# Patient Record
Sex: Male | Born: 1950
Health system: Southern US, Community
[De-identification: ages and names within clinical notes are randomized; demographics above are authoritative.]

## PROBLEM LIST (undated history)

## (undated) DIAGNOSIS — I10 Essential (primary) hypertension: Secondary | ICD-10-CM

## (undated) DIAGNOSIS — G4733 Obstructive sleep apnea (adult) (pediatric): Secondary | ICD-10-CM

## (undated) DIAGNOSIS — E785 Hyperlipidemia, unspecified: Secondary | ICD-10-CM

## (undated) DIAGNOSIS — I2699 Other pulmonary embolism without acute cor pulmonale: Secondary | ICD-10-CM

## (undated) DIAGNOSIS — Z8489 Family history of other specified conditions: Secondary | ICD-10-CM

## (undated) DIAGNOSIS — I499 Cardiac arrhythmia, unspecified: Secondary | ICD-10-CM

## (undated) DIAGNOSIS — C801 Malignant (primary) neoplasm, unspecified: Secondary | ICD-10-CM

## (undated) DIAGNOSIS — M47816 Spondylosis without myelopathy or radiculopathy, lumbar region: Secondary | ICD-10-CM

## (undated) DIAGNOSIS — H409 Unspecified glaucoma: Secondary | ICD-10-CM

## (undated) DIAGNOSIS — R7301 Impaired fasting glucose: Secondary | ICD-10-CM

## (undated) DIAGNOSIS — E7800 Pure hypercholesterolemia, unspecified: Secondary | ICD-10-CM

## (undated) DIAGNOSIS — I82409 Acute embolism and thrombosis of unspecified deep veins of unspecified lower extremity: Secondary | ICD-10-CM

## (undated) HISTORY — DX: Impaired fasting glucose: R73.01

## (undated) HISTORY — DX: Acute embolism and thrombosis of unspecified deep veins of unspecified lower extremity: I82.409

## (undated) HISTORY — DX: Spondylosis without myelopathy or radiculopathy, lumbar region: M47.816

## (undated) HISTORY — DX: Unspecified glaucoma: H40.9

## (undated) HISTORY — DX: Hyperlipidemia, unspecified: E78.5

## (undated) HISTORY — DX: Obstructive sleep apnea (adult) (pediatric): G47.33

## (undated) HISTORY — DX: Other pulmonary embolism without acute cor pulmonale: I26.99

## (undated) HISTORY — PX: KNEE SURGERY: SHX244

---

## 1994-10-16 HISTORY — PX: TOTAL KNEE ARTHROPLASTY: SHX125

## 2000-11-16 ENCOUNTER — Encounter: Admission: RE | Admit: 2000-11-16 | Discharge: 2000-11-16 | Payer: Self-pay | Admitting: Family Medicine

## 2000-11-16 ENCOUNTER — Encounter: Payer: Self-pay | Admitting: Family Medicine

## 2000-12-27 ENCOUNTER — Encounter: Payer: Self-pay | Admitting: Family Medicine

## 2000-12-27 ENCOUNTER — Encounter: Admission: RE | Admit: 2000-12-27 | Discharge: 2000-12-27 | Payer: Self-pay | Admitting: Family Medicine

## 2003-11-27 ENCOUNTER — Ambulatory Visit (HOSPITAL_COMMUNITY): Admission: RE | Admit: 2003-11-27 | Discharge: 2003-11-27 | Payer: Self-pay | Admitting: Family Medicine

## 2005-07-13 ENCOUNTER — Inpatient Hospital Stay (HOSPITAL_COMMUNITY): Admission: EM | Admit: 2005-07-13 | Discharge: 2005-07-17 | Payer: Self-pay | Admitting: Emergency Medicine

## 2005-07-13 ENCOUNTER — Encounter (INDEPENDENT_AMBULATORY_CARE_PROVIDER_SITE_OTHER): Payer: Self-pay | Admitting: *Deleted

## 2007-03-17 ENCOUNTER — Emergency Department (HOSPITAL_COMMUNITY): Admission: EM | Admit: 2007-03-17 | Discharge: 2007-03-17 | Payer: Self-pay | Admitting: Family Medicine

## 2011-03-03 NOTE — Discharge Summary (Signed)
NAMECAYCE, QUEZADA                 ACCOUNT NO.:  [id number]   MEDICAL RECORD NO.:  [id number]          PATIENT TYPE:  INP   LOCATION:  3009                         FACILITY:  [facility]   PHYSICIAN:  Corinna L. Lendell Caprice, MDDATE OF BIRTH:  04-17-1951   DATE OF ADMISSION:  07/13/2005  DATE OF DISCHARGE:  07/17/2005                                 DISCHARGE SUMMARY   DIAGNOSES:  1.  Acute bilateral pulmonary emboli.  2.  History of deep vein thrombosis.  3.  Osteoarthritis of the knees.   DISCHARGE MEDICATIONS:  1.  Lovenox 150 mg subcutaneously daily until his INR is greater than or      equal to 2.0.  2.  Coumadin 5 mg a day.  He is to followup with Donia Guiles, M.D. within      a week, and he will need daily INRs until his INR is greater than or      equal to 2.0. His INR today is 1.9.  Coumadin management per Dr.      Arvilla Market.   ACTIVITY:  No heavy exertion for two weeks.   CONDITION:  Stable.   CONSULTATIONS:  None.   PROCEDURES:  None.   DIET:  Coumadin friendly.   PERTINENT LABORATORY DATA:  On admission, CBC, PT, and PTT were normal.  Basic metabolic panel normal except for a glucose of 160.  At discharge, his  INR was 1.9.  His D-dimer was 7.61.  Lupus anticoagulant not detected.  PSA  1.06. Liver function tests essentially normal.  Anticardiolipin antibody  negative.  Homocystine normal.  Factor V Leiden is pending.   SPECIAL STUDIES IN RADIOLOGY:  A CT of the chest showed extensive bilateral  pulmonary emboli with right heart strain, suspicion for early right upper  lobe infarct. Chest x-ray showed borderline cardiomegaly, nothing acute.  Echocardiogram showed normal left ventricular ejection fraction but  dilatation of the right ventricle and reduced right ventricular systolic  function with elevated estimated peak right ventricular systolic pressure of  55 mmHg, mild-to-moderate tricuspid regurgitation, and dilated right atrium.  The EKG showed sinus  tachycardia.   HISTORY AND HOSPITAL COURSE:  [name] is a 60 year old white male who had a  syncopal episode. Please see H&P for complete details. He was hypoxic, and  per his wife.  He did not appear to be breathing for several seconds at a  time when she was waiting for EMS.  His oxygen saturation was 80%, I  believe, on non-rebreather in the emergency room initially;  however, this  came up eventually.  He had some moderate respiratory distress but had no  wheezes, rhonchi, or rales. He was tachycardic.  The CT of the chest showed  right heart strain and large bilateral pulmonary emboli.  The ER physician  spoke with critical care medicine.  They did not recommend thrombolytics as  he was able to oxygenate, and his vital signs were fairly stable. He was  admitted to the intensive care unit. started on heparin drip and Coumadin,  and given oxygen.  His hypoxia resolved fairly quickly. He was  back to his  baseline. He had a history of DVT in the past and did not have any recent  risk factors for pulmonary embolus. Upon further questioning, he reports  that his sister and niece have factor V Leiden deficiency and/or protein C  deficiency.  His hypercoagulable workup has been negative to date, but a  factor V Leiden was not ordered. Also, I did not order protein C, protein S,  or antithrombin-III levels because he had already been started on heparin,  and results would not be reliable. He reported that he had not had a  screening colonoscopy.  That would be another thing to consider once his PEs  resolve to rule out occult malignancy.  His PSA here was normal. As he had  this second thrombotic event, he will need to be on life-long Coumadin  today. His Coumadin and near therapeutic range. He is very anxious to go  home. He knows how to inject Lovenox from his previous DVT. As his vital  signs are stable and as he is ambulating without difficulty and back to his  baseline, I feel that it is  fine to let him go home which he is requesting.  I have discussed the case with Dr. Arvilla Market so that he may have close  followup, particularly of his daily INRs and Coumadin doses.      Corinna L. Lendell Caprice, MD  Electronically Signed     CLS/MEDQ  D:  07/17/2005  T:  07/17/2005  Job:  [id number]   cc:   Donia Guiles, M.D.  Fax: [phone]

## 2011-03-03 NOTE — H&P (Signed)
NAMECOLSEN, MODI                 ACCOUNT NO.:  [id number]   MEDICAL RECORD NO.:  [id number]          PATIENT TYPE:  INP   LOCATION:  2912                         FACILITY:  [facility]   PHYSICIAN:  Corinna L. Lendell Caprice, MDDATE OF BIRTH:  Feb 22, 1951   DATE OF ADMISSION:  07/13/2005  DATE OF DISCHARGE:                                HISTORY & PHYSICAL   CHIEF COMPLAINT:  Passed out.   HISTORY OF PRESENT ILLNESS:  Mr. Manetta is a 60 year old white male with a  history of previous deep venous thrombosis who presents to the emergency  room with a syncopal episode.  Apparently it lasted several minutes.  His  wife called 911 and he began waking up when EMS arrived.  The wife reports  that he had periods of apnea while she was waiting for EMS.  Patient was  hypoxic with saturations in the 80s.  She reports that he was pale and  blue.  He does complain of shortness of breath.  He has no chest pain.  He  reports that he has had some dyspnea on exertion over the past several  weeks.  A few months ago he started having leg pains, but reports it went  away with exercise.  His legs were also swollen.  He was not concerned,  however, because he felt this was related to his knee problems.  He had deep  venous thrombosis a few years ago and received six months of Coumadin.  He  initially reported that there is no family history of thromboembolism or  clotting disorder; however, after the encounter was completed, he reported  that his niece and aunt have clotting problems, specifically protein C  deficiency and factor V Leiden deficiency.  He denies any bleeding ulcers.  He has not been screened for colon or prostate cancer.  He does not smoke.  No recent prolonged car or plane trips.  No recent immobilization.   PAST MEDICAL HISTORY:  As above and also osteoarthritis of the knee for  which he has had surgery.   MEDICATIONS:  None.   ALLERGIES:  PENICILLIN.   SOCIAL HISTORY:  Patient drinks  occasionally.  He does not smoke.  He is  here with his wife.  He is retired and owns a gym.   FAMILY HISTORY:  As above.   REVIEW OF SYSTEMS:  As above, otherwise negative.   PHYSICAL EXAMINATION:  VITAL SIGNS:  Temperature 97.1, blood pressure 99/60,  pulse 114, respiratory rate 24, oxygen saturation 80% I believe on  nonrebreather when he first came in.  However, he is 100% on nonrebreather  currently.  GENERAL:  Patient is able to speak in short sentences only.  He appears in  moderate respiratory distress.  HEENT:  Normocephalic, atraumatic.  Pupils are equal, round, and reactive to  light.  Sclerae non-icteric.  Moist mucous membranes.  He does have central  cyanosis when he takes off his oxygen mask.  NECK:  Supple.  No carotid bruits.  LUNGS:  Clear to auscultation bilaterally without wheezes, rhonchi, or  rales.  CARDIOVASCULAR:  Regular rate  and rhythm without murmurs, rubs, or gallops.  ABDOMEN:  Normal bowel sounds, soft, nontender, nondistended.  GENITOURINARY:  Deferred.  RECTAL:  Deferred.  EXTREMITIES:  No clubbing, cyanosis, edema.  Homan's sign negative.  No calf  tenderness.  No cords.  NEUROLOGIC:  Patient is alert and oriented.  Cranial nerves and sensory  motor examination were intact.  SKIN:  No rash.   LABORATORIES:  pH was 7.429, pCO2 was 30, bicarbonate 20.  CBC was  unremarkable.  PT/PTT normal.  D-dimer 7.61.  Basic metabolic panel  significant for a glucose of 160, otherwise unremarkable.  EKG shows sinus  tachycardia with a rate of 110.  Chest x-ray shows nothing acute.  CAT scan  of the chest shows extensive bilateral pulmonary emboli with evidence of  right heart strain, possibly early infarct in the right upper lobe,  pulmonary emboli involving the main pulmonary arteries greater on the right  than the left, extension into all lobar branches on the right, less severe  extension into the lobar and segmental branches on the left, suspicion for   right heart strain with intraventricular septal deviation leftward.   ASSESSMENT/PLAN:  Massive bilateral pulmonary emboli with significant  hypoxia.  Currently patient is oxygenating better and does not need to be  intubated.  Dr. Denton Lank discussed the case with Dr. Solon Augusta of critical care.  He reports that as long as he is able to oxygenate TPA is not needed.  Due  to the significant hypoxia and extensive nature of the emboli, patient will  be admitted to the intensive care unit.  I will continue oxygen.  He has  been started on a heparin drip which I will continue.  I will ask pharmacy  to dose Coumadin.  He will need lifelong Coumadin therapy as this is his  second thrombotic event.  Since heparin has already been started I will not  order protein C, protein S, or antithrombin III levels.  However, I will  order a homocysteine level, factor V Leiden, antiphospholipid antibodies,  and PCR for D2O210A prothrombin gene mutation.  The ER physician has ordered  an echocardiogram due to the right heart strain.  The results are pending.  Patient also will need a colonoscopy as an outpatient for screening.  While  here I will check a PSA and Hemoccult stools as well.      Corinna L. Lendell Caprice, MD  Electronically Signed     CLS/MEDQ  D:  07/14/2005  T:  07/14/2005  Job:  [id number]   cc:   Donia Guiles, M.D.  Fax: [phone]

## 2013-12-16 ENCOUNTER — Encounter: Payer: Self-pay | Admitting: General Surgery

## 2013-12-16 DIAGNOSIS — R0602 Shortness of breath: Secondary | ICD-10-CM | POA: Insufficient documentation

## 2013-12-16 DIAGNOSIS — G4733 Obstructive sleep apnea (adult) (pediatric): Secondary | ICD-10-CM | POA: Insufficient documentation

## 2013-12-17 ENCOUNTER — Ambulatory Visit (INDEPENDENT_AMBULATORY_CARE_PROVIDER_SITE_OTHER): Payer: 59 | Admitting: Cardiology

## 2013-12-17 ENCOUNTER — Encounter: Payer: Self-pay | Admitting: Cardiology

## 2013-12-17 VITALS — BP 132/82 | HR 79 | Ht 70.0 in | Wt 244.0 lb

## 2013-12-17 DIAGNOSIS — R0602 Shortness of breath: Secondary | ICD-10-CM

## 2013-12-17 DIAGNOSIS — G4733 Obstructive sleep apnea (adult) (pediatric): Secondary | ICD-10-CM

## 2013-12-17 NOTE — Patient Instructions (Signed)
Your physician recommends that you continue on your current medications as directed. Please refer to the Current Medication list given to you today.  Your physician wants you to follow-up in: 6 months with Dr Turner You will receive a reminder letter in the mail two months in advance. If you don't receive a letter, please call our office to schedule the follow-up appointment.  

## 2013-12-17 NOTE — Progress Notes (Signed)
[place], [address] [place], [place]  [address] Phone: [phone] Fax:  [phone]  Date:  12/17/2013   ID:  Jacody, Beneke 04/01/1951, MRN [id number]  PCP:  Mayra Neer, MD  Sleep Medicine:  Fransico Him, MD     History of Present Illness: Patrick Fritz is a 63 y.o. male with a history of OSA and obesity who presents today for followup.  He is doing well.  He tolerates his CPAP device without any problems.  He feels rested in the am and has no daytime sleepiness.  He tolerates his full face mask and feels the pressure is adequate.  When I saw him last he was complaining of SOB and a 2D echo was done which showed normal LVF, mild MR, mildly dilated aortic root and nuclear stress test showed no ischemia. He recently had a cold so he did not use his CPAP for a few nights.   Wt Readings from Last 3 Encounters:  12/17/13 244 lb (110.678 kg)     Past Medical History  Diagnosis Date  . DVT (deep venous thrombosis)   . Hyperlipidemia   . PE (pulmonary embolism)     Second occurance,enknown etiology,lifelong anticoagulation  . IFG (impaired fasting glucose)   . Glaucoma     R eye diminished vision approx 50%  . OSA (obstructive sleep apnea)     Severe w AHI 34/hr on HST no on BiPAP at 19/15cm H2O  . DJD (degenerative joint disease), lumbar     Current Outpatient Prescriptions  Medication Sig Dispense Refill  . amoxicillin (AMOXIL) 500 MG capsule       . cetirizine (ZYRTEC) 10 MG tablet Take 10 mg by mouth daily.      . cholecalciferol (VITAMIN D) 1000 UNITS tablet Take 1,000 Units by mouth daily.      . NON FORMULARY BiPAP      . Omega-3 Fatty Acids (FISH OIL) 1000 MG CAPS Take 1 capsule by mouth 2 (two) times daily.      . Rivaroxaban (XARELTO) 20 MG TABS tablet Take 20 mg by mouth daily with supper.      . sildenafil (VIAGRA) 100 MG tablet Take 100 mg by mouth daily as needed for erectile dysfunction.      . simvastatin (ZOCOR) 20 MG tablet Take 20 mg by mouth  daily.      . VOLTAREN 1 % GEL        No current facility-administered medications for this visit.    Allergies:    Allergies  Allergen Reactions  . Penicillins     Throat Swelling    Social History:  The patient  reports that he has never smoked. He does not have any smokeless tobacco history on file. He reports that he drinks alcohol.   Family History:  The patient's family history includes CAD in his father; Hypertension in his father; Osteoporosis in his mother.   ROS:  Please see the history of present illness.      All other systems reviewed and negative.   PHYSICAL EXAM: VS:  BP 132/82  Pulse 79  Ht 5\' 10"  (1.778 m)  Wt 244 lb (110.678 kg)  BMI 35.01 kg/m2  SpO2 99% Well nourished, well developed, in no acute distress HEENT: normal Neck: no JVD Cardiac:  normal S1, S2; RRR; no murmur Lungs:  clear to auscultation bilaterally, no wheezing, rhonchi or rales Abd: soft, nontender, no hepatomegaly Ext: no edema Skin: warm and dry Neuro:  CNs 2-12 intact, no focal abnormalities noted       ASSESSMENT AND PLAN:  1. OSA on CPAP and tolerating well  - I will get a download from his DME 2. Obesity - I have encouarged him to try to get into a routine walking program  exercise and strict diet to try to lose some weight. 3. Chronic SOB with normal cardiac workup most likely secondary to morbid obesity  Followup with me in 6 months  Signed, Fransico Him, MD 12/17/2013 10:51 AM

## 2014-01-12 ENCOUNTER — Encounter: Payer: Self-pay | Admitting: Cardiology

## 2014-01-12 ENCOUNTER — Encounter: Payer: Self-pay | Admitting: General Surgery

## 2014-02-13 DIAGNOSIS — H401290 Low-tension glaucoma, unspecified eye, stage unspecified: Secondary | ICD-10-CM | POA: Diagnosis not present

## 2014-03-16 DIAGNOSIS — W57XXXA Bitten or stung by nonvenomous insect and other nonvenomous arthropods, initial encounter: Secondary | ICD-10-CM | POA: Diagnosis not present

## 2014-03-16 DIAGNOSIS — Z6836 Body mass index (BMI) 36.0-36.9, adult: Secondary | ICD-10-CM | POA: Diagnosis not present

## 2014-03-16 DIAGNOSIS — Z23 Encounter for immunization: Secondary | ICD-10-CM | POA: Diagnosis not present

## 2014-03-16 DIAGNOSIS — N529 Male erectile dysfunction, unspecified: Secondary | ICD-10-CM | POA: Diagnosis not present

## 2014-03-16 DIAGNOSIS — Z125 Encounter for screening for malignant neoplasm of prostate: Secondary | ICD-10-CM | POA: Diagnosis not present

## 2014-03-16 DIAGNOSIS — R7301 Impaired fasting glucose: Secondary | ICD-10-CM | POA: Diagnosis not present

## 2014-03-16 DIAGNOSIS — R030 Elevated blood-pressure reading, without diagnosis of hypertension: Secondary | ICD-10-CM | POA: Diagnosis not present

## 2014-03-16 DIAGNOSIS — M25519 Pain in unspecified shoulder: Secondary | ICD-10-CM | POA: Diagnosis not present

## 2014-03-16 DIAGNOSIS — T148 Other injury of unspecified body region: Secondary | ICD-10-CM | POA: Diagnosis not present

## 2014-03-16 DIAGNOSIS — Z0000 Encounter for general adult medical examination without abnormal findings: Secondary | ICD-10-CM | POA: Diagnosis not present

## 2014-03-16 DIAGNOSIS — E782 Mixed hyperlipidemia: Secondary | ICD-10-CM | POA: Diagnosis not present

## 2014-03-17 ENCOUNTER — Other Ambulatory Visit: Payer: Self-pay | Admitting: Family Medicine

## 2014-03-17 DIAGNOSIS — Z139 Encounter for screening, unspecified: Secondary | ICD-10-CM

## 2014-03-19 ENCOUNTER — Ambulatory Visit
Admission: RE | Admit: 2014-03-19 | Discharge: 2014-03-19 | Disposition: A | Discharge status: Home / Self Care | Payer: Medicare Other | Source: Ambulatory Visit | Attending: Family Medicine | Admitting: Family Medicine

## 2014-03-19 DIAGNOSIS — Z139 Encounter for screening, unspecified: Secondary | ICD-10-CM

## 2014-03-19 DIAGNOSIS — Z136 Encounter for screening for cardiovascular disorders: Secondary | ICD-10-CM | POA: Diagnosis not present

## 2014-05-08 DIAGNOSIS — Z86010 Personal history of colonic polyps: Secondary | ICD-10-CM | POA: Diagnosis not present

## 2014-05-08 DIAGNOSIS — Z09 Encounter for follow-up examination after completed treatment for conditions other than malignant neoplasm: Secondary | ICD-10-CM | POA: Diagnosis not present

## 2014-05-19 DIAGNOSIS — H401290 Low-tension glaucoma, unspecified eye, stage unspecified: Secondary | ICD-10-CM | POA: Diagnosis not present

## 2014-08-25 DIAGNOSIS — H401232 Low-tension glaucoma, bilateral, moderate stage: Secondary | ICD-10-CM | POA: Diagnosis not present

## 2014-09-01 DIAGNOSIS — H1013 Acute atopic conjunctivitis, bilateral: Secondary | ICD-10-CM | POA: Diagnosis not present

## 2014-12-08 DIAGNOSIS — H401232 Low-tension glaucoma, bilateral, moderate stage: Secondary | ICD-10-CM | POA: Diagnosis not present

## 2015-03-09 DIAGNOSIS — H401231 Low-tension glaucoma, bilateral, mild stage: Secondary | ICD-10-CM | POA: Diagnosis not present

## 2015-04-14 DIAGNOSIS — M129 Arthropathy, unspecified: Secondary | ICD-10-CM | POA: Diagnosis not present

## 2015-04-14 DIAGNOSIS — J301 Allergic rhinitis due to pollen: Secondary | ICD-10-CM | POA: Diagnosis not present

## 2015-04-14 DIAGNOSIS — Z86711 Personal history of pulmonary embolism: Secondary | ICD-10-CM | POA: Diagnosis not present

## 2015-04-14 DIAGNOSIS — N529 Male erectile dysfunction, unspecified: Secondary | ICD-10-CM | POA: Diagnosis not present

## 2015-04-14 DIAGNOSIS — Z6835 Body mass index (BMI) 35.0-35.9, adult: Secondary | ICD-10-CM | POA: Diagnosis not present

## 2015-04-14 DIAGNOSIS — E780 Pure hypercholesterolemia: Secondary | ICD-10-CM | POA: Diagnosis not present

## 2015-04-14 DIAGNOSIS — Z125 Encounter for screening for malignant neoplasm of prostate: Secondary | ICD-10-CM | POA: Diagnosis not present

## 2015-04-14 DIAGNOSIS — G4733 Obstructive sleep apnea (adult) (pediatric): Secondary | ICD-10-CM | POA: Diagnosis not present

## 2015-04-14 DIAGNOSIS — R7301 Impaired fasting glucose: Secondary | ICD-10-CM | POA: Diagnosis not present

## 2015-04-14 DIAGNOSIS — Z0001 Encounter for general adult medical examination with abnormal findings: Secondary | ICD-10-CM | POA: Diagnosis not present

## 2015-04-22 DIAGNOSIS — R5383 Other fatigue: Secondary | ICD-10-CM | POA: Diagnosis not present

## 2015-04-22 DIAGNOSIS — M19042 Primary osteoarthritis, left hand: Secondary | ICD-10-CM | POA: Diagnosis not present

## 2015-04-22 DIAGNOSIS — M79643 Pain in unspecified hand: Secondary | ICD-10-CM | POA: Diagnosis not present

## 2015-04-22 DIAGNOSIS — M19041 Primary osteoarthritis, right hand: Secondary | ICD-10-CM | POA: Diagnosis not present

## 2015-04-22 DIAGNOSIS — R760 Raised antibody titer: Secondary | ICD-10-CM | POA: Diagnosis not present

## 2015-05-18 DIAGNOSIS — E790 Hyperuricemia without signs of inflammatory arthritis and tophaceous disease: Secondary | ICD-10-CM | POA: Diagnosis not present

## 2015-05-18 DIAGNOSIS — M79643 Pain in unspecified hand: Secondary | ICD-10-CM | POA: Diagnosis not present

## 2015-05-18 DIAGNOSIS — M351 Other overlap syndromes: Secondary | ICD-10-CM | POA: Diagnosis not present

## 2015-06-08 DIAGNOSIS — H401231 Low-tension glaucoma, bilateral, mild stage: Secondary | ICD-10-CM | POA: Diagnosis not present

## 2015-08-18 DIAGNOSIS — M79643 Pain in unspecified hand: Secondary | ICD-10-CM | POA: Diagnosis not present

## 2015-08-18 DIAGNOSIS — M130 Polyarthritis, unspecified: Secondary | ICD-10-CM | POA: Diagnosis not present

## 2015-08-18 DIAGNOSIS — M351 Other overlap syndromes: Secondary | ICD-10-CM | POA: Diagnosis not present

## 2015-08-18 DIAGNOSIS — E790 Hyperuricemia without signs of inflammatory arthritis and tophaceous disease: Secondary | ICD-10-CM | POA: Diagnosis not present

## 2015-08-31 DIAGNOSIS — H401231 Low-tension glaucoma, bilateral, mild stage: Secondary | ICD-10-CM | POA: Diagnosis not present

## 2015-09-17 DIAGNOSIS — M79643 Pain in unspecified hand: Secondary | ICD-10-CM | POA: Diagnosis not present

## 2015-09-17 DIAGNOSIS — M351 Other overlap syndromes: Secondary | ICD-10-CM | POA: Diagnosis not present

## 2015-09-17 DIAGNOSIS — E790 Hyperuricemia without signs of inflammatory arthritis and tophaceous disease: Secondary | ICD-10-CM | POA: Diagnosis not present

## 2015-09-17 DIAGNOSIS — M130 Polyarthritis, unspecified: Secondary | ICD-10-CM | POA: Diagnosis not present

## 2015-11-30 DIAGNOSIS — H401231 Low-tension glaucoma, bilateral, mild stage: Secondary | ICD-10-CM | POA: Diagnosis not present

## 2016-02-22 DIAGNOSIS — H401231 Low-tension glaucoma, bilateral, mild stage: Secondary | ICD-10-CM | POA: Diagnosis not present

## 2016-03-17 DIAGNOSIS — E790 Hyperuricemia without signs of inflammatory arthritis and tophaceous disease: Secondary | ICD-10-CM | POA: Diagnosis not present

## 2016-03-17 DIAGNOSIS — M79643 Pain in unspecified hand: Secondary | ICD-10-CM | POA: Diagnosis not present

## 2016-03-17 DIAGNOSIS — M130 Polyarthritis, unspecified: Secondary | ICD-10-CM | POA: Diagnosis not present

## 2016-03-17 DIAGNOSIS — M351 Other overlap syndromes: Secondary | ICD-10-CM | POA: Diagnosis not present

## 2016-05-14 ENCOUNTER — Other Ambulatory Visit (HOSPITAL_COMMUNITY): Payer: Self-pay

## 2016-05-14 ENCOUNTER — Emergency Department (HOSPITAL_COMMUNITY): Payer: Medicare Other | Admitting: Anesthesiology

## 2016-05-14 ENCOUNTER — Encounter (HOSPITAL_COMMUNITY): Payer: Self-pay

## 2016-05-14 ENCOUNTER — Encounter (HOSPITAL_COMMUNITY)
Admission: EM | Disposition: A | Discharge status: Home / Self Care | Payer: Self-pay | Source: Home / Self Care | Attending: Cardiothoracic Surgery

## 2016-05-14 ENCOUNTER — Inpatient Hospital Stay (HOSPITAL_COMMUNITY)
Admission: EM | Admit: 2016-05-14 | Discharge: 2016-05-23 | DRG: 231 | Disposition: A | Discharge status: Home / Self Care | Payer: Medicare Other | Attending: Cardiothoracic Surgery | Admitting: Cardiothoracic Surgery

## 2016-05-14 ENCOUNTER — Emergency Department (HOSPITAL_COMMUNITY): Payer: Medicare Other

## 2016-05-14 DIAGNOSIS — E785 Hyperlipidemia, unspecified: Secondary | ICD-10-CM | POA: Diagnosis not present

## 2016-05-14 DIAGNOSIS — Z7901 Long term (current) use of anticoagulants: Secondary | ICD-10-CM

## 2016-05-14 DIAGNOSIS — I2542 Coronary artery dissection: Secondary | ICD-10-CM | POA: Diagnosis present

## 2016-05-14 DIAGNOSIS — I110 Hypertensive heart disease with heart failure: Secondary | ICD-10-CM | POA: Diagnosis present

## 2016-05-14 DIAGNOSIS — I213 ST elevation (STEMI) myocardial infarction of unspecified site: Secondary | ICD-10-CM | POA: Diagnosis not present

## 2016-05-14 DIAGNOSIS — J939 Pneumothorax, unspecified: Secondary | ICD-10-CM

## 2016-05-14 DIAGNOSIS — D696 Thrombocytopenia, unspecified: Secondary | ICD-10-CM | POA: Diagnosis not present

## 2016-05-14 DIAGNOSIS — D62 Acute posthemorrhagic anemia: Secondary | ICD-10-CM | POA: Diagnosis not present

## 2016-05-14 DIAGNOSIS — Y840 Cardiac catheterization as the cause of abnormal reaction of the patient, or of later complication, without mention of misadventure at the time of the procedure: Secondary | ICD-10-CM | POA: Diagnosis not present

## 2016-05-14 DIAGNOSIS — I7101 Dissection of thoracic aorta: Secondary | ICD-10-CM | POA: Diagnosis not present

## 2016-05-14 DIAGNOSIS — I7100 Dissection of unspecified site of aorta: Secondary | ICD-10-CM

## 2016-05-14 DIAGNOSIS — I2111 ST elevation (STEMI) myocardial infarction involving right coronary artery: Secondary | ICD-10-CM

## 2016-05-14 DIAGNOSIS — I9751 Accidental puncture and laceration of a circulatory system organ or structure during a circulatory system procedure: Secondary | ICD-10-CM | POA: Diagnosis not present

## 2016-05-14 DIAGNOSIS — E662 Morbid (severe) obesity with alveolar hypoventilation: Secondary | ICD-10-CM | POA: Diagnosis not present

## 2016-05-14 DIAGNOSIS — Z95828 Presence of other vascular implants and grafts: Secondary | ICD-10-CM

## 2016-05-14 DIAGNOSIS — Z6837 Body mass index (BMI) 37.0-37.9, adult: Secondary | ICD-10-CM | POA: Diagnosis not present

## 2016-05-14 DIAGNOSIS — R0789 Other chest pain: Secondary | ICD-10-CM | POA: Diagnosis not present

## 2016-05-14 DIAGNOSIS — I5043 Acute on chronic combined systolic (congestive) and diastolic (congestive) heart failure: Secondary | ICD-10-CM | POA: Diagnosis present

## 2016-05-14 DIAGNOSIS — J9811 Atelectasis: Secondary | ICD-10-CM

## 2016-05-14 DIAGNOSIS — Z86718 Personal history of other venous thrombosis and embolism: Secondary | ICD-10-CM

## 2016-05-14 DIAGNOSIS — M069 Rheumatoid arthritis, unspecified: Secondary | ICD-10-CM | POA: Diagnosis present

## 2016-05-14 DIAGNOSIS — Z951 Presence of aortocoronary bypass graft: Secondary | ICD-10-CM

## 2016-05-14 DIAGNOSIS — Z86711 Personal history of pulmonary embolism: Secondary | ICD-10-CM

## 2016-05-14 DIAGNOSIS — I2510 Atherosclerotic heart disease of native coronary artery without angina pectoris: Secondary | ICD-10-CM | POA: Diagnosis present

## 2016-05-14 DIAGNOSIS — I2119 ST elevation (STEMI) myocardial infarction involving other coronary artery of inferior wall: Secondary | ICD-10-CM | POA: Diagnosis not present

## 2016-05-14 DIAGNOSIS — I25110 Atherosclerotic heart disease of native coronary artery with unstable angina pectoris: Secondary | ICD-10-CM | POA: Diagnosis not present

## 2016-05-14 DIAGNOSIS — I10 Essential (primary) hypertension: Secondary | ICD-10-CM | POA: Diagnosis not present

## 2016-05-14 DIAGNOSIS — I080 Rheumatic disorders of both mitral and aortic valves: Secondary | ICD-10-CM | POA: Diagnosis not present

## 2016-05-14 DIAGNOSIS — I252 Old myocardial infarction: Secondary | ICD-10-CM

## 2016-05-14 DIAGNOSIS — R079 Chest pain, unspecified: Secondary | ICD-10-CM

## 2016-05-14 DIAGNOSIS — R0602 Shortness of breath: Secondary | ICD-10-CM

## 2016-05-14 DIAGNOSIS — H409 Unspecified glaucoma: Secondary | ICD-10-CM | POA: Diagnosis not present

## 2016-05-14 DIAGNOSIS — I719 Aortic aneurysm of unspecified site, without rupture: Secondary | ICD-10-CM | POA: Diagnosis not present

## 2016-05-14 DIAGNOSIS — I229 Subsequent ST elevation (STEMI) myocardial infarction of unspecified site: Secondary | ICD-10-CM | POA: Diagnosis not present

## 2016-05-14 HISTORY — DX: Essential (primary) hypertension: I10

## 2016-05-14 HISTORY — PX: CORONARY ARTERY BYPASS GRAFT: SHX141

## 2016-05-14 HISTORY — DX: Pure hypercholesterolemia, unspecified: E78.00

## 2016-05-14 HISTORY — PX: CARDIAC CATHETERIZATION: SHX172

## 2016-05-14 LAB — CBC
HEMATOCRIT: 40.9 % (ref 39.0–52.0)
Hemoglobin: 13.8 g/dL (ref 13.0–17.0)
MCH: 29.7 pg (ref 26.0–34.0)
MCHC: 33.7 g/dL (ref 30.0–36.0)
MCV: 88 fL (ref 78.0–100.0)
Platelets: 196 10*3/uL (ref 150–400)
RBC: 4.65 MIL/uL (ref 4.22–5.81)
RDW: 12.6 % (ref 11.5–15.5)
WBC: 8.2 10*3/uL (ref 4.0–10.5)

## 2016-05-14 LAB — FIBRINOGEN: Fibrinogen: 193 mg/dL — ABNORMAL LOW (ref 210–475)

## 2016-05-14 LAB — LIPID PANEL
CHOL/HDL RATIO: 4 ratio
Cholesterol: 179 mg/dL (ref 0–200)
HDL: 45 mg/dL (ref >40)
LDL Cholesterol: 101 mg/dL — ABNORMAL HIGH (ref 0–99)
Triglycerides: 165 mg/dL — ABNORMAL HIGH (ref <150)
VLDL: 33 mg/dL (ref 0–40)

## 2016-05-14 LAB — COMPREHENSIVE METABOLIC PANEL
ALBUMIN: 3.9 g/dL (ref 3.5–5.0)
ALK PHOS: 61 U/L (ref 38–126)
ALT: 18 U/L (ref 17–63)
ANION GAP: 7 (ref 5–15)
AST: 25 U/L (ref 15–41)
BUN: 12 mg/dL (ref 6–20)
CALCIUM: 9.1 mg/dL (ref 8.9–10.3)
CHLORIDE: 107 mmol/L (ref 101–111)
CO2: 25 mmol/L (ref 22–32)
Creatinine, Ser: 1.02 mg/dL (ref 0.61–1.24)
GFR calc non Af Amer: >60 mL/min (ref >60)
GLUCOSE: 152 mg/dL — AB (ref 65–99)
Potassium: 3.8 mmol/L (ref 3.5–5.1)
SODIUM: 139 mmol/L (ref 135–145)
Total Bilirubin: 0.5 mg/dL (ref 0.3–1.2)
Total Protein: 6.5 g/dL (ref 6.5–8.1)

## 2016-05-14 LAB — PREPARE RBC (CROSSMATCH)

## 2016-05-14 LAB — PROTIME-INR
INR: 1
PROTHROMBIN TIME: 13.2 s (ref 11.4–15.2)

## 2016-05-14 LAB — CBG MONITORING, ED: GLUCOSE-CAPILLARY: 152 mg/dL — AB (ref 65–99)

## 2016-05-14 LAB — I-STAT TROPONIN, ED: TROPONIN I, POC: 0.13 ng/mL — AB (ref 0.00–0.08)

## 2016-05-14 LAB — ECHOCARDIOGRAM LIMITED
HEIGHTINCHES: 70 in
Weight: 3920 oz

## 2016-05-14 LAB — DIFFERENTIAL
BASOS ABS: 0 10*3/uL (ref 0.0–0.1)
BASOS PCT: 0 %
Eosinophils Absolute: 0.1 10*3/uL (ref 0.0–0.7)
Eosinophils Relative: 1 %
LYMPHS PCT: 20 %
Lymphs Abs: 1.6 10*3/uL (ref 0.7–4.0)
MONO ABS: 0.5 10*3/uL (ref 0.1–1.0)
MONOS PCT: 6 %
NEUTROS ABS: 6.1 10*3/uL (ref 1.7–7.7)
Neutrophils Relative %: 73 %

## 2016-05-14 LAB — TROPONIN I: Troponin I: 0.2 ng/mL (ref <0.03)

## 2016-05-14 LAB — I-STAT CHEM 8, ED
BUN: 14 mg/dL (ref 6–20)
CALCIUM ION: 1.15 mmol/L (ref 1.12–1.23)
CREATININE: 1 mg/dL (ref 0.61–1.24)
Chloride: 103 mmol/L (ref 101–111)
GLUCOSE: 138 mg/dL — AB (ref 65–99)
HCT: 42 % (ref 39.0–52.0)
HEMOGLOBIN: 14.3 g/dL (ref 13.0–17.0)
POTASSIUM: 3.9 mmol/L (ref 3.5–5.1)
Sodium: 142 mmol/L (ref 135–145)
TCO2: 25 mmol/L (ref 0–100)

## 2016-05-14 LAB — ABO/RH: ABO/RH(D): A POS

## 2016-05-14 LAB — HEMOGLOBIN AND HEMATOCRIT, BLOOD
HCT: 22.9 % — ABNORMAL LOW (ref 39.0–52.0)
Hemoglobin: 8 g/dL — ABNORMAL LOW (ref 13.0–17.0)

## 2016-05-14 LAB — APTT: APTT: 26 s (ref 24–36)

## 2016-05-14 LAB — PLATELET COUNT: Platelets: 112 10*3/uL — ABNORMAL LOW (ref 150–400)

## 2016-05-14 SURGERY — CORONARY ARTERY BYPASS GRAFTING (CABG)
Anesthesia: General | Site: Chest

## 2016-05-14 SURGERY — LEFT HEART CATH AND CORONARY ANGIOGRAPHY
Anesthesia: Moderate Sedation

## 2016-05-14 MED ORDER — PHENYLEPHRINE HCL 10 MG/ML IJ SOLN
30.0000 ug/min | INTRAVENOUS | Status: DC
  Filled 2016-05-14 (×2): qty 2

## 2016-05-14 MED ORDER — SODIUM CHLORIDE 0.9 % IV SOLN
10.0000 mL/h | INTRAVENOUS | Status: DC
  Administered 2016-05-14: 10 mL/h via INTRAVENOUS

## 2016-05-14 MED ORDER — ONDANSETRON HCL 4 MG/2ML IJ SOLN
INTRAMUSCULAR | Status: AC
  Filled 2016-05-14: qty 2

## 2016-05-14 MED ORDER — PROTAMINE SULFATE 10 MG/ML IV SOLN
INTRAVENOUS | Status: AC
  Filled 2016-05-14: qty 10

## 2016-05-14 MED ORDER — LIDOCAINE HCL (PF) 1 % IJ SOLN
INTRAMUSCULAR | Status: DC | PRN
  Administered 2016-05-14 (×2): 2 mL

## 2016-05-14 MED ORDER — PROPOFOL 10 MG/ML IV BOLUS
INTRAVENOUS | Status: AC
Start: 2016-05-14 — End: 2016-05-14
  Filled 2016-05-14: qty 20

## 2016-05-14 MED ORDER — ROCURONIUM BROMIDE 100 MG/10ML IV SOLN
INTRAVENOUS | Status: DC | PRN
  Administered 2016-05-14: 100 mg via INTRAVENOUS
  Administered 2016-05-14 – 2016-05-15 (×5): 50 mg via INTRAVENOUS

## 2016-05-14 MED ORDER — DOPAMINE-DEXTROSE 3.2-5 MG/ML-% IV SOLN
INTRAVENOUS | Status: DC | PRN
  Administered 2016-05-14: 10 ug/kg/min via INTRAVENOUS

## 2016-05-14 MED ORDER — LACTATED RINGERS IV SOLN
INTRAVENOUS | Status: DC | PRN
  Administered 2016-05-14 (×2): via INTRAVENOUS

## 2016-05-14 MED ORDER — NOREPINEPHRINE BITARTRATE 1 MG/ML IV SOLN
0.0000 ug/min | INTRAVENOUS | Status: DC
  Administered 2016-05-15: 6 ug/min via INTRAVENOUS
  Administered 2016-05-15: 5 ug/min via INTRAVENOUS
  Administered 2016-05-16: 6 ug/min via INTRAVENOUS
  Filled 2016-05-14 (×4): qty 4

## 2016-05-14 MED ORDER — LEVOFLOXACIN IN D5W 500 MG/100ML IV SOLN
500.0000 mg | INTRAVENOUS | Status: AC
  Administered 2016-05-14: 500 mg via INTRAVENOUS
  Filled 2016-05-14: qty 100

## 2016-05-14 MED ORDER — VERAPAMIL HCL 2.5 MG/ML IV SOLN
INTRAVENOUS | Status: AC
  Filled 2016-05-14: qty 2

## 2016-05-14 MED ORDER — VANCOMYCIN HCL 10 G IV SOLR
1250.0000 mg | INTRAVENOUS | Status: DC
  Administered 2016-05-14: 1250 mg via INTRAVENOUS
  Filled 2016-05-14: qty 1250

## 2016-05-14 MED ORDER — SODIUM CHLORIDE 0.9 % IV SOLN
INTRAVENOUS | Status: DC
  Filled 2016-05-14: qty 30

## 2016-05-14 MED ORDER — SODIUM CHLORIDE 0.9 % IV SOLN
INTRAVENOUS | Status: DC | PRN
  Administered 2016-05-14: 500 mL via INTRAVENOUS

## 2016-05-14 MED ORDER — SODIUM CHLORIDE 0.9 % IJ SOLN
INTRAMUSCULAR | Status: AC
  Filled 2016-05-14: qty 10

## 2016-05-14 MED ORDER — HEMOSTATIC AGENTS (NO CHARGE) OPTIME
TOPICAL | Status: DC | PRN
  Administered 2016-05-14: 5 via TOPICAL

## 2016-05-14 MED ORDER — BIVALIRUDIN BOLUS VIA INFUSION - CUPID
INTRAVENOUS | Status: DC | PRN
  Administered 2016-05-14: 83.325 mg via INTRAVENOUS

## 2016-05-14 MED ORDER — PLASMA-LYTE 148 IV SOLN
INTRAVENOUS | Status: AC
  Administered 2016-05-14: 500 mL
  Filled 2016-05-14: qty 2.5

## 2016-05-14 MED ORDER — SODIUM CHLORIDE 0.9 % IV SOLN
INTRAVENOUS | Status: AC
  Administered 2016-05-14: 70 mL/h via INTRAVENOUS
  Filled 2016-05-14: qty 40

## 2016-05-14 MED ORDER — POTASSIUM CHLORIDE 2 MEQ/ML IV SOLN
80.0000 meq | INTRAVENOUS | Status: DC
  Filled 2016-05-14: qty 40

## 2016-05-14 MED ORDER — EPHEDRINE 5 MG/ML INJ
INTRAVENOUS | Status: AC
  Filled 2016-05-14: qty 10

## 2016-05-14 MED ORDER — MIDAZOLAM HCL 2 MG/2ML IJ SOLN
INTRAMUSCULAR | Status: AC
  Filled 2016-05-14: qty 2

## 2016-05-14 MED ORDER — SODIUM CHLORIDE 0.9 % IV SOLN
INTRAVENOUS | Status: DC
  Filled 2016-05-14: qty 40

## 2016-05-14 MED ORDER — SODIUM CHLORIDE 0.9 % IV SOLN
INTRAVENOUS | Status: DC | PRN
  Administered 2016-05-14: 1.75 mg/kg/h via INTRAVENOUS

## 2016-05-14 MED ORDER — DEXMEDETOMIDINE HCL IN NACL 400 MCG/100ML IV SOLN
0.1000 ug/kg/h | INTRAVENOUS | Status: AC
  Administered 2016-05-14: .7 ug/kg/h via INTRAVENOUS
  Filled 2016-05-14 (×2): qty 100

## 2016-05-14 MED ORDER — DEXTROSE 5 % IV SOLN
0.0000 ug/min | INTRAVENOUS | Status: DC
  Filled 2016-05-14: qty 4

## 2016-05-14 MED ORDER — ARTIFICIAL TEARS OP OINT
TOPICAL_OINTMENT | OPHTHALMIC | Status: AC
  Filled 2016-05-14: qty 3.5

## 2016-05-14 MED ORDER — NITROGLYCERIN 1 MG/10 ML FOR IR/CATH LAB
INTRA_ARTERIAL | Status: AC
  Filled 2016-05-14: qty 10

## 2016-05-14 MED ORDER — MAGNESIUM SULFATE 50 % IJ SOLN
40.0000 meq | INTRAMUSCULAR | Status: DC
Start: 2016-05-14 — End: 2016-05-15
  Filled 2016-05-14: qty 10

## 2016-05-14 MED ORDER — HEPARIN (PORCINE) IN NACL 2-0.9 UNIT/ML-% IJ SOLN
INTRAMUSCULAR | Status: AC
  Filled 2016-05-14: qty 500

## 2016-05-14 MED ORDER — ROCURONIUM BROMIDE 50 MG/5ML IV SOLN
INTRAVENOUS | Status: AC
  Filled 2016-05-14: qty 7

## 2016-05-14 MED ORDER — SODIUM CHLORIDE 0.9 % IV SOLN
INTRAVENOUS | Status: AC
  Administered 2016-05-14: 1 [IU]/h via INTRAVENOUS
  Filled 2016-05-14: qty 2.5

## 2016-05-14 MED ORDER — HEPARIN (PORCINE) IN NACL 2-0.9 UNIT/ML-% IJ SOLN
INTRAMUSCULAR | Status: AC
  Filled 2016-05-14: qty 1000

## 2016-05-14 MED ORDER — LIDOCAINE HCL (CARDIAC) 20 MG/ML IV SOLN
INTRAVENOUS | Status: DC | PRN
  Administered 2016-05-14: 100 mg via INTRATRACHEAL

## 2016-05-14 MED ORDER — HEPARIN SODIUM (PORCINE) 5000 UNIT/ML IJ SOLN
4000.0000 [IU] | INTRAMUSCULAR | Status: AC
  Administered 2016-05-14: 4000 [IU] via INTRAVENOUS
  Filled 2016-05-14: qty 1

## 2016-05-14 MED ORDER — NOREPINEPHRINE BITARTRATE 1 MG/ML IV SOLN
INTRAVENOUS | Status: AC
  Filled 2016-05-14: qty 4

## 2016-05-14 MED ORDER — IOPAMIDOL (ISOVUE-370) INJECTION 76%
INTRAVENOUS | Status: DC | PRN
  Administered 2016-05-14: 145 mL via INTRA_ARTERIAL

## 2016-05-14 MED ORDER — BIVALIRUDIN 250 MG IV SOLR
INTRAVENOUS | Status: AC
  Filled 2016-05-14: qty 250

## 2016-05-14 MED ORDER — VANCOMYCIN HCL 10 G IV SOLR
1500.0000 mg | INTRAVENOUS | Status: DC
  Filled 2016-05-14: qty 1500

## 2016-05-14 MED ORDER — NITROGLYCERIN IN D5W 200-5 MCG/ML-% IV SOLN
2.0000 ug/min | INTRAVENOUS | Status: AC
  Administered 2016-05-14: 10 ug/min via INTRAVENOUS
  Filled 2016-05-14: qty 250

## 2016-05-14 MED ORDER — DEXMEDETOMIDINE HCL IN NACL 200 MCG/50ML IV SOLN
INTRAVENOUS | Status: AC
  Filled 2016-05-14: qty 50

## 2016-05-14 MED ORDER — ASPIRIN 81 MG PO CHEW
162.0000 mg | CHEWABLE_TABLET | Freq: Once | ORAL | Status: AC
  Administered 2016-05-14: 162 mg via ORAL
  Filled 2016-05-14: qty 2

## 2016-05-14 MED ORDER — HEMOSTATIC AGENTS (NO CHARGE) OPTIME
TOPICAL | Status: DC | PRN
  Administered 2016-05-14: 2 via TOPICAL

## 2016-05-14 MED ORDER — LIDOCAINE 2% (20 MG/ML) 5 ML SYRINGE
INTRAMUSCULAR | Status: AC
  Filled 2016-05-14: qty 10

## 2016-05-14 MED ORDER — FENTANYL CITRATE (PF) 250 MCG/5ML IJ SOLN
INTRAMUSCULAR | Status: DC | PRN
  Administered 2016-05-14: 250 ug via INTRAVENOUS
  Administered 2016-05-14: 100 ug via INTRAVENOUS
  Administered 2016-05-14: 250 ug via INTRAVENOUS
  Administered 2016-05-14: 100 ug via INTRAVENOUS
  Administered 2016-05-14: 150 ug via INTRAVENOUS
  Administered 2016-05-14 (×3): 100 ug via INTRAVENOUS
  Administered 2016-05-14: 250 ug via INTRAVENOUS
  Administered 2016-05-14 (×2): 50 ug via INTRAVENOUS

## 2016-05-14 MED ORDER — DOPAMINE-DEXTROSE 3.2-5 MG/ML-% IV SOLN
INTRAVENOUS | Status: AC
  Filled 2016-05-14: qty 250

## 2016-05-14 MED ORDER — HEMOSTATIC AGENTS (NO CHARGE) OPTIME
TOPICAL | Status: DC | PRN
  Administered 2016-05-14: 1 via TOPICAL

## 2016-05-14 MED ORDER — GLYCOPYRROLATE 0.2 MG/ML IV SOSY
PREFILLED_SYRINGE | INTRAVENOUS | Status: AC
  Filled 2016-05-14: qty 6

## 2016-05-14 MED ORDER — SUCCINYLCHOLINE CHLORIDE 200 MG/10ML IV SOSY
PREFILLED_SYRINGE | INTRAVENOUS | Status: AC
  Filled 2016-05-14: qty 10

## 2016-05-14 MED ORDER — MIDAZOLAM HCL 10 MG/2ML IJ SOLN
INTRAMUSCULAR | Status: AC
  Filled 2016-05-14: qty 2

## 2016-05-14 MED ORDER — FENTANYL CITRATE (PF) 250 MCG/5ML IJ SOLN
INTRAMUSCULAR | Status: AC
  Filled 2016-05-14: qty 5

## 2016-05-14 MED ORDER — FENTANYL CITRATE (PF) 100 MCG/2ML IJ SOLN
INTRAMUSCULAR | Status: AC
  Filled 2016-05-14: qty 2

## 2016-05-14 MED ORDER — IOPAMIDOL (ISOVUE-370) INJECTION 76%
INTRAVENOUS | Status: AC
  Filled 2016-05-14: qty 50

## 2016-05-14 MED ORDER — ATROPINE SULFATE 0.4 MG/ML IV SOSY
PREFILLED_SYRINGE | INTRAVENOUS | Status: AC
  Filled 2016-05-14: qty 2.5

## 2016-05-14 MED ORDER — PHENYLEPHRINE 40 MCG/ML (10ML) SYRINGE FOR IV PUSH (FOR BLOOD PRESSURE SUPPORT)
PREFILLED_SYRINGE | INTRAVENOUS | Status: AC
  Filled 2016-05-14: qty 10

## 2016-05-14 MED ORDER — HEPARIN SODIUM (PORCINE) 1000 UNIT/ML IJ SOLN
INTRAMUSCULAR | Status: DC | PRN
  Administered 2016-05-14: 33000 [IU] via INTRAVENOUS
  Administered 2016-05-14: 3000 [IU] via INTRAVENOUS

## 2016-05-14 MED ORDER — MILRINONE LACTATE IN DEXTROSE 20-5 MG/100ML-% IV SOLN
0.1250 ug/kg/min | INTRAVENOUS | Status: DC
  Administered 2016-05-14: 0.25 ug/kg/min via INTRAVENOUS
  Administered 2016-05-15 – 2016-05-16 (×3): 0.375 ug/kg/min via INTRAVENOUS
  Administered 2016-05-16 (×2): 0.25 ug/kg/min via INTRAVENOUS
  Administered 2016-05-17: 0.125 ug/kg/min via INTRAVENOUS
  Administered 2016-05-17: 0.25 ug/kg/min via INTRAVENOUS
  Filled 2016-05-14 (×8): qty 100

## 2016-05-14 MED ORDER — LIDOCAINE HCL (PF) 1 % IJ SOLN
INTRAMUSCULAR | Status: AC
  Filled 2016-05-14: qty 30

## 2016-05-14 MED ORDER — NITROGLYCERIN 1 MG/10 ML FOR IR/CATH LAB
INTRA_ARTERIAL | Status: DC | PRN
  Administered 2016-05-14: 200 ug via INTRA_ARTERIAL

## 2016-05-14 MED ORDER — PROTAMINE SULFATE 10 MG/ML IV SOLN
INTRAVENOUS | Status: AC
  Filled 2016-05-14: qty 50

## 2016-05-14 MED ORDER — DOPAMINE-DEXTROSE 3.2-5 MG/ML-% IV SOLN
0.0000 ug/kg/min | INTRAVENOUS | Status: AC
  Administered 2016-05-14: 3 ug/kg/min via INTRAVENOUS
  Filled 2016-05-14 (×2): qty 250

## 2016-05-14 MED ORDER — PROPOFOL 10 MG/ML IV BOLUS
INTRAVENOUS | Status: DC | PRN
  Administered 2016-05-14: 40 mg via INTRAVENOUS
  Administered 2016-05-14: 50 mg via INTRAVENOUS
  Administered 2016-05-14: 100 mg via INTRAVENOUS

## 2016-05-14 MED ORDER — ATROPINE SULFATE 1 MG/10ML IJ SOSY
PREFILLED_SYRINGE | INTRAMUSCULAR | Status: DC | PRN
  Administered 2016-05-14: 1 mg via INTRAVENOUS

## 2016-05-14 MED ORDER — IOPAMIDOL (ISOVUE-370) INJECTION 76%
INTRAVENOUS | Status: AC
  Filled 2016-05-14: qty 125

## 2016-05-14 MED ORDER — PHENYLEPHRINE HCL 10 MG/ML IJ SOLN
INTRAVENOUS | Status: DC | PRN
  Administered 2016-05-14: 10 ug/min via INTRAVENOUS

## 2016-05-14 MED ORDER — HEPARIN SODIUM (PORCINE) 1000 UNIT/ML IJ SOLN
INTRAMUSCULAR | Status: AC
  Filled 2016-05-14: qty 1

## 2016-05-14 MED ORDER — MIDAZOLAM HCL 2 MG/2ML IJ SOLN
INTRAMUSCULAR | Status: DC | PRN
  Administered 2016-05-14: 1 mg via INTRAVENOUS

## 2016-05-14 MED ORDER — ATROPINE SULFATE 1 MG/10ML IJ SOSY
PREFILLED_SYRINGE | INTRAMUSCULAR | Status: AC
  Filled 2016-05-14: qty 10

## 2016-05-14 MED ORDER — FENTANYL CITRATE (PF) 250 MCG/5ML IJ SOLN
INTRAMUSCULAR | Status: AC
  Filled 2016-05-14: qty 25

## 2016-05-14 MED ORDER — HEPARIN SODIUM (PORCINE) 1000 UNIT/ML IJ SOLN
INTRAMUSCULAR | Status: AC
  Filled 2016-05-14: qty 2

## 2016-05-14 MED ORDER — SODIUM CHLORIDE 0.9 % IV SOLN: Freq: Once | INTRAVENOUS | Status: AC

## 2016-05-14 MED ORDER — ETOMIDATE 2 MG/ML IV SOLN
INTRAVENOUS | Status: AC
  Filled 2016-05-14: qty 10

## 2016-05-14 MED ORDER — FENTANYL CITRATE (PF) 100 MCG/2ML IJ SOLN
INTRAMUSCULAR | Status: DC | PRN
  Administered 2016-05-14: 50 ug via INTRAVENOUS

## 2016-05-14 MED ORDER — 0.9 % SODIUM CHLORIDE (POUR BTL) OPTIME
TOPICAL | Status: DC | PRN
  Administered 2016-05-14: 2000 mL

## 2016-05-14 MED ORDER — MIDAZOLAM HCL 5 MG/5ML IJ SOLN
INTRAMUSCULAR | Status: DC | PRN
  Administered 2016-05-14: 1 mg via INTRAVENOUS
  Administered 2016-05-14: 4 mg via INTRAVENOUS

## 2016-05-14 MED ORDER — HEPARIN (PORCINE) IN NACL 2-0.9 UNIT/ML-% IJ SOLN
INTRAMUSCULAR | Status: DC | PRN
  Administered 2016-05-14 (×2): 10 mL via INTRA_ARTERIAL

## 2016-05-14 MED ORDER — HYDROCORTISONE NA SUCCINATE PF 100 MG IJ SOLR
INTRAMUSCULAR | Status: DC | PRN
  Administered 2016-05-14: 250 mg via INTRAVENOUS

## 2016-05-14 SURGICAL SUPPLY — 138 items
ADAPTER CARDIO PERF ANTE/RETRO (ADAPTER) ×3 IMPLANT
ADPR PRFSN 84XANTGRD RTRGD (ADAPTER) ×1
BAG DECANTER FOR FLEXI CONT (MISCELLANEOUS) ×3 IMPLANT
BANDAGE ELASTIC 4 VELCRO ST LF (GAUZE/BANDAGES/DRESSINGS) ×3 IMPLANT
BANDAGE ELASTIC 6 VELCRO ST LF (GAUZE/BANDAGES/DRESSINGS) ×3 IMPLANT
BASKET HEART  (ORDER IN 25'S) (MISCELLANEOUS) ×1
BASKET HEART (ORDER IN 25'S) (MISCELLANEOUS) ×1
BASKET HEART (ORDER IN 25S) (MISCELLANEOUS) ×1 IMPLANT
BLADE CLIPPER SURG (BLADE) ×4 IMPLANT
BLADE MINI RND TIP GREEN BEAV (BLADE) ×2 IMPLANT
BLADE STERNUM SYSTEM 6 (BLADE) ×3 IMPLANT
BLADE SURG 12 STRL SS (BLADE) ×3 IMPLANT
BLADE SURG 15 STRL LF DISP TIS (BLADE) IMPLANT
BLADE SURG 15 STRL SS (BLADE) ×3
BLADE SURG ROTATE 9660 (MISCELLANEOUS) IMPLANT
BNDG GAUZE ELAST 4 BULKY (GAUZE/BANDAGES/DRESSINGS) ×3 IMPLANT
CANISTER SUCTION 2500CC (MISCELLANEOUS) ×3 IMPLANT
CANNULA EZ GLIDE 8.0 24FR (CANNULA) ×2 IMPLANT
CANNULA EZ GLIDE AORTIC 21FR (CANNULA) ×2 IMPLANT
CANNULA GRAFT 8MMX50CM (Graft) ×2 IMPLANT
CANNULA GUNDRY RCSP 15FR (MISCELLANEOUS) ×3 IMPLANT
CANNULA SUMP PERICARDIAL (CANNULA) ×2 IMPLANT
CATH CPB KIT VANTRIGT (MISCELLANEOUS) ×3 IMPLANT
CATH FOLEY 2WAY SLVR 30CC 24FR (CATHETERS) ×2 IMPLANT
CATH HEART VENT LEFT (CATHETERS) IMPLANT
CATH ROBINSON RED A/P 18FR (CATHETERS) ×11 IMPLANT
CATH THORACIC 36FR RT ANG (CATHETERS) ×7 IMPLANT
CAUTERY SURG HI TEMP FINE TIP (MISCELLANEOUS) ×2 IMPLANT
CLIP TI WIDE RED SMALL 24 (CLIP) ×2 IMPLANT
CONT SPEC 4OZ CLIKSEAL STRL BL (MISCELLANEOUS) ×2 IMPLANT
COVER MAYO STAND STRL (DRAPES) ×2 IMPLANT
CRADLE DONUT ADULT HEAD (MISCELLANEOUS) ×3 IMPLANT
DRAIN CHANNEL 32F RND 10.7 FF (WOUND CARE) ×3 IMPLANT
DRAPE CARDIOVASCULAR INCISE (DRAPES) ×3
DRAPE SLUSH/WARMER DISC (DRAPES) ×3 IMPLANT
DRAPE SRG 135X102X78XABS (DRAPES) ×1 IMPLANT
DRSG AQUACEL AG ADV 3.5X14 (GAUZE/BANDAGES/DRESSINGS) ×3 IMPLANT
DRSG COVADERM 4X14 (GAUZE/BANDAGES/DRESSINGS) ×2 IMPLANT
ELECT BLADE 4.0 EZ CLEAN MEGAD (MISCELLANEOUS) ×3
ELECT BLADE 6.5 EXT (BLADE) ×3 IMPLANT
ELECT CAUTERY BLADE 6.4 (BLADE) ×3 IMPLANT
ELECT REM PT RETURN 9FT ADLT (ELECTROSURGICAL) ×6
ELECTRODE BLDE 4.0 EZ CLN MEGD (MISCELLANEOUS) ×1 IMPLANT
ELECTRODE REM PT RTRN 9FT ADLT (ELECTROSURGICAL) ×2 IMPLANT
FELT TEFLON 1X6 (MISCELLANEOUS) ×7 IMPLANT
GAUZE SPONGE 4X4 12PLY STRL (GAUZE/BANDAGES/DRESSINGS) ×6 IMPLANT
GELSOFT PLUS KNITTED VASCULAR GRAFT 10MM ×2 IMPLANT
GLOVE BIO SURGEON STRL SZ7.5 (GLOVE) ×9 IMPLANT
GLOVE BIOGEL M 6.5 STRL (GLOVE) ×6 IMPLANT
GLOVE BIOGEL M STRL SZ7.5 (GLOVE) ×6 IMPLANT
GLOVE BIOGEL PI IND STRL 6.5 (GLOVE) IMPLANT
GLOVE BIOGEL PI INDICATOR 6.5 (GLOVE) ×8
GOWN STRL REUS W/ TWL LRG LVL3 (GOWN DISPOSABLE) ×4 IMPLANT
GOWN STRL REUS W/TWL LRG LVL3 (GOWN DISPOSABLE) ×12
GRAFT VASC 10X60CM STRAIGHT (Prosthesis & Implant Heart) ×2 IMPLANT
GRAFT WOVEN D/V 26DX30L (Vascular Products) ×2 IMPLANT
HANDLE STAPLE ENDO GIA SHORT (STAPLE) ×2
HEMOSTAT POWDER SURGIFOAM 1G (HEMOSTASIS) ×9 IMPLANT
HEMOSTAT SURGICEL 2X14 (HEMOSTASIS) ×5 IMPLANT
HEMOSTAT SURGICEL 2X4 FIBR (HEMOSTASIS) ×2 IMPLANT
INSERT FOGARTY XLG (MISCELLANEOUS) ×2 IMPLANT
KIT BASIN OR (CUSTOM PROCEDURE TRAY) ×3 IMPLANT
KIT ROOM TURNOVER OR (KITS) ×3 IMPLANT
KIT SUCTION CATH 14FR (SUCTIONS) ×3 IMPLANT
KIT VASOVIEW 6 PRO VH 2400 (KITS) ×3 IMPLANT
LEAD PACING MYOCARDI (MISCELLANEOUS) ×3 IMPLANT
LINE VENT (MISCELLANEOUS) ×2 IMPLANT
LOOP VESSEL MAXI BLUE (MISCELLANEOUS) ×2 IMPLANT
MARKER GRAFT CORONARY BYPASS (MISCELLANEOUS) ×9 IMPLANT
NS IRRIG 1000ML POUR BTL (IV SOLUTION) ×15 IMPLANT
PACK OPEN HEART (CUSTOM PROCEDURE TRAY) ×3 IMPLANT
PAD ARMBOARD 7.5X6 YLW CONV (MISCELLANEOUS) ×6 IMPLANT
PAD ELECT DEFIB RADIOL ZOLL (MISCELLANEOUS) ×3 IMPLANT
PENCIL BUTTON HOLSTER BLD 10FT (ELECTRODE) ×3 IMPLANT
PUNCH AORTIC ROTATE 4.0MM (MISCELLANEOUS) IMPLANT
PUNCH AORTIC ROTATE 4.5MM 8IN (MISCELLANEOUS) IMPLANT
PUNCH AORTIC ROTATE 5MM 8IN (MISCELLANEOUS) IMPLANT
RELOAD ENDO GIA 30 3.5 (STAPLE) ×2 IMPLANT
SEALANT SURG COSEAL 4ML (VASCULAR PRODUCTS) ×2 IMPLANT
SEALANT SURG COSEAL 8ML (VASCULAR PRODUCTS) ×4 IMPLANT
SET CARDIOPLEGIA MPS 5001102 (MISCELLANEOUS) ×2 IMPLANT
SPONGE GAUZE 4X4 12PLY STER LF (GAUZE/BANDAGES/DRESSINGS) ×6 IMPLANT
SPONGE LAP 18X18 X RAY DECT (DISPOSABLE) ×8 IMPLANT
SPONGE LAP 4X18 X RAY DECT (DISPOSABLE) ×2 IMPLANT
STAPLER ENDO GIA 12 SHRT THIN (STAPLE) IMPLANT
STAPLER ENDO GIA 12MM SHORT (STAPLE) ×1 IMPLANT
STAPLER VISISTAT 35W (STAPLE) ×2 IMPLANT
STOPCOCK 4 WAY LG BORE MALE ST (IV SETS) ×2 IMPLANT
SURGIFLO W/THROMBIN 8M KIT (HEMOSTASIS) ×3 IMPLANT
SUT BONE WAX W31G (SUTURE) ×3 IMPLANT
SUT ETHIBOND 2 0 SH (SUTURE) ×3
SUT ETHIBOND 2 0 SH 36X2 (SUTURE) IMPLANT
SUT MNCRL AB 3-0 PS2 18 (SUTURE) ×2 IMPLANT
SUT MNCRL AB 4-0 PS2 18 (SUTURE) IMPLANT
SUT PROLENE 3 0 SH DA (SUTURE) IMPLANT
SUT PROLENE 3 0 SH1 36 (SUTURE) IMPLANT
SUT PROLENE 4 0 RB 1 (SUTURE) ×96
SUT PROLENE 4 0 SH DA (SUTURE) ×13 IMPLANT
SUT PROLENE 4-0 RB1 .5 CRCL 36 (SUTURE) ×1 IMPLANT
SUT PROLENE 5 0 C 1 36 (SUTURE) ×8 IMPLANT
SUT PROLENE 6 0 C 1 24 (SUTURE) ×10 IMPLANT
SUT PROLENE 6 0 C 1 30 (SUTURE) ×6 IMPLANT
SUT PROLENE 6 0 CC (SUTURE) ×13 IMPLANT
SUT PROLENE 8 0 BV175 6 (SUTURE) ×6 IMPLANT
SUT PROLENE BLUE 7 0 (SUTURE) ×7 IMPLANT
SUT SILK  1 MH (SUTURE) ×2
SUT SILK 1 MH (SUTURE) IMPLANT
SUT SILK 2 0 SH CR/8 (SUTURE) ×4 IMPLANT
SUT SILK 2 0 TIES 10X30 (SUTURE) ×2 IMPLANT
SUT SILK 3 0 SH CR/8 (SUTURE) IMPLANT
SUT SILK 4 0 REEL (SUTURE) ×2 IMPLANT
SUT STEEL 6MS V (SUTURE) ×8 IMPLANT
SUT STEEL SZ 6 DBL 3X14 BALL (SUTURE) ×3 IMPLANT
SUT VIC AB 1 CTX 18 (SUTURE) ×2 IMPLANT
SUT VIC AB 1 CTX 36 (SUTURE) ×9
SUT VIC AB 1 CTX36XBRD ANBCTR (SUTURE) ×2 IMPLANT
SUT VIC AB 2-0 CT1 27 (SUTURE) ×3
SUT VIC AB 2-0 CT1 TAPERPNT 27 (SUTURE) IMPLANT
SUT VIC AB 2-0 CTX 27 (SUTURE) IMPLANT
SUT VIC AB 3-0 X1 27 (SUTURE) ×2 IMPLANT
SUTURE E-PAK OPEN HEART (SUTURE) ×3 IMPLANT
SYR 50ML SLIP (SYRINGE) ×2 IMPLANT
SYSTEM SAHARA CHEST DRAIN ATS (WOUND CARE) ×3 IMPLANT
SYSTEM SAHARA CHEST DRAIN RE-I (WOUND CARE) ×2 IMPLANT
TAPE CLOTH SURG 4X10 WHT LF (GAUZE/BANDAGES/DRESSINGS) ×4 IMPLANT
TAPE PAPER 2X10 WHT MICROPORE (GAUZE/BANDAGES/DRESSINGS) ×2 IMPLANT
TOWEL OR 17X24 6PK STRL BLUE (TOWEL DISPOSABLE) ×6 IMPLANT
TOWEL OR 17X26 10 PK STRL BLUE (TOWEL DISPOSABLE) ×6 IMPLANT
TRAY CATH LUMEN 1 20CM STRL (SET/KITS/TRAYS/PACK) ×2 IMPLANT
TRAY FOLEY IC TEMP SENS 16FR (CATHETERS) ×3 IMPLANT
TUBE CONNECTING 12'X1/4 (SUCTIONS) ×1
TUBE CONNECTING 12X1/4 (SUCTIONS) ×1 IMPLANT
TUBING ART PRESS 48 MALE/FEM (TUBING) ×4 IMPLANT
TUBING INSUFFLATION (TUBING) ×3 IMPLANT
UNDERPAD 30X30 INCONTINENT (UNDERPADS AND DIAPERS) ×3 IMPLANT
VENT LEFT HEART 12002 (CATHETERS) ×3
WATER STERILE IRR 1000ML POUR (IV SOLUTION) ×6 IMPLANT
YANKAUER SUCT BULB TIP NO VENT (SUCTIONS) ×2 IMPLANT

## 2016-05-14 SURGICAL SUPPLY — 13 items
CATH INFINITI 4FR JL 4.0 (CATHETERS) ×2 IMPLANT
CATH INFINITI 5 FR JL3.5 (CATHETERS) ×4 IMPLANT
DEVICE RAD COMP TR BAND LRG (VASCULAR PRODUCTS) ×4 IMPLANT
GLIDESHEATH SLEND A-KIT 6F 22G (SHEATH) ×4 IMPLANT
GUIDE CATH RUNWAY 6FR FR4 (CATHETERS) ×2 IMPLANT
KIT ENCORE 26 ADVANTAGE (KITS) ×2 IMPLANT
KIT HEART LEFT (KITS) ×3 IMPLANT
PACK CARDIAC CATHETERIZATION (CUSTOM PROCEDURE TRAY) ×3 IMPLANT
TRANSDUCER W/STOPCOCK (MISCELLANEOUS) ×3 IMPLANT
TUBING CIL FLEX 10 FLL-RA (TUBING) ×3 IMPLANT
WIRE ASAHI PROWATER 180CM (WIRE) ×2 IMPLANT
WIRE HI TORQ VERSACORE-J 145CM (WIRE) ×2 IMPLANT
WIRE SAFE-T 1.5MM-J .035X260CM (WIRE) ×2 IMPLANT

## 2016-05-14 NOTE — ED Notes (Signed)
MD Tamala Julian at the bedside

## 2016-05-14 NOTE — ED Provider Notes (Signed)
[facility] DEPT Provider Note   CSN: [id number] Arrival date & time: 05/14/16  1252  First Provider Contact:  First MD Initiated Contact with Patient 05/14/16 1307        History   Chief Complaint Chief Complaint  Patient presents with  . Code STEMI    HPI Patrick Fritz is a 65 y.o. male.  Central chest pain and pressure that has been coming and going x 2 weeks.  Worse today, onset about 3 AM, radiating to both arms.  Associated with SOB and nausea.  Worse with exertion.  Hx PE, has been out of xarelto x 1 week.  No back or abdominal pain.  No known CAD.    The history is provided by the patient. The history is limited by the condition of the patient.    Past Medical History:  Diagnosis Date  . DJD (degenerative joint disease), lumbar   . DVT (deep venous thrombosis) ([facility])   . Glaucoma    R eye diminished vision approx 50%  . High cholesterol   . Hyperlipidemia   . Hypertension   . IFG (impaired fasting glucose)   . OSA (obstructive sleep apnea)    Severe w AHI 34/hr on HST no on BiPAP at 19/15cm H2O  . PE (pulmonary embolism)    Second occurance,enknown etiology,lifelong anticoagulation    Patient Active Problem List   Diagnosis Date Noted  . Obstructive sleep apnea (adult) (pediatric) 12/16/2013  . Morbid obesity ([facility]) 12/16/2013  . SOB (shortness of breath) 12/16/2013    History reviewed. No pertinent surgical history.     Home Medications    Prior to Admission medications   Medication Sig Start Date End Date Taking? Authorizing Provider  amoxicillin (AMOXIL) 500 MG capsule  12/04/13   Historical Provider, MD  cetirizine (ZYRTEC) 10 MG tablet Take 10 mg by mouth daily.    Historical Provider, MD  cholecalciferol (VITAMIN D) 1000 UNITS tablet Take 1,000 Units by mouth daily.    Historical Provider, MD  NON FORMULARY BiPAP    Historical Provider, MD  Omega-3 Fatty Acids (FISH OIL) 1000 MG CAPS Take 1 capsule by mouth 2 (two) times daily.    Historical  Provider, MD  Rivaroxaban (XARELTO) 20 MG TABS tablet Take 20 mg by mouth daily with supper.    Historical Provider, MD  sildenafil (VIAGRA) 100 MG tablet Take 100 mg by mouth daily as needed for erectile dysfunction.    Historical Provider, MD  simvastatin (ZOCOR) 20 MG tablet Take 20 mg by mouth daily.    Historical Provider, MD  VOLTAREN 1 % GEL  10/30/13   Historical Provider, MD    Family History Family History  Problem Relation Age of Onset  . Osteoporosis Mother   . Hypertension Father   . CAD Father     Social History Social History  Substance Use Topics  . Smoking status: Never Smoker  . Smokeless tobacco: Never Used  . Alcohol use Yes     Comment: 1-2 per week     Allergies   Penicillins   Review of Systems Review of Systems  Unable to perform ROS: Acuity of condition     Physical Exam Updated Vital Signs BP (!) 171/105 (BP Location: Right Arm)   Pulse 80   Temp 98.2 F (36.8 C) (Oral)   Resp 20   Ht 5\' 10"  (1.778 m)   Wt 245 lb (111.1 kg)   SpO2 99%   BMI 35.15 kg/m   Physical  Exam  Constitutional: He is oriented to person, place, and time. He appears well-developed and well-nourished. He appears distressed.  HENT:  Head: Normocephalic and atraumatic.  Mouth/Throat: Oropharynx is clear and moist. No oropharyngeal exudate.  Eyes: Conjunctivae and EOM are normal. Pupils are equal, round, and reactive to light.  Neck: Normal range of motion. Neck supple.  No meningismus.  Cardiovascular: Normal rate, regular rhythm, normal heart sounds and intact distal pulses.   No murmur heard. Pulmonary/Chest: Effort normal and breath sounds normal. No respiratory distress. He exhibits no tenderness.  Abdominal: Soft. There is no tenderness. There is no rebound and no guarding.  Musculoskeletal: Normal range of motion. He exhibits no edema or tenderness.  Neurological: He is alert and oriented to person, place, and time. No cranial nerve deficit. He exhibits  normal muscle tone. Coordination normal.   5/5 strength throughout. CN 2-12 intact.Equal grip strength.   Skin: Skin is warm.  Psychiatric: He has a normal mood and affect. His behavior is normal.  Nursing note and vitals reviewed.    ED Treatments / Results  Labs (all labs ordered are listed, but only abnormal results are displayed) Labs Reviewed  COMPREHENSIVE METABOLIC PANEL - Abnormal; Notable for the following:       Result Value   Glucose, Bld 152 (*)    All other components within normal limits  TROPONIN I - Abnormal; Notable for the following:    Troponin I 0.20 (*)    All other components within normal limits  LIPID PANEL - Abnormal; Notable for the following:    Triglycerides 165 (*)    LDL Cholesterol 101 (*)    All other components within normal limits  I-STAT TROPOININ, ED - Abnormal; Notable for the following:    Troponin i, poc 0.13 (*)    All other components within normal limits  I-STAT CHEM 8, ED - Abnormal; Notable for the following:    Glucose, Bld 138 (*)    All other components within normal limits  CBG MONITORING, ED - Abnormal; Notable for the following:    Glucose-Capillary 152 (*)    All other components within normal limits  CBC  DIFFERENTIAL  PROTIME-INR  APTT  I-STAT TROPOININ, ED  TYPE AND SCREEN  PREPARE RBC (CROSSMATCH)    EKG  EKG Interpretation  Date/Time:  Sunday May 14 2016 13:03:43 EDT Ventricular Rate:  71 PR Interval:  178 QRS Duration: 84 QT Interval:  370 QTC Calculation: 402 R Axis:   72 Text Interpretation:  ** Critical Test Result: STEMI Normal sinus rhythm with sinus arrhythmia Possible Anterior infarct , age undetermined Inferior injury pattern ** ** ACUTE MI / STEMI ** * Consider right ventricular involvement in acute inferior infarct Abnormal ECG inferior STEMI Confirmed by Senovia Gauer  MD, Klye Besecker [id number]) on 05/14/2016 1:16:37 PM       Radiology No results found.  Procedures Procedures (including critical care  time)  Medications Ordered in ED Medications  0.9 %  sodium chloride infusion (not administered)  aspirin chewable tablet 162 mg (not administered)  heparin injection 4,000 Units (not administered)     Initial Impression / Assessment and Plan / ED Course  I have reviewed the triage vital signs and the nursing notes.  Pertinent labs & imaging results that were available during my care of the patient were reviewed by me and considered in my medical decision making (see chart for details).  Clinical Course   Chest pain for the past 2 weeks, worse today. EKG  with inferior STEMI Code STEMI activated.  ASA and heparin given.  Dr. Tamala Julian at bedside. Vitals stable in the ED, airway stable. Patient taken emergently to cath lab.   CRITICAL CARE Performed by: Ezequiel Essex Total critical care time: 31 minutes Critical care time was exclusive of separately billable procedures and treating other patients. Critical care was necessary to treat or prevent imminent or life-threatening deterioration. Critical care was time spent personally by me on the following activities: development of treatment plan with patient and/or surrogate as well as nursing, discussions with consultants, evaluation of patient's response to treatment, examination of patient, obtaining history from patient or surrogate, ordering and performing treatments and interventions, ordering and review of laboratory studies, ordering and review of radiographic studies, pulse oximetry and re-evaluation of patient's condition.  Final Clinical Impressions(s) / ED Diagnoses   Final diagnoses:  ST elevation myocardial infarction (STEMI), unspecified artery [facility])    New Prescriptions New Prescriptions   No medications on file     Ezequiel Essex, MD 05/14/16 1542

## 2016-05-14 NOTE — ED Notes (Signed)
Patient undressed. Placed on the Zoll pads and Zoll. Pt belongings given to wife. Pt waiting for STEMI Team.

## 2016-05-14 NOTE — ED Notes (Signed)
Activated Code Stemi  

## 2016-05-14 NOTE — H&P (Signed)
Patrick Fritz is a 65 y.o. male  Admit Date: 05/14/2016 Referring Physician: Vevelyn Francois, MD Primary Cardiologist: Vic Blackbird, III, M.D. (new) Chief complaint / reason for admission: Acute inferior ST elevation myocardial infarction  HPI: 65 year old gentleman who developed chest discomfort 2 hours prior to being transported to the emergency room (~11 AM). EMS identified ST elevation in inferior leads and STEMI activation was started. He was seen in the emergency room and was having ongoing chest discomfort. He stated that the discomfort had somewhat decreased compared to 2 hours prior. There is no dyspnea. He denied radiation of the discomfort to the arms neck and back. No nausea or vomiting had occurred. No palpitations or syncope.  Emergency angiography with reperfusion therapy was the treatment recommended to the patient and accepted by the patient.  No prior history of heart disease. Does have hyperlipidemia, hypertension, history of recurrent DVT, prior PE, chronic anticoagulation therapy with Xarelto but has not been on the medication for 7 days prior to presentation because of a mixup with the mail order pharmacy. He denies any recent bleeding tendencies or concerns.  PMH:    Past Medical History:  Diagnosis Date  . DJD (degenerative joint disease), lumbar   . DVT (deep venous thrombosis) ([place])   . Glaucoma    R eye diminished vision approx 50%  . High cholesterol   . Hyperlipidemia   . Hypertension   . IFG (impaired fasting glucose)   . OSA (obstructive sleep apnea)    Severe w AHI 34/hr on HST no on BiPAP at 19/15cm H2O  . PE (pulmonary embolism)    Second occurance,enknown etiology,lifelong anticoagulation    PSH:   History reviewed. No pertinent surgical history. ALLERGIES:   Penicillins Prior to Admit Meds:   Prescriptions Prior to Admission  Medication Sig Dispense Refill Last Dose  . amoxicillin (AMOXIL) 500 MG capsule    Taking  . cetirizine (ZYRTEC) 10 MG tablet  Take 10 mg by mouth daily.   Taking  . cholecalciferol (VITAMIN D) 1000 UNITS tablet Take 1,000 Units by mouth daily.   Taking  . NON FORMULARY BiPAP   Taking  . Omega-3 Fatty Acids (FISH OIL) 1000 MG CAPS Take 1 capsule by mouth 2 (two) times daily.   Taking  . Rivaroxaban (XARELTO) 20 MG TABS tablet Take 20 mg by mouth daily with supper.   Taking  . sildenafil (VIAGRA) 100 MG tablet Take 100 mg by mouth daily as needed for erectile dysfunction.   Taking  . simvastatin (ZOCOR) 20 MG tablet Take 20 mg by mouth daily.   Taking  . VOLTAREN 1 % GEL    Taking   Family HX:    Family History  Problem Relation Age of Onset  . Osteoporosis Mother   . Hypertension Father   . CAD Father    Social HX:    Social History   Social History  . Marital status: Married    Spouse name: N/A  . Number of children: N/A  . Years of education: N/A   Occupational History  . Not on file.   Social History Main Topics  . Smoking status: Never Smoker  . Smokeless tobacco: Never Used  . Alcohol use Yes     Comment: 1-2 per week  . Drug use: Unknown  . Sexual activity: Not on file   Other Topics Concern  . Not on file   Social History Narrative  . No narrative on file  ROS Easy bruising. 6-8 month history of dyspnea on exertion. Mild chest discomfort with heavy exertion over the past 6 months. He denies orthopnea, neurological complaints, and recent trauma. All other systems are negative.  Physical Exam: Blood pressure (!) 181/104, pulse (!) 0, temperature 98.2 F (36.8 C), temperature source Oral, resp. rate (!) 22, height 5\' 10"  (1.778 m), weight 245 lb (111.1 kg), SpO2 100 %.    Morbidly obese gentleman lying relatively flat, hospital gurney. Skin is warm and dry. Color is good. HEENT exam reveals no jaundice. Pupils equal and reactive. No significant JVD is noted with the patient lying at 30. Chest is clear anterior and posterior by auscultation.  Cardiac exam reveals soft heart tones.  Difficult to hear due to apparatus. No obvious murmur or rub is heard. Abdomen is soft. Bowel sounds are normal. No masses are noted. Extremities reveal no edema. Pulses are 2+ and symmetric in upper and lower extremities. Neurological exam reveals no motor or sensory deficits. Patient is alert, oriented, and intact.    Labs: Lab Results  Component Value Date   WBC 8.2 05/14/2016   HGB 14.3 05/14/2016   HCT 42.0 05/14/2016   MCV 88.0 05/14/2016   PLT 196 05/14/2016    Recent Labs Lab 05/14/16 1310 05/14/16 1323  NA 139 142  K 3.8 3.9  CL 107 103  CO2 25  --   BUN 12 14  CREATININE 1.02 1.00  CALCIUM 9.1  --   PROT 6.5  --   BILITOT 0.5  --   ALKPHOS 61  --   ALT 18  --   AST 25  --   GLUCOSE 152* 138*   Lab Results  Component Value Date   TROPONINI 0.20 ([place]) 05/14/2016      Radiology:  No results found.  EKG:  Sinus bradycardia, ST elevation of 2 mm in 2-3 aVF and V6. Reciprocal ST depression noted in 1, aVL, and V2.  ASSESSMENT:  1. Acute inferior wall ST elevation myocardial infarction without evidence of hemodynamic compromise or heart failure. No significant arrhythmias. Ongoing chest pain is present. 2. History of DVT and pulmonary emboli. Abrupt cessation of Xarelto therapy greater than one week ago after a pharmacy mixup with his mail order provider. 3. Essential hypertension 4. Hyperlipidemia  Plan:  1. We recommended emergency cardiac catheterization and coronary angiography with mechanical reperfusion of the infarct related artery if appropriate.. This was discussed with patient and his wife. Both are in agreement with proceeding as quickly as possible. Risk of the procedure including death, stroke, myocardial infarction, emergency surgery, kidney failure, limb ischemia, infection, and bleeding among other risks were discussed.  [name] 05/14/2016 4:48 PM

## 2016-05-14 NOTE — Progress Notes (Signed)
Day of Surgery Procedure(s) (LRB): CORONARY ARTERY BYPASS GRAFTING (CABG) (N/A) Subjective:  65 yo with acute DMI -RCA occlusion after unstable angina > 1 week Cath with RCA dissection, dissection of aortic root LAD with 80% stenosis Emergency CABG recommended by patients cardiologist Objective:  PMHx - DVT and PE, obesity Vital signs in last 24 hours: Temp:  [98.2 F (36.8 C)] 98.2 F (36.8 C) (07/30 1259) Pulse Rate:  [0-142] 0 (07/30 1544) Cardiac Rhythm: Normal sinus rhythm (07/30 1310) Resp:  [0-27] 22 (07/30 1544) BP: (103-191)/(48-109) 181/104 (07/30 1524) SpO2:  [0 %-100 %] 100 % (07/30 1544) Weight:  [245 lb (111.1 kg)] 245 lb (111.1 kg) (07/30 1259)  Hemodynamic parameters for last 24 hours:  nsr  Intake/Output from previous day: No intake/output data recorded. Intake/Output this shift: No intake/output data recorded.  Covered with drapes in cath lab No carotid bruits Bilateral radial artery sheaths  Lab Results:  Recent Labs  05/14/16 1310 05/14/16 1323  WBC 8.2  --   HGB 13.8 14.3  HCT 40.9 42.0  PLT 196  --    BMET:  Recent Labs  05/14/16 1310 05/14/16 1323  NA 139 142  K 3.8 3.9  CL 107 103  CO2 25  --   GLUCOSE 152* 138*  BUN 12 14  CREATININE 1.02 1.00  CALCIUM 9.1  --     PT/INR:  Recent Labs  05/14/16 1310  LABPROT 13.2  INR 1.00   ABG    Component Value Date/Time   TCO2 25 05/14/2016 1323   CBG (last 3)   Recent Labs  05/14/16 1311  GLUCAP 152*    Assessment/Plan: S/P Procedure(s) (LRB): CORONARY ARTERY BYPASS GRAFTING (CABG) (N/A) Acute MI with dissection of RCA, aortic root during attempted PCI Emerg CABG, aortic repair Procedure d/w patient, also wife in CCU wait room  LOS: 0 days    Patrick Fritz 05/14/2016

## 2016-05-14 NOTE — ED Notes (Signed)
Pt being taken upstairs at this time  

## 2016-05-14 NOTE — Progress Notes (Signed)
  Echocardiogram 2D Echocardiogram Limited has been performed.  Bobbye Charleston 05/14/2016, 3:14 PM

## 2016-05-14 NOTE — Anesthesia Procedure Notes (Signed)
Procedure Name: Intubation Date/Time: 05/14/2016 4:09 PM Performed by: Maude Leriche D Pre-anesthesia Checklist: Patient identified, Emergency Drugs available, Suction available, Patient being monitored and Timeout performed Patient Re-evaluated:Patient Re-evaluated prior to inductionOxygen Delivery Method: Circle system utilized Preoxygenation: Pre-oxygenation with 100% oxygen Intubation Type: IV induction Ventilation: Mask ventilation without difficulty Laryngoscope Size: Miller and 2 Grade View: Grade I Tube type: Subglottic suction tube Tube size: 8.0 mm Number of attempts: 1 Airway Equipment and Method: Stylet Placement Confirmation: ETT inserted through vocal cords under direct vision,  positive ETCO2 and breath sounds checked- equal and bilateral Secured at: 22 cm Tube secured with: Tape Dental Injury: Teeth and Oropharynx as per pre-operative assessment

## 2016-05-14 NOTE — Anesthesia Preprocedure Evaluation (Addendum)
Anesthesia Evaluation  Patient identified by MRN, date of birth, ID band Patient awake    Reviewed: Allergy & Precautions, H&P , NPO status , Patient's Chart, lab work & pertinent test results  Airway Mallampati: II  TM Distance: <3 FB Neck ROM: Full    Dental no notable dental hx. (+) Teeth Intact, Dental Advisory Given   Pulmonary shortness of breath, sleep apnea ,    Pulmonary exam normal breath sounds clear to auscultation       Cardiovascular hypertension, + angina + CAD and + Past MI   Rhythm:Regular Rate:Normal     Neuro/Psych negative neurological ROS  negative psych ROS   GI/Hepatic negative GI ROS, Neg liver ROS,   Endo/Other  Morbid obesity  Renal/GU negative Renal ROS  negative genitourinary   Musculoskeletal  (+) Arthritis , Osteoarthritis,    Abdominal   Peds  Hematology negative hematology ROS (+)   Anesthesia Other Findings   Reproductive/Obstetrics negative OB ROS                           Anesthesia Physical Anesthesia Plan  ASA: IV and emergent  Anesthesia Plan: General   Post-op Pain Management:    Induction: Intravenous  Airway Management Planned: Oral ETT  Additional Equipment: Arterial line, CVP, PA Cath, TEE and Ultrasound Guidance Line Placement  Intra-op Plan:   Post-operative Plan: Post-operative intubation/ventilation  Informed Consent: I have reviewed the patients History and Physical, chart, labs and discussed the procedure including the risks, benefits and alternatives for the proposed anesthesia with the patient or authorized representative who has indicated his/her understanding and acceptance.   Dental advisory given  Plan Discussed with: CRNA, Anesthesiologist and Surgeon  Anesthesia Plan Comments:        Anesthesia Quick Evaluation

## 2016-05-14 NOTE — Progress Notes (Signed)
Patrick Fritz responded to a code STEMI. Pt had already arrived and was in ED A13. Wife was at bedside. Pt was waiting for Cath lab to be ready. CH offered hospitality. CH is available for follow up as needed.  Patrick Fritz    05/14/16 1300  Clinical Encounter Type  Visited With Patient;Patient and family together  Visit Type Initial;Spiritual support;Code;ED (STEMI)  Referral From Nurse  Spiritual Encounters  Spiritual Needs Emotional

## 2016-05-14 NOTE — Anesthesia Procedure Notes (Signed)
Central Venous Catheter Insertion Performed by: anesthesiologist 05/14/2016 3:30 PM Patient location: OR. Emergency situation Preanesthetic checklist: patient identified, IV checked, site marked, risks and benefits discussed, surgical consent, monitors and equipment checked, pre-op evaluation, timeout performed and anesthesia consent Position: Trendelenburg Lidocaine 1% used for infiltration Landmarks identified and Seldinger technique used Catheter size: 9 Fr Central line and PA cath was placed.MAC introducer Swan type and PA catheter depth:thermodilution and 50PA Cath depth:50 Procedure performed using ultrasound guided technique. Attempts: 1 Following insertion, line sutured and dressing applied. Post procedure assessment: blood return through all ports, free fluid flow and no air. Patient tolerated the procedure well with no immediate complications.

## 2016-05-14 NOTE — ED Triage Notes (Signed)
Patient here with 2 weeks of chest discomfort, diaphoresis and shortness of breath, today the pain is worse and has not had his blood thinner x 1 week. Patient pale on arrival and appears uncomfortable

## 2016-05-15 ENCOUNTER — Encounter (HOSPITAL_COMMUNITY): Payer: Self-pay | Admitting: Interventional Cardiology

## 2016-05-15 ENCOUNTER — Other Ambulatory Visit: Payer: Self-pay

## 2016-05-15 ENCOUNTER — Inpatient Hospital Stay (HOSPITAL_COMMUNITY): Payer: Medicare Other

## 2016-05-15 DIAGNOSIS — D696 Thrombocytopenia, unspecified: Secondary | ICD-10-CM | POA: Diagnosis not present

## 2016-05-15 DIAGNOSIS — I517 Cardiomegaly: Secondary | ICD-10-CM | POA: Diagnosis not present

## 2016-05-15 DIAGNOSIS — I7100 Dissection of unspecified site of aorta: Secondary | ICD-10-CM | POA: Diagnosis present

## 2016-05-15 DIAGNOSIS — J90 Pleural effusion, not elsewhere classified: Secondary | ICD-10-CM | POA: Diagnosis not present

## 2016-05-15 DIAGNOSIS — Z6837 Body mass index (BMI) 37.0-37.9, adult: Secondary | ICD-10-CM | POA: Diagnosis not present

## 2016-05-15 DIAGNOSIS — E662 Morbid (severe) obesity with alveolar hypoventilation: Secondary | ICD-10-CM | POA: Diagnosis present

## 2016-05-15 DIAGNOSIS — H409 Unspecified glaucoma: Secondary | ICD-10-CM | POA: Diagnosis present

## 2016-05-15 DIAGNOSIS — E785 Hyperlipidemia, unspecified: Secondary | ICD-10-CM | POA: Diagnosis present

## 2016-05-15 DIAGNOSIS — I9751 Accidental puncture and laceration of a circulatory system organ or structure during a circulatory system procedure: Secondary | ICD-10-CM | POA: Diagnosis not present

## 2016-05-15 DIAGNOSIS — Z86718 Personal history of other venous thrombosis and embolism: Secondary | ICD-10-CM | POA: Diagnosis not present

## 2016-05-15 DIAGNOSIS — J939 Pneumothorax, unspecified: Secondary | ICD-10-CM | POA: Diagnosis not present

## 2016-05-15 DIAGNOSIS — I5043 Acute on chronic combined systolic (congestive) and diastolic (congestive) heart failure: Secondary | ICD-10-CM | POA: Diagnosis present

## 2016-05-15 DIAGNOSIS — I2542 Coronary artery dissection: Secondary | ICD-10-CM | POA: Diagnosis present

## 2016-05-15 DIAGNOSIS — Z7901 Long term (current) use of anticoagulants: Secondary | ICD-10-CM | POA: Diagnosis not present

## 2016-05-15 DIAGNOSIS — I110 Hypertensive heart disease with heart failure: Secondary | ICD-10-CM | POA: Diagnosis present

## 2016-05-15 DIAGNOSIS — I2111 ST elevation (STEMI) myocardial infarction involving right coronary artery: Secondary | ICD-10-CM | POA: Diagnosis not present

## 2016-05-15 DIAGNOSIS — Z951 Presence of aortocoronary bypass graft: Secondary | ICD-10-CM | POA: Diagnosis not present

## 2016-05-15 DIAGNOSIS — D62 Acute posthemorrhagic anemia: Secondary | ICD-10-CM | POA: Diagnosis not present

## 2016-05-15 DIAGNOSIS — I2119 ST elevation (STEMI) myocardial infarction involving other coronary artery of inferior wall: Secondary | ICD-10-CM | POA: Diagnosis present

## 2016-05-15 DIAGNOSIS — Z86711 Personal history of pulmonary embolism: Secondary | ICD-10-CM | POA: Diagnosis not present

## 2016-05-15 DIAGNOSIS — M069 Rheumatoid arthritis, unspecified: Secondary | ICD-10-CM | POA: Diagnosis present

## 2016-05-15 DIAGNOSIS — I213 ST elevation (STEMI) myocardial infarction of unspecified site: Secondary | ICD-10-CM | POA: Diagnosis present

## 2016-05-15 DIAGNOSIS — Y840 Cardiac catheterization as the cause of abnormal reaction of the patient, or of later complication, without mention of misadventure at the time of the procedure: Secondary | ICD-10-CM | POA: Diagnosis not present

## 2016-05-15 DIAGNOSIS — R0602 Shortness of breath: Secondary | ICD-10-CM | POA: Diagnosis not present

## 2016-05-15 DIAGNOSIS — I2510 Atherosclerotic heart disease of native coronary artery without angina pectoris: Secondary | ICD-10-CM | POA: Diagnosis present

## 2016-05-15 DIAGNOSIS — R079 Chest pain, unspecified: Secondary | ICD-10-CM | POA: Diagnosis not present

## 2016-05-15 DIAGNOSIS — I319 Disease of pericardium, unspecified: Secondary | ICD-10-CM | POA: Diagnosis not present

## 2016-05-15 LAB — POCT I-STAT 3, ART BLOOD GAS (G3+)
ACID-BASE DEFICIT: 2 mmol/L (ref 0.0–2.0)
Acid-Base Excess: 1 mmol/L (ref 0.0–2.0)
Acid-Base Excess: 1 mmol/L (ref 0.0–2.0)
Acid-base deficit: 4 mmol/L — ABNORMAL HIGH (ref 0.0–2.0)
Acid-base deficit: 1 mmol/L (ref 0.0–2.0)
Acid-base deficit: 2 mmol/L (ref 0.0–2.0)
Acid-base deficit: 4 mmol/L — ABNORMAL HIGH (ref 0.0–2.0)
Acid-base deficit: 6 mmol/L — ABNORMAL HIGH (ref 0.0–2.0)
Acid-base deficit: 4 mmol/L — ABNORMAL HIGH (ref 0.0–2.0)
BICARBONATE: 23 meq/L (ref 20.0–24.0)
BICARBONATE: 23.2 meq/L (ref 20.0–24.0)
BICARBONATE: 25.3 meq/L — AB (ref 20.0–24.0)
BICARBONATE: 27.2 meq/L — AB (ref 20.0–24.0)
Bicarbonate: 24.3 mEq/L — ABNORMAL HIGH (ref 20.0–24.0)
Bicarbonate: 23.1 mEq/L (ref 20.0–24.0)
Bicarbonate: 20.8 mEq/L (ref 20.0–24.0)
Bicarbonate: 19.7 mEq/L — ABNORMAL LOW (ref 20.0–24.0)
Bicarbonate: 21.1 mEq/L (ref 20.0–24.0)
O2 SAT: 100 %
O2 Saturation: 100 %
O2 Saturation: 100 %
O2 Saturation: 100 %
O2 Saturation: 86 %
O2 Saturation: 98 %
O2 Saturation: 98 %
O2 Saturation: 98 %
O2 Saturation: 99 %
PCO2 ART: 24.8 mmHg — AB (ref 35.0–45.0)
PH ART: 7.356 (ref 7.350–7.450)
PH ART: 7.574 — AB (ref 7.350–7.450)
PO2 ART: 335 mmHg — AB (ref 80.0–100.0)
PO2 ART: 211 mmHg — AB (ref 80.0–100.0)
Patient temperature: 35.6
Patient temperature: 36.6
Patient temperature: 37
Patient temperature: 37
Patient temperature: 36.5
TCO2: 21 mmol/L (ref 0–100)
TCO2: 22 mmol/L (ref 0–100)
TCO2: 22 mmol/L (ref 0–100)
TCO2: 24 mmol/L (ref 0–100)
TCO2: 24 mmol/L (ref 0–100)
TCO2: 24 mmol/L (ref 0–100)
TCO2: 26 mmol/L (ref 0–100)
TCO2: 28 mmol/L (ref 0–100)
TCO2: 29 mmol/L (ref 0–100)
pCO2 arterial: 34.1 mmHg — ABNORMAL LOW (ref 35.0–45.0)
pCO2 arterial: 34.7 mmHg — ABNORMAL LOW (ref 35.0–45.0)
pCO2 arterial: 39.2 mmHg (ref 35.0–45.0)
pCO2 arterial: 41.7 mmHg (ref 35.0–45.0)
pCO2 arterial: 42.3 mmHg (ref 35.0–45.0)
pCO2 arterial: 42.7 mmHg (ref 35.0–45.0)
pCO2 arterial: 48.5 mmHg — ABNORMAL HIGH (ref 35.0–45.0)
pCO2 arterial: 74.9 mmHg (ref 35.0–45.0)
pH, Arterial: 7.137 — CL (ref 7.350–7.450)
pH, Arterial: 7.308 — ABNORMAL LOW (ref 7.350–7.450)
pH, Arterial: 7.342 — ABNORMAL LOW (ref 7.350–7.450)
pH, Arterial: 7.345 — ABNORMAL LOW (ref 7.350–7.450)
pH, Arterial: 7.366 (ref 7.350–7.450)
pH, Arterial: 7.39 (ref 7.350–7.450)
pH, Arterial: 7.394 (ref 7.350–7.450)
pO2, Arterial: 106 mmHg — ABNORMAL HIGH (ref 80.0–100.0)
pO2, Arterial: 121 mmHg — ABNORMAL HIGH (ref 80.0–100.0)
pO2, Arterial: 274 mmHg — ABNORMAL HIGH (ref 80.0–100.0)
pO2, Arterial: 408 mmHg — ABNORMAL HIGH (ref 80.0–100.0)
pO2, Arterial: 56 mmHg — ABNORMAL LOW (ref 80.0–100.0)
pO2, Arterial: 95 mmHg (ref 80.0–100.0)
pO2, Arterial: 97 mmHg (ref 80.0–100.0)

## 2016-05-15 LAB — PREPARE CRYOPRECIPITATE
UNIT DIVISION: 0
Unit division: 0

## 2016-05-15 LAB — PREPARE PLATELET PHERESIS
UNIT DIVISION: 0
Unit division: 0

## 2016-05-15 LAB — POCT I-STAT, CHEM 8
BUN: 10 mg/dL (ref 6–20)
BUN: 10 mg/dL (ref 6–20)
BUN: 11 mg/dL (ref 6–20)
BUN: 11 mg/dL (ref 6–20)
BUN: 11 mg/dL (ref 6–20)
BUN: 11 mg/dL (ref 6–20)
BUN: 12 mg/dL (ref 6–20)
BUN: 12 mg/dL (ref 6–20)
CALCIUM ION: 1.08 mmol/L — AB (ref 1.12–1.23)
CALCIUM ION: 1 mmol/L — AB (ref 1.12–1.23)
CHLORIDE: 100 mmol/L — AB (ref 101–111)
CHLORIDE: 101 mmol/L (ref 101–111)
CHLORIDE: 101 mmol/L (ref 101–111)
CHLORIDE: 103 mmol/L (ref 101–111)
CHLORIDE: 104 mmol/L (ref 101–111)
CHLORIDE: 97 mmol/L — AB (ref 101–111)
CREATININE: 0.6 mg/dL — AB (ref 0.61–1.24)
CREATININE: 0.6 mg/dL — AB (ref 0.61–1.24)
CREATININE: 0.6 mg/dL — AB (ref 0.61–1.24)
CREATININE: 0.7 mg/dL (ref 0.61–1.24)
CREATININE: 0.7 mg/dL (ref 0.61–1.24)
Calcium, Ion: 0.8 mmol/L — ABNORMAL LOW (ref 1.12–1.23)
Calcium, Ion: 0.99 mmol/L — ABNORMAL LOW (ref 1.12–1.23)
Calcium, Ion: 1.08 mmol/L — ABNORMAL LOW (ref 1.12–1.23)
Calcium, Ion: 1.18 mmol/L (ref 1.12–1.23)
Calcium, Ion: 1.2 mmol/L (ref 1.12–1.23)
Calcium, Ion: 1.2 mmol/L (ref 1.12–1.23)
Chloride: 104 mmol/L (ref 101–111)
Chloride: 102 mmol/L (ref 101–111)
Creatinine, Ser: 0.7 mg/dL (ref 0.61–1.24)
Creatinine, Ser: 0.7 mg/dL (ref 0.61–1.24)
Creatinine, Ser: 0.5 mg/dL — ABNORMAL LOW (ref 0.61–1.24)
GLUCOSE: 113 mg/dL — AB (ref 65–99)
GLUCOSE: 132 mg/dL — AB (ref 65–99)
GLUCOSE: 137 mg/dL — AB (ref 65–99)
GLUCOSE: 182 mg/dL — AB (ref 65–99)
GLUCOSE: 187 mg/dL — AB (ref 65–99)
Glucose, Bld: 134 mg/dL — ABNORMAL HIGH (ref 65–99)
Glucose, Bld: 100 mg/dL — ABNORMAL HIGH (ref 65–99)
Glucose, Bld: 109 mg/dL — ABNORMAL HIGH (ref 65–99)
HCT: 24 % — ABNORMAL LOW (ref 39.0–52.0)
HCT: 24 % — ABNORMAL LOW (ref 39.0–52.0)
HCT: 24 % — ABNORMAL LOW (ref 39.0–52.0)
HCT: 25 % — ABNORMAL LOW (ref 39.0–52.0)
HCT: 33 % — ABNORMAL LOW (ref 39.0–52.0)
HEMATOCRIT: 31 % — AB (ref 39.0–52.0)
HEMATOCRIT: 23 % — AB (ref 39.0–52.0)
HEMATOCRIT: 28 % — AB (ref 39.0–52.0)
HEMOGLOBIN: 9.5 g/dL — AB (ref 13.0–17.0)
HEMOGLOBIN: 8.2 g/dL — AB (ref 13.0–17.0)
Hemoglobin: 10.5 g/dL — ABNORMAL LOW (ref 13.0–17.0)
Hemoglobin: 11.2 g/dL — ABNORMAL LOW (ref 13.0–17.0)
Hemoglobin: 7.8 g/dL — ABNORMAL LOW (ref 13.0–17.0)
Hemoglobin: 8.2 g/dL — ABNORMAL LOW (ref 13.0–17.0)
Hemoglobin: 8.2 g/dL — ABNORMAL LOW (ref 13.0–17.0)
Hemoglobin: 8.5 g/dL — ABNORMAL LOW (ref 13.0–17.0)
POTASSIUM: 3.8 mmol/L (ref 3.5–5.1)
POTASSIUM: 3.8 mmol/L (ref 3.5–5.1)
POTASSIUM: 4.3 mmol/L (ref 3.5–5.1)
POTASSIUM: 4.6 mmol/L (ref 3.5–5.1)
POTASSIUM: 4.7 mmol/L (ref 3.5–5.1)
POTASSIUM: 5.1 mmol/L (ref 3.5–5.1)
Potassium: 4.3 mmol/L (ref 3.5–5.1)
Potassium: 4.5 mmol/L (ref 3.5–5.1)
SODIUM: 140 mmol/L (ref 135–145)
SODIUM: 140 mmol/L (ref 135–145)
Sodium: 133 mmol/L — ABNORMAL LOW (ref 135–145)
Sodium: 137 mmol/L (ref 135–145)
Sodium: 137 mmol/L (ref 135–145)
Sodium: 139 mmol/L (ref 135–145)
Sodium: 140 mmol/L (ref 135–145)
Sodium: 141 mmol/L (ref 135–145)
TCO2: 23 mmol/L (ref 0–100)
TCO2: 23 mmol/L (ref 0–100)
TCO2: 26 mmol/L (ref 0–100)
TCO2: 26 mmol/L (ref 0–100)
TCO2: 27 mmol/L (ref 0–100)
TCO2: 27 mmol/L (ref 0–100)
TCO2: 27 mmol/L (ref 0–100)
TCO2: 27 mmol/L (ref 0–100)

## 2016-05-15 LAB — BASIC METABOLIC PANEL
Anion gap: 5 (ref 5–15)
BUN: 10 mg/dL (ref 6–20)
CO2: 24 mmol/L (ref 22–32)
Calcium: 7.6 mg/dL — ABNORMAL LOW (ref 8.9–10.3)
Chloride: 109 mmol/L (ref 101–111)
Creatinine, Ser: 0.9 mg/dL (ref 0.61–1.24)
GFR calc Af Amer: >60 mL/min (ref >60)
GFR calc non Af Amer: >60 mL/min (ref >60)
Glucose, Bld: 136 mg/dL — ABNORMAL HIGH (ref 65–99)
Potassium: 4.4 mmol/L (ref 3.5–5.1)
Sodium: 138 mmol/L (ref 135–145)

## 2016-05-15 LAB — CARBOXYHEMOGLOBIN
Carboxyhemoglobin: 1.4 % (ref 0.5–1.5)
Methemoglobin: 0.8 % (ref 0.0–1.5)
O2 Saturation: 62 %
Total hemoglobin: 8.5 g/dL — ABNORMAL LOW (ref 13.5–18.0)

## 2016-05-15 LAB — CBC
HCT: 29.6 % — ABNORMAL LOW (ref 39.0–52.0)
HCT: 28.2 % — ABNORMAL LOW (ref 39.0–52.0)
Hemoglobin: 9.9 g/dL — ABNORMAL LOW (ref 13.0–17.0)
Hemoglobin: 9.5 g/dL — ABNORMAL LOW (ref 13.0–17.0)
MCH: 29.1 pg (ref 26.0–34.0)
MCH: 29.5 pg (ref 26.0–34.0)
MCHC: 33.4 g/dL (ref 30.0–36.0)
MCHC: 33.7 g/dL (ref 30.0–36.0)
MCV: 87.1 fL (ref 78.0–100.0)
MCV: 87.6 fL (ref 78.0–100.0)
Platelets: 194 10*3/uL (ref 150–400)
Platelets: 195 10*3/uL (ref 150–400)
RBC: 3.4 MIL/uL — ABNORMAL LOW (ref 4.22–5.81)
RBC: 3.22 MIL/uL — ABNORMAL LOW (ref 4.22–5.81)
RDW: 12.7 % (ref 11.5–15.5)
RDW: 13 % (ref 11.5–15.5)
WBC: 13 10*3/uL — ABNORMAL HIGH (ref 4.0–10.5)
WBC: 13.4 10*3/uL — ABNORMAL HIGH (ref 4.0–10.5)

## 2016-05-15 LAB — POCT I-STAT 4, (NA,K, GLUC, HGB,HCT)
Glucose, Bld: 147 mg/dL — ABNORMAL HIGH (ref 65–99)
HCT: 29 % — ABNORMAL LOW (ref 39.0–52.0)
Hemoglobin: 9.9 g/dL — ABNORMAL LOW (ref 13.0–17.0)
Potassium: 4 mmol/L (ref 3.5–5.1)
Sodium: 142 mmol/L (ref 135–145)

## 2016-05-15 LAB — PROTIME-INR
INR: 1.37
Prothrombin Time: 17 seconds — ABNORMAL HIGH (ref 11.4–15.2)

## 2016-05-15 LAB — GLUCOSE, CAPILLARY
Glucose-Capillary: 144 mg/dL — ABNORMAL HIGH (ref 65–99)
Glucose-Capillary: 141 mg/dL — ABNORMAL HIGH (ref 65–99)
Glucose-Capillary: 167 mg/dL — ABNORMAL HIGH (ref 65–99)
Glucose-Capillary: 148 mg/dL — ABNORMAL HIGH (ref 65–99)
Glucose-Capillary: 138 mg/dL — ABNORMAL HIGH (ref 65–99)

## 2016-05-15 LAB — MAGNESIUM: Magnesium: 2.6 mg/dL — ABNORMAL HIGH (ref 1.7–2.4)

## 2016-05-15 LAB — MRSA PCR SCREENING: MRSA by PCR: NEGATIVE

## 2016-05-15 LAB — APTT: aPTT: 33 seconds (ref 24–36)

## 2016-05-15 LAB — POCT ACTIVATED CLOTTING TIME: Activated Clotting Time: 505 seconds

## 2016-05-15 MED ORDER — CETYLPYRIDINIUM CHLORIDE 0.05 % MT LIQD
7.0000 mL | Freq: Two times a day (BID) | OROMUCOSAL | Status: DC
Start: 2016-05-15 — End: 2016-05-16
  Administered 2016-05-15: 7 mL via OROMUCOSAL

## 2016-05-15 MED ORDER — LACTATED RINGERS IV SOLN
INTRAVENOUS | Status: DC
  Administered 2016-05-15 – 2016-05-16 (×3): via INTRAVENOUS

## 2016-05-15 MED ORDER — METOPROLOL TARTRATE 12.5 MG HALF TABLET
12.5000 mg | ORAL_TABLET | Freq: Two times a day (BID) | ORAL | Status: DC
  Administered 2016-05-16: 12.5 mg via ORAL
  Filled 2016-05-15: qty 1

## 2016-05-15 MED ORDER — SODIUM CHLORIDE 0.9 % IV SOLN
INTRAVENOUS | Status: DC
Start: 2016-05-15 — End: 2016-05-20
  Administered 2016-05-15 – 2016-05-19 (×4): via INTRAVENOUS

## 2016-05-15 MED ORDER — OXYCODONE HCL 5 MG PO TABS
5.0000 mg | ORAL_TABLET | ORAL | Status: DC | PRN
  Administered 2016-05-15 – 2016-05-16 (×3): 10 mg via ORAL
  Filled 2016-05-15 (×3): qty 2

## 2016-05-15 MED ORDER — ONDANSETRON HCL 4 MG/2ML IJ SOLN: 4.0000 mg | Freq: Four times a day (QID) | INTRAMUSCULAR | Status: DC | PRN

## 2016-05-15 MED ORDER — ACETAMINOPHEN 160 MG/5ML PO SOLN: 650.0000 mg | Freq: Once | ORAL | Status: DC

## 2016-05-15 MED ORDER — METOPROLOL TARTRATE 12.5 MG HALF TABLET: 12.5000 mg | ORAL_TABLET | Freq: Once | ORAL | Status: DC

## 2016-05-15 MED ORDER — MIDAZOLAM HCL 2 MG/2ML IJ SOLN
2.0000 mg | INTRAMUSCULAR | Status: DC | PRN
  Filled 2016-05-15: qty 2

## 2016-05-15 MED ORDER — TRAMADOL HCL 50 MG PO TABS: 50.0000 mg | ORAL_TABLET | ORAL | Status: DC | PRN

## 2016-05-15 MED ORDER — MAGNESIUM SULFATE 4 GM/100ML IV SOLN
4.0000 g | Freq: Once | INTRAVENOUS | Status: AC
  Administered 2016-05-15: 4 g via INTRAVENOUS
  Filled 2016-05-15: qty 100

## 2016-05-15 MED ORDER — POTASSIUM CHLORIDE 10 MEQ/50ML IV SOLN: 10.0000 meq | INTRAVENOUS | Status: AC

## 2016-05-15 MED ORDER — FAMOTIDINE IN NACL 20-0.9 MG/50ML-% IV SOLN
20.0000 mg | Freq: Two times a day (BID) | INTRAVENOUS | Status: AC
  Administered 2016-05-15 (×2): 20 mg via INTRAVENOUS
  Filled 2016-05-15: qty 50

## 2016-05-15 MED ORDER — DEXMEDETOMIDINE HCL IN NACL 200 MCG/50ML IV SOLN
0.0000 ug/kg/h | INTRAVENOUS | Status: DC
  Administered 2016-05-15: 0.6 ug/kg/h via INTRAVENOUS
  Filled 2016-05-15 (×2): qty 50

## 2016-05-15 MED ORDER — ALBUMIN HUMAN 5 % IV SOLN
250.0000 mL | INTRAVENOUS | Status: AC | PRN
  Administered 2016-05-15 (×4): 250 mL via INTRAVENOUS
  Filled 2016-05-15 (×2): qty 250

## 2016-05-15 MED ORDER — DOPAMINE-DEXTROSE 3.2-5 MG/ML-% IV SOLN: 0.0000 ug/kg/min | INTRAVENOUS | Status: DC

## 2016-05-15 MED ORDER — ANTISEPTIC ORAL RINSE SOLUTION (CORINZ)
7.0000 mL | Freq: Four times a day (QID) | OROMUCOSAL | Status: DC
  Administered 2016-05-15 (×3): 7 mL via OROMUCOSAL

## 2016-05-15 MED ORDER — ACETAMINOPHEN 325 MG PO TABS
650.0000 mg | ORAL_TABLET | Freq: Once | ORAL | Status: AC
  Administered 2016-05-15: 650 mg via ORAL
  Filled 2016-05-15: qty 2

## 2016-05-15 MED ORDER — SODIUM CHLORIDE 0.9% FLUSH: 3.0000 mL | INTRAVENOUS | Status: DC | PRN

## 2016-05-15 MED ORDER — VANCOMYCIN HCL IN DEXTROSE 1-5 GM/200ML-% IV SOLN
1000.0000 mg | Freq: Two times a day (BID) | INTRAVENOUS | Status: AC
  Administered 2016-05-15 – 2016-05-16 (×3): 1000 mg via INTRAVENOUS
  Filled 2016-05-15 (×3): qty 200

## 2016-05-15 MED ORDER — FUROSEMIDE 10 MG/ML IJ SOLN
20.0000 mg | Freq: Once | INTRAMUSCULAR | Status: AC
  Administered 2016-05-15: 20 mg via INTRAVENOUS
  Filled 2016-05-15: qty 2

## 2016-05-15 MED ORDER — LACTATED RINGERS IV SOLN
500.0000 mL | Freq: Once | INTRAVENOUS | Status: AC | PRN
  Administered 2016-05-15: 500 mL via INTRAVENOUS

## 2016-05-15 MED ORDER — SODIUM CHLORIDE 0.9 % IV SOLN
INTRAVENOUS | Status: DC
  Administered 2016-05-15: 16:00:00 via INTRAVENOUS
  Filled 2016-05-15 (×2): qty 2.5

## 2016-05-15 MED ORDER — PROTAMINE SULFATE 10 MG/ML IV SOLN
INTRAVENOUS | Status: DC | PRN
  Administered 2016-05-15: 300 mg via INTRAVENOUS

## 2016-05-15 MED ORDER — ASPIRIN 81 MG PO CHEW
324.0000 mg | CHEWABLE_TABLET | Freq: Every day | ORAL | Status: DC
  Filled 2016-05-15: qty 4

## 2016-05-15 MED ORDER — MORPHINE SULFATE (PF) 2 MG/ML IV SOLN
2.0000 mg | INTRAVENOUS | Status: DC | PRN
  Administered 2016-05-15 – 2016-05-16 (×4): 2 mg via INTRAVENOUS
  Administered 2016-05-16 (×2): 4 mg via INTRAVENOUS
  Administered 2016-05-17: 2 mg via INTRAVENOUS
  Filled 2016-05-15: qty 1
  Filled 2016-05-15: qty 2
  Filled 2016-05-15 (×3): qty 1
  Filled 2016-05-15: qty 2
  Filled 2016-05-15: qty 1

## 2016-05-15 MED ORDER — ACETAMINOPHEN 650 MG RE SUPP: 650.0000 mg | Freq: Once | RECTAL | Status: DC

## 2016-05-15 MED ORDER — SODIUM CHLORIDE 0.9 % IV SOLN
250.0000 mL | INTRAVENOUS | Status: DC
Start: 2016-05-16 — End: 2016-05-23

## 2016-05-15 MED ORDER — MORPHINE SULFATE (PF) 2 MG/ML IV SOLN
1.0000 mg | INTRAVENOUS | Status: AC | PRN
  Administered 2016-05-15 (×2): 2 mg via INTRAVENOUS
  Filled 2016-05-15: qty 2

## 2016-05-15 MED ORDER — ENOXAPARIN SODIUM 40 MG/0.4ML ~~LOC~~ SOLN
40.0000 mg | SUBCUTANEOUS | Status: DC
  Administered 2016-05-15: 40 mg via SUBCUTANEOUS
  Filled 2016-05-15: qty 0.4

## 2016-05-15 MED ORDER — LACTATED RINGERS IV SOLN: INTRAVENOUS | Status: DC

## 2016-05-15 MED ORDER — PANTOPRAZOLE SODIUM 40 MG PO TBEC
40.0000 mg | DELAYED_RELEASE_TABLET | Freq: Every day | ORAL | Status: DC
  Administered 2016-05-17 – 2016-05-23 (×7): 40 mg via ORAL
  Filled 2016-05-15 (×7): qty 1

## 2016-05-15 MED ORDER — NITROGLYCERIN IN D5W 200-5 MCG/ML-% IV SOLN: 0.0000 ug/min | INTRAVENOUS | Status: DC

## 2016-05-15 MED ORDER — METOPROLOL TARTRATE 5 MG/5ML IV SOLN
2.5000 mg | INTRAVENOUS | Status: DC | PRN
Start: 2016-05-15 — End: 2016-05-23

## 2016-05-15 MED ORDER — ASPIRIN EC 325 MG PO TBEC
325.0000 mg | DELAYED_RELEASE_TABLET | Freq: Every day | ORAL | Status: DC
  Administered 2016-05-16 – 2016-05-23 (×8): 325 mg via ORAL
  Filled 2016-05-15 (×8): qty 1

## 2016-05-15 MED ORDER — ACETAMINOPHEN 160 MG/5ML PO SOLN: 1000.0000 mg | Freq: Four times a day (QID) | ORAL | Status: DC

## 2016-05-15 MED ORDER — SIMVASTATIN 20 MG PO TABS
20.0000 mg | ORAL_TABLET | Freq: Every day | ORAL | Status: DC
Start: 2016-05-15 — End: 2016-05-23
  Administered 2016-05-15 – 2016-05-22 (×8): 20 mg via ORAL
  Filled 2016-05-15 (×8): qty 1

## 2016-05-15 MED ORDER — CHLORHEXIDINE GLUCONATE 0.12% ORAL RINSE (MEDLINE KIT)
15.0000 mL | Freq: Two times a day (BID) | OROMUCOSAL | Status: DC
  Administered 2016-05-15 (×2): 15 mL via OROMUCOSAL

## 2016-05-15 MED ORDER — SODIUM CHLORIDE 0.9% FLUSH
3.0000 mL | Freq: Two times a day (BID) | INTRAVENOUS | Status: DC
  Administered 2016-05-16 – 2016-05-19 (×6): 3 mL via INTRAVENOUS

## 2016-05-15 MED ORDER — DOCUSATE SODIUM 100 MG PO CAPS
200.0000 mg | ORAL_CAPSULE | Freq: Every day | ORAL | Status: DC
  Administered 2016-05-16 – 2016-05-22 (×7): 200 mg via ORAL
  Filled 2016-05-15 (×7): qty 2

## 2016-05-15 MED ORDER — BISACODYL 5 MG PO TBEC: 5.0000 mg | DELAYED_RELEASE_TABLET | Freq: Once | ORAL | Status: DC

## 2016-05-15 MED ORDER — METOCLOPRAMIDE HCL 5 MG/ML IJ SOLN
10.0000 mg | Freq: Four times a day (QID) | INTRAMUSCULAR | Status: AC
  Administered 2016-05-15 – 2016-05-19 (×20): 10 mg via INTRAVENOUS
  Filled 2016-05-15 (×18): qty 2

## 2016-05-15 MED ORDER — BISACODYL 10 MG RE SUPP: 10.0000 mg | Freq: Every day | RECTAL | Status: DC

## 2016-05-15 MED ORDER — INSULIN REGULAR BOLUS VIA INFUSION
0.0000 [IU] | Freq: Three times a day (TID) | INTRAVENOUS | Status: DC
  Filled 2016-05-15: qty 10

## 2016-05-15 MED ORDER — CHLORHEXIDINE GLUCONATE 0.12 % MT SOLN
15.0000 mL | OROMUCOSAL | Status: AC
  Administered 2016-05-15: 15 mL via OROMUCOSAL

## 2016-05-15 MED ORDER — TEMAZEPAM 15 MG PO CAPS: 15.0000 mg | ORAL_CAPSULE | Freq: Once | ORAL | Status: DC | PRN

## 2016-05-15 MED ORDER — SODIUM CHLORIDE 0.45 % IV SOLN
INTRAVENOUS | Status: DC | PRN
  Administered 2016-05-15: 02:00:00 via INTRAVENOUS

## 2016-05-15 MED ORDER — BISACODYL 5 MG PO TBEC
10.0000 mg | DELAYED_RELEASE_TABLET | Freq: Every day | ORAL | Status: DC
  Administered 2016-05-16 – 2016-05-20 (×5): 10 mg via ORAL
  Filled 2016-05-15 (×6): qty 2

## 2016-05-15 MED ORDER — ACETAMINOPHEN 500 MG PO TABS
1000.0000 mg | ORAL_TABLET | Freq: Four times a day (QID) | ORAL | Status: AC
  Administered 2016-05-15 – 2016-05-20 (×18): 1000 mg via ORAL
  Filled 2016-05-15 (×16): qty 2

## 2016-05-15 MED ORDER — LEVOFLOXACIN IN D5W 750 MG/150ML IV SOLN
750.0000 mg | INTRAVENOUS | Status: AC
  Administered 2016-05-15 – 2016-05-16 (×2): 750 mg via INTRAVENOUS
  Filled 2016-05-15 (×2): qty 150

## 2016-05-15 MED ORDER — CHLORHEXIDINE GLUCONATE CLOTH 2 % EX PADS: 6.0000 | MEDICATED_PAD | Freq: Once | CUTANEOUS | Status: DC

## 2016-05-15 MED ORDER — CHLORHEXIDINE GLUCONATE 0.12 % MT SOLN: 15.0000 mL | Freq: Once | OROMUCOSAL | Status: DC

## 2016-05-15 MED ORDER — METOPROLOL TARTRATE 25 MG/10 ML ORAL SUSPENSION: 12.5000 mg | Freq: Two times a day (BID) | ORAL | Status: DC

## 2016-05-15 MED FILL — Heparin Sodium (Porcine) Inj 1000 Unit/ML: INTRAMUSCULAR | Qty: 30 | Status: AC

## 2016-05-15 MED FILL — Magnesium Sulfate Inj 50%: INTRAMUSCULAR | Qty: 10 | Status: AC

## 2016-05-15 MED FILL — Potassium Chloride Inj 2 mEq/ML: INTRAVENOUS | Qty: 40 | Status: AC

## 2016-05-15 MED FILL — Heparin Sodium (Porcine) 2 Unit/ML in Sodium Chloride 0.9%: INTRAMUSCULAR | Qty: 500 | Status: AC

## 2016-05-15 NOTE — Care Management Important Message (Signed)
Important Message  Patient Details  Name: Patrick Fritz MRN: [id number] Date of Birth: Feb 09, 1951   Medicare Important Message Given:  Yes    Loann Quill 05/15/2016, 3:15 PM

## 2016-05-15 NOTE — Progress Notes (Signed)
Patient Name: Patrick Fritz Date of Encounter: 05/15/2016    SUBJECTIVE: Just back from the operating room. Surgical note reviewed. I am very thankful and appreciative of the excellent surgical procedure performed by Dr. Tharon Aquas Trigt!!  TELEMETRY:  Normal sinus rhythm Vitals:   05/15/16 0415 05/15/16 0430 05/15/16 0445 05/15/16 0500  BP:    95/62  Pulse: 89 85 85 85  Resp: 16 17 17 20   Temp: (!) 96.3 F (35.7 C) (!) 96.4 F (35.8 C) (!) 96.8 F (36 C) 97 F (36.1 C)  TempSrc:    Core (Comment)  SpO2: 100% 100% 100% 100%  Weight:      Height:        Intake/Output Summary (Last 24 hours) at 05/15/16 0506 Last data filed at 05/15/16 0500  Gross per 24 hour  Intake          8531.49 ml  Output             4180 ml  Net          4351.49 ml   LABS: Basic Metabolic Panel:  Recent Labs  05/14/16 1310  05/14/16 2302 05/15/16 0036 05/15/16 0216  NA 139  < > 139 141 142  K 3.8  < > 4.5 3.8 4.0  CL 107  < > 102 101  --   CO2 25  --   --   --   --   GLUCOSE 152*  < > 182* 187* 147*  BUN 12  < > 12 11  --   CREATININE 1.02  < > 0.70 0.70  --   CALCIUM 9.1  --   --   --   --   < > = values in this interval not displayed. CBC:  Recent Labs  05/14/16 1310  05/14/16 2255  05/15/16 0216 05/15/16 0228  WBC 8.2  --   --   --   --  13.0*  NEUTROABS 6.1  --   --   --   --   --   HGB 13.8  < > 8.0*  < > 9.9* 9.9*  HCT 40.9  < > 22.9*  < > 29.0* 29.6*  MCV 88.0  --   --   --   --  87.1  PLT 196  --  112*  --   --  194  < > = values in this interval not displayed. Cardiac Enzymes:  Recent Labs  05/14/16 1310  TROPONINI 0.20*   Fasting Lipid Panel:  Recent Labs  05/14/16 1310  CHOL 179  HDL 45  LDLCALC 101*  TRIG 165*  CHOLHDL 4.0   ECG:  Postop ECG reveals sinus rhythm, interventricular conduction delay, and new Q waves in 3 and aVF. No acute ST abnormality.   Radiology/Studies:  No new data  Physical Exam: Blood pressure 95/62, pulse 85,  temperature 97 F (36.1 C), temperature source Core (Comment), resp. rate 20, height 5\' 10"  (1.778 m), weight 245 lb (111.1 kg), SpO2 100 %. Weight change:   Wt Readings from Last 3 Encounters:  05/14/16 245 lb (111.1 kg)  12/17/13 244 lb (110.7 kg)   Bilateral radial access sites from catheterization yesterday are stable without evidence of hematoma and with wrist bands in place. Hands and fingers are warm and show evidence of good perfusion.  No pericardial friction rub  ASSESSMENT:  1. Status post complicated 2 vessel coronary bypass grafting and ascending aortic dissection repair by  Dr. Prescott Gum. 2. Status post inferior infarction complicated by interventional procedure related coronary dissection with proximal propagation into the ascending aorta causing aortic dissection. 3. Overall preserved LV function on preoperative echo.  Plan:  Continue to monitor the bilateral radial access sites evidence of bleeding/ischemia.  Signed, [name] 05/15/2016, 5:06 AM

## 2016-05-15 NOTE — Brief Op Note (Signed)
05/14/2016 - 05/15/2016  1:40 AM  PATIENT:  Patrick Fritz  65 y.o. male  PRE-OPERATIVE DIAGNOSIS:  MI  POST-OPERATIVE DIAGNOSIS:  CAD, Right Coronary Artery Dissection  PROCEDURE:  Procedure(s): CORONARY ARTERY BYPASS GRAFTING (CABG) times two with LIMA to LAD and right leg SVG to PDA,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 (N/A)  SURGEON:  Surgeon(s) and Role:    * Ivin Poot, MD - Primary  PHYSICIAN ASSISTANT: Sharen Heck  ASSISTANTS: Alcide Evener RNFA   ANESTHESIA:   general  EBL:  Total I/O In: [id number] [I.V.:2500; Blood:2334] Out: [id number] [Urine:1000; Blood:1755]  BLOOD ADMINISTERED:2 u CC PRBC, 2uFFP 2 u Plts  DRAINS:  2 MTs 2 PTs   LOCAL MEDICATIONS USED:  NONE  SPECIMEN:  Excision of dissected ascending aorta  DISPOSITION OF SPECIMEN:  PATHOLOGY  COUNTS:  YES  TOURNIQUET:  * No tourniquets in log *  DICTATION: .Dragon Dictation  PLAN OF CARE: Admit to inpatient   PATIENT DISPOSITION:  ICU - intubated and hemodynamically stable.   Delay start of Pharmacological VTE agent (>24hrs) due to surgical blood loss or risk of bleeding: yes

## 2016-05-15 NOTE — Transfer of Care (Signed)
Immediate Anesthesia Transfer of Care Note  Patient: Patrick Fritz  Procedure(s) Performed: Procedure(s): CORONARY ARTERY BYPASS GRAFTING (CABG) times two with LIMA to LAD and right leg SVG to PDA,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 (N/A)  Patient Location: SICU  Anesthesia Type:General  Level of Consciousness: sedated and Patient remains intubated per anesthesia plan  Airway & Oxygen Therapy: Patient remains intubated per anesthesia plan and Patient placed on Ventilator (see vital sign flow sheet for setting)  Post-op Assessment: Report given to RN and Post -op Vital signs reviewed and stable  Post vital signs: Reviewed and stable  Last Vitals:  Vitals:   05/14/16 1539 05/14/16 1544  BP:    Pulse: (!) 0 (!) 0  Resp: (!) 22 (!) 22  Temp:      Last Pain:  Vitals:   05/14/16 1450  TempSrc:   PainSc: 3          Complications: No apparent anesthesia complications

## 2016-05-15 NOTE — Progress Notes (Signed)
[facility] BRIEF SICU PROGRESS NOTE  1 Day Post-Op  S/P Procedure(s) (LRB): CORONARY ARTERY BYPASS GRAFTING (CABG) times two with LIMA to LAD and right leg SVG to PDA,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 (N/A)   Stable day NSR w/ stable BP Breathing comfortably w/ O2 sats 100% on 6 L/min UOP adequate  Plan: Continue current plan  Rexene Alberts, MD 05/15/2016 7:37 PM

## 2016-05-15 NOTE — Procedures (Signed)
Extubation Procedure Note  Patient Details:   Name: Patrick Fritz DOB: 11/11/1950 MRN: [id number]   Airway Documentation:     Evaluation  O2 sats: stable throughout Complications: No apparent complications Patient did tolerate procedure well. Bilateral Breath Sounds: Clear, Diminished   Yes  NIF -30, VC 1.L. Pt extubated to a 4lpm [name]  [name] 05/15/2016, 11:34 AM

## 2016-05-15 NOTE — Progress Notes (Signed)
1 Day Post-Op Procedure(s) (LRB): CORONARY ARTERY BYPASS GRAFTING (CABG) times two with LIMA to LAD and right leg SVG to PDA,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 (N/A) Subjective: Extubated after emerg CABG , repair of Type I aortic dissection Neuro intact Objective: Vital signs in last 24 hours: Temp:  [95.9 F (35.5 C)-98.8 F (37.1 C)] 97.7 F (36.5 C) (07/31 1630) Pulse Rate:  [81-100] 89 (07/31 1815) Cardiac Rhythm: Atrial paced (07/31 1800) Resp:  [12-29] 28 (07/31 1815) BP: (88-116)/(55-77) 96/55 (07/31 1800) SpO2:  [90 %-100 %] 100 % (07/31 1815) Arterial Line BP: (75-138)/(47-81) 86/50 (07/31 1815) FiO2 (%):  [40 %-50 %] 40 % (07/31 1125) Weight:  [244 lb 14.9 oz (111.1 kg)] 244 lb 14.9 oz (111.1 kg) (07/31 1715)  Hemodynamic parameters for last 24 hours: PAP: (28-41)/(14-30) 35/20 CVP:  [10 mmHg-58 mmHg] 17 mmHg CO:  [3 L/min-6.7 L/min] 5.3 L/min CI:  [1.3 L/min/m2-2.9 L/min/m2] 2.3 L/min/m2  Intake/Output from previous day: 07/30 0701 - 07/31 0700 In: 9332.8 [I.V.:5358.8; HL:174265; NG/GT:30; IV Piggyback:1610] Out: [id number] [Urine:2335; Blood:1755; Chest Tube:260] Intake/Output this shift: Total I/O In: 2082 [P.O.:120; I.V.:1282; NG/GT:30; IV Piggyback:650] Out: 1085 [Urine:675; Chest Tube:410]       Exam    General- alert and comfortable, weak   Lungs- clear without rales, wheezes   Cor- regular rate and rhythm, no murmur ,  A-paced   Abdomen- soft, non-tender   Extremities - warm, non-tender, minimal edema   Neuro- oriented, appropriate, no focal weakness   Lab Results:  Recent Labs  05/15/16 0228 05/15/16 0650  WBC 13.0* 13.4*  HGB 9.9* 9.5*  HCT 29.6* 28.2*  PLT 194 195   BMET:  Recent Labs  05/14/16 1310  05/15/16 0036 05/15/16 0216 05/15/16 0650  NA 139  < > 141 142 138  K 3.8  < > 3.8 4.0 4.4  CL 107  < > 101  --  109  CO2 25  --   --   --  24  GLUCOSE 152*  < > 187* 147* 136*  BUN 12  < > 11  --  10  CREATININE 1.02  < >  0.70  --  0.90  CALCIUM 9.1  --   --   --  7.6*  < > = values in this interval not displayed.  PT/INR:  Recent Labs  05/15/16 0228  LABPROT 17.0*  INR 1.37   ABG    Component Value Date/Time   PHART 7.390 05/15/2016 1548   HCO3 21.1 05/15/2016 1548   TCO2 22 05/15/2016 1548   ACIDBASEDEF 4.0 (H) 05/15/2016 1548   O2SAT 62.0 05/15/2016 1550   CBG (last 3)   Recent Labs  05/15/16 0500 05/15/16 0557 05/15/16 0657  GLUCAP 167* 148* 138*    Assessment/Plan: S/P Procedure(s) (LRB): CORONARY ARTERY BYPASS GRAFTING (CABG) times two with LIMA to LAD and right leg SVG to PDA,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 (N/A) Mobilize Diuresis Diabetes control See progression orders start Lovenox for hx of PE- pt on preop Xarelto   LOS: 0 days    Tharon Aquas Trigt III 05/15/2016

## 2016-05-15 NOTE — Progress Notes (Signed)
Removed 6 french glidesheath from right radial artery and left radial artery.  TR band placed on both sites with 12 cc of air in both.  Both sites look good with no hematoma.

## 2016-05-16 ENCOUNTER — Inpatient Hospital Stay (HOSPITAL_COMMUNITY): Payer: Medicare Other

## 2016-05-16 ENCOUNTER — Encounter (HOSPITAL_COMMUNITY): Payer: Self-pay | Admitting: Cardiothoracic Surgery

## 2016-05-16 LAB — POCT I-STAT, CHEM 8
BUN: 16 mg/dL (ref 6–20)
Calcium, Ion: 1.15 mmol/L (ref 1.12–1.23)
Chloride: 95 mmol/L — ABNORMAL LOW (ref 101–111)
Creatinine, Ser: 0.9 mg/dL (ref 0.61–1.24)
Glucose, Bld: 167 mg/dL — ABNORMAL HIGH (ref 65–99)
HCT: 25 % — ABNORMAL LOW (ref 39.0–52.0)
Hemoglobin: 8.5 g/dL — ABNORMAL LOW (ref 13.0–17.0)
Potassium: 4.2 mmol/L (ref 3.5–5.1)
Sodium: 135 mmol/L (ref 135–145)
TCO2: 25 mmol/L (ref 0–100)

## 2016-05-16 LAB — CBC
HCT: 24 % — ABNORMAL LOW (ref 39.0–52.0)
HCT: 24.2 % — ABNORMAL LOW (ref 39.0–52.0)
HCT: 24.9 % — ABNORMAL LOW (ref 39.0–52.0)
Hemoglobin: 8.2 g/dL — ABNORMAL LOW (ref 13.0–17.0)
Hemoglobin: 8.3 g/dL — ABNORMAL LOW (ref 13.0–17.0)
Hemoglobin: 8.3 g/dL — ABNORMAL LOW (ref 13.0–17.0)
MCH: 29.3 pg (ref 26.0–34.0)
MCH: 29.6 pg (ref 26.0–34.0)
MCH: 30.3 pg (ref 26.0–34.0)
MCHC: 33.3 g/dL (ref 30.0–36.0)
MCHC: 33.9 g/dL (ref 30.0–36.0)
MCHC: 34.6 g/dL (ref 30.0–36.0)
MCV: 87.4 fL (ref 78.0–100.0)
MCV: 87.6 fL (ref 78.0–100.0)
MCV: 88 fL (ref 78.0–100.0)
Platelets: 141 10*3/uL — ABNORMAL LOW (ref 150–400)
Platelets: 151 10*3/uL (ref 150–400)
Platelets: 156 10*3/uL (ref 150–400)
RBC: 2.74 MIL/uL — ABNORMAL LOW (ref 4.22–5.81)
RBC: 2.77 MIL/uL — ABNORMAL LOW (ref 4.22–5.81)
RBC: 2.83 MIL/uL — ABNORMAL LOW (ref 4.22–5.81)
RDW: 13.2 % (ref 11.5–15.5)
RDW: 13.2 % (ref 11.5–15.5)
RDW: 13.2 % (ref 11.5–15.5)
WBC: 13.4 10*3/uL — ABNORMAL HIGH (ref 4.0–10.5)
WBC: 13.9 10*3/uL — ABNORMAL HIGH (ref 4.0–10.5)
WBC: 15.8 10*3/uL — ABNORMAL HIGH (ref 4.0–10.5)

## 2016-05-16 LAB — GLUCOSE, CAPILLARY
Glucose-Capillary: 110 mg/dL — ABNORMAL HIGH (ref 65–99)
Glucose-Capillary: 115 mg/dL — ABNORMAL HIGH (ref 65–99)
Glucose-Capillary: 117 mg/dL — ABNORMAL HIGH (ref 65–99)
Glucose-Capillary: 118 mg/dL — ABNORMAL HIGH (ref 65–99)
Glucose-Capillary: 120 mg/dL — ABNORMAL HIGH (ref 65–99)
Glucose-Capillary: 121 mg/dL — ABNORMAL HIGH (ref 65–99)
Glucose-Capillary: 122 mg/dL — ABNORMAL HIGH (ref 65–99)
Glucose-Capillary: 123 mg/dL — ABNORMAL HIGH (ref 65–99)
Glucose-Capillary: 125 mg/dL — ABNORMAL HIGH (ref 65–99)
Glucose-Capillary: 128 mg/dL — ABNORMAL HIGH (ref 65–99)
Glucose-Capillary: 129 mg/dL — ABNORMAL HIGH (ref 65–99)
Glucose-Capillary: 130 mg/dL — ABNORMAL HIGH (ref 65–99)
Glucose-Capillary: 133 mg/dL — ABNORMAL HIGH (ref 65–99)
Glucose-Capillary: 134 mg/dL — ABNORMAL HIGH (ref 65–99)
Glucose-Capillary: 134 mg/dL — ABNORMAL HIGH (ref 65–99)
Glucose-Capillary: 143 mg/dL — ABNORMAL HIGH (ref 65–99)
Glucose-Capillary: 148 mg/dL — ABNORMAL HIGH (ref 65–99)
Glucose-Capillary: 150 mg/dL — ABNORMAL HIGH (ref 65–99)
Glucose-Capillary: 155 mg/dL — ABNORMAL HIGH (ref 65–99)
Glucose-Capillary: 182 mg/dL — ABNORMAL HIGH (ref 65–99)
Glucose-Capillary: 76 mg/dL (ref 65–99)
Glucose-Capillary: 89 mg/dL (ref 65–99)
Glucose-Capillary: 96 mg/dL (ref 65–99)
Glucose-Capillary: 98 mg/dL (ref 65–99)

## 2016-05-16 LAB — PROTIME-INR
INR: 1.35
Prothrombin Time: 16.8 seconds — ABNORMAL HIGH (ref 11.4–15.2)

## 2016-05-16 LAB — BASIC METABOLIC PANEL
Anion gap: 5 (ref 5–15)
BUN: 13 mg/dL (ref 6–20)
CO2: 24 mmol/L (ref 22–32)
Calcium: 7.7 mg/dL — ABNORMAL LOW (ref 8.9–10.3)
Chloride: 107 mmol/L (ref 101–111)
Creatinine, Ser: 0.98 mg/dL (ref 0.61–1.24)
GFR calc Af Amer: >60 mL/min (ref >60)
GFR calc non Af Amer: >60 mL/min (ref >60)
Glucose, Bld: 119 mg/dL — ABNORMAL HIGH (ref 65–99)
Potassium: 3.9 mmol/L (ref 3.5–5.1)
Sodium: 136 mmol/L (ref 135–145)

## 2016-05-16 LAB — COMPREHENSIVE METABOLIC PANEL
ALT: 43 U/L (ref 17–63)
AST: 152 U/L — ABNORMAL HIGH (ref 15–41)
Albumin: 2.9 g/dL — ABNORMAL LOW (ref 3.5–5.0)
Alkaline Phosphatase: 36 U/L — ABNORMAL LOW (ref 38–126)
Anion gap: 5 (ref 5–15)
BUN: 13 mg/dL (ref 6–20)
CO2: 24 mmol/L (ref 22–32)
Calcium: 7.7 mg/dL — ABNORMAL LOW (ref 8.9–10.3)
Chloride: 106 mmol/L (ref 101–111)
Creatinine, Ser: 1.01 mg/dL (ref 0.61–1.24)
GFR calc Af Amer: >60 mL/min (ref >60)
GFR calc non Af Amer: >60 mL/min (ref >60)
Glucose, Bld: 144 mg/dL — ABNORMAL HIGH (ref 65–99)
Potassium: 4 mmol/L (ref 3.5–5.1)
Sodium: 135 mmol/L (ref 135–145)
Total Bilirubin: 0.9 mg/dL (ref 0.3–1.2)
Total Protein: 4.5 g/dL — ABNORMAL LOW (ref 6.5–8.1)

## 2016-05-16 LAB — PREPARE FRESH FROZEN PLASMA
UNIT DIVISION: 0
UNIT DIVISION: 0

## 2016-05-16 LAB — CARBOXYHEMOGLOBIN
CARBOXYHEMOGLOBIN: 1.2 % (ref 0.5–1.5)
Methemoglobin: 0.7 % (ref 0.0–1.5)
O2 SAT: 67.6 %
Total hemoglobin: 15.2 g/dL (ref 13.5–18.0)

## 2016-05-16 LAB — CREATININE, SERUM
Creatinine, Ser: 1.06 mg/dL (ref 0.61–1.24)
GFR calc Af Amer: >60 mL/min (ref >60)
GFR calc non Af Amer: >60 mL/min (ref >60)

## 2016-05-16 LAB — MAGNESIUM: Magnesium: 1.9 mg/dL (ref 1.7–2.4)

## 2016-05-16 MED ORDER — FUROSEMIDE 10 MG/ML IJ SOLN: 20.0000 mg | Freq: Two times a day (BID) | INTRAMUSCULAR | Status: DC

## 2016-05-16 MED ORDER — FUROSEMIDE 10 MG/ML IJ SOLN
8.0000 mg/h | INTRAVENOUS | Status: DC
  Administered 2016-05-16: 8 mg/h via INTRAVENOUS
  Filled 2016-05-16 (×3): qty 25

## 2016-05-16 MED ORDER — INSULIN ASPART 100 UNIT/ML ~~LOC~~ SOLN
0.0000 [IU] | SUBCUTANEOUS | Status: DC
  Administered 2016-05-16 (×2): 2 [IU] via SUBCUTANEOUS
  Administered 2016-05-16: 4 [IU] via SUBCUTANEOUS
  Administered 2016-05-16 – 2016-05-17 (×7): 2 [IU] via SUBCUTANEOUS

## 2016-05-16 MED FILL — Heparin Sodium (Porcine) Inj 1000 Unit/ML: INTRAMUSCULAR | Qty: 20 | Status: AC

## 2016-05-16 MED FILL — Calcium Chloride Inj 10%: INTRAVENOUS | Qty: 10 | Status: AC

## 2016-05-16 MED FILL — Sodium Chloride IV Soln 0.9%: INTRAVENOUS | Qty: 3000 | Status: AC

## 2016-05-16 MED FILL — Mannitol IV Soln 20%: INTRAVENOUS | Qty: 500 | Status: AC

## 2016-05-16 MED FILL — Sodium Bicarbonate IV Soln 8.4%: INTRAVENOUS | Qty: 100 | Status: AC

## 2016-05-16 MED FILL — Electrolyte-R (PH 7.4) Solution: INTRAVENOUS | Qty: 4000 | Status: AC

## 2016-05-16 MED FILL — Lidocaine HCl IV Inj 20 MG/ML: INTRAVENOUS | Qty: 5 | Status: AC

## 2016-05-16 NOTE — Progress Notes (Signed)
2 Days Post-Op Procedure(s) (LRB): CORONARY ARTERY BYPASS GRAFTING (CABG) times two with LIMA to LAD and right leg SVG to PDA,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 (N/A) Subjective: Stable day out of bed to chair Maintaining sinus rhythm Drips weaned down to milrinone 0.25 Lasix drip started for volume retention-urine output greater than 100 cc/h Chest tube drainage is decreasing  Objective: Vital signs in last 24 hours: Temp:  [97.7 F (36.5 C)-99 F (37.2 C)] 99 F (37.2 C) (08/01 1939) Pulse Rate:  [88-93] 93 (08/01 1800) Cardiac Rhythm: Atrial paced (08/01 1600) Resp:  [13-28] 18 (08/01 2038) BP: (88-132)/(56-81) 102/63 (08/01 1800) SpO2:  [88 %-100 %] 96 % (08/01 2038) Arterial Line BP: (88-132)/(42-85) 107/61 (08/01 0900) Weight:  [274 lb 14.6 oz (124.7 kg)] 274 lb 14.6 oz (124.7 kg) (08/01 0438)  Hemodynamic parameters for last 24 hours:  stable  Intake/Output from previous day: 07/31 0701 - 08/01 0700 In: 3182.6 [P.O.:120; I.V.:2182.6; NG/GT:30; IV Piggyback:850] Out: 2138 [Urine:1138; Chest Tube:1000] Intake/Output this shift: No intake/output data recorded.      Exam    General- alert and comfortable   Lungs- clear without rales, wheezes   Cor- regular rate and rhythm, no murmur , gallop   Abdomen- soft, non-tender   Extremities - warm, non-tender, minimal edema   Neuro- oriented, appropriate, no focal weakness    Lab Results:  Recent Labs  05/16/16 0320 05/16/16 1622 05/16/16 1810  WBC 13.9*  --  13.4*  HGB 8.2* 8.5* 8.3*  HCT 24.2* 25.0* 24.9*  PLT 156  --  141*   BMET:  Recent Labs  05/16/16 0000 05/16/16 0320 05/16/16 1622 05/16/16 1810  NA 136 135 135  --   K 3.9 4.0 4.2  --   CL 107 106 95*  --   CO2 24 24  --   --   GLUCOSE 119* 144* 167*  --   BUN 13 13 16   --   CREATININE 0.98 1.01 0.90 1.06  CALCIUM 7.7* 7.7*  --   --     PT/INR:  Recent Labs  05/16/16 0320  LABPROT 16.8*  INR 1.35   ABG    Component Value  Date/Time   PHART 7.390 05/15/2016 1548   HCO3 21.1 05/15/2016 1548   TCO2 25 05/16/2016 1622   ACIDBASEDEF 4.0 (H) 05/15/2016 1548   O2SAT 67.6 05/16/2016 0350   CBG (last 3)   Recent Labs  05/16/16 1219 05/16/16 1614 05/16/16 1927  GLUCAP 148* 182* 143*    Assessment/Plan: S/P Procedure(s) (LRB): CORONARY ARTERY BYPASS GRAFTING (CABG) times two with LIMA to LAD and right leg SVG to PDA,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 (N/A) Start Lovenox for history of DVT and PE tomorrow   LOS: 1 day    Patrick Fritz 05/16/2016

## 2016-05-16 NOTE — Anesthesia Postprocedure Evaluation (Signed)
Anesthesia Post Note  Patient: Patrick Fritz  Procedure(s) Performed: Procedure(s) (LRB): CORONARY ARTERY BYPASS GRAFTING (CABG) times two with LIMA to LAD and right leg SVG to PDA,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 (N/A)  Patient location during evaluation: PACU Anesthesia Type: General Level of consciousness: awake and alert Pain management: pain level controlled Vital Signs Assessment: post-procedure vital signs reviewed and stable Respiratory status: spontaneous breathing, nonlabored ventilation, respiratory function stable and patient connected to nasal cannula oxygen Cardiovascular status: blood pressure returned to baseline and stable Postop Assessment: no signs of nausea or vomiting Anesthetic complications: no    Last Vitals:  Vitals:   05/16/16 1100 05/16/16 1200  BP: 115/63 (!) 101/59  Pulse: 89 89  Resp: (!) 24 (!) 24  Temp:  36.5 C    Last Pain:  Vitals:   05/16/16 1200  TempSrc:   PainSc: 0-No pain                 Manahil Vanzile,W. EDMOND

## 2016-05-17 ENCOUNTER — Inpatient Hospital Stay (HOSPITAL_COMMUNITY): Payer: Medicare Other

## 2016-05-17 DIAGNOSIS — I2111 ST elevation (STEMI) myocardial infarction involving right coronary artery: Secondary | ICD-10-CM

## 2016-05-17 LAB — POCT I-STAT, CHEM 8
BUN: 25 mg/dL — ABNORMAL HIGH (ref 6–20)
Calcium, Ion: 1.14 mmol/L (ref 1.12–1.23)
Chloride: 93 mmol/L — ABNORMAL LOW (ref 101–111)
Creatinine, Ser: 1.1 mg/dL (ref 0.61–1.24)
Glucose, Bld: 136 mg/dL — ABNORMAL HIGH (ref 65–99)
HCT: 24 % — ABNORMAL LOW (ref 39.0–52.0)
Hemoglobin: 8.2 g/dL — ABNORMAL LOW (ref 13.0–17.0)
Potassium: 3.8 mmol/L (ref 3.5–5.1)
Sodium: 134 mmol/L — ABNORMAL LOW (ref 135–145)
TCO2: 27 mmol/L (ref 0–100)

## 2016-05-17 LAB — GLUCOSE, CAPILLARY
Glucose-Capillary: 138 mg/dL — ABNORMAL HIGH (ref 65–99)
Glucose-Capillary: 136 mg/dL — ABNORMAL HIGH (ref 65–99)
Glucose-Capillary: 130 mg/dL — ABNORMAL HIGH (ref 65–99)
Glucose-Capillary: 116 mg/dL — ABNORMAL HIGH (ref 65–99)
Glucose-Capillary: 129 mg/dL — ABNORMAL HIGH (ref 65–99)

## 2016-05-17 LAB — COMPREHENSIVE METABOLIC PANEL
ALT: 45 U/L (ref 17–63)
AST: 90 U/L — ABNORMAL HIGH (ref 15–41)
Albumin: 2.7 g/dL — ABNORMAL LOW (ref 3.5–5.0)
Alkaline Phosphatase: 43 U/L (ref 38–126)
Anion gap: 7 (ref 5–15)
BUN: 19 mg/dL (ref 6–20)
CO2: 28 mmol/L (ref 22–32)
Calcium: 8 mg/dL — ABNORMAL LOW (ref 8.9–10.3)
Chloride: 98 mmol/L — ABNORMAL LOW (ref 101–111)
Creatinine, Ser: 1.12 mg/dL (ref 0.61–1.24)
GFR calc Af Amer: >60 mL/min (ref >60)
GFR calc non Af Amer: >60 mL/min (ref >60)
Glucose, Bld: 145 mg/dL — ABNORMAL HIGH (ref 65–99)
Potassium: 3.9 mmol/L (ref 3.5–5.1)
Sodium: 133 mmol/L — ABNORMAL LOW (ref 135–145)
Total Bilirubin: 0.7 mg/dL (ref 0.3–1.2)
Total Protein: 4.9 g/dL — ABNORMAL LOW (ref 6.5–8.1)

## 2016-05-17 LAB — CBC
HCT: 24.2 % — ABNORMAL LOW (ref 39.0–52.0)
Hemoglobin: 8 g/dL — ABNORMAL LOW (ref 13.0–17.0)
MCH: 29.2 pg (ref 26.0–34.0)
MCHC: 33.1 g/dL (ref 30.0–36.0)
MCV: 88.3 fL (ref 78.0–100.0)
Platelets: 127 10*3/uL — ABNORMAL LOW (ref 150–400)
RBC: 2.74 MIL/uL — ABNORMAL LOW (ref 4.22–5.81)
RDW: 13.4 % (ref 11.5–15.5)
WBC: 12.1 10*3/uL — ABNORMAL HIGH (ref 4.0–10.5)

## 2016-05-17 LAB — PROTIME-INR
INR: 1.41
Prothrombin Time: 17.4 seconds — ABNORMAL HIGH (ref 11.4–15.2)

## 2016-05-17 LAB — CARBOXYHEMOGLOBIN
CARBOXYHEMOGLOBIN: 1.5 % (ref 0.5–1.5)
METHEMOGLOBIN: 0.9 % (ref 0.0–1.5)
O2 Saturation: 59.1 %
Total hemoglobin: 7.9 g/dL — ABNORMAL LOW (ref 13.5–18.0)

## 2016-05-17 MED ORDER — ENOXAPARIN SODIUM 40 MG/0.4ML ~~LOC~~ SOLN
40.0000 mg | SUBCUTANEOUS | Status: DC
  Administered 2016-05-17 – 2016-05-22 (×6): 40 mg via SUBCUTANEOUS
  Filled 2016-05-17 (×6): qty 0.4

## 2016-05-17 MED ORDER — ALPRAZOLAM 0.5 MG PO TABS
1.0000 mg | ORAL_TABLET | Freq: Every day | ORAL | Status: DC
  Administered 2016-05-17: 1 mg via ORAL
  Filled 2016-05-17: qty 2

## 2016-05-17 MED ORDER — ENOXAPARIN SODIUM 40 MG/0.4ML ~~LOC~~ SOLN: 40.0000 mg | SUBCUTANEOUS | Status: DC

## 2016-05-17 MED ORDER — METOPROLOL TARTRATE 12.5 MG HALF TABLET: 12.5000 mg | ORAL_TABLET | Freq: Two times a day (BID) | ORAL | Status: DC

## 2016-05-17 MED ORDER — METOPROLOL TARTRATE 12.5 MG HALF TABLET
12.5000 mg | ORAL_TABLET | Freq: Two times a day (BID) | ORAL | Status: DC
  Administered 2016-05-17 – 2016-05-23 (×13): 12.5 mg via ORAL
  Filled 2016-05-17 (×14): qty 1

## 2016-05-17 MED ORDER — METOPROLOL TARTRATE 25 MG/10 ML ORAL SUSPENSION: 12.5000 mg | Freq: Two times a day (BID) | ORAL | Status: DC

## 2016-05-17 MED ORDER — FUROSEMIDE 10 MG/ML IJ SOLN
40.0000 mg | Freq: Two times a day (BID) | INTRAMUSCULAR | Status: DC
  Administered 2016-05-18: 40 mg via INTRAVENOUS
  Filled 2016-05-17: qty 4

## 2016-05-17 MED ORDER — FOLIC ACID 1 MG PO TABS
1.0000 mg | ORAL_TABLET | Freq: Every day | ORAL | Status: DC
  Administered 2016-05-17 – 2016-05-23 (×7): 1 mg via ORAL
  Filled 2016-05-17 (×7): qty 1

## 2016-05-17 MED ORDER — POTASSIUM CHLORIDE CRYS ER 20 MEQ PO TBCR
20.0000 meq | EXTENDED_RELEASE_TABLET | Freq: Once | ORAL | Status: AC
  Administered 2016-05-17: 20 meq via ORAL
  Filled 2016-05-17: qty 1

## 2016-05-17 MED ORDER — FUROSEMIDE 10 MG/ML IJ SOLN
20.0000 mg | Freq: Two times a day (BID) | INTRAMUSCULAR | Status: DC
  Administered 2016-05-17: 20 mg via INTRAVENOUS
  Filled 2016-05-17: qty 2

## 2016-05-17 NOTE — Progress Notes (Signed)
Pt refuse NIV for the night per RN

## 2016-05-17 NOTE — Progress Notes (Signed)
Patient ID: Patrick Fritz, male   DOB: Feb 11, 1951, 65 y.o.   MRN: [id number] EVENING ROUNDS NOTE :     [address]       [place] [address]             [phone]                 3 Days Post-Op Procedure(s) (LRB): CORONARY ARTERY BYPASS GRAFTING (CABG) times two with LIMA to LAD and right leg SVG to PDA,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 (N/A)  Total Length of Stay:  LOS: 2 days  BP 113/73   Pulse 90   Temp 98.3 F (36.8 C) (Oral)   Resp (!) 28   Ht 5\' 10"  (1.778 m)   Wt 263 lb 3.7 oz (119.4 kg)   SpO2 96%   BMI 37.77 kg/m   .Intake/Output      08/02 0701 - 08/03 0700   P.O.    I.V. (mL/kg) 192.1 (1.6)   IV Piggyback    Total Intake(mL/kg) 192.1 (1.6)   Urine (mL/kg/hr) 775 (0.5)   Chest Tube 100 (0.1)   Total Output 875   Net -682.9         . sodium chloride Stopped (05/15/16 1700)  . sodium chloride    . sodium chloride Stopped (05/15/16 1700)  . lactated ringers 20 mL/hr at 05/17/16 0800  . lactated ringers 20 mL/hr at 05/17/16 0800  . milrinone 0.125 mcg/kg/min (05/17/16 1151)  . norepinephrine (LEVOPHED) Adult infusion Stopped (05/16/16 0601)     Lab Results  Component Value Date   WBC 12.1 (H) 05/17/2016   HGB 8.0 (L) 05/17/2016   HCT 24.2 (L) 05/17/2016   PLT 127 (L) 05/17/2016   GLUCOSE 145 (H) 05/17/2016   CHOL 179 05/14/2016   TRIG 165 (H) 05/14/2016   HDL 45 05/14/2016   LDLCALC 101 (H) 05/14/2016   ALT 45 05/17/2016   AST 90 (H) 05/17/2016   NA 133 (L) 05/17/2016   K 3.9 05/17/2016   CL 98 (L) 05/17/2016   CREATININE 1.12 05/17/2016   BUN 19 05/17/2016   CO2 28 05/17/2016   INR 1.41 05/17/2016   Renal function stable  dizziness when stands, not able to ambulate yet  Grace Isaac MD  [place] Office [phone] 05/17/2016 7:08 PM

## 2016-05-17 NOTE — Progress Notes (Signed)
Patient Name: Patrick Fritz Date of Encounter: 05/17/2016    SUBJECTIVE: Only complaint is no sleep. He has mild sternal soreness. He denies dyspnea. Sitting in a recliner watching television.  TELEMETRY:  Normal sinus rhythm Vitals:   05/17/16 0400 05/17/16 0407 05/17/16 0500 05/17/16 0600  BP: (!) 99/57  105/66 119/65  Pulse: 88  89 90  Resp: 18  19 (!) 22  Temp:  98.4 F (36.9 C)    TempSrc:  Oral    SpO2: 94%  95% 94%  Weight:   263 lb 3.7 oz (119.4 kg)   Height:        Intake/Output Summary (Last 24 hours) at 05/17/16 0743 Last data filed at 05/17/16 0700  Gross per 24 hour  Intake          1973.17 ml  Output             3780 ml  Net         -1806.83 ml   LABS: Basic Metabolic Panel:  Recent Labs  05/15/16 0650  05/16/16 0320 05/16/16 1622 05/16/16 1810 05/17/16 0410  NA 138  < > 135 135  --  133*  K 4.4  < > 4.0 4.2  --  3.9  CL 109  < > 106 95*  --  98*  CO2 24  < > 24  --   --  28  GLUCOSE 136*  < > 144* 167*  --  145*  BUN 10  < > 13 16  --  19  CREATININE 0.90  < > 1.01 0.90 1.06 1.12  CALCIUM 7.6*  < > 7.7*  --   --  8.0*  MG 2.6*  --   --   --  1.9  --   < > = values in this interval not displayed. CBC:  Recent Labs  05/14/16 1310  05/16/16 1810 05/17/16 0410  WBC 8.2  < > 13.4* 12.1*  NEUTROABS 6.1  --   --   --   HGB 13.8  < > 8.3* 8.0*  HCT 40.9  < > 24.9* 24.2*  MCV 88.0  < > 88.0 88.3  PLT 196  < > 141* 127*  < > = values in this interval not displayed. Cardiac Enzymes:  Recent Labs  05/14/16 1310  TROPONINI 0.20*   BNP: Invalid input(s): POCBNP Hemoglobin A1C: No results for input(s): HGBA1C in the last 72 hours. Fasting Lipid Panel:  Recent Labs  05/14/16 1310  CHOL 179  HDL 45  LDLCALC 101*  TRIG 165*  CHOLHDL 4.0    Radiology/Studies:  Chest x-ray from yesterday reveals no significant edema. 2/postsurgical apparatus in place.  Physical Exam: Blood pressure 119/65, pulse 90, temperature 98.4 F (36.9  C), temperature source Oral, resp. rate (!) 22, height 5\' 10"  (1.778 m), weight 263 lb 3.7 oz (119.4 kg), SpO2 94 %. Weight change: 18 lb 4.8 oz (8.3 kg)  Wt Readings from Last 3 Encounters:  05/17/16 263 lb 3.7 oz (119.4 kg)  12/17/13 244 lb (110.7 kg)   Chest is clear anterior Cardiac exam reveals that rub present on prior exam greater than 24 hours ago has resolved. No peripheral edema.  ASSESSMENT:  1. Status post emergency surgery for coronary dissection, gated by aortic dissection where the patient received double vessel bypass and aortic repair. 2. CAD status post LIMA to LAD and SVG to RCA. No ischemic symptoms. 3. History of sleep apnea. Not currently  on Cipro.  Plan:  Patient is doing very well status post excellent surgical procedure by Dr. Prescott Gum.  Monitor for postsurgical and postinfarct complications.  Signed, [name] 05/17/2016, 7:43 AM

## 2016-05-17 NOTE — Progress Notes (Addendum)
[facility] DAILY ICU PROGRESS NOTE                   [address]            [place] [address]          [phone]   3 Days Post-Op Procedure(s) (LRB): CORONARY ARTERY BYPASS GRAFTING (CABG) times two with LIMA to LAD and right leg SVG to PDA,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 (N/A)  Total Length of Stay:  LOS: 2 days   Subjective: Patient sitting in chair. Does not have much appetite.  Objective: Vital signs in last 24 hours: Temp:  [97.7 F (36.5 C)-99 F (37.2 C)] 98.2 F (36.8 C) (08/02 0806) Pulse Rate:  [81-99] 87 (08/02 0800) Cardiac Rhythm: Normal sinus rhythm (08/02 0800) Resp:  [13-27] 21 (08/02 0800) BP: (88-131)/(47-72) 96/66 (08/02 0800) SpO2:  [88 %-100 %] 88 % (08/02 0800) Weight:  [263 lb 3.7 oz (119.4 kg)] 263 lb 3.7 oz (119.4 kg) (08/02 0500)  Filed Weights   05/15/16 1715 05/16/16 0438 05/17/16 0500  Weight: 244 lb 14.9 oz (111.1 kg) 274 lb 14.6 oz (124.7 kg) 263 lb 3.7 oz (119.4 kg)    Weight change: 18 lb 4.8 oz (8.3 kg)      Intake/Output from previous day: 08/01 0701 - 08/02 0700 In: 1973.2 [P.O.:540; I.V.:1283.2; IV Piggyback:150] Out: 3780 [Urine:2910; Chest Tube:870]  Intake/Output this shift: Total I/O In: 56.3 [I.V.:56.3] Out: -   Current Meds: Scheduled Meds: . acetaminophen  1,000 mg Oral Q6H   Or  . acetaminophen (TYLENOL) oral liquid 160 mg/5 mL  1,000 mg Per Tube Q6H  . aspirin EC  325 mg Oral Daily   Or  . aspirin  324 mg Per Tube Daily  . bisacodyl  10 mg Oral Daily   Or  . bisacodyl  10 mg Rectal Daily  . docusate sodium  200 mg Oral Daily  . insulin aspart  0-24 Units Subcutaneous Q4H  . metoCLOPramide (REGLAN) injection  10 mg Intravenous Q6H  . metoprolol tartrate  12.5 mg Oral BID   Or  . metoprolol tartrate  12.5 mg Per Tube BID  . pantoprazole  40 mg Oral Daily  . simvastatin  20 mg Oral q1800  . sodium chloride flush  3 mL Intravenous Q12H   Continuous Infusions: . sodium chloride  Stopped (05/15/16 1700)  . sodium chloride    . sodium chloride Stopped (05/15/16 1700)  . DOPamine Stopped (05/15/16 0200)  . furosemide (LASIX) infusion 8 mg/hr (05/17/16 0800)  . lactated ringers 20 mL/hr at 05/17/16 0800  . lactated ringers 20 mL/hr at 05/17/16 0800  . milrinone 0.25 mcg/kg/min (05/17/16 0800)  . norepinephrine (LEVOPHED) Adult infusion Stopped (05/16/16 0601)   PRN Meds:.sodium chloride, metoprolol, midazolam, morphine injection, ondansetron (ZOFRAN) IV, oxyCODONE, sodium chloride flush, traMADol  General appearance: alert, cooperative and no distress Heart: RRR Lungs: Diminished at bases Abdomen: soft, non-tender; bowel sounds normal; no masses,  no organomegaly Extremities: Bilateral LE edema Wound: Clean and dry. No signs of infection  Lab Results: CBC: Recent Labs  05/16/16 1810 05/17/16 0410  WBC 13.4* 12.1*  HGB 8.3* 8.0*  HCT 24.9* 24.2*  PLT 141* 127*   BMET:  Recent Labs  05/16/16 0320 05/16/16 1622 05/16/16 1810 05/17/16 0410  NA 135 135  --  133*  K 4.0 4.2  --  3.9  CL 106 95*  --  98*  CO2 24  --   --  28  GLUCOSE 144* 167*  --  145*  BUN 13 16  --  19  CREATININE 1.01 0.90 1.06 1.12  CALCIUM 7.7*  --   --  8.0*    CMET: Lab Results  Component Value Date   WBC 12.1 (H) 05/17/2016   HGB 8.0 (L) 05/17/2016   HCT 24.2 (L) 05/17/2016   PLT 127 (L) 05/17/2016   GLUCOSE 145 (H) 05/17/2016   CHOL 179 05/14/2016   TRIG 165 (H) 05/14/2016   HDL 45 05/14/2016   LDLCALC 101 (H) 05/14/2016   ALT 45 05/17/2016   AST 90 (H) 05/17/2016   NA 133 (L) 05/17/2016   K 3.9 05/17/2016   CL 98 (L) 05/17/2016   CREATININE 1.12 05/17/2016   BUN 19 05/17/2016   CO2 28 05/17/2016   INR 1.41 05/17/2016    PT/INR:  Recent Labs  05/17/16 0410  LABPROT 17.4*  INR 1.41   Radiology: Dg Chest Port 1 View  Result Date: 05/17/2016 CLINICAL DATA:  Status post CABG 2 days on May 14, 2016 EXAM: PORTABLE CHEST 1 VIEW COMPARISON:  Portable chest  x-ray of May 16, 2016 FINDINGS: The lungs are mildly hypoinflated. There is persistent bibasilar atelectasis greatest on the left. A trace of pleural fluid on the left is suspected. There is no pneumothorax. The cardiac silhouette remains enlarged. The pulmonary vascularity is less prominent. The right internal jugular Cordis sheath tip projects over the proximal SVC. The bilateral chest tubes are in stable position. IMPRESSION: Slight interval improvement in the appearance of the chest with slight decrease in pulmonary interstitial edema. No pneumothorax or large pleural effusion. The chest tubes are in stable position bilaterally. Electronically Signed   By: David  Martinique M.D.   On: 05/17/2016 07:43     Assessment/Plan: S/P Procedure(s) (LRB): CORONARY ARTERY BYPASS GRAFTING (CABG) times two with LIMA to LAD and right leg SVG to PDA,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 (N/A)  1. CV- S/p STEMI. PVCs, SR in the 90's.On Milrinone drip and co ox is 59.1%. Also on Lopressor 12.5 mg bid.  2. Pulmonary- On 2 liters of oxygen via [place]. Wean to room air as tolerates. Chest tubes with 870 cc of output last 24 hours. CXR this am shows no pneumothorax, decreased in interstitial edema. Encourage incentive spirometer and flutter valve 3. Volume overload-On Lasix drip. Will discuss with Dr. Prescott Gum when to transition off drip. 4. ABL anemia- H and H stable at 8 and 24.2. Start Folic acid 5. Mild thrombocytopenia-platelets slightly decreased to 127,000. 5. Has history of DVT,PE so will will start Lovenox (as per Dr. Prescott Gum). Will eventually need to restart Xarelto. 6. Supplement potassium 7. CBGs 129/138/136. Will check HGA1C. No prior history of diabetes.   Nani Skillern PA-C 05/17/2016 9:14 AM   Patient slowly recovering after MI with simultaneous type A aortic dissection. Continue diuresis, mobilization and remove right pleural tube. Physical therapy consultation requested. Start  Lovenox or history of DVT-PE on lifelong oral anticoagulation  patient examined and medical record reviewed,agree with above note. Tharon Aquas Trigt III 05/17/2016

## 2016-05-17 NOTE — Op Note (Signed)
NAME:  Patrick Fritz, Patrick Fritz NO.:  [id number]  MEDICAL RECORD NO.:  [id number]  LOCATION:                                 FACILITY:  PHYSICIAN:  Ivin Poot, M.D.  DATE OF BIRTH:  1951-09-21  DATE OF PROCEDURE:  05/14/2016 DATE OF DISCHARGE:                              OPERATIVE REPORT   OPERATION: 1. Emergency coronary artery bypass grafting x2 (left internal mammary     artery to left anterior descending coronary artery, saphenous vein     graft to distal posterior descending). 2. Emergency repair of type A ascending aortic dissection with a 26 mm     Hemashield straight graft replacement of the ascending aorta above     the sinotubular junction to the proximal arch. 3. Placement of femoral A-line, right. 4. Right axillary artery cannulation for cardiopulmonary bypass and     antegrade cerebral perfusion during profound hypothermic     circulatory arrest.  SURGEON:  Ivin Poot, MD  ASSISTANT:  Valentina Gu, SA  PREOPERATIVE DIAGNOSES: 1. ST-segment elevation myocardial infarction with complete occlusion     of the right coronary artery, attempted percutaneous coronary     intervention of the right coronary artery with resultant dissection     of the right coronary artery from its origin to the distal     posterior descending. 2. Intimal dissection and false lumen flap of the ascending aorta     following attempted percutaneous coronary intervention of the right     coronary artery.  POSTOPERATIVE DIAGNOSES: 1. ST-segment elevation myocardial infarction with complete occlusion     of the right coronary artery, attempted percutaneous coronary     intervention of the right coronary artery with resultant dissection     of the right coronary artery from its origin to the distal     posterior descending. 2. Intimal dissection and false lumen flap of the ascending aorta     following attempted percutaneous coronary intervention of the right      coronary artery.  ANESTHESIA:  General by Soledad Gerlach, MD.  INDICATIONS:  The patient is a 65 year old obese male, who presented with several days of angina culminating and angina at rest, unrelieved and severe in intensity.  In the emergency department, he had ST-segment elevation and cardiac enzymes were already positive.  He was taken for emergency catheterization.  During the catheterization, a diagnostic arteriogram showed occlusion of the RCA, 90% stenosis of the proximal LAD and mild disease of the circumflex system.  Attempted PCI resulted in dissection of the entire RCA vessel as well as intimal flap- dissection of the ascending aorta above the coronaries starting at the right coronary ostium.  I examined the patient at catheterization, discussed the patient with his cardiologist, reviewed the coronary arteriograms, and recommended emergency surgery to correct the dissected RCA and the dissected ascending aorta.  I also discussed the procedure with the patient's wife and family and detailed the expected benefits with the surgery-survival. I also detailed the potential risks including risks of stroke, death, infection, postoperative pulmonary problems including pleural effusion, postoperative arrhythmias including complete heart block requiring pacemaker, and death.  The patient agreed to proceed.  The patient's wife in a separate conversation in the CCU waiting room also demonstrated her understanding and agreed to proceed with surgery.  OPERATIVE FINDINGS: 1. Complete dissection of the right coronary with the dissection     extending into the mid posterior descending necessitating bypass     graft to the distal posterior descending which was a small vessel. 2. Intimal flap dissection of the ascending aorta originating at the     area of the right coronary ostium extending along the right side of     the ascending aorta becoming circumferential at the proximal  arch. 3. Minimal 1 to 2+ aortic insufficiency at the beginning of the     operation. 4. Successful coronary revascularization with left IMA to LAD, and     saphenous vein graft to the distal posterior descending, successful     replacement of the dissection of the ascending aorta with a 26 mm     Hemashield graft using hypothermic circulatory arrest.  OPERATIVE PROCEDURE:  The patient was brought to the operating room from the cath lab and placed supine on the operating room table.  General anesthesia was induced under invasive monitoring invasive monitoring. Since the patient had bilateral radial artery sheaths for the catheterization procedure after the patient was prepped and draped, a femoral A-line was placed for blood pressure monitoring.  A proper time- out was performed.  The echo showed inferior hypokinesia with mild- moderate AI and no significant MR.  After the patient had been prepped and draped and the femoral A-line in place, an incision was made beneath the right clavicle for axillary artery exposure and cannulation.  The pectoralis major was divided. Pectoralis minor was divided.  The deep retractor was used.  The axillary artery was identified and encircled with vessel loops after the brachial plexus had been gently retracted to the side.  Heparin was administered.  Vascular clamps were placed proximally and distally and arteriotomy was performed.  A 10 mm Gelweave graft was sewn end-to-side using running 5-0 Prolene to the axillary artery.  The clamps were removed and there was good hemostasis.  This was for the later aortic cannulation.  An 8 mm cannula was then placed into the 10 mm graft and secured with several heavy silk ties for arterial inflow.  The sternal incision had been made.  The pericardium was opened.  There was pericardial effusion.  The ascending aorta was discolored.  A pursestring was placed in the right atrium.  Heparin was administered. A venous  cannula was placed in the right atrium, using the arterial inflow through the axillary artery and venous drainage from the right atrium was then placed on cardiopulmonary bypass.  A vent was placed via the right superior pulmonary vein.  Cannulas for both antegrade aortic and retrograde coronary sinus cardioplegia were placed.  The patient was cooled to 32 degrees taking the patient down later to 22 degrees.  The aortic crossclamp was carefully applied.  Cardioplegia was delivered. Cardioplegia through the aortic root was not very effective, so approximately 800 mL of retrograde cold blood cardioplegia was delivered with good cardioplegic arrest, and septal temperature dropped less than 12 degrees.  Cardioplegia was delivered every 20 minutes while the crossclamp was in place, while the patient was in circulatory arrest.  The innominate artery was dissected and encircled with a vessel loop.  Saphenous vein had been harvested endoscopically from the right leg. The right leg vein was prepared as was  the mammary artery harvested from the left chest wall.  The coronary anastomoses were performed 1st.  The right coronary was opened carefully.  It was dissected in the proximal posterior descending.  The vessel was not of adequate quality for the anastomosis and was reclosed using 8-0 Prolene to join the intimal layer to the adventitia.  A 2nd more distal arteriotomy and posterior descending was made and the vessel was adequate for the anastomosis to that point.  A reverse saphenous vein was sewn end-to-side with running 7-0 Prolene.  There was good flow through the graft.  Cardioplegia was redosed.  The 2nd distal anastomosis was to the distal LAD.  Had a proximal 90% stenosis.  The left IMA pedicle was brought through an opening in the left lateral pericardium, was brought down onto the LAD and sewn end-to- side with running 8-0 Prolene.  There was good flow through the anastomosis after  briefly releasing the pedicle bulldog on the mammary artery.  The bulldog was reapplied and the pedicle was secured to the epicardium.  Cardioplegia was redosed.  Attention was then directed to the aorta.  The aorta was transected beneath the crossclamp.  There were 2 layers and it was completely dissected with the intimal flap.  The aorta was transected at the sinotubular junction above the commissures.  The aortic valve was intact.  The left main coronary ostia was normal.  The right coronary ostium was inspected and there was an intimal tear just to the left of the right coronary ostium.  This was closed with a 5-0 Prolene with a small pledget.  The right coronary ostia was patent.  The sinotubular junction diameter was measured to be 27 mm, so a 26 mm graft was used.  The intimal layer was fused back to the adventitia using Teflon felt and bio glue.  After eliminating the false lumen, the graft was sewn and then with a running 4-0 Prolene.  After the inferior portion of the anastomosis was completed, several interrupted 4-0 pledgeted Prolene sutures were placed to reinforce the closure.  The running anastomosis was then completed anteriorly and the suture was tied.  Several more interrupted 4-0 pledgeted Prolene sutures were placed on the outside of the anastomosis anteriorly for reinforcement of the suture line.  The suture line was then covered with a fine layer of CoSeal medical adhesive.  Cardioplegia was redosed.  By this time, the patient's core temperature had been cooled to 20 degrees.  The patient was prepared for circulatory arrest by Anesthesia and the head was packed in ice.  The patient was placed in steep Trendelenburg and blood volume was drained to the cardiotomy reservoir. The crossclamp was removed.  The innominate artery was clamped and inflow through the axillary artery was maintained for cerebral perfusion.  Cerebral perfusion was monitored by the cerebral  pulse oximeter.  The aorta was transected beneath the innominate artery.  The false lumen was then eliminated by using a bio glue on the right side of the aorta between the intima and the adventitia.  This eliminated the false lumen. The anastomosis was then started after a 2nd intimal tear was identified at the origin of the innominate artery.  The aorta was tailored to eliminate most of the intimal tear and the remaining intimal tear was primarily closed with interrupted 5-0 pledgeted suture.  Next, the 26 mm Hemashield graft was cut to the appropriate length and bevel and then sewn end-to-end to the proximal arch.  Running 4-0 Prolene  was used for the inferior aspect of the anastomosis and this was then reinforced with several interrupted 4-0 pledgeted Prolenes on the inside of the anastomosis.  The running suture was then completed anteriorly and then this was again reinforced with several interrupted 4- 0 pledgeted Prolene sutures.  Cardioplegia had been delivered every 20 minutes.  A small opening in the graft was made for the proximal vein anastomoses using the handheld cautery.  The vein graft was then sewn end-to-side with running 6-0 Prolene.  A 2nd small opening in the graft was made for a vent.  The patient was then reperfused after removing the clamp on the innominate artery and thus the cerebral perfusion was then converted to whole body perfusion.  It took approximately 2 hours to rewarm the patient.  After the cross- clamp had been removed and perfusion through the entire body and the aortic root was started, there was minimal bleeding from the suture line.  The cardioplegia lines were removed.  Temporary pacing wires were applied.  When the patient had been rewarmed back to 37 degrees, the usual de- airing maneuvers were performed and the lungs were expanded.  Ventilator was resumed.  The patient was then weaned from cardiopulmonary bypass. The LV vent had been  removed.  Echo showed good LV function.  The aortic valve was competent.  Cardiac output was normal.  The patient had moderate coagulopathy, low platelets.  The patient was given FFP, cryo, and platelets.  This improved coagulation function.  Bilateral pleural tubes, anterior mediastinal and posterior mediastinal chest tubes were placed.  The superior pericardium was closed. Protamine had been administered to reverse the heparin and the cannulas were removed.  The graft on the axillary artery was stapled and divided.  This incision was closed in layers using a Vicryl.  The sternum was closed with interrupted steel wire.  The pectoralis fascia was closed with running #1 Vicryl.  The subcutaneous and skin layers were closed in running Vicryl.  Total cardiopulmonary bypass time was 280 minutes.  There was a circulatory arrest and antegrade cerebral perfusion time was 55 minutes.     Ivin Poot, M.D.   ______________________________ Ivin Poot, M.D.    PV/MEDQ  D:  05/16/2016  T:  05/16/2016  Job:  [id number]  cc:   Belva Crome, M.D.

## 2016-05-18 ENCOUNTER — Inpatient Hospital Stay (HOSPITAL_COMMUNITY): Payer: Medicare Other

## 2016-05-18 LAB — TYPE AND SCREEN
ABO/RH(D): A POS
ANTIBODY SCREEN: NEGATIVE
UNIT DIVISION: 0
Unit division: 0
Unit division: 0
Unit division: 0
Unit division: 0
Unit division: 0

## 2016-05-18 LAB — COMPREHENSIVE METABOLIC PANEL
ALT: 47 U/L (ref 17–63)
AST: 68 U/L — ABNORMAL HIGH (ref 15–41)
Albumin: 2.6 g/dL — ABNORMAL LOW (ref 3.5–5.0)
Alkaline Phosphatase: 56 U/L (ref 38–126)
Anion gap: 4 — ABNORMAL LOW (ref 5–15)
BUN: 24 mg/dL — ABNORMAL HIGH (ref 6–20)
CO2: 30 mmol/L (ref 22–32)
Calcium: 8 mg/dL — ABNORMAL LOW (ref 8.9–10.3)
Chloride: 99 mmol/L — ABNORMAL LOW (ref 101–111)
Creatinine, Ser: 1.04 mg/dL (ref 0.61–1.24)
GFR calc Af Amer: >60 mL/min (ref >60)
GFR calc non Af Amer: >60 mL/min (ref >60)
Glucose, Bld: 100 mg/dL — ABNORMAL HIGH (ref 65–99)
Potassium: 3.6 mmol/L (ref 3.5–5.1)
Sodium: 133 mmol/L — ABNORMAL LOW (ref 135–145)
Total Bilirubin: 0.7 mg/dL (ref 0.3–1.2)
Total Protein: 4.8 g/dL — ABNORMAL LOW (ref 6.5–8.1)

## 2016-05-18 LAB — GLUCOSE, CAPILLARY
Glucose-Capillary: 101 mg/dL — ABNORMAL HIGH (ref 65–99)
Glucose-Capillary: 107 mg/dL — ABNORMAL HIGH (ref 65–99)
Glucose-Capillary: 115 mg/dL — ABNORMAL HIGH (ref 65–99)
Glucose-Capillary: 130 mg/dL — ABNORMAL HIGH (ref 65–99)
Glucose-Capillary: 92 mg/dL (ref 65–99)
Glucose-Capillary: 96 mg/dL (ref 65–99)

## 2016-05-18 LAB — CARBOXYHEMOGLOBIN
CARBOXYHEMOGLOBIN: 1.6 % — AB (ref 0.5–1.5)
Carboxyhemoglobin: 1.5 % (ref 0.5–1.5)
Methemoglobin: 0.7 % (ref 0.0–1.5)
Methemoglobin: 0.9 % (ref 0.0–1.5)
O2 Saturation: 59 %
O2 Saturation: 46.1 %
Total hemoglobin: 8.9 g/dL — ABNORMAL LOW (ref 13.5–18.0)
Total hemoglobin: 10.7 g/dL — ABNORMAL LOW (ref 13.5–18.0)

## 2016-05-18 LAB — CBC
HCT: 23.9 % — ABNORMAL LOW (ref 39.0–52.0)
Hemoglobin: 7.9 g/dL — ABNORMAL LOW (ref 13.0–17.0)
MCH: 28.9 pg (ref 26.0–34.0)
MCHC: 33.1 g/dL (ref 30.0–36.0)
MCV: 87.5 fL (ref 78.0–100.0)
Platelets: 160 10*3/uL (ref 150–400)
RBC: 2.73 MIL/uL — ABNORMAL LOW (ref 4.22–5.81)
RDW: 13.3 % (ref 11.5–15.5)
WBC: 12.6 10*3/uL — ABNORMAL HIGH (ref 4.0–10.5)

## 2016-05-18 LAB — POCT I-STAT, CHEM 8
BUN: 25 mg/dL — ABNORMAL HIGH (ref 6–20)
Calcium, Ion: 1.14 mmol/L (ref 1.12–1.23)
Chloride: 94 mmol/L — ABNORMAL LOW (ref 101–111)
Creatinine, Ser: 1 mg/dL (ref 0.61–1.24)
Glucose, Bld: 132 mg/dL — ABNORMAL HIGH (ref 65–99)
HCT: 28 % — ABNORMAL LOW (ref 39.0–52.0)
Hemoglobin: 9.5 g/dL — ABNORMAL LOW (ref 13.0–17.0)
Potassium: 3.7 mmol/L (ref 3.5–5.1)
Sodium: 136 mmol/L (ref 135–145)
TCO2: 27 mmol/L (ref 0–100)

## 2016-05-18 LAB — PREPARE RBC (CROSSMATCH)

## 2016-05-18 MED ORDER — POTASSIUM CHLORIDE CRYS ER 10 MEQ PO TBCR
EXTENDED_RELEASE_TABLET | ORAL | Status: AC
  Filled 2016-05-18: qty 2

## 2016-05-18 MED ORDER — POTASSIUM CHLORIDE 10 MEQ/50ML IV SOLN
10.0000 meq | INTRAVENOUS | Status: AC | PRN
  Administered 2016-05-18 (×3): 10 meq via INTRAVENOUS
  Filled 2016-05-18 (×4): qty 50

## 2016-05-18 MED ORDER — DIPHENHYDRAMINE HCL 12.5 MG/5ML PO LIQD
12.5000 mg | Freq: Every evening | ORAL | Status: DC | PRN
  Administered 2016-05-19: 12.5 mg via ORAL
  Filled 2016-05-18 (×2): qty 5

## 2016-05-18 MED ORDER — POTASSIUM CHLORIDE 10 MEQ/50ML IV SOLN
10.0000 meq | INTRAVENOUS | Status: AC | PRN
  Administered 2016-05-18 – 2016-05-19 (×3): 10 meq via INTRAVENOUS
  Filled 2016-05-18 (×3): qty 50

## 2016-05-18 MED ORDER — CLONAZEPAM 1 MG PO TABS: 1.0000 mg | ORAL_TABLET | Freq: Every day | ORAL | Status: DC

## 2016-05-18 MED ORDER — MILRINONE LACTATE IN DEXTROSE 20-5 MG/100ML-% IV SOLN
0.1250 ug/kg/min | INTRAVENOUS | Status: DC
  Administered 2016-05-18 – 2016-05-20 (×4): 0.25 ug/kg/min via INTRAVENOUS
  Administered 2016-05-20 (×2): 0.125 ug/kg/min via INTRAVENOUS
  Filled 2016-05-18 (×5): qty 100

## 2016-05-18 MED ORDER — INSULIN ASPART 100 UNIT/ML ~~LOC~~ SOLN: 0.0000 [IU] | Freq: Three times a day (TID) | SUBCUTANEOUS | Status: DC

## 2016-05-18 MED ORDER — POTASSIUM CHLORIDE CRYS ER 20 MEQ PO TBCR
20.0000 meq | EXTENDED_RELEASE_TABLET | Freq: Once | ORAL | Status: AC
  Administered 2016-05-18: 20 meq via ORAL
  Filled 2016-05-18: qty 1

## 2016-05-18 MED ORDER — FUROSEMIDE 10 MG/ML IJ SOLN
40.0000 mg | Freq: Every day | INTRAMUSCULAR | Status: DC
  Administered 2016-05-19: 40 mg via INTRAVENOUS
  Filled 2016-05-18 (×2): qty 4

## 2016-05-18 NOTE — Progress Notes (Signed)
4 Days Post-Op Procedure(s) (LRB): CORONARY ARTERY BYPASS GRAFTING (CABG) times two with LIMA to LAD and right leg SVG to PDA,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 (N/A) Subjective:  Status post emergency CABG 2 and repair of Stanford type A ascending aortic dissection with a 26 mm Hemashield straight graft using hypothermic circulatory arrest  Patient presented with acute D MI, occlusion of RCA. 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 Attempt at weaning milrinone has been delayed because of drop in mixed venous saturation with weaning  Patient has postoperative expected blood loss anemia, hemoglobin 7.8 and will be transfuse one unit of blood. Patient's anemia is symptomatic and he becomes orthostatic and dizzy when he attempts to walk with physical therapy  Neurologic and renal function postoperative been satisfactory. Objective: Vital signs in last 24 hours: Temp:  [97.7 F (36.5 C)-99 F (37.2 C)] 98.8 F (37.1 C) (08/03 1301) Pulse Rate:  [80-91] 82 (08/03 0700) Cardiac Rhythm: Normal sinus rhythm (08/03 0700) Resp:  [11-29] 22 (08/03 0700) BP: (97-132)/(63-99) 126/84 (08/03 0600) SpO2:  [94 %-100 %] 94 % (08/03 0700) Weight:  [260 lb 12.9 oz (118.3 kg)] 260 lb 12.9 oz (118.3 kg) (08/03 0500)  Hemodynamic parameters for last 24 hours:   normal sinus rhythm  Intake/Output from previous day: 08/02 0701 - 08/03 0700 In: 532.5 [I.V.:482.5; IV Piggyback:50] Out: 2015 [Urine:1675; Chest Tube:340] Intake/Output this shift: Total I/O In: 100 [IV Piggyback:100] Out: 2010 [Urine:1900;  Chest Tube:110]       Exam    General- alert and comfortable   Lungs- clear without rales, wheezes   Cor- regular rate and rhythm, no murmur , gallop   Abdomen- soft, non-tender   Extremities - warm, non-tender, minimal edema   Neuro- oriented, appropriate, no focal weakness   Lab Results:  Recent Labs  05/17/16 0410 05/17/16 1947 05/18/16 0355  WBC 12.1*  --  12.6*  HGB 8.0* 8.2* 7.9*  HCT 24.2* 24.0* 23.9*  PLT 127*  --  160   BMET:  Recent Labs  05/17/16 0410 05/17/16 1947 05/18/16 0355  NA 133* 134* 133*  K 3.9 3.8 3.6  CL 98* 93* 99*  CO2 28  --  30  GLUCOSE 145* 136* 100*  BUN 19 25* 24*  CREATININE 1.12 1.10 1.04  CALCIUM 8.0*  --  8.0*    PT/INR:  Recent Labs  05/17/16 0410  LABPROT 17.4*  INR 1.41   ABG    Component Value Date/Time   PHART 7.390 05/15/2016 1548   HCO3 21.1 05/15/2016 1548   TCO2 27 05/17/2016 1947   ACIDBASEDEF 4.0 (H) 05/15/2016 1548   O2SAT 46.1 05/18/2016 1410   CBG (last 3)   Recent Labs  05/18/16 0349 05/18/16 0739 05/18/16 1300  GLUCAP 96 101* 107*    Assessment/Plan: S/P Procedure(s) (LRB): CORONARY ARTERY BYPASS GRAFTING (CABG) times two with LIMA to LAD and right leg SVG to PDA,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 (N/A) Continue diuretics, mobilization   continue postop milrinone for preoperative MI in place PICC line  transfuse one unit of packed cells for hemoglobin 7.8 Continue Lovenox  for DVT prophylaxis-patient will not be treated with Xarelto postop. We'll plan on starting Coumadin once chest tubes have been removed. Check daily INR.   LOS: 3 days    Tharon Aquas Trigt III 05/18/2016

## 2016-05-18 NOTE — Care Management Important Message (Signed)
Important Message  Patient Details  Name: Patrick Fritz MRN: [id number] Date of Birth: Mar 28, 1951   Medicare Important Message Given:  Yes    Nathen May 05/18/2016, 10:11 AM

## 2016-05-18 NOTE — Evaluation (Signed)
Physical Therapy Evaluation Patient Details Name: Patrick Fritz MRN: [id number] DOB: [birth year] Today's Date: 05/18/2016   History of Present Illness  Pt adm with MI and underwent CABG and repair of aortic aneurysm on 05/14/16. PMH - DVT, PE, HTN, arthritis  Clinical Impression  Pt admitted with above diagnosis and presents to PT with functional limitations due to deficits listed below (See PT problem list). Pt needs skilled PT to maximize independence and safety to allow discharge to home with assist of wife. Hopefully will become more awake soon.     Follow Up Recommendations Home health PT;Supervision/Assistance - 24 hour    Equipment Recommendations  Other (comment) (rollator)    Recommendations for Other Services       Precautions / Restrictions Precautions Precautions: Fall;Sternal      Mobility  Bed Mobility Overal bed mobility: Needs Assistance Bed Mobility: Sidelying to Sit;Sit to Sidelying   Sidelying to sit: +2 for physical assistance;Mod assist     Sit to sidelying: +2 for physical assistance;Mod assist General bed mobility comments: Assist to bring legs over and elevate trunk into sitting. Assist to lower trunk and bring legs back into bed.  Transfers Overall transfer level: Needs assistance Equipment used: 4-wheeled walker Transfers: Sit to/from Stand Sit to Stand: +2 physical assistance;Mod assist         General transfer comment: Assist to bring hips up and for balance.   Ambulation/Gait Ambulation/Gait assistance: +2 physical assistance;Min assist;Mod assist Ambulation Distance (Feet): 40 Feet (40' x 1, 25' x 1) Assistive device: 4-wheeled walker Gait Pattern/deviations: Step-through pattern;Decreased step length - right;Decreased step length - left;Shuffle Gait velocity: decr Gait velocity interpretation: Below normal speed for age/gender General Gait Details: Assist for balance and support. On initial gait pt with slower responses and blank look  and began leaning heavily forward. Brought recliner to pt. On 2nd amb pt more alert and with improved gait.  Stairs            Wheelchair Mobility    Modified Rankin (Stroke Patients Only)       Balance Overall balance assessment: Needs assistance Sitting-balance support: Bilateral upper extremity supported Sitting balance-Leahy Scale: Poor Sitting balance - Comments: UE support   Standing balance support: Bilateral upper extremity supported Standing balance-Leahy Scale: Poor Standing balance comment: min to mod A for static standing                             Pertinent Vitals/Pain Pain Assessment: No/denies pain    Home Living Family/patient expects to be discharged to:: Private residence Living Arrangements: Spouse/significant other Available Help at Discharge: Family Type of Home: House Home Access: Stairs to enter     Home Layout: One level Home Equipment: None      Prior Function Level of Independence: Independent         Comments: Works. Owns a fitness center.     Hand Dominance        Extremity/Trunk Assessment   Upper Extremity Assessment: Generalized weakness           Lower Extremity Assessment: Generalized weakness         Communication   Communication: No difficulties  Cognition Arousal/Alertness: Lethargic;Suspect due to medications Behavior During Therapy: Flat affect Overall Cognitive Status: Impaired/Different from baseline Area of Impairment: Problem solving             Problem Solving: Slow processing (suspect fatigue or medication)  General Comments      Exercises        Assessment/Plan    PT Assessment Patient needs continued PT services  PT Diagnosis Difficulty walking;Generalized weakness   PT Problem List Decreased strength;Decreased activity tolerance;Decreased balance;Decreased mobility;Decreased knowledge of precautions  PT Treatment Interventions DME instruction;Gait  training;Functional mobility training;Therapeutic activities;Therapeutic exercise;Balance training;Patient/family education   PT Goals (Current goals can be found in the Care Plan section) Acute Rehab PT Goals Patient Stated Goal: return home PT Goal Formulation: With patient Time For Goal Achievement: 06/01/16 Potential to Achieve Goals: Good    Frequency Min 3X/week   Barriers to discharge        Co-evaluation               End of Session   Activity Tolerance: Patient limited by fatigue Patient left: in bed;with call bell/phone within reach;with nursing/sitter in room Nurse Communication: Mobility status         Time: [id number] PT Time Calculation (min) (ACUTE ONLY): 24 min   Charges:   PT Evaluation $PT Eval Moderate Complexity: 1 Procedure PT Treatments $Gait Training: 8-22 mins   PT G Codes:        Saidi Santacroce 2016/05/22, 2:24 PM [facility] PT [phone]

## 2016-05-18 NOTE — Progress Notes (Signed)
Patient ID: Patrick Fritz, male   DOB: [birth year], 65 y.o.   MRN: [id number]   SICU Evening Rounds:  Hemodynamically stable in sinus rhythm 88  Co-ox dropped from 59 to 46 when Milrinone 0.125 stopped this am. The Milrinone was restarted this afternoon at 0.25.  Excellent diuresis today.  Ambulated 50 ft

## 2016-05-19 ENCOUNTER — Inpatient Hospital Stay (HOSPITAL_COMMUNITY): Payer: Medicare Other

## 2016-05-19 DIAGNOSIS — I319 Disease of pericardium, unspecified: Secondary | ICD-10-CM

## 2016-05-19 LAB — HEMOGLOBIN A1C
Hgb A1c MFr Bld: 5.8 % — ABNORMAL HIGH (ref 4.8–5.6)
Mean Plasma Glucose: 120 mg/dL

## 2016-05-19 LAB — TYPE AND SCREEN
ABO/RH(D): A POS
Antibody Screen: NEGATIVE
Unit division: 0

## 2016-05-19 LAB — CARBOXYHEMOGLOBIN
CARBOXYHEMOGLOBIN: 1.3 % (ref 0.5–1.5)
Methemoglobin: 1.1 % (ref 0.0–1.5)
O2 Saturation: 59.5 %
Total hemoglobin: 10 g/dL — ABNORMAL LOW (ref 13.5–18.0)

## 2016-05-19 LAB — CBC
HCT: 27 % — ABNORMAL LOW (ref 39.0–52.0)
Hemoglobin: 8.9 g/dL — ABNORMAL LOW (ref 13.0–17.0)
MCH: 29.4 pg (ref 26.0–34.0)
MCHC: 33 g/dL (ref 30.0–36.0)
MCV: 89.1 fL (ref 78.0–100.0)
Platelets: 177 10*3/uL (ref 150–400)
RBC: 3.03 MIL/uL — ABNORMAL LOW (ref 4.22–5.81)
RDW: 13.7 % (ref 11.5–15.5)
WBC: 10.4 10*3/uL (ref 4.0–10.5)

## 2016-05-19 LAB — COMPREHENSIVE METABOLIC PANEL
ALT: 45 U/L (ref 17–63)
AST: 51 U/L — ABNORMAL HIGH (ref 15–41)
Albumin: 2.4 g/dL — ABNORMAL LOW (ref 3.5–5.0)
Alkaline Phosphatase: 74 U/L (ref 38–126)
Anion gap: 7 (ref 5–15)
BUN: 20 mg/dL (ref 6–20)
CO2: 29 mmol/L (ref 22–32)
Calcium: 8.1 mg/dL — ABNORMAL LOW (ref 8.9–10.3)
Chloride: 100 mmol/L — ABNORMAL LOW (ref 101–111)
Creatinine, Ser: 0.94 mg/dL (ref 0.61–1.24)
GFR calc Af Amer: >60 mL/min (ref >60)
GFR calc non Af Amer: >60 mL/min (ref >60)
Glucose, Bld: 108 mg/dL — ABNORMAL HIGH (ref 65–99)
Potassium: 4 mmol/L (ref 3.5–5.1)
Sodium: 136 mmol/L (ref 135–145)
Total Bilirubin: 0.9 mg/dL (ref 0.3–1.2)
Total Protein: 4.9 g/dL — ABNORMAL LOW (ref 6.5–8.1)

## 2016-05-19 LAB — GLUCOSE, CAPILLARY
Glucose-Capillary: 88 mg/dL (ref 65–99)
Glucose-Capillary: 111 mg/dL — ABNORMAL HIGH (ref 65–99)

## 2016-05-19 LAB — ECHOCARDIOGRAM LIMITED
Height: 70 in
Weight: 4208 oz

## 2016-05-19 LAB — PROTIME-INR
INR: 1.24
Prothrombin Time: 15.7 seconds — ABNORMAL HIGH (ref 11.4–15.2)

## 2016-05-19 MED ORDER — SODIUM CHLORIDE 0.9% FLUSH: 10.0000 mL | INTRAVENOUS | Status: DC | PRN

## 2016-05-19 NOTE — Progress Notes (Signed)
Echocardiogram 2D Echocardiogram limited has been performed.  Patrick Fritz 05/19/2016, 1:55 PM

## 2016-05-19 NOTE — Progress Notes (Signed)
[facility] BRIEF SICU PROGRESS NOTE  5 Days Post-Op  S/P Procedure(s) (LRB): CORONARY ARTERY BYPASS GRAFTING (CABG) times two with LIMA to LAD and right leg SVG to PDA,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 (N/A)   Stable day NSR w/ stable BP Breathing comfortably w/ O2 sats 97% on RA UOP excellent  Plan: Continue current plan  Rexene Alberts, MD 05/19/2016 4:24 PM

## 2016-05-19 NOTE — Progress Notes (Addendum)
T[facility] DAILY ICU PROGRESS NOTE                   [address]            [place] [address]          [phone]   5 Days Post-Op Procedure(s) (LRB): CORONARY ARTERY BYPASS GRAFTING (CABG) times two with LIMA to LAD and right leg SVG to PDA,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 (N/A)  Total Length of Stay:  LOS: 4 days   Subjective: Patient sitting in chair. States his appetite has improved. No specific complaints at this time.  Objective: Vital signs in last 24 hours: Temp:  [97.8 F (36.6 C)-98.8 F (37.1 C)] 98.3 F (36.8 C) (08/04 0700) Pulse Rate:  [27-87] 76 (08/04 0700) Cardiac Rhythm: Normal sinus rhythm (08/04 0700) Resp:  [16-29] 17 (08/04 0700) BP: (94-144)/(63-91) 108/76 (08/04 0700) SpO2:  [68 %-100 %] 95 % (08/04 0700) Weight:  [263 lb (119.3 kg)] 263 lb (119.3 kg) (08/04 0500)  Filed Weights   05/17/16 0500 05/18/16 0500 05/19/16 0500  Weight: 263 lb 3.7 oz (119.4 kg) 260 lb 12.9 oz (118.3 kg) 263 lb (119.3 kg)    Weight change: 2 lb 3.1 oz (0.996 kg)      Intake/Output from previous day: 08/03 0701 - 08/04 0700 In: 2010.8 [P.O.:840; I.V.:560.8; Blood:360; IV Piggyback:250] Out: [id number] [Urine:3410; Chest Tube:310]  Intake/Output this shift: Total I/O In: -  Out: 115 [Urine:115]  Current Meds: Scheduled Meds: . acetaminophen  1,000 mg Oral Q6H   Or  . acetaminophen (TYLENOL) oral liquid 160 mg/5 mL  1,000 mg Per Tube Q6H  . aspirin EC  325 mg Oral Daily   Or  . aspirin  324 mg Per Tube Daily  . bisacodyl  10 mg Oral Daily   Or  . bisacodyl  10 mg Rectal Daily  . docusate sodium  200 mg Oral Daily  . enoxaparin (LOVENOX) injection  40 mg Subcutaneous Q24H  . folic acid  1 mg Oral Daily  . furosemide  40 mg Intravenous Daily  . insulin aspart  0-24 Units Subcutaneous TID WC  . metoCLOPramide (REGLAN) injection  10 mg Intravenous Q6H  . metoprolol tartrate  12.5 mg Oral BID   Or  . metoprolol tartrate  12.5 mg Per  Tube BID  . pantoprazole  40 mg Oral Daily  . simvastatin  20 mg Oral q1800  . sodium chloride flush  3 mL Intravenous Q12H   Continuous Infusions: . sodium chloride Stopped (05/15/16 1700)  . sodium chloride    . sodium chloride Stopped (05/15/16 1700)  . lactated ringers 20 mL/hr at 05/17/16 0800  . lactated ringers 20 mL/hr at 05/17/16 0800  . milrinone 0.25 mcg/kg/min (05/19/16 0700)  . norepinephrine (LEVOPHED) Adult infusion Stopped (05/16/16 0601)   PRN Meds:.sodium chloride, diphenhydrAMINE, metoprolol, morphine injection, ondansetron (ZOFRAN) IV, oxyCODONE, sodium chloride flush, traMADol  General appearance: alert, cooperative and no distress Heart: RRR Lungs: Siminished at bases Abdomen: soft, non-tender; bowel sounds normal; no masses,  no organomegaly Extremities: Bilateral LE edema Wounds: Clean and dry. No signs of infection  Lab Results: CBC:  Recent Labs  05/18/16 0355 05/18/16 2105 05/19/16 0400  WBC 12.6*  --  10.4  HGB 7.9* 9.5* 8.9*  HCT 23.9* 28.0* 27.0*  PLT 160  --  177   BMET:   Recent Labs  05/18/16 0355 05/18/16 2105 05/19/16 0400  NA 133* 136 136  K 3.6 3.7 4.0  CL 99* 94* 100*  CO2 30  --  29  GLUCOSE 100* 132* 108*  BUN 24* 25* 20  CREATININE 1.04 1.00 0.94  CALCIUM 8.0*  --  8.1*    CMET: Lab Results  Component Value Date   WBC 10.4 05/19/2016   HGB 8.9 (L) 05/19/2016   HCT 27.0 (L) 05/19/2016   PLT 177 05/19/2016   GLUCOSE 108 (H) 05/19/2016   CHOL 179 05/14/2016   TRIG 165 (H) 05/14/2016   HDL 45 05/14/2016   LDLCALC 101 (H) 05/14/2016   ALT 45 05/19/2016   AST 51 (H) 05/19/2016   NA 136 05/19/2016   K 4.0 05/19/2016   CL 100 (L) 05/19/2016   CREATININE 0.94 05/19/2016   BUN 20 05/19/2016   CO2 29 05/19/2016   INR 1.24 05/19/2016   HGBA1C 5.8 (H) 05/18/2016    PT/INR:   Recent Labs  05/19/16 0400  LABPROT 15.7*  INR 1.24   Radiology: Dg Chest Port 1 View  Result Date: 05/19/2016 CLINICAL DATA:   Status post CABG on May 14, 2016 EXAM: PORTABLE CHEST 1 VIEW COMPARISON:  Portable chest x-ray of May 18, 2016. FINDINGS: The lungs are well-expanded. There is no pulmonary edema or pneumonia. The cardiac silhouette remains enlarged. The pulmonary vascularity is not engorged. There is no pneumothorax or pleural effusion. The left-sided chest tube is in stable position. The right internal jugular Cordis sheath tip projects over the proximal SVC. The sternal wires are intact. There is calcification in the wall of the aortic arch. There are surgical skin staples overlying the high right axillary region. IMPRESSION: Stable cardiomegaly without pulmonary edema. No pneumonia, pleural effusion, or pneumothorax. Electronically Signed   By: David  Martinique M.D.   On: 05/19/2016 07:34     Assessment/Plan: S/P Procedure(s) (LRB): CORONARY ARTERY BYPASS GRAFTING (CABG) times two with LIMA to LAD and right leg SVG to PDA,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 (N/A)  1. CV- S/p STEMI. SR in the 80's.Tried weaning off  Milrinone drip but co ox dropped. On it this am and co ox is 59.5%. Also on Lopressor 12.5 mg bid.  2. Pulmonary- On room air. Chest tube with 310 cc of output last 24 hours. CXR this am shows no pneumothorax, stable cardiomegaly. Leave chest tube for now. Encourage incentive spirometer and flutter valve 3. Volume overload-On Lasix 40 mg IV daily. 4. ABL anemia- H and H stable at 8.9 and 27 (s/p transfusion). Continue Folic acid 5. Mild thrombocytopenia-platelets slightly decreased to 127,000. 5. Has history of DVT,PE so will will start Lovenox (as per Dr. Prescott Gum). Will eventually need to restart Xarelto. 6. Supplement potassium 7. CBGs 107/130/88. HGA1C 5.8 so he is pre diabetic. Stop accu checks and SS.  8. Patient still with foley as has been aggressively diuresed. Will discuss with surgeon when to remove  ZIMMERMAN,DONIELLE M PA-C 05/19/2016 9:43 AM    I have seen and examined  the patient and agree with the assessment and plan as outlined.  Rexene Alberts, MD 05/19/2016

## 2016-05-19 NOTE — Progress Notes (Signed)
Physical Therapy Treatment Patient Details Name: Patrick Fritz MRN: [id number] DOB: March 30, 1951 Today's Date: 01-Jun-2016    History of Present Illness Pt adm with MI and underwent CABG and repair of aortic aneurysm on 05/14/16. PMH - DVT, PE, HTN, arthritis    PT Comments    Pt much improved with mobility and much more awake and alert.   Follow Up Recommendations  Home health PT;Supervision/Assistance - 24 hour     Equipment Recommendations  Other (comment) (rollator)    Recommendations for Other Services       Precautions / Restrictions Precautions Precautions: Fall;Sternal    Mobility  Bed Mobility Overal bed mobility: Needs Assistance Bed Mobility: Sidelying to Sit;Sit to Sidelying   Sidelying to sit: +2 for physical assistance;Mod assist     Sit to sidelying: +2 for physical assistance;Mod assist General bed mobility comments: Assist to bring legs over and elevate trunk into sitting. Assist to lower trunk and bring legs back into bed.  Transfers Overall transfer level: Needs assistance Equipment used: 4-wheeled walker   Sit to Stand: +2 physical assistance;Mod assist         General transfer comment: Assist to bring hips up and for balance.   Ambulation/Gait     Assistive device: 4-wheeled walker   Gait velocity: decr   General Gait Details: Assist for balance and support. On initial gait pt with slower responses and blank look and began leaning heavily forward. Brought recliner to pt. On 2nd amb pt more alert and with improved gait.   Stairs            Wheelchair Mobility    Modified Rankin (Stroke Patients Only)       Balance     Sitting balance-Leahy Scale: Poor Sitting balance - Comments: UE support     Standing balance-Leahy Scale: Poor                      Cognition Arousal/Alertness: Awake/alert Behavior During Therapy: WFL for tasks assessed/performed Overall Cognitive Status: Within Functional Limits for tasks  assessed                      Exercises      General Comments        Pertinent Vitals/Pain Pain Assessment: No/denies pain    Home Living                      Prior Function            PT Goals (current goals can now be found in the care plan section) Acute Rehab PT Goals Patient Stated Goal: return home PT Goal Formulation: With patient Time For Goal Achievement: 06/01/16 Potential to Achieve Goals: Good    Frequency  Min 3X/week    PT Plan      Co-evaluation             End of Session   Activity Tolerance: Patient limited by fatigue Patient left: in bed;with call bell/phone within reach;with nursing/sitter in room     Time: 1322-1341 PT Time Calculation (min) (ACUTE ONLY): 19 min  Charges:  $Gait Training: 8-22 mins                    G Codes:      Kevork Joyce June 01, 2016, 1:58 PM [employer] PT [phone]

## 2016-05-19 NOTE — Progress Notes (Signed)
Peripherally Inserted Central Catheter/Midline Placement  The IV Nurse has discussed with the patient and/or persons authorized to consent for the patient, the purpose of this procedure and the potential benefits and risks involved with this procedure.  The benefits include less needle sticks, lab draws from the catheter and patient may be discharged home with the catheter.  Risks include, but not limited to, infection, bleeding, blood clot (thrombus formation), and puncture of an artery; nerve damage and irregular heat beat.  Alternatives to this procedure were also discussed.  Discussed the possiblility of PICC exchange if needed.  PICC/Midline Placement Documentation        Kellie Murrill, Nicolette Bang 05/19/2016, 4:51 PM

## 2016-05-20 ENCOUNTER — Inpatient Hospital Stay (HOSPITAL_COMMUNITY): Payer: Medicare Other

## 2016-05-20 LAB — COMPREHENSIVE METABOLIC PANEL
ALT: 39 U/L (ref 17–63)
AST: 36 U/L (ref 15–41)
Albumin: 2.4 g/dL — ABNORMAL LOW (ref 3.5–5.0)
Alkaline Phosphatase: 78 U/L (ref 38–126)
Anion gap: 9 (ref 5–15)
BUN: 16 mg/dL (ref 6–20)
CO2: 30 mmol/L (ref 22–32)
Calcium: 8.5 mg/dL — ABNORMAL LOW (ref 8.9–10.3)
Chloride: 99 mmol/L — ABNORMAL LOW (ref 101–111)
Creatinine, Ser: 0.87 mg/dL (ref 0.61–1.24)
GFR calc Af Amer: >60 mL/min (ref >60)
GFR calc non Af Amer: >60 mL/min (ref >60)
Glucose, Bld: 104 mg/dL — ABNORMAL HIGH (ref 65–99)
Potassium: 3.7 mmol/L (ref 3.5–5.1)
Sodium: 138 mmol/L (ref 135–145)
Total Bilirubin: 1 mg/dL (ref 0.3–1.2)
Total Protein: 4.8 g/dL — ABNORMAL LOW (ref 6.5–8.1)

## 2016-05-20 LAB — CBC
HCT: 28.4 % — ABNORMAL LOW (ref 39.0–52.0)
Hemoglobin: 9.3 g/dL — ABNORMAL LOW (ref 13.0–17.0)
MCH: 29.4 pg (ref 26.0–34.0)
MCHC: 32.7 g/dL (ref 30.0–36.0)
MCV: 89.9 fL (ref 78.0–100.0)
Platelets: 206 10*3/uL (ref 150–400)
RBC: 3.16 MIL/uL — ABNORMAL LOW (ref 4.22–5.81)
RDW: 13.9 % (ref 11.5–15.5)
WBC: 8.9 10*3/uL (ref 4.0–10.5)

## 2016-05-20 LAB — PROTIME-INR
INR: 1.21
Prothrombin Time: 15.4 seconds — ABNORMAL HIGH (ref 11.4–15.2)

## 2016-05-20 LAB — CARBOXYHEMOGLOBIN
CARBOXYHEMOGLOBIN: 1.5 % (ref 0.5–1.5)
METHEMOGLOBIN: 1.2 % (ref 0.0–1.5)
O2 SAT: 68.5 %
TOTAL HEMOGLOBIN: 9.4 g/dL — AB (ref 13.5–18.0)

## 2016-05-20 MED ORDER — SODIUM CHLORIDE 0.9 % IV SOLN: 250.0000 mL | INTRAVENOUS | Status: DC | PRN

## 2016-05-20 MED ORDER — SODIUM CHLORIDE 0.9% FLUSH
3.0000 mL | Freq: Two times a day (BID) | INTRAVENOUS | Status: DC
  Administered 2016-05-21: 3 mL via INTRAVENOUS

## 2016-05-20 MED ORDER — FUROSEMIDE 40 MG PO TABS
40.0000 mg | ORAL_TABLET | Freq: Two times a day (BID) | ORAL | Status: DC
  Filled 2016-05-20: qty 1

## 2016-05-20 MED ORDER — SODIUM CHLORIDE 0.9% FLUSH: 3.0000 mL | INTRAVENOUS | Status: DC | PRN

## 2016-05-20 MED ORDER — FUROSEMIDE 40 MG PO TABS
40.0000 mg | ORAL_TABLET | Freq: Two times a day (BID) | ORAL | Status: DC
Start: 2016-05-20 — End: 2016-05-23
  Administered 2016-05-20 – 2016-05-23 (×8): 40 mg via ORAL
  Filled 2016-05-20 (×6): qty 1

## 2016-05-20 MED ORDER — POTASSIUM CHLORIDE 10 MEQ/50ML IV SOLN
10.0000 meq | INTRAVENOUS | Status: AC | PRN
  Administered 2016-05-20 (×3): 10 meq via INTRAVENOUS
  Filled 2016-05-20: qty 50

## 2016-05-20 MED ORDER — MOVING RIGHT ALONG BOOK
Freq: Once | Status: AC
  Filled 2016-05-20: qty 1

## 2016-05-20 MED ORDER — POTASSIUM CHLORIDE CRYS ER 20 MEQ PO TBCR
20.0000 meq | EXTENDED_RELEASE_TABLET | Freq: Two times a day (BID) | ORAL | Status: DC
  Administered 2016-05-21 – 2016-05-23 (×5): 20 meq via ORAL
  Filled 2016-05-20 (×5): qty 1

## 2016-05-20 MED ORDER — MORPHINE SULFATE (PF) 2 MG/ML IV SOLN: 1.0000 mg | INTRAVENOUS | Status: DC | PRN

## 2016-05-20 MED ORDER — SODIUM CHLORIDE 0.9 % IV SOLN
INTRAVENOUS | Status: DC
Start: 2016-05-20 — End: 2016-05-23
  Administered 2016-05-20: 08:00:00 via INTRAVENOUS

## 2016-05-20 NOTE — Progress Notes (Signed)
Pt transferred to 2W.  Report was called to [place].  All VS were WNL upon arrival.  Wife met [place] at the pt's new room.

## 2016-05-20 NOTE — Progress Notes (Addendum)
      [address]       [place] [address]             [phone]        CARDIOTHORACIC SURGERY PROGRESS NOTE   R6 Days Post-Op Procedure(s) (LRB): CORONARY ARTERY BYPASS GRAFTING (CABG) times two with LIMA to LAD and right leg SVG to PDA,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 (N/A)  Subjective: Looks good and feels well.  Objective: Vital signs: BP Readings from Last 1 Encounters:  05/20/16 111/72   Pulse Readings from Last 1 Encounters:  05/20/16 93   Resp Readings from Last 1 Encounters:  05/20/16 (!) 25   Temp Readings from Last 1 Encounters:  05/20/16 98.1 F (36.7 C) (Oral)    Hemodynamics: Mixed venous co-ox 68.5%   Physical Exam:  Rhythm:   sinus  Breath sounds: clear  Heart sounds:  RRR  Incisions:  Clean and dry  Abdomen:  Soft, non-distended, non-tender  Extremities:  Warm, well-perfused  Chest tubes:  low volume thin serosanguinous output, no air leak     Intake/Output from previous day: 08/04 0701 - 08/05 0700 In: 2493.6 [P.O.:1560; I.V.:933.6] Out: 4500 [Urine:4350; Chest Tube:150] Intake/Output this shift: Total I/O In: 686.7 [P.O.:600; I.V.:86.7] Out: 345 [Urine:315; Chest Tube:30]  Lab Results:  CBC: Recent Labs  05/19/16 0400 05/20/16 0500  WBC 10.4 8.9  HGB 8.9* 9.3*  HCT 27.0* 28.4*  PLT 177 206    BMET:  Recent Labs  05/19/16 0400 05/20/16 0500  NA 136 138  K 4.0 3.7  CL 100* 99*  CO2 29 30  GLUCOSE 108* 104*  BUN 20 16  CREATININE 0.94 0.87  CALCIUM 8.1* 8.5*     PT/INR:   Recent Labs  05/20/16 0500  LABPROT 15.4*  INR 1.21    CBG (last 3)   Recent Labs  05/18/16 1929 05/19/16 0812 05/19/16 1202  GLUCAP 130* 88 111*    ABG    Component Value Date/Time   PHART 7.390 05/15/2016 1548   PCO2ART 34.7 (L) 05/15/2016 1548   PO2ART 97.0 05/15/2016 1548   HCO3 21.1 05/15/2016 1548   TCO2 27 05/18/2016 2105   ACIDBASEDEF 4.0 (H) 05/15/2016 1548   O2SAT 68.5 05/20/2016  0405    CXR: PORTABLE CHEST 1 VIEW  COMPARISON:  05/19/2016  FINDINGS: Surgical staples over the RIGHT axilla. Sternotomy wires overlie enlarged cardiac silhouette. Exam is lordotic. LEFT PICC line unchanged. LEFT chest tube without pneumothorax. Mild central venous congestion. Removal of IJ sheath.  IMPRESSION: Cardiomegaly and central venous congestion.  No pneumothorax.   Electronically Signed   By: Suzy Bouchard M.D.   On: 05/20/2016 07:37   Assessment/Plan: S/P Procedure(s) (LRB): CORONARY ARTERY BYPASS GRAFTING (CABG) times two with LIMA to LAD and right leg SVG to PDA,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 (N/A)  Overall stable now POD6 Maintaining NSR w/ stable BP, co-ox up to 68.5% this morning Expected post op acute blood loss anemia, stable Acute on chronic combined systolic and diastolic CHF with expected post-op volume excess, improved   Wean milrinone slowly  Consider adding low dose ACE-I   Mobilize  Diuresis  Stop daily prothrombin times - no indication for warfarin  Transfer step down  Rexene Alberts, MD 05/20/2016 10:41 AM

## 2016-05-21 ENCOUNTER — Inpatient Hospital Stay (HOSPITAL_COMMUNITY): Payer: Medicare Other

## 2016-05-21 DIAGNOSIS — R079 Chest pain, unspecified: Secondary | ICD-10-CM

## 2016-05-21 DIAGNOSIS — I209 Angina pectoris, unspecified: Secondary | ICD-10-CM

## 2016-05-21 LAB — CBC
HCT: 29.8 % — ABNORMAL LOW (ref 39.0–52.0)
HEMOGLOBIN: 9.5 g/dL — AB (ref 13.0–17.0)
MCH: 28.9 pg (ref 26.0–34.0)
MCHC: 31.9 g/dL (ref 30.0–36.0)
MCV: 90.6 fL (ref 78.0–100.0)
Platelets: 226 10*3/uL (ref 150–400)
RBC: 3.29 MIL/uL — AB (ref 4.22–5.81)
RDW: 14.2 % (ref 11.5–15.5)
WBC: 9.6 10*3/uL (ref 4.0–10.5)

## 2016-05-21 LAB — CARBOXYHEMOGLOBIN
Carboxyhemoglobin: 1.5 % (ref 0.5–1.5)
Methemoglobin: 1.1 % (ref 0.0–1.5)
O2 Saturation: 56.6 %
Total hemoglobin: 9.9 g/dL — ABNORMAL LOW (ref 13.5–18.0)

## 2016-05-21 LAB — BASIC METABOLIC PANEL
Anion gap: 7 (ref 5–15)
BUN: 17 mg/dL (ref 6–20)
CHLORIDE: 102 mmol/L (ref 101–111)
CO2: 29 mmol/L (ref 22–32)
CREATININE: 0.92 mg/dL (ref 0.61–1.24)
Calcium: 8.6 mg/dL — ABNORMAL LOW (ref 8.9–10.3)
GFR calc non Af Amer: >60 mL/min (ref >60)
Glucose, Bld: 111 mg/dL — ABNORMAL HIGH (ref 65–99)
Potassium: 3.9 mmol/L (ref 3.5–5.1)
SODIUM: 138 mmol/L (ref 135–145)

## 2016-05-21 MED ORDER — POTASSIUM CHLORIDE CRYS ER 20 MEQ PO TBCR
20.0000 meq | EXTENDED_RELEASE_TABLET | Freq: Once | ORAL | Status: AC
  Administered 2016-05-21: 20 meq via ORAL
  Filled 2016-05-21: qty 1

## 2016-05-21 NOTE — Progress Notes (Addendum)
      [address]       [place] [address]             [phone]        7 Days Post-Op Procedure(s) (LRB): CORONARY ARTERY BYPASS GRAFTING (CABG) times two with LIMA to LAD and right leg SVG to PDA,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 (N/A)  Subjective: Patient without specific complaints this am. He hopes to go home soon.  Objective: Vital signs in last 24 hours: Temp:  [97.9 F (36.6 C)-98.8 F (37.1 C)] 98.5 F (36.9 C) (08/06 0438) Pulse Rate:  [78-93] 83 (08/06 0438) Cardiac Rhythm: Normal sinus rhythm (08/06 0719) Resp:  [16-29] 20 (08/06 0438) BP: (107-129)/(64-79) 122/68 (08/06 0438) SpO2:  [95 %-100 %] 96 % (08/06 0438) Weight:  [250 lb (113.4 kg)] 250 lb (113.4 kg) (08/06 0438)  Pre op weight 111 kg Current Weight  05/21/16 250 lb (113.4 kg)      Intake/Output from previous day: 08/05 0701 - 08/06 0700 In: [id number] [P.O.:1320; I.V.:261; IV Piggyback:50] Out: 1880 [Urine:1850; Chest Tube:30]   Physical Exam:  Cardiovascular: RRR, no murmur Pulmonary: Slightly diminished at bases Abdomen: Soft, non tender, bowel sounds present. Extremities: Mild bilateral lower extremity edema. Wounds: Clean and dry.  No erythema or signs of infection.  Lab Results: CBC: Recent Labs  05/20/16 0500 05/21/16 0505  WBC 8.9 9.6  HGB 9.3* 9.5*  HCT 28.4* 29.8*  PLT 206 226   BMET:  Recent Labs  05/20/16 0500 05/21/16 0505  NA 138 138  K 3.7 3.9  CL 99* 102  CO2 30 29  GLUCOSE 104* 111*  BUN 16 17  CREATININE 0.87 0.92  CALCIUM 8.5* 8.6*    PT/INR:  Lab Results  Component Value Date   INR 1.21 05/20/2016   INR 1.24 05/19/2016   INR 1.41 05/17/2016   ABG:  INR: Will add last result for INR, ABG once components are confirmed Will add last 4 CBG results once components are confirmed  Assessment/Plan: 1. CV- S/p STEMI. SR in the 80's.On Milrinone drip and  Lopressor 12.5 mg bid. Will start low dose when BP allows. Await co  ox to determine if may  wean off Milrinone 2. Pulmonary- On room air.CXR this am shows stable cardiomegaly and questionable small right apical pneumothorax.Encourage incentive spirometer and flutter valve 3. Acute on chronic systolic and diastolic CHF-On Lasix 40 mg po bid 4. ABL anemia- H and H stable at 9.5 and 29.8. Continue Folic acid 5. Mild thrombocytopenia-platelets slightly decreased to 127,000. 5. Has history of DVT,PE has been on Lovenox. Will eventually need to restart Xarelto. 6. Supplement potassium 7. Will remove every other right staple in am    ZIMMERMAN,DONIELLE MPA-C 05/21/2016,7:58 AM  I have seen and examined the patient and agree with the assessment and plan as outlined.  Stop milrinone.  Rexene Alberts, MD 05/21/2016 10:53 AM

## 2016-05-22 DIAGNOSIS — Z951 Presence of aortocoronary bypass graft: Secondary | ICD-10-CM

## 2016-05-22 DIAGNOSIS — Z95828 Presence of other vascular implants and grafts: Secondary | ICD-10-CM

## 2016-05-22 NOTE — Care Management Important Message (Signed)
Important Message  Patient Details  Name: Patrick Fritz MRN: [id number] Date of Birth: August 18, 1951   Medicare Important Message Given:  Yes    Laurin Morgenstern [place] 05/22/2016, 11:01 AM

## 2016-05-22 NOTE — Progress Notes (Signed)
Removed epicardial wires per order. 3 intact.  Pt tolerated procedure well.  Pt instructed to remain on bedrest for one hour.  Frequent vitals will be taken and documented. Pt resting with call bell within reach. Jules Baty McClintock, RN   

## 2016-05-22 NOTE — Discharge Instructions (Signed)
Coronary Artery Bypass Grafting, Care After °Refer to this sheet in the next few weeks. These instructions provide you with information on caring for yourself after your procedure. Your health care provider may also give you more specific instructions. Your treatment has been planned according to current medical practices, but problems sometimes occur. Call your health care provider if you have any problems or questions after your procedure. °WHAT TO EXPECT AFTER THE PROCEDURE °Recovery from surgery will be different for everyone. Some people feel well after 3 or 4 weeks, while for others it takes longer. After your procedure, it is typical to have the following: °· Nausea and a lack of appetite.   °· Constipation. °· Weakness and fatigue.   °· Depression or irritability.   °· Pain or discomfort at your incision site. °HOME CARE INSTRUCTIONS °· Take medicines only as directed by your health care provider. Do not stop taking medicines or start any new medicines without first checking with your health care provider. °· Take your pulse as directed by your health care provider. °· Perform deep breathing as directed by your health care provider. If you were given a device called an incentive spirometer, use it to practice deep breathing several times a day. Support your chest with a pillow or your arms when you take deep breaths or cough. °· Keep incision areas clean, dry, and protected. Remove or change any bandages (dressings) only as directed by your health care provider. You may have skin adhesive strips over the incision areas. Do not take the strips off. They will fall off on their own. °· Check incision areas daily for any swelling, redness, or drainage. °· If incisions were made in your legs, do the following: °¨ Avoid crossing your legs.   °¨ Avoid sitting for long periods of time. Change positions every 30 minutes.   °¨ Elevate your legs when you are sitting. °· Wear compression stockings as directed by your  health care provider. These stockings help keep blood clots from forming in your legs. °· Take showers once your health care provider approves. Until then, only take sponge baths. Pat incisions dry. Do not rub incisions with a washcloth or towel. Do not take baths, swim, or use a hot tub until your health care provider approves. °· Eat foods that are high in fiber, such as raw fruits and vegetables, whole grains, beans, and nuts. Meats should be lean cut. Avoid canned, processed, and fried foods. °· Drink enough fluid to keep your urine clear or pale yellow. °· Weigh yourself every day. This helps identify if you are retaining fluid that may make your heart and lungs work harder. °· Rest and limit activity as directed by your health care provider. You may be instructed to: °¨ Stop any activity at once if you have chest pain, shortness of breath, irregular heartbeats, or dizziness. Get help right away if you have any of these symptoms. °¨ Move around frequently for short periods or take short walks as directed by your health care provider. Increase your activities gradually. You may need physical therapy or cardiac rehabilitation to help strengthen your muscles and build your endurance. °¨ Avoid lifting, pushing, or pulling anything heavier than 10 lb (4.5 kg) for at least 6 weeks after surgery. °· Do not drive until your health care provider approves.  °· Ask your health care provider when you may return to work. °· Ask your health care provider when you may resume sexual activity. °· Keep all follow-up visits as directed by your health care   provider. This is important. °SEEK MEDICAL CARE IF: °· You have swelling, redness, increasing pain, or drainage at the site of an incision. °· You have a fever. °· You have swelling in your ankles or legs. °· You have pain in your legs.   °· You gain 2 or more pounds (0.9 kg) a day. °· You are nauseous or vomit. °· You have diarrhea.  °SEEK IMMEDIATE MEDICAL CARE IF: °· You have  chest pain that goes to your jaw or arms. °· You have shortness of breath.   °· You have a fast or irregular heartbeat.   °· You notice a "clicking" in your breastbone (sternum) when you move.   °· You have numbness or weakness in your arms or legs. °· You feel dizzy or light-headed.   °MAKE SURE YOU: °· Understand these instructions. °· Will watch your condition. °· Will get help right away if you are not doing well or get worse. °  °This information is not intended to replace advice given to you by your health care provider. Make sure you discuss any questions you have with your health care provider. °  °Document Released: 04/21/2005 Document Revised: 10/23/2014 Document Reviewed: 03/11/2013 °Elsevier Interactive Patient Education ©2016 [employer] ° °Endoscopic Saphenous Vein Harvesting, Care After °Refer to this sheet in the next few weeks. These instructions provide you with information on caring for yourself after your procedure. Your health care provider may also give you more specific instructions. Your treatment has been planned according to current medical practices, but problems sometimes occur. Call your health care provider if you have any problems or questions after your procedure. °HOME CARE INSTRUCTIONS °Medicine °· Take whatever pain medicine your surgeon prescribes. Follow the directions carefully. Do not take over-the-counter pain medicine unless your surgeon says it is okay. Some pain medicine can cause bleeding problems for several weeks after surgery. °· Follow your surgeon's instructions about driving. You will probably not be permitted to drive after heart surgery. °· Take any medicines your surgeon prescribes. Any medicines you took before your heart surgery should be checked with your health care provider before you start taking them again. °Wound care °· If your surgeon has prescribed an elastic bandage or stocking, ask how long you should wear it. °· Check the area around your surgical  cuts (incisions) whenever your bandages (dressings) are changed. Look for any redness or swelling. °· You will need to return to have the stitches (sutures) or staples taken out. Ask your surgeon when to do that. °· Ask your surgeon when you can shower or bathe. °Activity °· Try to keep your legs raised when you are sitting. °· Do any exercises your health care providers have given you. These may include deep breathing exercises, coughing, walking, or other exercises. °SEEK MEDICAL CARE IF: °· You have any questions about your medicines. °· You have more leg pain, especially if your pain medicine stops working. °· New or growing bruises develop on your leg. °· Your leg swells, feels tight, or becomes red. °· You have numbness in your leg. °SEEK IMMEDIATE MEDICAL CARE IF: °· Your pain gets much worse. °· Blood or fluid leaks from any of the incisions. °· Your incisions become warm, swollen, or red. °· You have chest pain. °· You have trouble breathing. °· You have a fever. °· You have more pain near your leg incision. °MAKE SURE YOU: °· Understand these instructions. °· Will watch your condition. °· Will get help right away if you are not doing well or   get worse. °  °This information is not intended to replace advice given to you by your health care provider. Make sure you discuss any questions you have with your health care provider. °  °Document Released: 06/14/2011 Document Revised: 10/23/2014 Document Reviewed: 06/14/2011 °Elsevier Interactive Patient Education ©2016 [employer] Inc. ° ° °

## 2016-05-22 NOTE — Progress Notes (Signed)
CARDIAC REHAB PHASE I   PRE:  Rate/Rhythm: 87 SR    BP: sitting 110/67    SaO2: 96 RA  MODE:  Ambulation: 550 ft   POST:  Rate/Rhythm: 98 SR    BP: sitting 131/73     SaO2: 98 RA  Pt able to stand independently and walk with RW. No c/o, feels well. Gave reminders for sternal precautions. Pt sts he walked x3 yesterday. Return to recliner, VSS, doing very well. Will f/u tomorrow. Pt is determining if he wants RW for home or not. [place], ACSM 05/22/2016 9:06 AM

## 2016-05-22 NOTE — Discharge Summary (Signed)
Physician Discharge Summary  Patient ID: Patrick Fritz MRN: [id number] DOB/AGE: 67-Sep-1952 67 y.o.  Admit date: 05/14/2016 Discharge date: 05/23/2016  Admission Diagnoses:  Patient Active Problem List   Diagnosis Date Noted  . S/P CABG x 2 05/22/2016  . Hx of ascending aorta replacement 05/22/2016  . Chest pain   . STEMI (ST elevation myocardial infarction) ([place]) 05/15/2016  . ST elevation myocardial infarction (STEMI) ([place])   . Obstructive sleep apnea (adult) (pediatric) 12/16/2013  . Morbid obesity ([place]) 12/16/2013  . SOB (shortness of breath) 12/16/2013   Discharge Diagnoses:   Patient Active Problem List   Diagnosis Date Noted  . S/P CABG x 2 05/22/2016  . Hx of ascending aorta replacement 05/22/2016  . Chest pain   . STEMI (ST elevation myocardial infarction) ([place]) 05/15/2016  . ST elevation myocardial infarction (STEMI) ([place])   . Obstructive sleep apnea (adult) (pediatric) 12/16/2013  . Morbid obesity ([place]) 12/16/2013  . SOB (shortness of breath) 12/16/2013   Discharged Condition: good  History of Present Illness:  [name] is a 65 yo white male who presented to the ED on 05/14/2016 with a 2 week complaint of chest pain, diaphoresis, and shortness of breath.  The pain had gotten worse the day of presentation prompting patient for evaluation.  He admitted to not taking his blood thinner for the past week.  Workup in ED showed ST changes on EKG and code STEMI was called.  He was taken emergently to the cath lab during which time PCI to the RCA resulted in dissection of the vessel and the root of the ascending aorta.  Due to this emergent CT surgery consult was obtained.   Hospital Course:   The patient was taken emergently to the operating room by Dr. Prescott Gum.  He underwent CABG x 2 utilizing LIMA to LAD, and SVG to PDA, as well as Replacement of ascending aorta, and endoscopic harvest of greater saphenous vein from his right leg.  He tolerated the procedure and was taken  to the SICU in stable condition.  He was extubated the evening of surgery.  During his stay in the SICU the patient was weaned off Milrinone as tolerated.  His chest tubes and arterial lines were removed without difficulty.  He required lasix drip for hypervolemia.  He developed mild thrombocytopenia.  he required transfusion for acute post operative blood loss anemia.  He was maintaining NSR and felt medically stable for transfer to the step down unit on POD #6. The patient continues to make progress.  He has been weaned off Milrinone and repeat Co-ox has been satisfactory.  His continues to maintain NSR and his pacing wires have been removed without difficulty.  He was restarted on home Xarelto for chronic DVT.            Significant Diagnostic Studies: angiography:    Acute inferior ST elevation myocardial infarction due to total occlusion of the mid right coronary.  PCI on the right coronary lesion was complicated by extensive catheter-induced antegrade and retrograde dissection with associated reperfusion of the distal RCA but also extension into the ascending aorta producing a peri-ostial hematoma.  Severe ostial LAD (85%) and moderate proximal LAD (60-70%).  50% distal circumflex.  50% proximal/ostial ramus intermedius which is a large trifurcating vessel.  Overall normal left ventricular systolic function with inferior wall moderate hypokinesis. EF 50%.  Treatments: surgery:   1. Emergency coronary artery bypass grafting x2 (left internal mammary     artery to  left anterior descending coronary artery, saphenous vein     graft to distal posterior descending). 2. Emergency repair of type A ascending aortic dissection with a 26 mm     Hemashield straight graft replacement of the ascending aorta above     the sinotubular junction to the proximal arch. 3. Placement of femoral A-line, right. 4. Right axillary artery cannulation for cardiopulmonary bypass and     antegrade cerebral  perfusion during profound hypothermic     circulatory arrest.  Disposition: Home  Discharge medications:  The patient has been discharged on:   1.Beta Blocker:  Yes [ x  ]                              No   [   ]                              If No, reason:  2.Ace Inhibitor/ARB: Yes [   ]                                     No  [ n   ]                                     If No, reason:low BP  3.Statin:   Yes [ x  ]                  No  [   ]                  If No, reason:  4.Ecasa:  Yes  [ x  ]                  No   [   ]                  If No, reason:     Discharge Instructions    Amb Referral to Cardiac Rehabilitation    Complete by:  As directed   Diagnosis:   STEMI CABG     CABG X ___:  2       Medication List    STOP taking these medications   NON FORMULARY   prednisoLONE 5 MG Tabs tablet   sildenafil 100 MG tablet Commonly known as:  VIAGRA   XARELTO 20 MG Tabs tablet Generic drug:  rivaroxaban     TAKE these medications   allopurinol 100 MG tablet Commonly known as:  ZYLOPRIM Take 100 mg by mouth daily.   aspirin 325 MG EC tablet Take 1 tablet (325 mg total) by mouth daily.   folic acid 1 MG tablet Commonly known as:  FOLVITE Take 1 tablet (1 mg total) by mouth daily.   furosemide 40 MG tablet Commonly known as:  LASIX Take 1 tablet (40 mg total) by mouth 2 (two) times daily.   gabapentin 100 MG capsule Commonly known as:  NEURONTIN Take 100 mg by mouth 2 (two) times daily.   hydroxychloroquine 200 MG tablet Commonly known as:  PLAQUENIL Take by mouth daily. Take with food or milk   metoprolol tartrate 25 MG tablet Commonly known as:  LOPRESSOR Take 0.5 tablets (12.5 mg total)  by mouth 2 (two) times daily.   oxyCODONE 5 MG immediate release tablet Commonly known as:  Oxy IR/ROXICODONE Take 1-2 tablets (5-10 mg total) by mouth every 6 (six) hours as needed for severe pain.   potassium chloride SA 20 MEQ tablet Commonly known  as:  K-DUR,KLOR-CON Take 1 tablet (20 mEq total) by mouth daily.   simvastatin 20 MG tablet Commonly known as:  ZOCOR Take 20 mg by mouth daily.      Follow-up Information    Tharon Aquas Trigt III, MD Follow up on 06/21/2016.   Specialty:  Cardiothoracic Surgery Why:  Appointment is at 12:30, please get CXR at 12:00 at [facility] located on first floor of our office building Contact information: [place] [address] [phone]        Cecilie Kicks, NP. Go on 06/08/2016.   Specialties:  Cardiology, Radiology Why:  @9 :30 for post hospital  Contact information: [address] [place] [address] [phone]        Inc[facility] - Dme [facility] .   Why:  rolling walker arranged- to be delivered to room prior to discharge Contact information: [address] [place] [address] [phone]           Signed: Ellwood Handler 05/23/2016, 2:49 PM

## 2016-05-22 NOTE — Progress Notes (Signed)
Physical Therapy Discharge Patient Details Name: Patrick Fritz MRN: [id number] DOB: 07-25-1951 Today's Date: 05/22/2016 Time: 3953-2023 PT Time Calculation (min) (ACUTE ONLY): 10 min  Patient discharged from PT services secondary to goals met and no further PT needs identified.  Please see latest therapy progress note for current level of functioning and progress toward goals.    Progress and discharge plan discussed with patient and/or caregiver: Patient/Caregiver agrees with plan  GP     [facility] 05/22/2016, 2:38 PM  [facility] PT [phone]

## 2016-05-22 NOTE — Care Management Note (Signed)
Case Management Note Marvetta Gibbons RN, BSN Unit 2W-Case Manager [phone]  Patient Details  Name: DEMARCOS PALOMARES MRN: [id number] Date of Birth: 26-Feb-1951  Subjective/Objective:     Pt admitted with STEMI, s/p CABG x2-  tx from ICU to 2W on 05/20/16-                Action/Plan: PTA Pt lived at home with spouse- CM to follow for d/c needs- per PT notes- recommendations for HHPT  Expected Discharge Date:                  Expected Discharge Plan:  [place]  In-House Referral:     Discharge planning Services  CM Consult  Post Acute Care Choice:    Choice offered to:     DME Arranged:    DME Agency:     HH Arranged:    [place] Agency:     Status of Service:  In process, will continue to follow  If discussed at Long Length of Stay Meetings, dates discussed:    Additional Comments:  Dawayne Patricia, RN 05/22/2016, 2:05 PM

## 2016-05-22 NOTE — Progress Notes (Signed)
Removed remaining 8 staples from right upper chest.  Patient tolerated procedure well. Pt resting with call bell within reach.  Will continue to monitor. Payton Emerald, RN

## 2016-05-22 NOTE — Progress Notes (Addendum)
[address]       [employer] [address]             [phone]      8 Days Post-Op Procedure(s) (LRB): CORONARY ARTERY BYPASS GRAFTING (CABG) times two with LIMA to LAD and right leg SVG to PDA,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 (N/A) Subjective: Feels well, no specific complaints  Objective: Vital signs in last 24 hours: Temp:  [98.2 F (36.8 C)-98.5 F (36.9 C)] 98.2 F (36.8 C) (08/07 0535) Pulse Rate:  [76-86] 76 (08/07 0535) Cardiac Rhythm: Heart block (08/06 2138) Resp:  [19] 19 (08/07 0535) BP: (111-118)/(60-65) 116/60 (08/07 0535) SpO2:  [96 %-98 %] 96 % (08/07 0535) Weight:  [247 lb (112 kg)] 247 lb (112 kg) (08/07 0535)  Hemodynamic parameters for last 24 hours:    Intake/Output from previous day: 08/06 0701 - 08/07 0700 In: 480 [P.O.:480] Out: 1950 [Urine:1950] Intake/Output this shift: No intake/output data recorded.  General appearance: alert, cooperative and no distress Heart: regular rate and rhythm Lungs: clear to auscultation bilaterally Abdomen: benign Extremities: + edema Wound: incis healing well  Lab Results:  Recent Labs  05/20/16 0500 05/21/16 0505  WBC 8.9 9.6  HGB 9.3* 9.5*  HCT 28.4* 29.8*  PLT 206 226   BMET:  Recent Labs  05/20/16 0500 05/21/16 0505  NA 138 138  K 3.7 3.9  CL 99* 102  CO2 30 29  GLUCOSE 104* 111*  BUN 16 17  CREATININE 0.87 0.92  CALCIUM 8.5* 8.6*    PT/INR:  Recent Labs  05/20/16 0500  LABPROT 15.4*  INR 1.21   ABG    Component Value Date/Time   PHART 7.390 05/15/2016 1548   HCO3 21.1 05/15/2016 1548   TCO2 27 05/18/2016 2105   ACIDBASEDEF 4.0 (H) 05/15/2016 1548   O2SAT 56.6 05/21/2016 1840   CBG (last 3)   Recent Labs  05/19/16 0812 05/19/16 1202  GLUCAP 88 111*    Meds Scheduled Meds: . aspirin EC  325 mg Oral Daily  . bisacodyl  10 mg Oral Daily   Or  . bisacodyl  10 mg Rectal Daily  . docusate sodium  200 mg Oral Daily  . enoxaparin  (LOVENOX) injection  40 mg Subcutaneous Q24H  . folic acid  1 mg Oral Daily  . furosemide  40 mg Oral BID  . metoprolol tartrate  12.5 mg Oral BID  . moving right along book   Does not apply Once  . pantoprazole  40 mg Oral Daily  . potassium chloride  20 mEq Oral BID  . simvastatin  20 mg Oral q1800  . sodium chloride flush  3 mL Intravenous Q12H  . sodium chloride flush  3 mL Intravenous Q12H   Continuous Infusions: . sodium chloride    . sodium chloride 20 mL/hr at 05/20/16 0730   PRN Meds:.sodium chloride, diphenhydrAMINE, metoprolol, morphine injection, ondansetron (ZOFRAN) IV, oxyCODONE, sodium chloride flush, sodium chloride flush, sodium chloride flush, traMADol  Xrays Dg Chest 2 View  Result Date: 05/21/2016 CLINICAL DATA:  Postop coronary artery bypass. EXAM: CHEST  2 VIEW COMPARISON:  May 20, 2016 FINDINGS: Surgical clips in the right axilla and skin staples on the right. No pneumothorax. Stable cardiomegaly. The hila and mediastinum are unchanged. Mild interstitial prominence, slightly more focal in the medial right lung base is similar in the interval. Small bilateral effusions pre No other interval changes. IMPRESSION: 1. Mild interstitial prominence more focal  in the medial right lung base, unchanged. Recommend attention on follow-up. No other acute abnormalities. 2. Small bilateral effusions. Electronically Signed   By: Dorise Bullion III M.D   On: 05/21/2016 08:31    Assessment/Plan: S/P Procedure(s) (LRB): CORONARY ARTERY BYPASS GRAFTING (CABG) times two with LIMA to LAD and right leg SVG to PDA,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 (N/A)  1 conts to do well 2 hemodyn stable, co-ox 56 today off milrinone, cont diuretic- current. BP probably too low for ACE/ARB 3 EPW's out today 4 no new labs  LOS: 7 days    GOLD,WAYNE E 05/22/2016 Patient seen and examined, agree with above Will see how he does off milrinone - recheck co-ox in AM  Micholas Drumwright C.  Roxan Hockey, MD [facility] [phone]

## 2016-05-22 NOTE — Progress Notes (Signed)
Physical Therapy Treatment Patient Details Name: Patrick Fritz MRN: [id number] DOB: December 15, 1950 Today's Date: Jun 10, 2016    History of Present Illness Pt adm with MI and underwent CABG and repair of aortic aneurysm on 05/14/16. PMH - DVT, PE, HTN, arthritis    PT Comments    Pt doing very well and now modified independent for all mobility. No further PT needed.  Follow Up Recommendations  No PT follow up     Equipment Recommendations  Rolling walker with 5" wheels    Recommendations for Other Services       Precautions / Restrictions Precautions Precautions: Sternal    Mobility  Bed Mobility Overal bed mobility: Modified Independent Bed Mobility: Sit to Supine   Sidelying to sit: Modified independent (Device/Increase time)   Sit to supine: Modified independent (Device/Increase time)      Transfers Overall transfer level: Modified independent Equipment used: None Transfers: Sit to/from Stand Sit to Stand: Modified independent (Device/Increase time)            Ambulation/Gait Ambulation/Gait assistance: Modified independent (Device/Increase time) Ambulation Distance (Feet): 550 Feet Assistive device: Rolling walker (2 wheeled);None Gait Pattern/deviations: Step-through pattern Gait velocity: decr Gait velocity interpretation: Below normal speed for age/gender General Gait Details: Amb in hallway with rolling walker and in room without assistive device   Stairs            Wheelchair Mobility    Modified Rankin (Stroke Patients Only)       Balance Overall balance assessment: Needs assistance Sitting-balance support: No upper extremity supported;Feet supported Sitting balance-Leahy Scale: Normal Sitting balance - Comments: UE support   Standing balance support: No upper extremity supported;During functional activity Standing balance-Leahy Scale: Good                      Cognition Arousal/Alertness: Awake/alert Behavior During  Therapy: WFL for tasks assessed/performed Overall Cognitive Status: Within Functional Limits for tasks assessed                      Exercises      General Comments        Pertinent Vitals/Pain      Home Living                      Prior Function            PT Goals (current goals can now be found in the care plan section) Acute Rehab PT Goals Patient Stated Goal: return home Progress towards PT goals: Goals met/education completed, patient discharged from PT    Frequency       PT Plan Discharge plan needs to be updated    Co-evaluation             End of Session   Activity Tolerance: Patient tolerated treatment well Patient left: in bed;with call bell/phone within reach     Time: 0254-2706 PT Time Calculation (min) (ACUTE ONLY): 10 min  Charges:  $Gait Training: 8-22 mins                    G Codes:      Patrick Fritz 06-10-2016, 2:37 PM [facility] PT [phone]

## 2016-05-23 LAB — CARBOXYHEMOGLOBIN
CARBOXYHEMOGLOBIN: 1.4 % (ref 0.5–1.5)
Methemoglobin: 1.1 % (ref 0.0–1.5)
O2 Saturation: 58.6 %
Total hemoglobin: 10 g/dL — ABNORMAL LOW (ref 13.5–18.0)

## 2016-05-23 MED ORDER — POTASSIUM CHLORIDE CRYS ER 20 MEQ PO TBCR: 20.0000 meq | EXTENDED_RELEASE_TABLET | Freq: Every day | ORAL | 0 refills | Status: DC

## 2016-05-23 MED ORDER — OXYCODONE HCL 5 MG PO TABS: 5.0000 mg | ORAL_TABLET | Freq: Four times a day (QID) | ORAL | 0 refills | Status: DC | PRN

## 2016-05-23 MED ORDER — FUROSEMIDE 40 MG PO TABS: 40.0000 mg | ORAL_TABLET | Freq: Two times a day (BID) | ORAL | 0 refills | Status: DC

## 2016-05-23 MED ORDER — FOLIC ACID 1 MG PO TABS: 1.0000 mg | ORAL_TABLET | Freq: Every day | ORAL | 1 refills | Status: DC

## 2016-05-23 MED ORDER — METOPROLOL TARTRATE 25 MG PO TABS: 12.5000 mg | ORAL_TABLET | Freq: Two times a day (BID) | ORAL | 1 refills | Status: DC

## 2016-05-23 MED ORDER — ASPIRIN 325 MG PO TBEC: 325.0000 mg | DELAYED_RELEASE_TABLET | Freq: Every day | ORAL | Status: DC

## 2016-05-23 NOTE — Progress Notes (Signed)
Instructed pt on removal of PICC line. HOB less than 45 degrees, pt held breath during removal. Held pressure for 40min, no s/sx of bleeding. Instructed pt to remain in bed for 8min after picc removal. Also instructed to  leave drsg CDI for 24 hours. Instructed on reporting s/sx of bleeding and how to manage. Pt VU. Fran Lowes, RN VAST

## 2016-05-23 NOTE — Progress Notes (Addendum)
Patient states he takes prednisone and three other medications not on medication list.  He has been taking medications for rheumatoid arthritis for a long time and is concerned about change.  I added prior to admission medications from mail order pharmacy.  Per patient, list should be complete now. PA will review prior to discharge. I will print new AVS. Kalman Shan Baird Kay, RN

## 2016-05-23 NOTE — Progress Notes (Signed)
Removed CT sutures and applied benzoin and 1/2 " steri strips.  Pt tolerated procedure well. Pt given signs and symptoms of infection. Pt resting with call bell within reach.  Will continue to monitor. Izzy Doubek McClintock, RN     

## 2016-05-23 NOTE — Progress Notes (Signed)
      [address]       [employer] [address]             [phone]      9 Days Post-Op Procedure(s) (LRB): CORONARY ARTERY BYPASS GRAFTING (CABG) times two with LIMA to LAD and right leg SVG to PDA,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 (N/A) Subjective: Feels pretty well, no new c/o  Objective: Vital signs in last 24 hours: Temp:  [98.1 F (36.7 C)-98.5 F (36.9 C)] 98.2 F (36.8 C) (08/08 0500) Pulse Rate:  [73-81] 73 (08/08 0500) Cardiac Rhythm: Normal sinus rhythm (08/07 2000) Resp:  [20] 20 (08/08 0500) BP: (98-115)/(55-76) 98/58 (08/08 0500) SpO2:  [98 %] 98 % (08/08 0500) Weight:  [242 lb (109.8 kg)] 242 lb (109.8 kg) (08/08 0500)  Hemodynamic parameters for last 24 hours:    Intake/Output from previous day: 08/07 0701 - 08/08 0700 In: -  Out: 575 [Urine:575] Intake/Output this shift: No intake/output data recorded.  General appearance: alert, cooperative and no distress Heart: regular rate and rhythm Lungs: clear to auscultation bilaterally Abdomen: benign Extremities: minor edema Wound: incis healing well  Lab Results:  Recent Labs  05/21/16 0505  WBC 9.6  HGB 9.5*  HCT 29.8*  PLT 226   BMET:  Recent Labs  05/21/16 0505  NA 138  K 3.9  CL 102  CO2 29  GLUCOSE 111*  BUN 17  CREATININE 0.92  CALCIUM 8.6*    PT/INR: No results for input(s): LABPROT, INR in the last 72 hours. ABG    Component Value Date/Time   PHART 7.390 05/15/2016 1548   HCO3 21.1 05/15/2016 1548   TCO2 27 05/18/2016 2105   ACIDBASEDEF 4.0 (H) 05/15/2016 1548   O2SAT 58.6 05/23/2016 0531   CBG (last 3)  No results for input(s): GLUCAP in the last 72 hours.  Meds Scheduled Meds: . aspirin EC  325 mg Oral Daily  . bisacodyl  10 mg Oral Daily   Or  . bisacodyl  10 mg Rectal Daily  . docusate sodium  200 mg Oral Daily  . enoxaparin (LOVENOX) injection  40 mg Subcutaneous Q24H  . folic acid  1 mg Oral Daily  . furosemide  40 mg Oral  BID  . metoprolol tartrate  12.5 mg Oral BID  . pantoprazole  40 mg Oral Daily  . potassium chloride  20 mEq Oral BID  . simvastatin  20 mg Oral q1800  . sodium chloride flush  3 mL Intravenous Q12H  . sodium chloride flush  3 mL Intravenous Q12H   Continuous Infusions: . sodium chloride    . sodium chloride 20 mL/hr at 05/20/16 0730   PRN Meds:.sodium chloride, diphenhydrAMINE, metoprolol, morphine injection, ondansetron (ZOFRAN) IV, oxyCODONE, sodium chloride flush, sodium chloride flush, sodium chloride flush, traMADol  Xrays No results found.  Assessment/Plan: S/P Procedure(s) (LRB): CORONARY ARTERY BYPASS GRAFTING (CABG) times two with LIMA to LAD and right leg SVG to PDA,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 (N/A)  1 clinically very stable, Co-OX 58.6 2 BP remains too low for ACEI/ARB 3 stable for D/C   LOS: 8 days    GOLD,WAYNE E 05/23/2016

## 2016-05-23 NOTE — Care Management Note (Signed)
Case Management Note Marvetta Gibbons RN, BSN Unit 2W-Case Manager [phone]  Patient Details  Name: Patrick Fritz MRN: [id number] Date of Birth: July 21, 1951  Subjective/Objective:     Pt admitted with STEMI, s/p CABG x2-  tx from ICU to 2W on 05/20/16-                Action/Plan: PTA Pt lived at home with spouse- CM to follow for d/c needs- per initial PT notes- recommendations for HHPT- pt now progressed to no f/u  Expected Discharge Date:   05/23/16               Expected Discharge Plan:  Home/Self Care  In-House Referral:     Discharge planning Services  CM Consult  Post Acute Care Choice:  Durable Medical Equipment Choice offered to:  Patient  DME Arranged:  [place] Rile rolling DME Agency:  [place] Arranged:  [place] Agency:  NA  Status of Service:  Completed, signed off  If discussed at [place] of Stay Meetings, dates discussed:    Additional Comments:  05/23/16- 0930- Marvetta Gibbons RN, BSN- pt for d/c home today- will not need HHPT however will need RW for home- order placed- and Jermaine with [facility] notified for DME need- RW to be delivered to room prior to discharge.   Dawayne Patricia, RN 05/23/2016, 9:49 AM

## 2016-05-23 NOTE — Progress Notes (Signed)
Pt/family given discharge instructions, medication lists, follow up appointments, and when to call the doctor.  Pt/family verbalizes understanding. Bellamarie Pflug McClintock, RN   

## 2016-05-23 NOTE — Progress Notes (Signed)
Ed completed with pt and wife. Voiced understanding and requests his referral be sent to [facility]. Set up d/c video for them. All questions answered. Pt needs RW for home. [place] CES, ACSM 9:14 AM 05/23/2016

## 2016-06-07 NOTE — Progress Notes (Addendum)
Cardiology Office Note   Date:  06/08/2016   ID:  Patrick Fritz, Patrick Fritz August 03, 1951, MRN [id number]  PCP:  Mayra Neer, MD  Cardiologist:  Dr. Tamala Julian    Chief Complaint  Patient presents with  . Hospitalization Follow-up    post CABG      History of Present Illness: Patrick Fritz is a 65 y.o. male who presents for post CABG eval.  Admitted 05/14/16 with Inf. STEMI -taken emergently to cath lab - PCI of the RCA with catheter-induced antegrade and retrograde dissection with associated reperfusion of the distal RCA but also extension into the ascending aorta producing a peri-ostial hematoma. additionally severe ostial LAD (85%) and moderate proximal LAD (60-70%).  Also 50% dLCX and 50% prox and ostial ramus EF 50%.  There was widening of the anterior aortic wall due to intramural hematoma/dissection. Eval by Dr. Prescott Gum and to OR with LIMA to LAD and VG- PDA, and repair of ascending aorta for type 1 dissection.    He has hx of chronic DVT and was placed back on Xarelto prior to discharge. Other hx includes OSA on CPAP.  Followed by Dr. Radford Pax.   At D/C he had hgb of 9.5, Cr of 0.87,   Today with depression -flat affect, but no SOB or chest pain.  He does complain of Rt hand numbness.  He has some swelling of ankles.  Rt > Lt.  His diet is still slow, no appetite.  He continues to walk for exercise.  He never rec'd Lasix at discharge.     Past Medical History:  Diagnosis Date  . DJD (degenerative joint disease), lumbar   . DVT (deep venous thrombosis) ([place])   . Glaucoma    R eye diminished vision approx 50%  . High cholesterol   . Hyperlipidemia   . Hypertension   . IFG (impaired fasting glucose)   . OSA (obstructive sleep apnea)    Severe w AHI 34/hr on HST no on BiPAP at 19/15cm H2O  . PE (pulmonary embolism)    Second occurance,enknown etiology,lifelong anticoagulation    Past Surgical History:  Procedure Laterality Date  . CARDIAC CATHETERIZATION N/A 05/14/2016   Procedure: Left Heart Cath and Coronary Angiography;  Surgeon: Belva Crome, MD;  Location: [facility];  Service: Cardiovascular;  Laterality: N/A;  . CORONARY ARTERY BYPASS GRAFT N/A 05/14/2016   Procedure: CORONARY ARTERY BYPASS GRAFTING (CABG) times two with LIMA to LAD and right leg SVG to PDA,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;  Surgeon: Ivin Poot, MD;  Location: [facility];  Service: Open Heart Surgery;  Laterality: N/A;     Current Outpatient Prescriptions  Medication Sig Dispense Refill  . allopurinol (ZYLOPRIM) 100 MG tablet Take 100 mg by mouth daily.    Marland Kitchen aspirin EC 325 MG EC tablet Take 1 tablet (325 mg total) by mouth daily.    . folic acid (FOLVITE) 1 MG tablet Take 1 tablet (1 mg total) by mouth daily. 30 tablet 1  . gabapentin (NEURONTIN) 100 MG capsule Take 100 mg by mouth 2 (two) times daily.    . hydroxychloroquine (PLAQUENIL) 200 MG tablet Take 200 mg by mouth daily. Take with food or milk     . metoprolol tartrate (LOPRESSOR) 25 MG tablet Take 0.5 tablets (12.5 mg total) by mouth 2 (two) times daily. 30 tablet 1  . Multiple Vitamins-Minerals (OCUVITE EYE HEALTH FORMULA PO) Take 1 tablet by mouth daily.    Marland Kitchen  oxyCODONE (OXY IR/ROXICODONE) 5 MG immediate release tablet Take 1-2 tablets (5-10 mg total) by mouth every 6 (six) hours as needed for severe pain. 30 tablet 0  . potassium chloride SA (K-DUR,KLOR-CON) 20 MEQ tablet Take 1 tablet (20 mEq total) by mouth daily. 30 tablet 0  . simvastatin (ZOCOR) 20 MG tablet Take 20 mg by mouth daily.     No current facility-administered medications for this visit.     Allergies:   Penicillins    Social History:  The patient  reports that he has never smoked. He has never used smokeless tobacco. He reports that he drinks alcohol.   Family History:  The patient's family history includes CAD in his father; Hypertension in his father; Osteoporosis in his mother.    ROS:  General:no colds or fevers, weight is  down 10 lbs, pt without appetite.  Skin:no rashes or ulcers HEENT:no blurred vision, no congestion CV:see HPI PUL:see HPI GI:no diarrhea constipation or melena, no indigestion GU:no hematuria, no dysuria MS:no joint pain, no claudication Neuro:no syncope, no lightheadedness Endo:no diabetes, no thyroid disease   Wt Readings from Last 3 Encounters:  06/08/16 231 lb (104.8 kg)  05/23/16 242 lb (109.8 kg)  12/17/13 244 lb (110.7 kg)     PHYSICAL EXAM: VS:  BP 128/72   Pulse 83   Ht 5\' 10"  (1.778 m)   Wt 231 lb (104.8 kg)   SpO2 97%   BMI 33.15 kg/m  , BMI Body mass index is 33.15 kg/m. General:Pleasant affect, NAD Skin:Warm and dry, brisk capillary refill HEENT:normocephalic, sclera clear, mucus membranes moist Neck:supple, no JVD, no bruits  Heart:S1S2 RRR without murmur, gallup, rub or click--chest incision is healing.   Lungs:clear without rales, rhonchi, or wheezes [id number], non tender, + BS, do not palpate liver spleen or masses Ext:1+ lower ext edema, 2+ pedal pulses, 2+ radial pulses Neuro:alert and oriented X 3, MAE, follows commands, + facial symmetry    EKG:  EKG is NOT ordered today.   Recent Labs: 05/16/2016: Magnesium 1.9 05/20/2016: ALT 39 05/21/2016: BUN 17; Creatinine, Ser 0.92; Hemoglobin 9.5; Platelets 226; Potassium 3.9; Sodium 138    Lipid Panel    Component Value Date/Time   CHOL 179 05/14/2016 1310   TRIG 165 (H) 05/14/2016 1310   HDL 45 05/14/2016 1310   CHOLHDL 4.0 05/14/2016 1310   VLDL 33 05/14/2016 1310   LDLCALC 101 (H) 05/14/2016 1310       Other studies Reviewed: Additional studies/ records that were reviewed today include: .  ECHO: Study Conclusions  - Procedure narrative: Transthoracic echocardiography. Image   quality was poor. The study was technically difficult, as a   result of poor acoustic windows, restricted patient mobility,   surgical dressings, and body habitus. - Left ventricle: Wall thickness was increased in a  pattern of mild   LVH. Systolic function was mildly reduced. The estimated ejection   fraction was in the range of 45% to 50%. Incoordinate septal   motion - inferior hypokinesis. The study is not technically   sufficient to allow evaluation of LV diastolic function.  Impressions:  - Limited images obtained. Contrast not given. LVEF of 45-50%,   incoordinate septal motion, inferior hypokinesis, mild LVH, no   pericardial effusion. Mildy improved LVEF compared to the most   recent study.   Cardiac CAth: Conclusion    Acute inferior ST elevation myocardial infarction due to total occlusion of the mid right coronary.  PCI on the right coronary lesion was  complicated by extensive catheter-induced antegrade and retrograde dissection with associated reperfusion of the distal RCA but also extension into the ascending aorta producing a peri-ostial hematoma.  Severe ostial LAD (85%) and moderate proximal LAD (60-70%).  50% distal circumflex.  50% proximal/ostial ramus intermedius which is a large trifurcating vessel.  Overall normal left ventricular systolic function with inferior wall moderate hypokinesis. EF 50%.  Recommendations:   Anticoagulation was immediately discontinued.  TCTS consult per Dr. Prescott Gum who has agreed to perform bypass surgery on the right coronary and LIMA to the LAD, and possibly Cfx territory.  Right coronary guidewire and guiding catheter left in place to provide scaffolding and hopefully keep the right coronary patent until grafts can be placed. Discussed removal of the guide catheter with Dr. Prescott Gum.  Indications   ST elevation myocardial infarction (STEMI), unspecified artery (HCC) [I21.3 (ICD-10-CM)]  ST elevation myocardial infarction involving right coronary artery (HCC) [I21.11 (ICD-10-CM)]     OR report PRE-OPERATIVE DIAGNOSIS:  MI  POST-OPERATIVE DIAGNOSIS:  CAD, Right Coronary Artery Dissection  PROCEDURE:   Procedure(s): CORONARY ARTERY BYPASS GRAFTING (CABG) times two with LIMA to LAD and right leg SVG to PDA,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 (N/A)  SURGEON:  Surgeon(s) and Role:    * Ivin Poot, MD - Primary  PHYSICIAN ASSISTANT: Sharen Heck  ASSISTANTS: Alcide Evener RNFA   ANESTHESIA:   general  EBL:  Total I/O In: 4834 [I.V.:2500; Blood:2334] Out: [id number] [Urine:1000; Blood:1755]  BLOOD ADMINISTERED:2 u CC PRBC, 2uFFP 2 u Plts  DRAINS:  2 MTs 2 PTs   LOCAL MEDICATIONS USED:  NONE  SPECIMEN:  Excision of dissected ascending aorta  DISPOSITION OF SPECIMEN:  PATHOLOGY  COUNTS:  YES  TOURNIQUET:  * No tourniquets in log *  DICTATION: .Dragon Dictation  PLAN OF CARE: Admit to inpatient   PATIENT DISPOSITION:  ICU - intubated and hemodynamically stable.     ASSESSMENT AND PLAN:  1. STEMI Inf. Wall  2. CAD with CABG X2 vessels and  3. Emergency repair of type A ascending aortic dissection with a 26 mm  Hemashield straight graft replacement of the ascending aorta above the sinotubular junction to the proximal arch  4. Hyperlipidemia  On simvastatin  5. OSA  6.  Hx of DVT and had been on long term anticoagulation - he had run out of Xarelto several days prior to admit.  Will check with TCTS concerning resuming.    ADDENDUM: Called Dr. Thayer Ohm office today (06/09/16 day after OV.)  and PA talked with Dr. Prescott Gum and we will resume xarelto.   Pt will follow up in 4-6 weeks with Dr. Tamala Julian.  All questions answered.  Also we discussed depresion- he will follow with PCP and it is ok to travel to [place] in [place]. Wife will drive.    Current medicines are reviewed with the patient today.  The patient Has no concerns regarding medicines.  The following changes have been made:  See above Labs/ tests ordered today include:see above  Disposition:   FU:  see above  Signed, Cecilie Kicks, NP  06/08/2016 9:57 AM    [facility], [place], [place], [place] Phone: [phone]; Fax: [phone]

## 2016-06-08 ENCOUNTER — Ambulatory Visit (INDEPENDENT_AMBULATORY_CARE_PROVIDER_SITE_OTHER): Payer: Medicare Other | Admitting: Cardiology

## 2016-06-08 ENCOUNTER — Encounter: Payer: Self-pay | Admitting: Cardiology

## 2016-06-08 VITALS — BP 128/72 | HR 83 | Ht 70.0 in | Wt 231.0 lb

## 2016-06-08 DIAGNOSIS — D649 Anemia, unspecified: Secondary | ICD-10-CM | POA: Diagnosis not present

## 2016-06-08 DIAGNOSIS — Z951 Presence of aortocoronary bypass graft: Secondary | ICD-10-CM | POA: Diagnosis not present

## 2016-06-08 DIAGNOSIS — I213 ST elevation (STEMI) myocardial infarction of unspecified site: Secondary | ICD-10-CM | POA: Diagnosis not present

## 2016-06-08 DIAGNOSIS — Z9189 Other specified personal risk factors, not elsewhere classified: Secondary | ICD-10-CM

## 2016-06-08 DIAGNOSIS — I5020 Unspecified systolic (congestive) heart failure: Secondary | ICD-10-CM

## 2016-06-08 DIAGNOSIS — Z79899 Other long term (current) drug therapy: Secondary | ICD-10-CM | POA: Diagnosis not present

## 2016-06-08 DIAGNOSIS — I2111 ST elevation (STEMI) myocardial infarction involving right coronary artery: Secondary | ICD-10-CM

## 2016-06-08 LAB — BASIC METABOLIC PANEL
BUN: 15 mg/dL (ref 7–25)
CALCIUM: 9.3 mg/dL (ref 8.6–10.3)
CHLORIDE: 102 mmol/L (ref 98–110)
CO2: 26 mmol/L (ref 20–31)
Creat: 1.02 mg/dL (ref 0.70–1.25)
GLUCOSE: 97 mg/dL (ref 65–99)
POTASSIUM: 4.5 mmol/L (ref 3.5–5.3)
SODIUM: 140 mmol/L (ref 135–146)

## 2016-06-08 LAB — CBC
HEMATOCRIT: 33.9 % — AB (ref 38.5–50.0)
HEMOGLOBIN: 11.2 g/dL — AB (ref 13.2–17.1)
MCH: 28.6 pg (ref 27.0–33.0)
MCHC: 33 g/dL (ref 32.0–36.0)
MCV: 86.7 fL (ref 80.0–100.0)
MPV: 8.5 fL (ref 7.5–12.5)
PLATELETS: 282 10*3/uL (ref 140–400)
RBC: 3.91 MIL/uL — AB (ref 4.20–5.80)
RDW: 14 % (ref 11.0–15.0)
WBC: 7.1 10*3/uL (ref 3.8–10.8)

## 2016-06-08 MED ORDER — FUROSEMIDE 40 MG PO TABS: 40.0000 mg | ORAL_TABLET | Freq: Every day | ORAL | 6 refills | Status: DC

## 2016-06-08 NOTE — Patient Instructions (Signed)
Your physician recommends that you have lab work today.  Your physician has recommended you make the following change in your medication: a prescription for furosemide has been sent to your [facility].   Your physician recommends that you schedule a follow-up appointment in: 4-6 weeks with Dr Tamala Julian.

## 2016-06-09 ENCOUNTER — Telehealth: Payer: Self-pay

## 2016-06-09 MED ORDER — RIVAROXABAN 20 MG PO TABS: 20.0000 mg | ORAL_TABLET | Freq: Every day | ORAL | 2 refills | Status: AC

## 2016-06-09 NOTE — Telephone Encounter (Signed)
Pt wife aware that Cecilie Kicks, NP has talked with Dr.[facility]igt who recommends that the pt resume Xarelto 20mg  daily. We have sent an Rx to the pt local pharmacy. Pt wife ask if we could sign off on a form  temp handicap. Adv her that Mickel Baas has agreed to sign off. I will leave the completed form at the front desk for pick.  Mrs. Kosier verbalized understanding and voiced appreciation for the call.

## 2016-06-20 DIAGNOSIS — I2510 Atherosclerotic heart disease of native coronary artery without angina pectoris: Secondary | ICD-10-CM | POA: Diagnosis not present

## 2016-06-20 DIAGNOSIS — Z86711 Personal history of pulmonary embolism: Secondary | ICD-10-CM | POA: Diagnosis not present

## 2016-06-20 DIAGNOSIS — N529 Male erectile dysfunction, unspecified: Secondary | ICD-10-CM | POA: Diagnosis not present

## 2016-06-20 DIAGNOSIS — F411 Generalized anxiety disorder: Secondary | ICD-10-CM | POA: Diagnosis not present

## 2016-06-20 DIAGNOSIS — Z0000 Encounter for general adult medical examination without abnormal findings: Secondary | ICD-10-CM | POA: Diagnosis not present

## 2016-06-20 DIAGNOSIS — E7800 Pure hypercholesterolemia, unspecified: Secondary | ICD-10-CM | POA: Diagnosis not present

## 2016-06-20 DIAGNOSIS — M109 Gout, unspecified: Secondary | ICD-10-CM | POA: Diagnosis not present

## 2016-06-20 DIAGNOSIS — R7301 Impaired fasting glucose: Secondary | ICD-10-CM | POA: Diagnosis not present

## 2016-06-20 DIAGNOSIS — J301 Allergic rhinitis due to pollen: Secondary | ICD-10-CM | POA: Diagnosis not present

## 2016-06-20 DIAGNOSIS — Z23 Encounter for immunization: Secondary | ICD-10-CM | POA: Diagnosis not present

## 2016-06-20 DIAGNOSIS — G4733 Obstructive sleep apnea (adult) (pediatric): Secondary | ICD-10-CM | POA: Diagnosis not present

## 2016-06-20 DIAGNOSIS — Z125 Encounter for screening for malignant neoplasm of prostate: Secondary | ICD-10-CM | POA: Diagnosis not present

## 2016-06-21 ENCOUNTER — Encounter: Payer: No Typology Code available for payment source | Admitting: Cardiothoracic Surgery

## 2016-06-21 ENCOUNTER — Other Ambulatory Visit: Payer: Self-pay | Admitting: Cardiothoracic Surgery

## 2016-06-21 DIAGNOSIS — Z951 Presence of aortocoronary bypass graft: Secondary | ICD-10-CM

## 2016-06-23 ENCOUNTER — Ambulatory Visit (INDEPENDENT_AMBULATORY_CARE_PROVIDER_SITE_OTHER): Payer: Self-pay | Admitting: Cardiothoracic Surgery

## 2016-06-23 ENCOUNTER — Ambulatory Visit
Admission: RE | Admit: 2016-06-23 | Discharge: 2016-06-23 | Disposition: A | Discharge status: Home / Self Care | Payer: Medicare Other | Source: Ambulatory Visit | Attending: Cardiothoracic Surgery | Admitting: Cardiothoracic Surgery

## 2016-06-23 ENCOUNTER — Encounter: Payer: Self-pay | Admitting: Cardiothoracic Surgery

## 2016-06-23 VITALS — BP 125/71 | HR 45 | Resp 16 | Ht 70.0 in | Wt 228.4 lb

## 2016-06-23 DIAGNOSIS — I71019 Dissection of thoracic aorta, unspecified: Secondary | ICD-10-CM

## 2016-06-23 DIAGNOSIS — I7101 Dissection of thoracic aorta: Secondary | ICD-10-CM

## 2016-06-23 DIAGNOSIS — Z9889 Other specified postprocedural states: Secondary | ICD-10-CM

## 2016-06-23 DIAGNOSIS — Z951 Presence of aortocoronary bypass graft: Secondary | ICD-10-CM

## 2016-06-23 DIAGNOSIS — I2510 Atherosclerotic heart disease of native coronary artery without angina pectoris: Secondary | ICD-10-CM

## 2016-06-23 NOTE — Progress Notes (Signed)
PCP is Mayra Neer, MD Referring Provider is Belva Crome, MD  Chief Complaint  Patient presents with  . Routine Post Op    f/u with CXR s/p CABG X 2/REPAIR OF AORTIC DISSECTION 05/14/16    HPI: The patient presents for follow-up 1 month after emergency CABG 2 with combined repair of ascending aortic type A aortic dissection with a Hemashield straight graft and resuspension of the aortic valve. The patient presented with an occluded RCA and DMI. He did well after surgery and a postop echo showed ejection fraction stable at 50%.  The patient has progressing well at home. No symptoms of recurrent angina or CHF. He complains of poor sleeping pattern and lack of appetite. He was evaluated at the cardiology office by Serita Butcher NP And was placed on Lasix for some mild ankle edema. The patient has some residual numbness and mild weakness in his right hand following right axillary artery cannulation for bypass.  Chest x-ray today is clear   Past Medical History:  Diagnosis Date  . DJD (degenerative joint disease), lumbar   . DVT (deep venous thrombosis) ([place])   . Glaucoma    R eye diminished vision approx 50%  . High cholesterol   . Hyperlipidemia   . Hypertension   . IFG (impaired fasting glucose)   . OSA (obstructive sleep apnea)    Severe w AHI 34/hr on HST no on BiPAP at 19/15cm H2O  . PE (pulmonary embolism)    Second occurance,enknown etiology,lifelong anticoagulation    Past Surgical History:  Procedure Laterality Date  . CARDIAC CATHETERIZATION N/A 05/14/2016   Procedure: Left Heart Cath and Coronary Angiography;  Surgeon: Belva Crome, MD;  Location: [facility];  Service: Cardiovascular;  Laterality: N/A;  . CORONARY ARTERY BYPASS GRAFT N/A 05/14/2016   Procedure: CORONARY ARTERY BYPASS GRAFTING (CABG) times two with LIMA to LAD and right leg SVG to PDA,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;  Surgeon: Ivin Poot, MD;  Location: [facility];  Service:  Open Heart Surgery;  Laterality: N/A;    Family History  Problem Relation Age of Onset  . Osteoporosis Mother   . Hypertension Father   . CAD Father     Social History Social History  Substance Use Topics  . Smoking status: Never Smoker  . Smokeless tobacco: Never Used  . Alcohol use Yes     Comment: 1-2 per week    Current Outpatient Prescriptions  Medication Sig Dispense Refill  . allopurinol (ZYLOPRIM) 100 MG tablet Take 100 mg by mouth daily.    Marland Kitchen aspirin EC 325 MG EC tablet Take 1 tablet (325 mg total) by mouth daily.    . folic acid (FOLVITE) 1 MG tablet Take 1 tablet (1 mg total) by mouth daily. 30 tablet 1  . furosemide (LASIX) 40 MG tablet Take 1 tablet (40 mg total) by mouth daily. 30 tablet 6  . gabapentin (NEURONTIN) 100 MG capsule Take 100 mg by mouth 2 (two) times daily.    . hydroxychloroquine (PLAQUENIL) 200 MG tablet Take 200 mg by mouth daily. Take with food or milk     . metoprolol tartrate (LOPRESSOR) 25 MG tablet Take 0.5 tablets (12.5 mg total) by mouth 2 (two) times daily. 30 tablet 1  . Multiple Vitamins-Minerals (OCUVITE EYE HEALTH FORMULA PO) Take 1 tablet by mouth daily.    . potassium chloride SA (K-DUR,KLOR-CON) 20 MEQ tablet Take 1 tablet (20 mEq total) by mouth daily. 30 tablet 0  .  rivaroxaban (XARELTO) 20 MG TABS tablet Take 1 tablet (20 mg total) by mouth daily with supper. 30 tablet 2  . sertraline (ZOLOFT) 50 MG tablet Take 50 mg by mouth daily. TAKE 1/2 TAB DAILY THE FIRST WEEK, THEN 1 TAB DAILY THE SECOND WEEK AND CONTIUNE    . simvastatin (ZOCOR) 20 MG tablet Take 20 mg by mouth daily.     No current facility-administered medications for this visit.     Allergies  Allergen Reactions  . Penicillins Anaphylaxis    Has patient had a PCN reaction causing immediate rash, facial/tongue/throat swelling, SOB or lightheadedness with hypotension: Yes Has patient had a PCN reaction causing severe rash involving mucus membranes or skin necrosis:  No Has patient had a PCN reaction that required hospitalization Yes Has patient had a PCN reaction occurring within the last 10 years: No If all of the above answers are "NO", then may proceed with Cephalosporin use.     Review of Systems  Improved strength and exercise tolerance No orthopnea, no cough, no sternal clicking Mild weakness and numbness of right hand Ankle edema improved on oral Lasix 40 mg daily  BP 125/71   Pulse (!) 45   Resp 16   Ht 5\' 10"  (1.778 m)   Wt 228 lb 6.4 oz (103.6 kg)   BMI 32.77 kg/m  Physical Exam Alert and comfortable Lungs clear Sternum stable well-healed Heart rate with PACs, no murmur no gallop Leg incision well-healed minimal ankle edema Neuro intact  Diagnostic Tests: Chest x-ray clear, stable cardiac silhouette, sternal wires intact  Impression: Doing well one month after emergency CABG 2 with replacement of a dissected ascending aorta with hema- shield straight graft  Plan:Continue current medications The patient will be referred to outpatient cardiac rehabilitation Patient may start driving and may lift up to 20 pounds maximum Patient was given a prescription for tramadol for muscular skeletal pain. He'll return in 4 weeks for review of progress    Len Childs, MD [facility] [phone]

## 2016-07-12 ENCOUNTER — Ambulatory Visit (INDEPENDENT_AMBULATORY_CARE_PROVIDER_SITE_OTHER): Payer: Medicare Other | Admitting: Interventional Cardiology

## 2016-07-12 ENCOUNTER — Encounter: Payer: Self-pay | Admitting: Interventional Cardiology

## 2016-07-12 VITALS — BP 110/72 | HR 61 | Ht 70.0 in | Wt 228.8 lb

## 2016-07-12 DIAGNOSIS — Z95828 Presence of other vascular implants and grafts: Secondary | ICD-10-CM | POA: Diagnosis not present

## 2016-07-12 DIAGNOSIS — I2111 ST elevation (STEMI) myocardial infarction involving right coronary artery: Secondary | ICD-10-CM

## 2016-07-12 DIAGNOSIS — Z951 Presence of aortocoronary bypass graft: Secondary | ICD-10-CM | POA: Diagnosis not present

## 2016-07-12 DIAGNOSIS — I252 Old myocardial infarction: Secondary | ICD-10-CM | POA: Diagnosis not present

## 2016-07-12 DIAGNOSIS — I2699 Other pulmonary embolism without acute cor pulmonale: Secondary | ICD-10-CM | POA: Insufficient documentation

## 2016-07-12 DIAGNOSIS — G4733 Obstructive sleep apnea (adult) (pediatric): Secondary | ICD-10-CM

## 2016-07-12 NOTE — Progress Notes (Signed)
Cardiology Office Note    Date:  07/12/2016   ID:  [name], [name] 1950-11-25, MRN [id number]  PCP:  Mayra Neer, MD  Cardiologist: Sinclair Grooms, MD   Chief Complaint  Patient presents with  . Coronary Artery Disease    History of Present Illness:  Patrick Fritz is a 65 y.o. male follow-up of CAD requiring emergency CABG following 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 Surgery required included bypass grafting and replacement of the ascending aorta by Dr. PVT. Patient is done well with the exception of numbness in the right arm and hand. Prior history of DVT and bilateral pulmonary emboli. Also history of obstructive sleep apnea.  This [name] looks great. He is ambulatory. He denies chest discomfort and dyspnea. Activity is increasing without significant limitation. He sees Dr. PVT and proximally one month. He is back to driving his car. There is no sternal soreness.   Past Medical History:  Diagnosis Date  . DJD (degenerative joint disease), lumbar   . DVT (deep venous thrombosis) ([facility])   . Glaucoma    R eye diminished vision approx 50%  . High cholesterol   . Hyperlipidemia   . Hypertension   . IFG (impaired fasting glucose)   . OSA (obstructive sleep apnea)    Severe w AHI 34/hr on HST no on BiPAP at 19/15cm H2O  . PE (pulmonary embolism)    Second occurance,enknown etiology,lifelong anticoagulation    Past Surgical History:  Procedure Laterality Date  . CARDIAC CATHETERIZATION N/A 05/14/2016   Procedure: Left Heart Cath and Coronary Angiography;  Surgeon: Belva Crome, MD;  Location: [facility];  Service: Cardiovascular;  Laterality: N/A;  . CORONARY ARTERY BYPASS GRAFT N/A 05/14/2016   Procedure: CORONARY ARTERY BYPASS GRAFTING (CABG) times two with LIMA to LAD and right leg SVG to PDA,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;  Surgeon: Ivin Poot, MD;  Location: [facility];  Service: Open Heart Surgery;   Laterality: N/A;    Current Medications: Outpatient Medications Prior to Visit  Medication Sig Dispense Refill  . allopurinol (ZYLOPRIM) 100 MG tablet Take 100 mg by mouth daily.    . folic acid (FOLVITE) 1 MG tablet Take 1 tablet (1 mg total) by mouth daily. 30 tablet 1  . gabapentin (NEURONTIN) 100 MG capsule Take 100 mg by mouth 2 (two) times daily.    . hydroxychloroquine (PLAQUENIL) 200 MG tablet Take 200 mg by mouth daily. Take with food or milk     . metoprolol tartrate (LOPRESSOR) 25 MG tablet Take 0.5 tablets (12.5 mg total) by mouth 2 (two) times daily. 30 tablet 1  . Multiple Vitamins-Minerals (OCUVITE EYE HEALTH FORMULA PO) Take 1 tablet by mouth daily.    . potassium chloride SA (K-DUR,KLOR-CON) 20 MEQ tablet Take 1 tablet (20 mEq total) by mouth daily. 30 tablet 0  . rivaroxaban (XARELTO) 20 MG TABS tablet Take 1 tablet (20 mg total) by mouth daily with supper. 30 tablet 2  . simvastatin (ZOCOR) 20 MG tablet Take 20 mg by mouth daily.    . furosemide (LASIX) 40 MG tablet Take 1 tablet (40 mg total) by mouth daily. 30 tablet 6  . aspirin EC 325 MG EC tablet Take 1 tablet (325 mg total) by mouth daily.    . sertraline (ZOLOFT) 50 MG tablet Take 50 mg by mouth daily. TAKE 1/2 TAB DAILY THE FIRST WEEK, THEN 1 TAB DAILY THE SECOND WEEK  AND CONTIUNE     No facility-administered medications prior to visit.      Allergies:   Penicillins   Social History   Social History  . Marital status: Married    Spouse name: N/A  . Number of children: N/A  . Years of education: N/A   Social History Main Topics  . Smoking status: Never Smoker  . Smokeless tobacco: Never Used  . Alcohol use Yes     Comment: 1-2 per week  . Drug use: Unknown  . Sexual activity: Not Asked   Other Topics Concern  . None   Social History Narrative  . None     Family History:  The patient's family history includes CAD in his father; Hypertension in his father; Osteoporosis in his mother.   ROS:     Please see the history of present illness.    Right greater than left lower extremity swelling, back pain, dizziness, right hand weakness, gradually improving. The patient is left-handed.  All other systems reviewed and are negative.   PHYSICAL EXAM:   VS:  BP 110/72   Pulse 61   Ht 5\' 10"  (1.778 m)   Wt 228 lb 12.8 oz (103.8 kg)   BMI 32.83 kg/m    GEN: Well nourished, well developed, in no acute distress  HEENT: normal  Neck: no JVD, carotid bruits, or masses Cardiac: RRR; no murmurs, rubs, or gallops,no edema  Respiratory:  clear to auscultation bilaterally, normal work of breathing GI: soft, nontender, nondistended, + BS MS: no deformity or atrophy  Skin: warm and dry, no rash Neuro:  Alert and Oriented x 3, Strength and sensation are intact Psych: euthymic mood, full affect  Wt Readings from Last 3 Encounters:  07/12/16 228 lb 12.8 oz (103.8 kg)  06/23/16 228 lb 6.4 oz (103.6 kg)  06/08/16 231 lb (104.8 kg)      Studies/Labs Reviewed:   EKG:  EKG  Not repeated  Recent Labs: 05/16/2016: Magnesium 1.9 05/20/2016: ALT 39 06/08/2016: BUN 15; Creat 1.02; Hemoglobin 11.2; Platelets 282; Potassium 4.5; Sodium 140   Lipid Panel    Component Value Date/Time   CHOL 179 05/14/2016 1310   TRIG 165 (H) 05/14/2016 1310   HDL 45 05/14/2016 1310   CHOLHDL 4.0 05/14/2016 1310   VLDL 33 05/14/2016 1310   LDLCALC 101 (H) 05/14/2016 1310    Additional studies/ records that were reviewed today include:  Emergency CABG 05/14/16 with LIMA to the left anterior descending and SVG to the distal posterior descending; emergency repair of type a ascending aortic dissection with 26 mm he may she'll straight graft of the ascending aorta. Right axillary cannulation for cardiopulmonary bypass and antegrade cerebral perfusion.    ASSESSMENT:    1. S/P CABG x 2   2. Hx of ascending aorta replacement   3. Morbid obesity due to excess calories ([facility])   4. History of ST elevation myocardial  infarction (STEMI)   5. Bilateral pulmonary embolism ([facility])   6. Obstructive sleep apnea      PLAN:  In order of problems listed above:  1. Presentation with acute inferior infarction and cath procedure comprehensive by right coronary spiral dissection with extension into the ascending aorta. Patient was taken emergently by Dr. PVT and had root replacement and double vessel coronary bypass. He had right axillary cannulation for cerebral antegrade perfusion. He complains of right hand numbness and stiffness but it is gradually improving. Overall he is doing well from the standpoint of his  surgery and has had no recurrent ischemic symptoms. 2. Stable without aortic regurgitation or evidence of valve dysfunction. 3. Not addressed 4. Will reassess LV function in the future. Post surgical echo done 5 days after revascularization revealed an EF of 45-50%. 5. He is on Xarelto along with aspirin 81 mg per day. Xarelto has been continued since the patient's DVT and bilateral pulmonary emboli that occurred greater than 10 years ago. I will research his right ear records to determine if anticoagulation therapy should be lifelong. We will discuss this on his return visit in several months. For the time being and we will continue Xarelto.    Medication Adjustments/Labs and Tests Ordered: Current medicines are reviewed at length with the patient today.  Concerns regarding medicines are outlined above.  Medication changes, Labs and Tests ordered today are listed in the Patient Instructions below. Patient Instructions  Medication Instructions:  Your physician recommends that you continue on your current medications as directed. Please refer to the Current Medication list given to you today.   Labwork: None ordered  Testing/Procedures: None ordered  Follow-Up: Your physician wants you to follow-up in: Jan/Feb 2018 with Dr.Mckinzie Saksa You will receive a reminder letter in the mail two months in advance. If you  don't receive a letter, please call our office to schedule the follow-up appointment.   Any Other Special Instructions Will Be Listed Below (If Applicable).     If you need a refill on your cardiac medications before your next appointment, please call your pharmacy.      Signed, Sinclair Grooms, MD  07/12/2016 12:38 PM    [facility] [place], [place], [place]  [address] Phone: [phone]; Fax: [phone]

## 2016-07-12 NOTE — Patient Instructions (Signed)
Medication Instructions:  Your physician recommends that you continue on your current medications as directed. Please refer to the Current Medication list given to you today.   Labwork: None ordered  Testing/Procedures: None ordered  Follow-Up: Your physician wants you to follow-up in: Jan/Feb 2018 with Dr.Smith You will receive a reminder letter in the mail two months in advance. If you don't receive a letter, please call our office to schedule the follow-up appointment.   Any Other Special Instructions Will Be Listed Below (If Applicable).     If you need a refill on your cardiac medications before your next appointment, please call your pharmacy.

## 2016-07-26 ENCOUNTER — Ambulatory Visit (INDEPENDENT_AMBULATORY_CARE_PROVIDER_SITE_OTHER): Payer: Self-pay | Admitting: Cardiothoracic Surgery

## 2016-07-26 ENCOUNTER — Encounter: Payer: Self-pay | Admitting: Cardiothoracic Surgery

## 2016-07-26 VITALS — BP 107/71 | HR 74 | Resp 16 | Ht 70.0 in | Wt 228.0 lb

## 2016-07-26 DIAGNOSIS — I71019 Dissection of thoracic aorta, unspecified: Secondary | ICD-10-CM

## 2016-07-26 DIAGNOSIS — I2510 Atherosclerotic heart disease of native coronary artery without angina pectoris: Secondary | ICD-10-CM

## 2016-07-26 DIAGNOSIS — Z9889 Other specified postprocedural states: Secondary | ICD-10-CM

## 2016-07-26 DIAGNOSIS — Z951 Presence of aortocoronary bypass graft: Secondary | ICD-10-CM

## 2016-07-26 DIAGNOSIS — I7101 Dissection of thoracic aorta: Secondary | ICD-10-CM

## 2016-07-26 NOTE — Progress Notes (Signed)
PCP is Mayra Neer, MD Referring Provider is Belva Crome, MD  Chief Complaint  Patient presents with  . Routine Post Op    1 month f/u...reassess right arm and hand numbness    HPI:A 2 month postop visit after emergency CABG 2 and replacement of the ascending aorta with a  26 mm Hemashield  graft. Patient's ejection fraction is 50%. He is maintained sinus rhythm. He has no symptoms of recurrent angina or CHF. Surgical incisions are all well-healed. 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 He is taking 81 mg aspirin. He is on chronic Xarelto  For history of DVT and PE in 2006.  Overall he is significantly improving his strength and exercise tolerance.  Numbness in his right hand is significantly improved and almost resolved.  Past Medical History:  Diagnosis Date  . DJD (degenerative joint disease), lumbar   . DVT (deep venous thrombosis) ([place])   . Glaucoma    R eye diminished vision approx 50%  . High cholesterol   . Hyperlipidemia   . Hypertension   . IFG (impaired fasting glucose)   . OSA (obstructive sleep apnea)    Severe w AHI 34/hr on HST no on BiPAP at 19/15cm H2O  . PE (pulmonary embolism)    Second occurance,enknown etiology,lifelong anticoagulation    Past Surgical History:  Procedure Laterality Date  . CARDIAC CATHETERIZATION N/A 05/14/2016   Procedure: Left Heart Cath and Coronary Angiography;  Surgeon: Belva Crome, MD;  Location: [facility];  Service: Cardiovascular;  Laterality: N/A;  . CORONARY ARTERY BYPASS GRAFT N/A 05/14/2016   Procedure: CORONARY ARTERY BYPASS GRAFTING (CABG) times two with LIMA to LAD and right leg SVG to PDA,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;  Surgeon: Ivin Poot, MD;  Location: [facility];  Service: Open Heart Surgery;  Laterality: N/A;    Family History  Problem Relation Age of Onset  .  Osteoporosis Mother   . Hypertension Father   . CAD Father     Social History Social History  Substance Use Topics  . Smoking status: Never Smoker  . Smokeless tobacco: Never Used  . Alcohol use Yes     Comment: 1-2 per week    Current Outpatient Prescriptions  Medication Sig Dispense Refill  . allopurinol (ZYLOPRIM) 100 MG tablet Take 100 mg by mouth daily.    Marland Kitchen aspirin 81 MG tablet Take 81 mg by mouth daily.    . folic acid (FOLVITE) 1 MG tablet Take 1 tablet (1 mg total) by mouth daily. 30 tablet 1  . furosemide (LASIX) 40 MG tablet Take 1 tablet (40 mg total) by mouth daily. 30 tablet 6  . gabapentin (NEURONTIN) 100 MG capsule Take 100 mg by mouth 2 (two) times daily.    . hydroxychloroquine (PLAQUENIL) 200 MG tablet Take 200 mg by mouth daily. Take with food or milk     . metoprolol tartrate (LOPRESSOR) 25 MG tablet Take 0.5 tablets (12.5 mg total) by mouth 2 (two) times daily. 30 tablet 1  . Multiple Vitamins-Minerals (OCUVITE EYE HEALTH FORMULA PO) Take 1 tablet by mouth daily.    . rivaroxaban (XARELTO) 20 MG TABS tablet Take 1 tablet (20 mg total) by mouth daily with supper. 30 tablet 2  . sertraline (ZOLOFT) 50 MG tablet Take 50 mg by mouth daily.    . simvastatin (ZOCOR) 20 MG  tablet Take 20 mg by mouth daily.    . traMADol (ULTRAM) 50 MG tablet Take one to two (50 mg to 100 mg total) by mouth every six (6) hours as needed for pain.    . potassium chloride SA (K-DUR,KLOR-CON) 20 MEQ tablet Take 1 tablet (20 mEq total) by mouth daily. 30 tablet 0   No current facility-administered medications for this visit.     Allergies  Allergen Reactions  . Penicillins Anaphylaxis    Has patient had a PCN reaction causing immediate rash, facial/tongue/throat swelling, SOB or lightheadedness with hypotension: Yes Has patient had a PCN reaction causing severe rash involving mucus membranes or skin necrosis: No Has patient had a PCN reaction that required hospitalization Yes Has  patient had a PCN reaction occurring within the last 10 years: No If all of the above answers are "NO", then may proceed with Cephalosporin use.     Review of Systems   No sternal clicking No fever Gradually returning to his preop weight No edema No bleeding complications from his xarelto  BP 107/71   Pulse 74   Resp 16   Ht 5\' 10"  (1.778 m)   Wt 228 lb (103.4 kg)   SpO2 98% Comment: ON RA  BMI 32.71 kg/m  Physical Exam      Exam    General- alert and comfortable   Lungs- clear without rales, wheezes   Cor- regular rate and rhythm, no murmur , gallop   Abdomen- soft, non-tender   Extremities - warm, non-tender, minimal edema   Neuro- oriented, appropriate, no focal weakness   Diagnostic Tests:  no chest x-ray today   Impression:  excellent early recovery after emergency CABG 2 with replacement ascending aorta for acute MI with coronary and aortic dissection.   Plan:Patient will be off all restrictions for lifting after mid-November. I'll see him back in 6 months postop with a CTA of his thoracic aorta to evaluate the repair. He understands that he should remain on aspirin daily indefinitely.    Len Childs, MD [facility] [phone]

## 2016-08-01 DIAGNOSIS — M549 Dorsalgia, unspecified: Secondary | ICD-10-CM | POA: Diagnosis not present

## 2016-08-01 DIAGNOSIS — F411 Generalized anxiety disorder: Secondary | ICD-10-CM | POA: Diagnosis not present

## 2016-09-18 DIAGNOSIS — E790 Hyperuricemia without signs of inflammatory arthritis and tophaceous disease: Secondary | ICD-10-CM | POA: Diagnosis not present

## 2016-09-18 DIAGNOSIS — Z79899 Other long term (current) drug therapy: Secondary | ICD-10-CM | POA: Diagnosis not present

## 2016-09-18 DIAGNOSIS — M064 Inflammatory polyarthropathy: Secondary | ICD-10-CM | POA: Diagnosis not present

## 2016-09-18 DIAGNOSIS — M19042 Primary osteoarthritis, left hand: Secondary | ICD-10-CM | POA: Diagnosis not present

## 2016-09-18 DIAGNOSIS — M79673 Pain in unspecified foot: Secondary | ICD-10-CM | POA: Diagnosis not present

## 2016-09-18 DIAGNOSIS — M79643 Pain in unspecified hand: Secondary | ICD-10-CM | POA: Diagnosis not present

## 2016-09-18 DIAGNOSIS — M130 Polyarthritis, unspecified: Secondary | ICD-10-CM | POA: Diagnosis not present

## 2016-09-18 DIAGNOSIS — M47816 Spondylosis without myelopathy or radiculopathy, lumbar region: Secondary | ICD-10-CM | POA: Diagnosis not present

## 2016-09-18 DIAGNOSIS — M19041 Primary osteoarthritis, right hand: Secondary | ICD-10-CM | POA: Diagnosis not present

## 2016-09-27 DIAGNOSIS — M545 Low back pain: Secondary | ICD-10-CM | POA: Diagnosis not present

## 2016-09-27 DIAGNOSIS — G8929 Other chronic pain: Secondary | ICD-10-CM | POA: Diagnosis not present

## 2016-09-27 DIAGNOSIS — M47816 Spondylosis without myelopathy or radiculopathy, lumbar region: Secondary | ICD-10-CM | POA: Diagnosis not present

## 2016-09-28 DIAGNOSIS — M47816 Spondylosis without myelopathy or radiculopathy, lumbar region: Secondary | ICD-10-CM | POA: Diagnosis not present

## 2016-09-28 DIAGNOSIS — M5136 Other intervertebral disc degeneration, lumbar region: Secondary | ICD-10-CM | POA: Diagnosis not present

## 2016-10-04 DIAGNOSIS — M5136 Other intervertebral disc degeneration, lumbar region: Secondary | ICD-10-CM | POA: Diagnosis not present

## 2016-10-18 DIAGNOSIS — M5136 Other intervertebral disc degeneration, lumbar region: Secondary | ICD-10-CM | POA: Diagnosis not present

## 2016-10-20 DIAGNOSIS — M5136 Other intervertebral disc degeneration, lumbar region: Secondary | ICD-10-CM | POA: Diagnosis not present

## 2016-10-23 DIAGNOSIS — M5136 Other intervertebral disc degeneration, lumbar region: Secondary | ICD-10-CM | POA: Diagnosis not present

## 2016-10-24 DIAGNOSIS — M5136 Other intervertebral disc degeneration, lumbar region: Secondary | ICD-10-CM | POA: Diagnosis not present

## 2016-10-26 DIAGNOSIS — M5136 Other intervertebral disc degeneration, lumbar region: Secondary | ICD-10-CM | POA: Diagnosis not present

## 2016-11-13 ENCOUNTER — Other Ambulatory Visit: Payer: Self-pay | Admitting: *Deleted

## 2016-11-13 DIAGNOSIS — Z8679 Personal history of other diseases of the circulatory system: Secondary | ICD-10-CM

## 2016-11-13 DIAGNOSIS — Z9889 Other specified postprocedural states: Principal | ICD-10-CM

## 2016-11-28 DIAGNOSIS — I2510 Atherosclerotic heart disease of native coronary artery without angina pectoris: Secondary | ICD-10-CM | POA: Diagnosis not present

## 2016-11-28 DIAGNOSIS — F411 Generalized anxiety disorder: Secondary | ICD-10-CM | POA: Diagnosis not present

## 2016-11-28 DIAGNOSIS — E669 Obesity, unspecified: Secondary | ICD-10-CM | POA: Diagnosis not present

## 2016-11-28 DIAGNOSIS — R7301 Impaired fasting glucose: Secondary | ICD-10-CM | POA: Diagnosis not present

## 2016-11-28 DIAGNOSIS — E7800 Pure hypercholesterolemia, unspecified: Secondary | ICD-10-CM | POA: Diagnosis not present

## 2016-11-30 ENCOUNTER — Encounter (HOSPITAL_COMMUNITY): Payer: Self-pay | Admitting: Family Medicine

## 2016-11-30 NOTE — Progress Notes (Signed)
Mailed letter with Cardiac Rehab Program to pt ... KJ  °

## 2016-12-06 ENCOUNTER — Ambulatory Visit
Admission: RE | Admit: 2016-12-06 | Discharge: 2016-12-06 | Disposition: A | Discharge status: Home / Self Care | Payer: Medicare Other | Source: Ambulatory Visit | Attending: Cardiothoracic Surgery | Admitting: Cardiothoracic Surgery

## 2016-12-06 ENCOUNTER — Ambulatory Visit: Payer: Medicare Other | Admitting: Cardiothoracic Surgery

## 2016-12-06 DIAGNOSIS — Z8679 Personal history of other diseases of the circulatory system: Secondary | ICD-10-CM

## 2016-12-06 DIAGNOSIS — Z9889 Other specified postprocedural states: Principal | ICD-10-CM

## 2016-12-06 DIAGNOSIS — I712 Thoracic aortic aneurysm, without rupture: Secondary | ICD-10-CM | POA: Diagnosis not present

## 2016-12-12 ENCOUNTER — Telehealth (HOSPITAL_COMMUNITY): Payer: Self-pay | Admitting: Family Medicine

## 2016-12-12 ENCOUNTER — Telehealth (HOSPITAL_COMMUNITY): Payer: Self-pay | Admitting: *Deleted

## 2016-12-12 NOTE — Telephone Encounter (Signed)
Returning call to pt wife from message left on yesterday.  Provided contact information.  Await return call. Cherre Huger, BSN

## 2016-12-12 NOTE — Telephone Encounter (Signed)
Verified patient insurance. Patient has Medicare and [employer]-supplement. [employer] follow Medicare guidelines: no-payment, no deductible, 0% co-insurance, no out of pocket. No pre-authorization and no limits on visits. Passport/reference # [id number]. Information given to [facility] for review.

## 2016-12-13 ENCOUNTER — Ambulatory Visit (INDEPENDENT_AMBULATORY_CARE_PROVIDER_SITE_OTHER): Payer: Medicare Other | Admitting: Cardiothoracic Surgery

## 2016-12-13 ENCOUNTER — Encounter: Payer: Self-pay | Admitting: Cardiothoracic Surgery

## 2016-12-13 VITALS — BP 105/61 | HR 76 | Resp 20 | Ht 70.0 in | Wt 241.0 lb

## 2016-12-13 DIAGNOSIS — Z9889 Other specified postprocedural states: Secondary | ICD-10-CM

## 2016-12-13 DIAGNOSIS — Z951 Presence of aortocoronary bypass graft: Secondary | ICD-10-CM

## 2016-12-13 DIAGNOSIS — I71019 Dissection of thoracic aorta, unspecified: Secondary | ICD-10-CM

## 2016-12-13 DIAGNOSIS — I7101 Dissection of thoracic aorta: Secondary | ICD-10-CM | POA: Diagnosis not present

## 2016-12-13 NOTE — Progress Notes (Signed)
PCP is Mayra Neer, MD Referring Provider is Belva Crome, MD  Chief Complaint  Patient presents with  . Thoracic Aortic Dissection    4 month f/u with CTA Chest 12/06/16, HX of  dissection repair and CABG    HPI: Patient returns for 6 month follow up with CTA of the thoracic aorta after emergency CABG with combined replacement of ascending aorta for dissection of the aortic root. The aortic valve was left intact. The patient is been doing well without recurrent angina. Surgical incision is well-healed. He is remained in sinus rhythm.  CT scan today shows intact aortic graft from the sinotubular junction to the proximal arch. No evidence of false aneurysm or stenosis. Normal arch anatomy. Past Medical History:  Diagnosis Date  . DJD (degenerative joint disease), lumbar   . DVT (deep venous thrombosis) ([place])   . Glaucoma    R eye diminished vision approx 50%  . High cholesterol   . Hyperlipidemia   . Hypertension   . IFG (impaired fasting glucose)   . OSA (obstructive sleep apnea)    Severe w AHI 34/hr on HST no on BiPAP at 19/15cm H2O  . PE (pulmonary embolism)    Second occurance,enknown etiology,lifelong anticoagulation    Past Surgical History:  Procedure Laterality Date  . CARDIAC CATHETERIZATION N/A 05/14/2016   Procedure: Left Heart Cath and Coronary Angiography;  Surgeon: Belva Crome, MD;  Location: [facility] [place] [facility];  Service: Cardiovascular;  Laterality: N/A;  . CORONARY ARTERY BYPASS GRAFT N/A 05/14/2016   Procedure: CORONARY ARTERY BYPASS GRAFTING (CABG) times two with LIMA to LAD and right leg SVG to PDA,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;  Surgeon: Ivin Poot, MD;  Location: [facility];  Service: Open Heart Surgery;  Laterality: N/A;    Family History  Problem Relation Age of Onset  . Osteoporosis Mother   . Hypertension Father   . CAD Father     Social History Social History  Substance Use Topics  . Smoking status: Never Smoker  .  Smokeless tobacco: Never Used  . Alcohol use Yes     Comment: 1-2 per week    Current Outpatient Prescriptions  Medication Sig Dispense Refill  . allopurinol (ZYLOPRIM) 100 MG tablet Take 100 mg by mouth daily.    Marland Kitchen aspirin 81 MG tablet Take 81 mg by mouth daily.    . folic acid (FOLVITE) 1 MG tablet Take 1 tablet (1 mg total) by mouth daily. 30 tablet 1  . gabapentin (NEURONTIN) 100 MG capsule Take 100 mg by mouth 2 (two) times daily.    . hydroxychloroquine (PLAQUENIL) 200 MG tablet Take 200 mg by mouth daily. Take with food or milk     . metoprolol tartrate (LOPRESSOR) 25 MG tablet Take 0.5 tablets (12.5 mg total) by mouth 2 (two) times daily. 30 tablet 1  . Multiple Vitamins-Minerals (OCUVITE EYE HEALTH FORMULA PO) Take 1 tablet by mouth daily.    . potassium chloride SA (K-DUR,KLOR-CON) 20 MEQ tablet Take 1 tablet (20 mEq total) by mouth daily. 30 tablet 0  . rivaroxaban (XARELTO) 20 MG TABS tablet Take 1 tablet (20 mg total) by mouth daily with supper. 30 tablet 2  . sertraline (ZOLOFT) 50 MG tablet Take 50 mg by mouth daily.    . simvastatin (ZOCOR) 20 MG tablet Take 20 mg by mouth daily.    . traMADol (ULTRAM) 50 MG tablet Take one to two (50 mg to 100 mg total) by mouth every  six (6) hours as needed for pain.    . furosemide (LASIX) 40 MG tablet Take 1 tablet (40 mg total) by mouth daily. 30 tablet 6   No current facility-administered medications for this visit.     Allergies  Allergen Reactions  . Penicillins Anaphylaxis    Has patient had a PCN reaction causing immediate rash, facial/tongue/throat swelling, SOB or lightheadedness with hypotension: Yes Has patient had a PCN reaction causing severe rash involving mucus membranes or skin necrosis: No Has patient had a PCN reaction that required hospitalization Yes Has patient had a PCN reaction occurring within the last 10 years: No If all of the above answers are "NO", then may proceed with Cephalosporin use.     Review of  Systems  Improved sleeping and strength Decrease pain and right lateral thigh No angina No orthopnea PND No ankle edema Surgical incisions healing well  BP 105/61   Pulse 76   Resp 20   Ht 5\' 10"  (1.778 m)   Wt 241 lb (109.3 kg)   SpO2 97% Comment: RA  BMI 34.58 kg/m  Physical Exam      Exam    General- alert and comfortable   Lungs- clear without rales, wheezes   Cor- regular rate and rhythm, no murmur , gallop   Abdomen- soft, non-tender   Extremities - warm, non-tender, minimal edema   Neuro- oriented, appropriate, no focal weakness   Diagnostic Tests: CT scan performed today with results as noted above. Normal findings of the aortic reconstruction for a limited type a dissection treated with straight graft from sinotubular  junction to proximal arch  Impression:Doing well postop emergency CABG and repair of a sending aorta Continue aspirin, continue metoprolol and Zocor No limitations to activities  Plan:Return as needed   Len Childs, MD [facility] [phone]

## 2017-01-04 ENCOUNTER — Encounter (HOSPITAL_COMMUNITY)
Admission: RE | Admit: 2017-01-04 | Discharge: 2017-01-04 | Disposition: A | Discharge status: Home / Self Care | Payer: Medicare Other | Source: Ambulatory Visit | Attending: Interventional Cardiology | Admitting: Interventional Cardiology

## 2017-01-04 ENCOUNTER — Encounter (HOSPITAL_COMMUNITY): Payer: Self-pay

## 2017-01-04 VITALS — BP 124/80 | HR 58 | Ht 69.5 in | Wt 242.3 lb

## 2017-01-04 DIAGNOSIS — Z79891 Long term (current) use of opiate analgesic: Secondary | ICD-10-CM | POA: Diagnosis not present

## 2017-01-04 DIAGNOSIS — Z79899 Other long term (current) drug therapy: Secondary | ICD-10-CM | POA: Insufficient documentation

## 2017-01-04 DIAGNOSIS — I10 Essential (primary) hypertension: Secondary | ICD-10-CM | POA: Diagnosis not present

## 2017-01-04 DIAGNOSIS — Z7982 Long term (current) use of aspirin: Secondary | ICD-10-CM | POA: Insufficient documentation

## 2017-01-04 DIAGNOSIS — I213 ST elevation (STEMI) myocardial infarction of unspecified site: Secondary | ICD-10-CM | POA: Diagnosis not present

## 2017-01-04 DIAGNOSIS — E785 Hyperlipidemia, unspecified: Secondary | ICD-10-CM | POA: Insufficient documentation

## 2017-01-04 DIAGNOSIS — G4733 Obstructive sleep apnea (adult) (pediatric): Secondary | ICD-10-CM | POA: Insufficient documentation

## 2017-01-04 DIAGNOSIS — Z951 Presence of aortocoronary bypass graft: Secondary | ICD-10-CM

## 2017-01-04 NOTE — Progress Notes (Signed)
Cardiac Rehab Medication Review by a Pharmacist  Does the patient  feel that his/her medications are working for him/her?  yes  Has the patient been experiencing any side effects to the medications prescribed?  no  Does the patient measure his/her own blood pressure or blood glucose at home?  no   Does the patient have any problems obtaining medications due to transportation or finances?   no  Understanding of regimen: fair Understanding of indications: fair Potential of compliance: fair    Pharmacist comments: 14 YOM presenting today for cardiac rehab in good spirits. He does not have great understanding of his medications and says his wife helps him at home. One counseling point we discussed was how he needs to take his Xarelto with his evening meal instead of anytime at night. No other concerns.    Patrick Fritz 01/04/2017 8:18 AM

## 2017-01-04 NOTE — Progress Notes (Signed)
Cardiac Individual Treatment Plan  Patient Details  Name: Patrick Fritz MRN: [id number] Date of Birth: 08/25/1951 Referring Provider:     [facility] from 01/04/2017 in [place]  Referring Provider  Daneen Schick MD      Initial Encounter Date:    CARDIAC REHAB PHASE II ORIENTATION from 01/04/2017 in [place]  Date  01/04/17  Referring Provider  Daneen Schick MD      Visit Diagnosis: 05/15/16 ST elevation myocardial infarction (STEMI), unspecified artery ([employer])  05/22/16 S/P CABG x 2  Patient's Home Medications on Admission:  Current Outpatient Prescriptions:  .  allopurinol (ZYLOPRIM) 100 MG tablet, Take 100 mg by mouth daily., Disp: , Rfl:  .  aspirin 81 MG tablet, Take 81 mg by mouth daily., Disp: , Rfl:  .  folic acid (FOLVITE) 1 MG tablet, Take 1 tablet (1 mg total) by mouth daily., Disp: 30 tablet, Rfl: 1 .  gabapentin (NEURONTIN) 100 MG capsule, Take 100 mg by mouth 2 (two) times daily., Disp: , Rfl:  .  hydroxychloroquine (PLAQUENIL) 200 MG tablet, Take 200 mg by mouth daily. Take with food or milk , Disp: , Rfl:  .  metoprolol tartrate (LOPRESSOR) 25 MG tablet, Take 0.5 tablets (12.5 mg total) by mouth 2 (two) times daily., Disp: 30 tablet, Rfl: 1 .  Multiple Vitamins-Minerals (OCUVITE EYE HEALTH FORMULA PO), Take 1 tablet by mouth daily., Disp: , Rfl:  .  potassium chloride SA (K-DUR,KLOR-CON) 20 MEQ tablet, Take 1 tablet (20 mEq total) by mouth daily., Disp: 30 tablet, Rfl: 0 .  rivaroxaban (XARELTO) 20 MG TABS tablet, Take 1 tablet (20 mg total) by mouth daily with supper., Disp: 30 tablet, Rfl: 2 .  sertraline (ZOLOFT) 50 MG tablet, Take 50 mg by mouth daily., Disp: , Rfl:  .  simvastatin (ZOCOR) 20 MG tablet, Take 20 mg by mouth daily., Disp: , Rfl:  .  traMADol (ULTRAM) 50 MG tablet, Take one to two (50 mg to 100 mg total) by mouth every six (6) hours as needed for pain., Disp: ,  Rfl:  .  furosemide (LASIX) 40 MG tablet, Take 1 tablet (40 mg total) by mouth daily., Disp: 30 tablet, Rfl: 6  Past Medical History: Past Medical History:  Diagnosis Date  . DJD (degenerative joint disease), lumbar   . DVT (deep venous thrombosis) ([place])   . Glaucoma    R eye diminished vision approx 50%  . High cholesterol   . Hyperlipidemia   . Hypertension   . IFG (impaired fasting glucose)   . OSA (obstructive sleep apnea)    Severe w AHI 34/hr on HST no on BiPAP at 19/15cm H2O  . PE (pulmonary embolism)    Second occurance,enknown etiology,lifelong anticoagulation    Tobacco Use: History  Smoking Status  . Never Smoker  Smokeless Tobacco  . Never Used    Labs: Recent Review Flowsheet Data    Labs for ITP Cardiac and Pulmonary Rehab Latest Ref Rng & Units 05/18/2016 05/19/2016 05/20/2016 05/21/2016 05/23/2016   Cholestrol 0 - 200 mg/dL - - - - -   LDLCALC 0 - 99 mg/dL - - - - -   HDL >40 mg/dL - - - - -   Trlycerides <150 mg/dL - - - - -   Hemoglobin A1c 4.8 - 5.6 % - - - - -   PHART 7.350 - 7.450 - - - - -  PCO2ART 35.0 - 45.0 mmHg - - - - -   HCO3 20.0 - 24.0 mEq/L - - - - -   TCO2 0 - 100 mmol/L 27 - - - -   ACIDBASEDEF 0.0 - 2.0 mmol/L - - - - -   O2SAT % - 59.5 68.5 56.6 58.6      Capillary Blood Glucose: Lab Results  Component Value Date   GLUCAP 111 (H) 05/19/2016   GLUCAP 88 05/19/2016   GLUCAP 130 (H) 05/18/2016   GLUCAP 115 (H) 05/18/2016   GLUCAP 107 (H) 05/18/2016     Exercise Target Goals: Date: 01/04/17  Exercise Program Goal: Individual exercise prescription set with THRR, safety & activity barriers. Participant demonstrates ability to understand and report RPE using BORG scale, to self-measure pulse accurately, and to acknowledge the importance of the exercise prescription.  Exercise Prescription Goal: Starting with aerobic activity 30 plus minutes a day, 3 days per week for initial exercise prescription. Provide home exercise prescription and  guidelines that participant acknowledges understanding prior to discharge.  Activity Barriers & Risk Stratification:     Activity Barriers & Cardiac Risk Stratification - 01/04/17 0845      Activity Barriers & Cardiac Risk Stratification   Activity Barriers Left Knee Replacement;Right Knee Replacement;Deconditioning;Muscular Weakness;Back Problems;Other (comment)   Comments neck pain   Cardiac Risk Stratification High      6 Minute Walk:     6 Minute Walk    Row Name 01/04/17 1225         6 Minute Walk   Phase Initial     Distance 1500 feet     Walk Time 6 minutes     # of Rest Breaks 0     MPH 2.84     METS 2.92     RPE 7     VO2 Peak 10.23     Symptoms No     Resting HR 58 bpm     Resting BP 124/80     Max Ex. HR 99 bpm     Max Ex. BP 126/78     2 Minute Post BP 114/76        Oxygen Initial Assessment:   Oxygen Re-Evaluation:   Oxygen Discharge (Final Oxygen Re-Evaluation):   Initial Exercise Prescription:     Initial Exercise Prescription - 01/04/17 1300      Treadmill   MPH 2.3   Grade 1   Minutes 10   METs 3.08     Bike   Level 0.8   Minutes 10   METs 2.37     NuStep   Level 2   SPM 85   Minutes 10   METs 2.2     Prescription Details   Frequency (times per week) 5   Duration Progress to 45 minutes of aerobic exercise without signs/symptoms of physical distress     Intensity   THRR 40-80% of Max Heartrate 61-122   Ratings of Perceived Exertion 11-13   Perceived Dyspnea 0-4     Progression   Progression Continue to progress workloads to maintain intensity without signs/symptoms of physical distress.     Resistance Training   Training Prescription Yes   Weight 2   Reps 10-15      Perform Capillary Blood Glucose checks as needed.  Exercise Prescription Changes:   Exercise Comments:   Exercise Goals and Review:     Exercise Goals    Row Name 01/04/17 [phone]  Exercise Goals   Increase Physical  Activity Yes       Intervention Provide advice, education, support and counseling about physical activity/exercise needs.;Develop an individualized exercise prescription for aerobic and resistive training based on initial evaluation findings, risk stratification, comorbidities and participant's personal goals.       Expected Outcomes Achievement of increased cardiorespiratory fitness and enhanced flexibility, muscular endurance and strength shown through measurements of functional capacity and personal statement of participant.       Increase Strength and Stamina Yes       Intervention Provide advice, education, support and counseling about physical activity/exercise needs.;Develop an individualized exercise prescription for aerobic and resistive training based on initial evaluation findings, risk stratification, comorbidities and participant's personal goals.       Expected Outcomes Achievement of increased cardiorespiratory fitness and enhanced flexibility, muscular endurance and strength shown through measurements of functional capacity and personal statement of participant.          Exercise Goals Re-Evaluation :    Discharge Exercise Prescription (Final Exercise Prescription Changes):   Nutrition:  Target Goals: Understanding of nutrition guidelines, daily intake of sodium 1500mg , cholesterol 200mg , calories 30% from fat and 7% or less from saturated fats, daily to have 5 or more servings of fruits and vegetables.  Biometrics:     Pre Biometrics - 01/04/17 1336      Pre Biometrics   Waist Circumference (P)  44.5 inches   Hip Circumference (P)  44.25 inches   Waist to Hip Ratio (P)  1.01 %   Triceps Skinfold (P)  23 mm   % Body Fat (P)  34 %   Grip Strength (P)  32.5 kg   Flexibility (P)  9 in   Single Leg Stand (P)  3.17 seconds       Nutrition Therapy Plan and Nutrition Goals:     Nutrition Therapy & Goals - 01/04/17 1506      Nutrition Therapy   Diet Therapeutic  Lifestyle Changes     Personal Nutrition Goals   Nutrition Goal Wt loss of 1-2 lb/week to a wt loss goal of 6-24 lb at graduation from [facility], educate and counsel regarding individualized specific dietary modifications aiming towards targeted core components such as weight, hypertension, lipid management, diabetes, heart failure and other comorbidities.   Expected Outcomes Short Term Goal: Understand basic principles of dietary content, such as calories, fat, sodium, cholesterol and nutrients.;Long Term Goal: Adherence to prescribed nutrition plan.      Nutrition Discharge: Nutrition Scores:     Nutrition Assessments - 01/04/17 1506      MEDFICTS Scores   Pre Score 75      Nutrition Goals Re-Evaluation:   Nutrition Goals Re-Evaluation:   Nutrition Goals Discharge (Final Nutrition Goals Re-Evaluation):   Psychosocial: Target Goals: Acknowledge presence or absence of significant depression and/or stress, maximize coping skills, provide positive support system. Participant is able to verbalize types and ability to use techniques and skills needed for reducing stress and depression.  Initial Review & Psychosocial Screening:     Initial Psych Review & Screening - 01/04/17 1724      Initial Review   Current issues with None Identified     Family Dynamics   Good Support System? Yes     Barriers   Psychosocial barriers to participate in program There are no identifiable barriers or psychosocial needs.      Quality of Life Scores:  Quality of Life - 01/04/17 1035      Quality of Life Scores   Health/Function Pre 25.3 %   Socioeconomic Pre 24.92 %   Psych/Spiritual Pre 25.36 %   Family Pre 22.2 %   GLOBAL Pre 24.77 %      PHQ-9: Recent Review Flowsheet Data    There is no flowsheet data to display.     Interpretation of Total Score  Total Score Depression Severity:  1-4 = Minimal depression, 5-9 =  Mild depression, 10-14 = Moderate depression, 15-19 = Moderately severe depression, 20-27 = Severe depression   Psychosocial Evaluation and Intervention:   Psychosocial Re-Evaluation:   Psychosocial Discharge (Final Psychosocial Re-Evaluation):   Vocational Rehabilitation: Provide vocational rehab assistance to qualifying candidates.   Vocational Rehab Evaluation & Intervention:     Vocational Rehab - 01/04/17 1724      Initial Vocational Rehab Evaluation & Intervention   Assessment shows need for Vocational Rehabilitation No  pt retired.      Education: Education Goals: Education classes will be provided on a weekly basis, covering required topics. Participant will state understanding/return demonstration of topics presented.  Learning Barriers/Preferences:     Learning Barriers/Preferences - 01/04/17 0844      Learning Barriers/Preferences   Learning Barriers Sight;Hearing   Learning Preferences Written Material      Education Topics: Count Your Pulse:  -Group instruction provided by verbal instruction, demonstration, patient participation and written materials to support subject.  Instructors address importance of being able to find your pulse and how to count your pulse when at home without a heart monitor.  Patients get hands on experience counting their pulse with staff help and individually.   Heart Attack, Angina, and Risk Factor Modification:  -Group instruction provided by verbal instruction, video, and written materials to support subject.  Instructors address signs and symptoms of angina and heart attacks.    Also discuss risk factors for heart disease and how to make changes to improve heart health risk factors.   Functional Fitness:  -Group instruction provided by verbal instruction, demonstration, patient participation, and written materials to support subject.  Instructors address safety measures for doing things around the house.  Discuss how to get  up and down off the floor, how to pick things up properly, how to safely get out of a chair without assistance, and balance training.   Meditation and Mindfulness:  -Group instruction provided by verbal instruction, patient participation, and written materials to support subject.  Instructor addresses importance of mindfulness and meditation practice to help reduce stress and improve awareness.  Instructor also leads participants through a meditation exercise.    Stretching for Flexibility and Mobility:  -Group instruction provided by verbal instruction, patient participation, and written materials to support subject.  Instructors lead participants through series of stretches that are designed to increase flexibility thus improving mobility.  These stretches are additional exercise for major muscle groups that are typically performed during regular warm up and cool down.   Hands Only CPR Anytime:  -Group instruction provided by verbal instruction, video, patient participation and written materials to support subject.  Instructors co-teach with AHA video for hands only CPR.  Participants get hands on experience with mannequins.   Nutrition I class: Heart Healthy Eating:  -Group instruction provided by PowerPoint slides, verbal discussion, and written materials to support subject matter. The instructor gives an explanation and review of the Therapeutic Lifestyle Changes diet recommendations, which includes a discussion on lipid goals, dietary fat,  sodium, fiber, plant stanol/sterol esters, sugar, and the components of a well-balanced, healthy diet.   Nutrition II class: Lifestyle Skills:  -Group instruction provided by PowerPoint slides, verbal discussion, and written materials to support subject matter. The instructor gives an explanation and review of label reading, grocery shopping for heart health, heart healthy recipe modifications, and ways to make healthier choices when eating  out.   Diabetes Question & Answer:  -Group instruction provided by PowerPoint slides, verbal discussion, and written materials to support subject matter. The instructor gives an explanation and review of diabetes co-morbidities, pre- and post-prandial blood glucose goals, pre-exercise blood glucose goals, signs, symptoms, and treatment of hypoglycemia and hyperglycemia, and foot care basics.   Diabetes Blitz:  -Group instruction provided by PowerPoint slides, verbal discussion, and written materials to support subject matter. The instructor gives an explanation and review of the physiology behind type 1 and type 2 diabetes, diabetes medications and rational behind using different medications, pre- and post-prandial blood glucose recommendations and Hemoglobin A1c goals, diabetes diet, and exercise including blood glucose guidelines for exercising safely.    Portion Distortion:  -Group instruction provided by PowerPoint slides, verbal discussion, written materials, and food models to support subject matter. The instructor gives an explanation of serving size versus portion size, changes in portions sizes over the last 20 years, and what consists of a serving from each food group.   Stress Management:  -Group instruction provided by verbal instruction, video, and written materials to support subject matter.  Instructors review role of stress in heart disease and how to cope with stress positively.     Exercising on Your Own:  -Group instruction provided by verbal instruction, power point, and written materials to support subject.  Instructors discuss benefits of exercise, components of exercise, frequency and intensity of exercise, and end points for exercise.  Also discuss use of nitroglycerin and activating EMS.  Review options of places to exercise outside of rehab.  Review guidelines for sex with heart disease.   Cardiac Drugs I:  -Group instruction provided by verbal instruction and  written materials to support subject.  Instructor reviews cardiac drug classes: antiplatelets, anticoagulants, beta blockers, and statins.  Instructor discusses reasons, side effects, and lifestyle considerations for each drug class.   Cardiac Drugs II:  -Group instruction provided by verbal instruction and written materials to support subject.  Instructor reviews cardiac drug classes: angiotensin converting enzyme inhibitors (ACE-I), angiotensin II receptor blockers (ARBs), nitrates, and calcium channel blockers.  Instructor discusses reasons, side effects, and lifestyle considerations for each drug class.   Anatomy and Physiology of the Circulatory System:  -Group instruction provided by verbal instruction, video, and written materials to support subject.  Reviews functional anatomy of heart, how it relates to various diagnoses, and what role the heart plays in the overall system.   Knowledge Questionnaire Score:     Knowledge Questionnaire Score - 01/04/17 1033      Knowledge Questionnaire Score   Pre Score 19/24      Core Components/Risk Factors/Patient Goals at Admission:     Personal Goals and Risk Factors at Admission - 01/04/17 0845      Core Components/Risk Factors/Patient Goals on Admission    Weight Management Yes;Obesity;Weight Loss;Weight Maintenance   Intervention Weight Management: Develop a combined nutrition and exercise program designed to reach desired caloric intake, while maintaining appropriate intake of nutrient and fiber, sodium and fats, and appropriate energy expenditure required for the weight goal.;Weight Management: Provide education and appropriate resources  to help participant work on and attain dietary goals.;Weight Management/Obesity: Establish reasonable short term and long term weight goals.;Obesity: Provide education and appropriate resources to help participant work on and attain dietary goals.   Expected Outcomes Short Term: Continue to assess and  modify interventions until short term weight is achieved;Weight Maintenance: Understanding of the daily nutrition guidelines, which includes 25-35% calories from fat, 7% or less cal from saturated fats, less than 200mg  cholesterol, less than 1.5gm of sodium, & 5 or more servings of fruits and vegetables daily;Weight Loss: Understanding of general recommendations for a balanced deficit meal plan, which promotes 1-2 lb weight loss per week and includes a negative energy balance of [phone] kcal/d;Long Term: Adherence to nutrition and physical activity/exercise program aimed toward attainment of established weight goal;Understanding recommendations for meals to include 15-35% energy as protein, 25-35% energy from fat, 35-60% energy from carbohydrates, less than 200mg  of dietary cholesterol, 20-35 gm of total fiber daily;Understanding of distribution of calorie intake throughout the day with the consumption of 4-5 meals/snacks   Lipids Yes   Intervention Provide education and support for participant on nutrition & aerobic/resistive exercise along with prescribed medications to achieve LDL 70mg , HDL >40mg .   Expected Outcomes Short Term: Participant states understanding of desired cholesterol values and is compliant with medications prescribed. Participant is following exercise prescription and nutrition guidelines.;Long Term: Cholesterol controlled with medications as prescribed, with individualized exercise RX and with personalized nutrition plan. Value goals: LDL < 70mg , HDL > 40 mg.   Personal Goal Other Yes      Core Components/Risk Factors/Patient Goals Review:    Core Components/Risk Factors/Patient Goals at Discharge (Final Review):    ITP Comments:     ITP Comments    Row Name 01/04/17 0842           ITP Comments Dr. Fransico Him, Medical Director          Comments:  Pt in today for orientation for cardiac rehab phase II from 0800 to 1000.  As a part of the cardiac rehab orientation  pt completed warm [place] stretches and 6 minute walk test.  Pt tolerated well with no complaints of cp or sob. Monitor showed SR with pac and pvc.  This is present on pt monitor strips from the hospital. Brief psychosocial assessment reveals no barriers to participation. Pt is accompanied by his wife.  Pt is looking forward to beginning cardiac rehab for a brief period because he will start planting his garden. Cherre Huger, BSN Cardiac and Training and development officer

## 2017-01-08 ENCOUNTER — Encounter (HOSPITAL_COMMUNITY): Payer: Medicare Other

## 2017-01-10 ENCOUNTER — Ambulatory Visit: Payer: Medicare Other | Admitting: Cardiothoracic Surgery

## 2017-01-10 ENCOUNTER — Encounter (HOSPITAL_COMMUNITY): Payer: Self-pay

## 2017-01-10 ENCOUNTER — Encounter (HOSPITAL_COMMUNITY)
Admission: RE | Admit: 2017-01-10 | Discharge: 2017-01-10 | Disposition: A | Discharge status: Home / Self Care | Payer: Medicare Other | Source: Ambulatory Visit | Attending: Interventional Cardiology | Admitting: Interventional Cardiology

## 2017-01-10 DIAGNOSIS — I213 ST elevation (STEMI) myocardial infarction of unspecified site: Secondary | ICD-10-CM | POA: Diagnosis not present

## 2017-01-10 DIAGNOSIS — I10 Essential (primary) hypertension: Secondary | ICD-10-CM | POA: Diagnosis not present

## 2017-01-10 DIAGNOSIS — Z79899 Other long term (current) drug therapy: Secondary | ICD-10-CM | POA: Diagnosis not present

## 2017-01-10 DIAGNOSIS — Z951 Presence of aortocoronary bypass graft: Secondary | ICD-10-CM

## 2017-01-10 DIAGNOSIS — G4733 Obstructive sleep apnea (adult) (pediatric): Secondary | ICD-10-CM | POA: Diagnosis not present

## 2017-01-10 DIAGNOSIS — Z7982 Long term (current) use of aspirin: Secondary | ICD-10-CM | POA: Diagnosis not present

## 2017-01-10 DIAGNOSIS — Z79891 Long term (current) use of opiate analgesic: Secondary | ICD-10-CM | POA: Diagnosis not present

## 2017-01-10 NOTE — Progress Notes (Signed)
Daily Session Note  Patient Details  Name: Patrick Fritz MRN: [id number] Date of Birth: 1951/02/28 Referring Provider:     [facility] from 01/04/2017 in [place]  Referring Provider  Daneen Schick MD      Encounter Date: 01/10/2017  Check In:     Session Check In - 01/10/17 0819      Check-In   Location [facility]   Staff Present Cleda Mccreedy, MS, Exercise Physiologist;Amber Fair, MS, ACSM RCEP, Exercise Physiologist;Rani Idler, RN, Marga Melnick, RN, BSN   Supervising physician immediately available to respond to emergencies [facility] immediately available   Physician(s) Dr. Doyle Askew   Medication changes reported     No   Fall or balance concerns reported    No   Tobacco Cessation No Change   Warm-up and Cool-down Performed as group-led instruction   Resistance Training Performed No   VAD Patient? No     Pain Assessment   Currently in Pain? No/denies   Multiple Pain Sites No      Capillary Blood Glucose: No results found for this or any previous visit (from the past 24 hour(s)).    History  Smoking Status  . Never Smoker  Smokeless Tobacco  . Never Used    Goals Met:  Exercise tolerated well  Goals Unmet:  Not Applicable  Comments: Pt started cardiac rehab today.  Pt tolerated light exercise without difficulty. VSS, telemetry-sinus rhythm,  asymptomatic.  Medication list reconciled. Pt denies barriers to medicaiton compliance.  PSYCHOSOCIAL ASSESSMENT:  PHQ-0.  Pt exhibits positive coping skills, hopeful outlook with supportive family. No psychosocial needs identified at this time, no psychosocial interventions necessary.    Pt enjoys gardening, canning vegetables and spending time with his 2yo grandson.  His goals are to decrease dyspnea and fatigue to be able to increase strength and stamina.     Pt oriented to exercise equipment and routine.    Understanding  verbalized.   Dr. Fransico Him is Medical Director for Cardiac Rehab at [facility].

## 2017-01-10 NOTE — Progress Notes (Signed)
Cardiac Individual Treatment Plan  Patient Details  Name: Patrick Fritz MRN: [id number] Date of Birth: December 13, 1950 Referring Provider:     [place] from 01/04/2017 in [place]  Referring Provider  Daneen Schick MD      Initial Encounter Date:    CARDIAC REHAB PHASE II ORIENTATION from 01/04/2017 in [place]  Date  01/04/17  Referring Provider  Daneen Schick MD      Visit Diagnosis: 05/15/16 ST elevation myocardial infarction (STEMI), unspecified artery ([place])  05/22/16 S/P CABG x 2  Patient's Home Medications on Admission:  Current Outpatient Prescriptions:  .  allopurinol (ZYLOPRIM) 100 MG tablet, Take 100 mg by mouth daily., Disp: , Rfl:  .  aspirin 81 MG tablet, Take 81 mg by mouth daily., Disp: , Rfl:  .  folic acid (FOLVITE) 1 MG tablet, Take 1 tablet (1 mg total) by mouth daily., Disp: 30 tablet, Rfl: 1 .  gabapentin (NEURONTIN) 100 MG capsule, Take 100 mg by mouth 2 (two) times daily., Disp: , Rfl:  .  hydroxychloroquine (PLAQUENIL) 200 MG tablet, Take 200 mg by mouth daily. Take with food or milk , Disp: , Rfl:  .  metoprolol tartrate (LOPRESSOR) 25 MG tablet, Take 0.5 tablets (12.5 mg total) by mouth 2 (two) times daily., Disp: 30 tablet, Rfl: 1 .  Multiple Vitamins-Minerals (OCUVITE EYE HEALTH FORMULA PO), Take 1 tablet by mouth daily., Disp: , Rfl:  .  potassium chloride SA (K-DUR,KLOR-CON) 20 MEQ tablet, Take 1 tablet (20 mEq total) by mouth daily., Disp: 30 tablet, Rfl: 0 .  rivaroxaban (XARELTO) 20 MG TABS tablet, Take 1 tablet (20 mg total) by mouth daily with supper., Disp: 30 tablet, Rfl: 2 .  sertraline (ZOLOFT) 50 MG tablet, Take 50 mg by mouth daily., Disp: , Rfl:  .  simvastatin (ZOCOR) 20 MG tablet, Take 20 mg by mouth daily., Disp: , Rfl:  .  traMADol (ULTRAM) 50 MG tablet, Take one to two (50 mg to 100 mg total) by mouth every six (6) hours as needed for pain., Disp: ,  Rfl:  .  furosemide (LASIX) 40 MG tablet, Take 1 tablet (40 mg total) by mouth daily., Disp: 30 tablet, Rfl: 6  Past Medical History: Past Medical History:  Diagnosis Date  . DJD (degenerative joint disease), lumbar   . DVT (deep venous thrombosis) ([place])   . Glaucoma    R eye diminished vision approx 50%  . High cholesterol   . Hyperlipidemia   . Hypertension   . IFG (impaired fasting glucose)   . OSA (obstructive sleep apnea)    Severe w AHI 34/hr on HST no on BiPAP at 19/15cm H2O  . PE (pulmonary embolism)    Second occurance,enknown etiology,lifelong anticoagulation    Tobacco Use: History  Smoking Status  . Never Smoker  Smokeless Tobacco  . Never Used    Labs: Recent Review Flowsheet Data    Labs for ITP Cardiac and Pulmonary Rehab Latest Ref Rng & Units 05/18/2016 05/19/2016 05/20/2016 05/21/2016 05/23/2016   Cholestrol 0 - 200 mg/dL - - - - -   LDLCALC 0 - 99 mg/dL - - - - -   HDL >40 mg/dL - - - - -   Trlycerides <150 mg/dL - - - - -   Hemoglobin A1c 4.8 - 5.6 % - - - - -   PHART 7.350 - 7.450 - - - - -  PCO2ART 35.0 - 45.0 mmHg - - - - -   HCO3 20.0 - 24.0 mEq/L - - - - -   TCO2 0 - 100 mmol/L 27 - - - -   ACIDBASEDEF 0.0 - 2.0 mmol/L - - - - -   O2SAT % - 59.5 68.5 56.6 58.6      Capillary Blood Glucose: Lab Results  Component Value Date   GLUCAP 111 (H) 05/19/2016   GLUCAP 88 05/19/2016   GLUCAP 130 (H) 05/18/2016   GLUCAP 115 (H) 05/18/2016   GLUCAP 107 (H) 05/18/2016     Exercise Target Goals:    Exercise Program Goal: Individual exercise prescription set with THRR, safety & activity barriers. Participant demonstrates ability to understand and report RPE using BORG scale, to self-measure pulse accurately, and to acknowledge the importance of the exercise prescription.  Exercise Prescription Goal: Starting with aerobic activity 30 plus minutes a day, 3 days per week for initial exercise prescription. Provide home exercise prescription and guidelines  that participant acknowledges understanding prior to discharge.  Activity Barriers & Risk Stratification:     Activity Barriers & Cardiac Risk Stratification - 01/04/17 0845      Activity Barriers & Cardiac Risk Stratification   Activity Barriers Left Knee Replacement;Right Knee Replacement;Deconditioning;Muscular Weakness;Back Problems;Other (comment)   Comments neck pain   Cardiac Risk Stratification High      6 Minute Walk:     6 Minute Walk    Row Name 01/04/17 1225         6 Minute Walk   Phase Initial     Distance 1500 feet     Walk Time 6 minutes     # of Rest Breaks 0     MPH 2.84     METS 2.92     RPE 7     VO2 Peak 10.23     Symptoms No     Resting HR 58 bpm     Resting BP 124/80     Max Ex. HR 99 bpm     Max Ex. BP 126/78     2 Minute Post BP 114/76        Oxygen Initial Assessment:   Oxygen Re-Evaluation:   Oxygen Discharge (Final Oxygen Re-Evaluation):   Initial Exercise Prescription:     Initial Exercise Prescription - 01/04/17 1300      Treadmill   MPH 2.3   Grade 1   Minutes 10   METs 3.08     Bike   Level 0.8   Minutes 10   METs 2.37     NuStep   Level 2   SPM 85   Minutes 10   METs 2.2     Prescription Details   Frequency (times per week) 5   Duration Progress to 45 minutes of aerobic exercise without signs/symptoms of physical distress     Intensity   THRR 40-80% of Max Heartrate 61-122   Ratings of Perceived Exertion 11-13   Perceived Dyspnea 0-4     Progression   Progression Continue to progress workloads to maintain intensity without signs/symptoms of physical distress.     Resistance Training   Training Prescription Yes   Weight 2   Reps 10-15      Perform Capillary Blood Glucose checks as needed.  Exercise Prescription Changes:     Exercise Prescription Changes    Row Name 01/10/17 1000             Response to Exercise  Blood Pressure (Admit) 130/60       Blood Pressure (Exercise) 140/82        Blood Pressure (Exit) 120/70       Heart Rate (Admit) 94 bpm       Heart Rate (Exercise) 120 bpm       Heart Rate (Exit) 77 bpm       Rating of Perceived Exertion (Exercise) 12       Symptoms none       Comments Pt completed 1st day of exercise without any signs of distress, CP or dizziness       Duration Continue with 30 min of aerobic exercise without signs/symptoms of physical distress.       Intensity THRR unchanged         Resistance Training   Training Prescription Yes       Weight 2       Reps 10-15       Time 10 Minutes         Treadmill   MPH 2.3       Grade 1       Minutes 10       METs 3.08         Bike   Level 1.5  upright scifit       Minutes 10       METs 2.37         NuStep   Level 3       SPM 85       Minutes 10       METs 2.5          Exercise Comments:     Exercise Comments    Row Name 01/10/17 1043           Exercise Comments Pt tolerated exercise in cardiac rehab very well; will continue to monitor pt's progress          Exercise Goals and Review:     Exercise Goals    Row Name 01/04/17 0845             Exercise Goals   Increase Physical Activity Yes       Intervention Provide advice, education, support and counseling about physical activity/exercise needs.;Develop an individualized exercise prescription for aerobic and resistive training based on initial evaluation findings, risk stratification, comorbidities and participant's personal goals.       Expected Outcomes Achievement of increased cardiorespiratory fitness and enhanced flexibility, muscular endurance and strength shown through measurements of functional capacity and personal statement of participant.       Increase Strength and Stamina Yes       Intervention Provide advice, education, support and counseling about physical activity/exercise needs.;Develop an individualized exercise prescription for aerobic and resistive training based on initial evaluation findings,  risk stratification, comorbidities and participant's personal goals.       Expected Outcomes Achievement of increased cardiorespiratory fitness and enhanced flexibility, muscular endurance and strength shown through measurements of functional capacity and personal statement of participant.          Exercise Goals Re-Evaluation :    Discharge Exercise Prescription (Final Exercise Prescription Changes):     Exercise Prescription Changes - 01/10/17 1000      Response to Exercise   Blood Pressure (Admit) 130/60   Blood Pressure (Exercise) 140/82   Blood Pressure (Exit) 120/70   Heart Rate (Admit) 94 bpm   Heart Rate (Exercise) 120 bpm   Heart Rate (Exit) 77 bpm  Rating of Perceived Exertion (Exercise) 12   Symptoms none   Comments Pt completed 1st day of exercise without any signs of distress, CP or dizziness   Duration Continue with 30 min of aerobic exercise without signs/symptoms of physical distress.   Intensity THRR unchanged     Resistance Training   Training Prescription Yes   Weight 2   Reps 10-15   Time 10 Minutes     Treadmill   MPH 2.3   Grade 1   Minutes 10   METs 3.08     Bike   Level 1.5  upright scifit   Minutes 10   METs 2.37     NuStep   Level 3   SPM 85   Minutes 10   METs 2.5      Nutrition:  Target Goals: Understanding of nutrition guidelines, daily intake of sodium 1500mg , cholesterol 200mg , calories 30% from fat and 7% or less from saturated fats, daily to have 5 or more servings of fruits and vegetables.  Biometrics:     Pre Biometrics - 01/04/17 1336      Pre Biometrics   Waist Circumference (P)  44.5 inches   Hip Circumference (P)  44.25 inches   Waist to Hip Ratio (P)  1.01 %   Triceps Skinfold (P)  23 mm   % Body Fat (P)  34 %   Grip Strength (P)  32.5 kg   Flexibility (P)  9 in   Single Leg Stand (P)  3.17 seconds       Nutrition Therapy Plan and Nutrition Goals:     Nutrition Therapy & Goals - 01/04/17 1506       Nutrition Therapy   Diet Therapeutic Lifestyle Changes     Personal Nutrition Goals   Nutrition Goal Wt loss of 1-2 lb/week to a wt loss goal of 6-24 lb at graduation from [facility], educate and counsel regarding individualized specific dietary modifications aiming towards targeted core components such as weight, hypertension, lipid management, diabetes, heart failure and other comorbidities.   Expected Outcomes Short Term Goal: Understand basic principles of dietary content, such as calories, fat, sodium, cholesterol and nutrients.;Long Term Goal: Adherence to prescribed nutrition plan.      Nutrition Discharge: Nutrition Scores:     Nutrition Assessments - 01/04/17 1506      MEDFICTS Scores   Pre Score 75      Nutrition Goals Re-Evaluation:   Nutrition Goals Re-Evaluation:   Nutrition Goals Discharge (Final Nutrition Goals Re-Evaluation):   Psychosocial: Target Goals: Acknowledge presence or absence of significant depression and/or stress, maximize coping skills, provide positive support system. Participant is able to verbalize types and ability to use techniques and skills needed for reducing stress and depression.  Initial Review & Psychosocial Screening:     Initial Psych Review & Screening - 01/04/17 1724      Initial Review   Current issues with None Identified     Family Dynamics   Good Support System? Yes     Barriers   Psychosocial barriers to participate in program There are no identifiable barriers or psychosocial needs.      Quality of Life Scores:     Quality of Life - 01/04/17 1035      Quality of Life Scores   Health/Function Pre 25.3 %   Socioeconomic Pre 24.92 %   Psych/Spiritual Pre 25.36 %   Family Pre 22.2 %  GLOBAL Pre 24.77 %      PHQ-9: Recent Review Flowsheet Data    There is no flowsheet data to display.     Interpretation of Total Score  Total Score Depression  Severity:  1-4 = Minimal depression, 5-9 = Mild depression, 10-14 = Moderate depression, 15-19 = Moderately severe depression, 20-27 = Severe depression   Psychosocial Evaluation and Intervention:     Psychosocial Evaluation - 01/10/17 1034      Psychosocial Evaluation & Interventions   Interventions Encouraged to exercise with the program and follow exercise prescription   Comments no psychosocial needs identified, no interventions necessary.  pt enjoys gardening and spending time with his 26 yo great grandson.     Expected Outcomes pt will exhibit good coping skills with positive, hopeful outlook.    Continue Psychosocial Services  No Follow up required      Psychosocial Re-Evaluation:   Psychosocial Discharge (Final Psychosocial Re-Evaluation):   Vocational Rehabilitation: Provide vocational rehab assistance to qualifying candidates.   Vocational Rehab Evaluation & Intervention:     Vocational Rehab - 01/04/17 1724      Initial Vocational Rehab Evaluation & Intervention   Assessment shows need for Vocational Rehabilitation No  pt retired.      Education: Education Goals: Education classes will be provided on a weekly basis, covering required topics. Participant will state understanding/return demonstration of topics presented.  Learning Barriers/Preferences:     Learning Barriers/Preferences - 01/04/17 0844      Learning Barriers/Preferences   Learning Barriers Sight;Hearing   Learning Preferences Written Material      Education Topics: Count Your Pulse:  -Group instruction provided by verbal instruction, demonstration, patient participation and written materials to support subject.  Instructors address importance of being able to find your pulse and how to count your pulse when at home without a heart monitor.  Patients get hands on experience counting their pulse with staff help and individually.   Heart Attack, Angina, and Risk Factor Modification:  -Group  instruction provided by verbal instruction, video, and written materials to support subject.  Instructors address signs and symptoms of angina and heart attacks.    Also discuss risk factors for heart disease and how to make changes to improve heart health risk factors.   CARDIAC REHAB PHASE II EXERCISE from 01/10/2017 in [place]  Date  01/10/17  Instruction Review Code  2- meets goals/outcomes      Functional Fitness:  -Group instruction provided by verbal instruction, demonstration, patient participation, and written materials to support subject.  Instructors address safety measures for doing things around the house.  Discuss how to get up and down off the floor, how to pick things up properly, how to safely get out of a chair without assistance, and balance training.   Meditation and Mindfulness:  -Group instruction provided by verbal instruction, patient participation, and written materials to support subject.  Instructor addresses importance of mindfulness and meditation practice to help reduce stress and improve awareness.  Instructor also leads participants through a meditation exercise.    Stretching for Flexibility and Mobility:  -Group instruction provided by verbal instruction, patient participation, and written materials to support subject.  Instructors lead participants through series of stretches that are designed to increase flexibility thus improving mobility.  These stretches are additional exercise for major muscle groups that are typically performed during regular warm up and cool down.   Hands Only CPR Anytime:  -Group instruction provided by verbal instruction,  video, patient participation and written materials to support subject.  Instructors co-teach with AHA video for hands only CPR.  Participants get hands on experience with mannequins.   Nutrition I class: Heart Healthy Eating:  -Group instruction provided by PowerPoint slides,  verbal discussion, and written materials to support subject matter. The instructor gives an explanation and review of the Therapeutic Lifestyle Changes diet recommendations, which includes a discussion on lipid goals, dietary fat, sodium, fiber, plant stanol/sterol esters, sugar, and the components of a well-balanced, healthy diet.   Nutrition II class: Lifestyle Skills:  -Group instruction provided by PowerPoint slides, verbal discussion, and written materials to support subject matter. The instructor gives an explanation and review of label reading, grocery shopping for heart health, heart healthy recipe modifications, and ways to make healthier choices when eating out.   Diabetes Question & Answer:  -Group instruction provided by PowerPoint slides, verbal discussion, and written materials to support subject matter. The instructor gives an explanation and review of diabetes co-morbidities, pre- and post-prandial blood glucose goals, pre-exercise blood glucose goals, signs, symptoms, and treatment of hypoglycemia and hyperglycemia, and foot care basics.   Diabetes Blitz:  -Group instruction provided by PowerPoint slides, verbal discussion, and written materials to support subject matter. The instructor gives an explanation and review of the physiology behind type 1 and type 2 diabetes, diabetes medications and rational behind using different medications, pre- and post-prandial blood glucose recommendations and Hemoglobin A1c goals, diabetes diet, and exercise including blood glucose guidelines for exercising safely.    Portion Distortion:  -Group instruction provided by PowerPoint slides, verbal discussion, written materials, and food models to support subject matter. The instructor gives an explanation of serving size versus portion size, changes in portions sizes over the last 20 years, and what consists of a serving from each food group.   Stress Management:  -Group instruction provided by  verbal instruction, video, and written materials to support subject matter.  Instructors review role of stress in heart disease and how to cope with stress positively.     Exercising on Your Own:  -Group instruction provided by verbal instruction, power point, and written materials to support subject.  Instructors discuss benefits of exercise, components of exercise, frequency and intensity of exercise, and end points for exercise.  Also discuss use of nitroglycerin and activating EMS.  Review options of places to exercise outside of rehab.  Review guidelines for sex with heart disease.   Cardiac Drugs I:  -Group instruction provided by verbal instruction and written materials to support subject.  Instructor reviews cardiac drug classes: antiplatelets, anticoagulants, beta blockers, and statins.  Instructor discusses reasons, side effects, and lifestyle considerations for each drug class.   Cardiac Drugs II:  -Group instruction provided by verbal instruction and written materials to support subject.  Instructor reviews cardiac drug classes: angiotensin converting enzyme inhibitors (ACE-I), angiotensin II receptor blockers (ARBs), nitrates, and calcium channel blockers.  Instructor discusses reasons, side effects, and lifestyle considerations for each drug class.   Anatomy and Physiology of the Circulatory System:  -Group instruction provided by verbal instruction, video, and written materials to support subject.  Reviews functional anatomy of heart, how it relates to various diagnoses, and what role the heart plays in the overall system.   Knowledge Questionnaire Score:     Knowledge Questionnaire Score - 01/04/17 1033      Knowledge Questionnaire Score   Pre Score 19/24      Core Components/Risk Factors/Patient Goals at Admission:  Personal Goals and Risk Factors at Admission - 01/04/17 0845      Core Components/Risk Factors/Patient Goals on Admission    Weight Management  Yes;Obesity;Weight Loss;Weight Maintenance   Intervention Weight Management: Develop a combined nutrition and exercise program designed to reach desired caloric intake, while maintaining appropriate intake of nutrient and fiber, sodium and fats, and appropriate energy expenditure required for the weight goal.;Weight Management: Provide education and appropriate resources to help participant work on and attain dietary goals.;Weight Management/Obesity: Establish reasonable short term and long term weight goals.;Obesity: Provide education and appropriate resources to help participant work on and attain dietary goals.   Expected Outcomes Short Term: Continue to assess and modify interventions until short term weight is achieved;Weight Maintenance: Understanding of the daily nutrition guidelines, which includes 25-35% calories from fat, 7% or less cal from saturated fats, less than 200mg  cholesterol, less than 1.5gm of sodium, & 5 or more servings of fruits and vegetables daily;Weight Loss: Understanding of general recommendations for a balanced deficit meal plan, which promotes 1-2 lb weight loss per week and includes a negative energy balance of [phone] kcal/d;Long Term: Adherence to nutrition and physical activity/exercise program aimed toward attainment of established weight goal;Understanding recommendations for meals to include 15-35% energy as protein, 25-35% energy from fat, 35-60% energy from carbohydrates, less than 200mg  of dietary cholesterol, 20-35 gm of total fiber daily;Understanding of distribution of calorie intake throughout the day with the consumption of 4-5 meals/snacks   Lipids Yes   Intervention Provide education and support for participant on nutrition & aerobic/resistive exercise along with prescribed medications to achieve LDL 70mg , HDL >40mg .   Expected Outcomes Short Term: Participant states understanding of desired cholesterol values and is compliant with medications prescribed.  Participant is following exercise prescription and nutrition guidelines.;Long Term: Cholesterol controlled with medications as prescribed, with individualized exercise RX and with personalized nutrition plan. Value goals: LDL < 70mg , HDL > 40 mg.   Personal Goal Other Yes      Core Components/Risk Factors/Patient Goals Review:    Core Components/Risk Factors/Patient Goals at Discharge (Final Review):    ITP Comments:     ITP Comments    Row Name 01/04/17 0842           ITP Comments Dr. Fransico Him, Medical Director          Comments: Pt is making expected progress toward personal goals after completing 2 sessions. Recommend continued exercise and life style modification education including  stress management and relaxation techniques to decrease cardiac risk profile.

## 2017-01-12 ENCOUNTER — Encounter (HOSPITAL_COMMUNITY)
Admission: RE | Admit: 2017-01-12 | Discharge: 2017-01-12 | Disposition: A | Discharge status: Home / Self Care | Payer: Medicare Other | Source: Ambulatory Visit | Attending: Interventional Cardiology | Admitting: Interventional Cardiology

## 2017-01-12 DIAGNOSIS — Z79891 Long term (current) use of opiate analgesic: Secondary | ICD-10-CM | POA: Diagnosis not present

## 2017-01-12 DIAGNOSIS — I10 Essential (primary) hypertension: Secondary | ICD-10-CM | POA: Diagnosis not present

## 2017-01-12 DIAGNOSIS — G4733 Obstructive sleep apnea (adult) (pediatric): Secondary | ICD-10-CM | POA: Diagnosis not present

## 2017-01-12 DIAGNOSIS — Z7982 Long term (current) use of aspirin: Secondary | ICD-10-CM | POA: Diagnosis not present

## 2017-01-12 DIAGNOSIS — Z79899 Other long term (current) drug therapy: Secondary | ICD-10-CM | POA: Diagnosis not present

## 2017-01-12 DIAGNOSIS — Z951 Presence of aortocoronary bypass graft: Secondary | ICD-10-CM

## 2017-01-12 DIAGNOSIS — I213 ST elevation (STEMI) myocardial infarction of unspecified site: Secondary | ICD-10-CM

## 2017-01-15 ENCOUNTER — Encounter (HOSPITAL_COMMUNITY)
Admission: RE | Admit: 2017-01-15 | Discharge: 2017-01-15 | Disposition: A | Discharge status: Home / Self Care | Payer: Medicare Other | Source: Ambulatory Visit | Attending: Interventional Cardiology | Admitting: Interventional Cardiology

## 2017-01-15 DIAGNOSIS — Z7982 Long term (current) use of aspirin: Secondary | ICD-10-CM | POA: Insufficient documentation

## 2017-01-15 DIAGNOSIS — I10 Essential (primary) hypertension: Secondary | ICD-10-CM | POA: Insufficient documentation

## 2017-01-15 DIAGNOSIS — E785 Hyperlipidemia, unspecified: Secondary | ICD-10-CM | POA: Insufficient documentation

## 2017-01-15 DIAGNOSIS — Z79891 Long term (current) use of opiate analgesic: Secondary | ICD-10-CM | POA: Diagnosis not present

## 2017-01-15 DIAGNOSIS — G4733 Obstructive sleep apnea (adult) (pediatric): Secondary | ICD-10-CM | POA: Insufficient documentation

## 2017-01-15 DIAGNOSIS — Z79899 Other long term (current) drug therapy: Secondary | ICD-10-CM | POA: Diagnosis not present

## 2017-01-15 DIAGNOSIS — Z951 Presence of aortocoronary bypass graft: Secondary | ICD-10-CM

## 2017-01-15 DIAGNOSIS — I213 ST elevation (STEMI) myocardial infarction of unspecified site: Secondary | ICD-10-CM | POA: Diagnosis not present

## 2017-01-17 ENCOUNTER — Encounter (HOSPITAL_COMMUNITY)
Admission: RE | Admit: 2017-01-17 | Discharge: 2017-01-17 | Disposition: A | Discharge status: Home / Self Care | Payer: Medicare Other | Source: Ambulatory Visit | Attending: Interventional Cardiology | Admitting: Interventional Cardiology

## 2017-01-17 DIAGNOSIS — I213 ST elevation (STEMI) myocardial infarction of unspecified site: Secondary | ICD-10-CM | POA: Diagnosis not present

## 2017-01-17 DIAGNOSIS — Z7982 Long term (current) use of aspirin: Secondary | ICD-10-CM | POA: Diagnosis not present

## 2017-01-17 DIAGNOSIS — Z951 Presence of aortocoronary bypass graft: Secondary | ICD-10-CM

## 2017-01-17 DIAGNOSIS — I10 Essential (primary) hypertension: Secondary | ICD-10-CM | POA: Diagnosis not present

## 2017-01-17 DIAGNOSIS — Z79891 Long term (current) use of opiate analgesic: Secondary | ICD-10-CM | POA: Diagnosis not present

## 2017-01-17 DIAGNOSIS — G4733 Obstructive sleep apnea (adult) (pediatric): Secondary | ICD-10-CM | POA: Diagnosis not present

## 2017-01-17 DIAGNOSIS — Z79899 Other long term (current) drug therapy: Secondary | ICD-10-CM | POA: Diagnosis not present

## 2017-01-17 NOTE — Progress Notes (Signed)
Patrick Fritz 66 y.o. male Nutrition Note Spoke with pt.  Nutrition Survey reviewed with pt. Pt is following Step 1 of the Therapeutic Lifestyle Changes diet. Pt expressed understanding of the information reviewed. Pt aware of nutrition education classes offered and is unable to attend nutrition classes. Lab Results  Component Value Date   HGBA1C 5.8 (H) 05/18/2016   Wt Readings from Last 3 Encounters:  01/04/17 242 lb 4.6 oz (109.9 kg)  12/13/16 241 lb (109.3 kg)  07/26/16 228 lb (103.4 kg)   Nutrition Diagnosis ? Food-and nutrition-related knowledge deficit related to lack of exposure to information as related to diagnosis of: ? CVD ? Pre-DM ? Obesity related to excessive energy intake as evidenced by a BMI of 35.3  Nutrition Intervention ? Benefits of adopting Therapeutic Lifestyle Changes discussed when Medficts reviewed. ? Pt to attend the Portion Distortion class ? Pt given handouts for: ? Nutrition I class ? Nutrition II class ? Continue client-centered nutrition education by RD, as part of interdisciplinary care.  Goal(s) ? Pt to identify and limit food sources of saturated fat, trans fat, and sodium ? Pt to identify food quantities necessary to achieve weight loss of 6-24 lb (2.7-10.9 kg) at graduation from cardiac rehab.   Monitor and Evaluate progress toward nutrition goal with team.  Derek Mound, M.Ed, RD, LDN, CDE 01/17/2017 9:39 AM

## 2017-01-19 ENCOUNTER — Encounter (HOSPITAL_COMMUNITY)
Admission: RE | Admit: 2017-01-19 | Discharge: 2017-01-19 | Disposition: A | Discharge status: Home / Self Care | Payer: Medicare Other | Source: Ambulatory Visit | Attending: Interventional Cardiology | Admitting: Interventional Cardiology

## 2017-01-19 DIAGNOSIS — Z79899 Other long term (current) drug therapy: Secondary | ICD-10-CM | POA: Diagnosis not present

## 2017-01-19 DIAGNOSIS — I213 ST elevation (STEMI) myocardial infarction of unspecified site: Secondary | ICD-10-CM

## 2017-01-19 DIAGNOSIS — G4733 Obstructive sleep apnea (adult) (pediatric): Secondary | ICD-10-CM | POA: Diagnosis not present

## 2017-01-19 DIAGNOSIS — Z79891 Long term (current) use of opiate analgesic: Secondary | ICD-10-CM | POA: Diagnosis not present

## 2017-01-19 DIAGNOSIS — Z7982 Long term (current) use of aspirin: Secondary | ICD-10-CM | POA: Diagnosis not present

## 2017-01-19 DIAGNOSIS — Z951 Presence of aortocoronary bypass graft: Secondary | ICD-10-CM

## 2017-01-19 DIAGNOSIS — I10 Essential (primary) hypertension: Secondary | ICD-10-CM | POA: Diagnosis not present

## 2017-01-19 NOTE — Progress Notes (Signed)
Reviewed home exercise with pt today.  Pt plans to 2-4 days a week at home for exercise.  Reviewed THR, pulse, RPE, sign and symptoms, NTG use, and when to call 911 or MD.  Also discussed weather considerations and indoor options.  Pt voiced understanding.

## 2017-01-22 ENCOUNTER — Encounter (HOSPITAL_COMMUNITY)
Admission: RE | Admit: 2017-01-22 | Discharge: 2017-01-22 | Disposition: A | Discharge status: Home / Self Care | Payer: Medicare Other | Source: Ambulatory Visit | Attending: Interventional Cardiology | Admitting: Interventional Cardiology

## 2017-01-22 ENCOUNTER — Ambulatory Visit (HOSPITAL_COMMUNITY): Payer: Self-pay | Admitting: Cardiac Rehabilitation

## 2017-01-22 DIAGNOSIS — Z79899 Other long term (current) drug therapy: Secondary | ICD-10-CM | POA: Diagnosis not present

## 2017-01-22 DIAGNOSIS — Z79891 Long term (current) use of opiate analgesic: Secondary | ICD-10-CM | POA: Diagnosis not present

## 2017-01-22 DIAGNOSIS — G4733 Obstructive sleep apnea (adult) (pediatric): Secondary | ICD-10-CM | POA: Diagnosis not present

## 2017-01-22 DIAGNOSIS — Z951 Presence of aortocoronary bypass graft: Secondary | ICD-10-CM

## 2017-01-22 DIAGNOSIS — I10 Essential (primary) hypertension: Secondary | ICD-10-CM | POA: Diagnosis not present

## 2017-01-22 DIAGNOSIS — Z7982 Long term (current) use of aspirin: Secondary | ICD-10-CM | POA: Diagnosis not present

## 2017-01-22 DIAGNOSIS — I213 ST elevation (STEMI) myocardial infarction of unspecified site: Secondary | ICD-10-CM

## 2017-01-24 ENCOUNTER — Encounter (HOSPITAL_COMMUNITY)
Admission: RE | Admit: 2017-01-24 | Discharge: 2017-01-24 | Disposition: A | Discharge status: Home / Self Care | Payer: Medicare Other | Source: Ambulatory Visit

## 2017-01-26 ENCOUNTER — Encounter (HOSPITAL_COMMUNITY): Payer: Medicare Other

## 2017-01-26 ENCOUNTER — Telehealth (HOSPITAL_COMMUNITY): Payer: Self-pay | Admitting: Cardiac Rehabilitation

## 2017-01-26 NOTE — Telephone Encounter (Signed)
Pt arrived early for cardiac rehab, however he did not exercise. He reported to staff that he is no longer interested in participating however did not wish to comment further.  Pt declined offer to perform post program measurements.  Attempt to contact pt to discuss his concerns and reason for early exit.  LMOM.

## 2017-01-29 ENCOUNTER — Encounter (HOSPITAL_COMMUNITY): Payer: Medicare Other

## 2017-01-31 ENCOUNTER — Encounter (HOSPITAL_COMMUNITY): Payer: Medicare Other

## 2017-02-02 ENCOUNTER — Encounter (HOSPITAL_COMMUNITY): Payer: Medicare Other

## 2017-02-05 ENCOUNTER — Encounter (HOSPITAL_COMMUNITY): Admission: RE | Admit: 2017-02-05 | Payer: Medicare Other | Source: Ambulatory Visit

## 2017-02-07 ENCOUNTER — Encounter (HOSPITAL_COMMUNITY): Payer: Medicare Other

## 2017-02-09 ENCOUNTER — Encounter (HOSPITAL_COMMUNITY): Payer: Medicare Other

## 2017-02-12 ENCOUNTER — Encounter (HOSPITAL_COMMUNITY): Payer: Medicare Other

## 2017-02-14 ENCOUNTER — Encounter (HOSPITAL_COMMUNITY): Payer: Medicare Other

## 2017-02-16 ENCOUNTER — Encounter (HOSPITAL_COMMUNITY): Payer: Medicare Other

## 2017-02-19 ENCOUNTER — Encounter (HOSPITAL_COMMUNITY): Payer: Medicare Other

## 2017-02-21 ENCOUNTER — Encounter (HOSPITAL_COMMUNITY): Payer: Medicare Other

## 2017-02-23 ENCOUNTER — Encounter (HOSPITAL_COMMUNITY): Payer: Medicare Other

## 2017-02-26 ENCOUNTER — Encounter (HOSPITAL_COMMUNITY): Payer: Medicare Other

## 2017-02-27 NOTE — Progress Notes (Signed)
Discharge Summary  Patient Details  Name: Patrick Fritz MRN: [id number] Date of Birth: 1951-08-24 Referring Provider:     [place] from 01/04/2017 in [place]  Referring Provider  Daneen Schick MD       Number of Visits: 7  Reason for Discharge:  Early Exit:  Personal, pt chose to no longer participate in program.   Smoking History:  History  Smoking Status  . Never Smoker  Smokeless Tobacco  . Never Used    Diagnosis:  No diagnosis found.  ADL UCSD:   Initial Exercise Prescription:     Initial Exercise Prescription - 01/04/17 1300      Treadmill   MPH 2.3   Grade 1   Minutes 10   METs 3.08     Bike   Level 0.8   Minutes 10   METs 2.37     NuStep   Level 2   SPM 85   Minutes 10   METs 2.2     Prescription Details   Frequency (times per week) 5   Duration Progress to 45 minutes of aerobic exercise without signs/symptoms of physical distress     Intensity   THRR 40-80% of Max Heartrate 61-122   Ratings of Perceived Exertion 11-13   Perceived Dyspnea 0-4     Progression   Progression Continue to progress workloads to maintain intensity without signs/symptoms of physical distress.     Resistance Training   Training Prescription Yes   Weight 2   Reps 10-15      Discharge Exercise Prescription (Final Exercise Prescription Changes):     Exercise Prescription Changes - 01/24/17 0800      Response to Exercise   Blood Pressure (Admit) 110/70   Blood Pressure (Exercise) 140/84   Blood Pressure (Exit) 120/70   Heart Rate (Admit) 91 bpm   Heart Rate (Exercise) 117 bpm   Heart Rate (Exit) 83 bpm   Rating of Perceived Exertion (Exercise) 12   Symptoms none   Duration Continue with 30 min of aerobic exercise without signs/symptoms of physical distress.   Intensity THRR unchanged     Progression   Average METs 2.8     Resistance Training   Training Prescription Yes   Weight 5lbs   Reps 10-15   Time 10 Minutes     Treadmill   MPH 2.5   Grade 2   Minutes 10   METs 3.6     Bike   Level 1.5   Minutes 10   METs 2.37     NuStep   Level 4   SPM 90   Minutes 10   METs 2.5     Home Exercise Plan   Plans to continue exercise at Home (comment)   Frequency Add 3 additional days to program exercise sessions.   Initial Home Exercises Provided 01/19/17      Functional Capacity:     6 Minute Walk    Row Name 01/04/17 1225         6 Minute Walk   Phase Initial     Distance 1500 feet     Walk Time 6 minutes     # of Rest Breaks 0     MPH 2.84     METS 2.92     RPE 7     VO2 Peak 10.23     Symptoms No     Resting HR 58 bpm  Resting BP 124/80     Max Ex. HR 99 bpm     Max Ex. BP 126/78     2 Minute Post BP 114/76        Psychological, QOL, Others - Outcomes: PHQ 2/9: No flowsheet data found.  Quality of Life:     Quality of Life - 01/12/17 1059      Quality of Life Scores   Health/Function Pre 25.3 %  QOL scores reviewed.  pt c/o chronic back pain which limits his ability to participate in some activities.  pt has learned his limitations and how to adapt.    Socioeconomic Pre 24.92 %   Psych/Spiritual Pre 25.36 %   Family Pre 22.2 %  pt has excellent family relations, loving spouse and very active in taking care of his great grandchild.   GLOBAL Pre 24.77 %      Personal Goals: Goals established at orientation with interventions provided to work toward goal.     Personal Goals and Risk Factors at Admission - 01/04/17 0845      Core Components/Risk Factors/Patient Goals on Admission    Weight Management Yes;Obesity;Weight Loss;Weight Maintenance   Intervention Weight Management: Develop a combined nutrition and exercise program designed to reach desired caloric intake, while maintaining appropriate intake of nutrient and fiber, sodium and fats, and appropriate energy expenditure required for the weight goal.;Weight Management:  Provide education and appropriate resources to help participant work on and attain dietary goals.;Weight Management/Obesity: Establish reasonable short term and long term weight goals.;Obesity: Provide education and appropriate resources to help participant work on and attain dietary goals.   Expected Outcomes Short Term: Continue to assess and modify interventions until short term weight is achieved;Weight Maintenance: Understanding of the daily nutrition guidelines, which includes 25-35% calories from fat, 7% or less cal from saturated fats, less than 200mg  cholesterol, less than 1.5gm of sodium, & 5 or more servings of fruits and vegetables daily;Weight Loss: Understanding of general recommendations for a balanced deficit meal plan, which promotes 1-2 lb weight loss per week and includes a negative energy balance of [phone] kcal/d;Long Term: Adherence to nutrition and physical activity/exercise program aimed toward attainment of established weight goal;Understanding recommendations for meals to include 15-35% energy as protein, 25-35% energy from fat, 35-60% energy from carbohydrates, less than 200mg  of dietary cholesterol, 20-35 gm of total fiber daily;Understanding of distribution of calorie intake throughout the day with the consumption of 4-5 meals/snacks   Lipids Yes   Intervention Provide education and support for participant on nutrition & aerobic/resistive exercise along with prescribed medications to achieve LDL 70mg , HDL >40mg .   Expected Outcomes Short Term: Participant states understanding of desired cholesterol values and is compliant with medications prescribed. Participant is following exercise prescription and nutrition guidelines.;Long Term: Cholesterol controlled with medications as prescribed, with individualized exercise RX and with personalized nutrition plan. Value goals: LDL < 70mg , HDL > 40 mg.   Personal Goal Other Yes       Personal Goals Discharge:   Nutrition & Weight -  Outcomes:     Pre Biometrics - 01/04/17 1336      Pre Biometrics   Waist Circumference (P)  44.5 inches   Hip Circumference (P)  44.25 inches   Waist to Hip Ratio (P)  1.01 %   Triceps Skinfold (P)  23 mm   % Body Fat (P)  34 %   Grip Strength (P)  32.5 kg   Flexibility (P)  9 in   Single  Leg Stand (P)  3.17 seconds       Nutrition:     Nutrition Therapy & Goals - 01/04/17 1506      Nutrition Therapy   Diet Therapeutic Lifestyle Changes     Personal Nutrition Goals   Nutrition Goal Wt loss of 1-2 lb/week to a wt loss goal of 6-24 lb at graduation from [place], educate and counsel regarding individualized specific dietary modifications aiming towards targeted core components such as weight, hypertension, lipid management, diabetes, heart failure and other comorbidities.   Expected Outcomes Short Term Goal: Understand basic principles of dietary content, such as calories, fat, sodium, cholesterol and nutrients.;Long Term Goal: Adherence to prescribed nutrition plan.      Nutrition Discharge:     Nutrition Assessments - 01/17/17 0939      MEDFICTS Scores   Pre Score 69      Education Questionnaire Score:     Knowledge Questionnaire Score - 01/04/17 1033      Knowledge Questionnaire Score   Pre Score 19/24      Goals reviewed with patient; copy given to patient.

## 2017-02-28 ENCOUNTER — Encounter (HOSPITAL_COMMUNITY): Payer: Medicare Other

## 2017-03-02 ENCOUNTER — Encounter (HOSPITAL_COMMUNITY): Payer: Medicare Other

## 2017-03-05 ENCOUNTER — Encounter (HOSPITAL_COMMUNITY): Payer: Medicare Other

## 2017-03-07 ENCOUNTER — Encounter (HOSPITAL_COMMUNITY): Payer: Medicare Other

## 2017-03-09 ENCOUNTER — Encounter (HOSPITAL_COMMUNITY): Payer: Medicare Other

## 2017-03-14 ENCOUNTER — Encounter (HOSPITAL_COMMUNITY): Payer: Medicare Other

## 2017-03-16 ENCOUNTER — Encounter (HOSPITAL_COMMUNITY): Payer: Medicare Other

## 2017-03-19 ENCOUNTER — Encounter (HOSPITAL_COMMUNITY): Payer: Medicare Other

## 2017-03-19 DIAGNOSIS — M064 Inflammatory polyarthropathy: Secondary | ICD-10-CM | POA: Diagnosis not present

## 2017-03-19 DIAGNOSIS — E790 Hyperuricemia without signs of inflammatory arthritis and tophaceous disease: Secondary | ICD-10-CM | POA: Diagnosis not present

## 2017-03-19 DIAGNOSIS — Z79899 Other long term (current) drug therapy: Secondary | ICD-10-CM | POA: Diagnosis not present

## 2017-03-19 DIAGNOSIS — M79643 Pain in unspecified hand: Secondary | ICD-10-CM | POA: Diagnosis not present

## 2017-03-21 ENCOUNTER — Encounter (HOSPITAL_COMMUNITY): Payer: Medicare Other

## 2017-03-23 ENCOUNTER — Encounter (HOSPITAL_COMMUNITY): Payer: Medicare Other

## 2017-03-26 ENCOUNTER — Encounter (HOSPITAL_COMMUNITY): Payer: Medicare Other

## 2017-03-28 ENCOUNTER — Encounter (HOSPITAL_COMMUNITY): Payer: Medicare Other

## 2017-03-30 ENCOUNTER — Encounter (HOSPITAL_COMMUNITY): Payer: Medicare Other

## 2017-04-02 ENCOUNTER — Encounter (HOSPITAL_COMMUNITY): Payer: Medicare Other

## 2017-04-04 ENCOUNTER — Encounter (HOSPITAL_COMMUNITY): Payer: Medicare Other

## 2017-04-06 ENCOUNTER — Encounter (HOSPITAL_COMMUNITY): Payer: Medicare Other

## 2017-04-09 ENCOUNTER — Encounter (HOSPITAL_COMMUNITY): Payer: Medicare Other

## 2017-04-11 ENCOUNTER — Encounter (HOSPITAL_COMMUNITY): Payer: Medicare Other

## 2017-04-13 ENCOUNTER — Encounter (HOSPITAL_COMMUNITY): Payer: Medicare Other

## 2017-04-16 ENCOUNTER — Encounter (HOSPITAL_COMMUNITY): Payer: Medicare Other

## 2017-04-20 ENCOUNTER — Encounter (HOSPITAL_COMMUNITY): Payer: Medicare Other

## 2017-04-23 ENCOUNTER — Encounter (HOSPITAL_COMMUNITY): Payer: Medicare Other

## 2017-04-25 ENCOUNTER — Encounter (HOSPITAL_COMMUNITY): Payer: Medicare Other

## 2017-04-27 ENCOUNTER — Encounter (HOSPITAL_COMMUNITY): Payer: Medicare Other

## 2017-04-30 ENCOUNTER — Encounter (HOSPITAL_COMMUNITY): Payer: Medicare Other

## 2017-04-30 DIAGNOSIS — M109 Gout, unspecified: Secondary | ICD-10-CM | POA: Diagnosis not present

## 2017-05-02 ENCOUNTER — Encounter (HOSPITAL_COMMUNITY): Payer: Medicare Other

## 2017-05-03 DIAGNOSIS — Z79899 Other long term (current) drug therapy: Secondary | ICD-10-CM | POA: Diagnosis not present

## 2017-05-03 DIAGNOSIS — M79643 Pain in unspecified hand: Secondary | ICD-10-CM | POA: Diagnosis not present

## 2017-05-03 DIAGNOSIS — M064 Inflammatory polyarthropathy: Secondary | ICD-10-CM | POA: Diagnosis not present

## 2017-05-03 DIAGNOSIS — E790 Hyperuricemia without signs of inflammatory arthritis and tophaceous disease: Secondary | ICD-10-CM | POA: Diagnosis not present

## 2017-05-04 ENCOUNTER — Encounter (HOSPITAL_COMMUNITY): Payer: Medicare Other

## 2017-05-31 DIAGNOSIS — M109 Gout, unspecified: Secondary | ICD-10-CM | POA: Diagnosis not present

## 2017-06-28 DIAGNOSIS — Z79899 Other long term (current) drug therapy: Secondary | ICD-10-CM | POA: Diagnosis not present

## 2017-06-28 DIAGNOSIS — E790 Hyperuricemia without signs of inflammatory arthritis and tophaceous disease: Secondary | ICD-10-CM | POA: Diagnosis not present

## 2017-06-28 DIAGNOSIS — M79643 Pain in unspecified hand: Secondary | ICD-10-CM | POA: Diagnosis not present

## 2017-06-28 DIAGNOSIS — M064 Inflammatory polyarthropathy: Secondary | ICD-10-CM | POA: Diagnosis not present

## 2017-06-29 DIAGNOSIS — I2782 Chronic pulmonary embolism: Secondary | ICD-10-CM | POA: Diagnosis not present

## 2017-06-29 DIAGNOSIS — R7301 Impaired fasting glucose: Secondary | ICD-10-CM | POA: Diagnosis not present

## 2017-06-29 DIAGNOSIS — G4733 Obstructive sleep apnea (adult) (pediatric): Secondary | ICD-10-CM | POA: Diagnosis not present

## 2017-06-29 DIAGNOSIS — E7800 Pure hypercholesterolemia, unspecified: Secondary | ICD-10-CM | POA: Diagnosis not present

## 2017-06-29 DIAGNOSIS — M109 Gout, unspecified: Secondary | ICD-10-CM | POA: Diagnosis not present

## 2017-06-29 DIAGNOSIS — N529 Male erectile dysfunction, unspecified: Secondary | ICD-10-CM | POA: Diagnosis not present

## 2017-06-29 DIAGNOSIS — F411 Generalized anxiety disorder: Secondary | ICD-10-CM | POA: Diagnosis not present

## 2017-06-29 DIAGNOSIS — Z1159 Encounter for screening for other viral diseases: Secondary | ICD-10-CM | POA: Diagnosis not present

## 2017-06-29 DIAGNOSIS — I2510 Atherosclerotic heart disease of native coronary artery without angina pectoris: Secondary | ICD-10-CM | POA: Diagnosis not present

## 2017-06-29 DIAGNOSIS — J301 Allergic rhinitis due to pollen: Secondary | ICD-10-CM | POA: Diagnosis not present

## 2017-06-29 DIAGNOSIS — Z125 Encounter for screening for malignant neoplasm of prostate: Secondary | ICD-10-CM | POA: Diagnosis not present

## 2017-06-29 DIAGNOSIS — Z0000 Encounter for general adult medical examination without abnormal findings: Secondary | ICD-10-CM | POA: Diagnosis not present

## 2017-06-29 DIAGNOSIS — Z23 Encounter for immunization: Secondary | ICD-10-CM | POA: Diagnosis not present

## 2017-07-02 ENCOUNTER — Encounter: Payer: Self-pay | Admitting: Neurology

## 2017-07-02 ENCOUNTER — Other Ambulatory Visit: Payer: Self-pay | Admitting: *Deleted

## 2017-07-02 DIAGNOSIS — R202 Paresthesia of skin: Secondary | ICD-10-CM

## 2017-07-19 ENCOUNTER — Ambulatory Visit (INDEPENDENT_AMBULATORY_CARE_PROVIDER_SITE_OTHER): Payer: Medicare Other | Admitting: Neurology

## 2017-07-19 DIAGNOSIS — R202 Paresthesia of skin: Secondary | ICD-10-CM

## 2017-07-19 DIAGNOSIS — G5601 Carpal tunnel syndrome, right upper limb: Secondary | ICD-10-CM

## 2017-07-19 DIAGNOSIS — G5621 Lesion of ulnar nerve, right upper limb: Secondary | ICD-10-CM

## 2017-07-19 NOTE — Procedures (Signed)
[facility]  [address], [address]  [place], [place] [address] Tel: [phone] Fax:  [phone] Test Date:  07/19/2017  Patient: Patrick Fritz, Patrick Fritz. DOB: 1951-10-11 Physician: Narda Amber, DO  Sex: Male Height: 5\' 9"  Ref Phys: Mayra Neer, MD  ID#: [id number] Temp: 32.4C Technician:    Patient Complaints: This is a 66 year old gentleman referred for evaluation of right hand paresthesias and pain.  NCV & EMG Findings: Extensive electrodiagnostic testing of the right upper extremity shows:  1. Right median sensory response shows prolonged distal peak latency (4.7 ms) and reduced amplitude (5.4 V).  Right ulnar sensory response shows showed prolonged distal peak latency (3.5 ms) and normal amplitude. Right radial sensory responses within normal limits. 2. Right median motor response shows prolonged latency  (4.5 ms).  The right ulnar motor nerve shows decreased conduction velocity across the elbow (A Elbow-B Elbow, 42 m/s).   3. There is no evidence of active or chronic motor axon loss changes affecting any of the tested muscles. Motor unit configuration and recruitment pattern is within normal limits.   Impression: 1. Right median neuropathy at or distal to the wrist, consistent with clinical diagnosis of carpal tunnel syndrome. Overall, these findings are moderate-to-severe in degree electrically. 2. Right ulnar neuropathy with slowing across the elbow, purely demyelinating in type.   ___________________________ Narda Amber, DO    Nerve Conduction Studies Anti Sensory Summary Table   Site NR Peak (ms) Norm Peak (ms) P-T Amp (V) Norm P-T Amp  Right Median Anti Sensory (2nd Digit)  Wrist    4.7 <3.8 5.4 >10  Right Radial Anti Sensory (Base 1st Digit)  Wrist    2.1 <2.8 15.1 >10  Right Ulnar Anti Sensory (5th Digit)  Wrist    3.5 <3.2 10.6 >5   Motor Summary Table   Site NR Onset (ms) Norm Onset (ms) O-P Amp (mV) Norm O-P Amp Site1 Site2 Delta-0 (ms)  Dist (cm) Vel (m/s) Norm Vel (m/s)  Right Median Motor (Abd Poll Brev)  Wrist    4.5 <4.0 7.5 >5 Elbow Wrist 5.3 29.0 55 >50  Elbow    9.8  7.1         Right Ulnar Motor (Abd Dig Minimi)  Wrist    2.9 <3.1 9.0 >7 B Elbow Wrist 3.9 24.0 62 >50  B Elbow    6.8  8.3  A Elbow B Elbow 2.4 10.0 42 >50  A Elbow    9.2  6.7          EMG   Side Muscle Ins Act Fibs Psw Fasc Number Recrt Dur Dur. Amp Amp. Poly Poly. Comment  Right 1stDorInt Nml Nml Nml Nml Nml Nml Nml Nml Nml Nml Nml Nml N/A  Right Abd Poll Brev Nml Nml Nml Nml Nml Nml Nml Nml Nml Nml Nml Nml N/A  Right Ext Indicis Nml Nml Nml Nml Nml Nml Nml Nml Nml Nml Nml Nml N/A  Right PronatorTeres Nml Nml Nml Nml Nml Nml Nml Nml Nml Nml Nml Nml N/A  Right Biceps Nml Nml Nml Nml Nml Nml Nml Nml Nml Nml Nml Nml N/A  Right Triceps Nml Nml Nml Nml Nml Nml Nml Nml Nml Nml Nml Nml N/A  Right Deltoid Nml Nml Nml Nml Nml Nml Nml Nml Nml Nml Nml Nml N/A  Right ABD Dig Min Nml Nml Nml Nml Nml Nml Nml Nml Nml Nml Nml Nml N/A  Right FlexCarpiUln Nml Nml Nml Nml Nml Nml Nml Nml Nml  Nml Nml Nml N/A      Waveforms:

## 2017-07-19 NOTE — Progress Notes (Signed)
EMG faxed to [facility].

## 2017-07-20 DIAGNOSIS — M1990 Unspecified osteoarthritis, unspecified site: Secondary | ICD-10-CM | POA: Diagnosis not present

## 2017-07-20 DIAGNOSIS — M064 Inflammatory polyarthropathy: Secondary | ICD-10-CM | POA: Diagnosis not present

## 2017-07-20 DIAGNOSIS — Z79899 Other long term (current) drug therapy: Secondary | ICD-10-CM | POA: Diagnosis not present

## 2017-07-20 DIAGNOSIS — M109 Gout, unspecified: Secondary | ICD-10-CM | POA: Diagnosis not present

## 2017-07-20 DIAGNOSIS — R768 Other specified abnormal immunological findings in serum: Secondary | ICD-10-CM | POA: Diagnosis not present

## 2017-07-20 DIAGNOSIS — M79643 Pain in unspecified hand: Secondary | ICD-10-CM | POA: Diagnosis not present

## 2017-09-17 DIAGNOSIS — M1A09X0 Idiopathic chronic gout, multiple sites, without tophus (tophi): Secondary | ICD-10-CM | POA: Diagnosis not present

## 2017-09-17 DIAGNOSIS — M542 Cervicalgia: Secondary | ICD-10-CM | POA: Diagnosis not present

## 2017-09-17 DIAGNOSIS — G5621 Lesion of ulnar nerve, right upper limb: Secondary | ICD-10-CM | POA: Diagnosis not present

## 2017-09-17 DIAGNOSIS — M180 Bilateral primary osteoarthritis of first carpometacarpal joints: Secondary | ICD-10-CM | POA: Diagnosis not present

## 2017-09-17 DIAGNOSIS — M19041 Primary osteoarthritis, right hand: Secondary | ICD-10-CM | POA: Diagnosis not present

## 2017-09-17 DIAGNOSIS — R200 Anesthesia of skin: Secondary | ICD-10-CM | POA: Diagnosis not present

## 2017-09-17 DIAGNOSIS — G5601 Carpal tunnel syndrome, right upper limb: Secondary | ICD-10-CM | POA: Diagnosis not present

## 2017-09-27 DIAGNOSIS — R768 Other specified abnormal immunological findings in serum: Secondary | ICD-10-CM | POA: Diagnosis not present

## 2017-09-27 DIAGNOSIS — Z79899 Other long term (current) drug therapy: Secondary | ICD-10-CM | POA: Diagnosis not present

## 2017-09-27 DIAGNOSIS — M79643 Pain in unspecified hand: Secondary | ICD-10-CM | POA: Diagnosis not present

## 2017-09-27 DIAGNOSIS — M064 Inflammatory polyarthropathy: Secondary | ICD-10-CM | POA: Diagnosis not present

## 2017-09-27 DIAGNOSIS — M1990 Unspecified osteoarthritis, unspecified site: Secondary | ICD-10-CM | POA: Diagnosis not present

## 2017-09-27 DIAGNOSIS — M109 Gout, unspecified: Secondary | ICD-10-CM | POA: Diagnosis not present

## 2017-12-26 DIAGNOSIS — M1990 Unspecified osteoarthritis, unspecified site: Secondary | ICD-10-CM | POA: Diagnosis not present

## 2017-12-26 DIAGNOSIS — M79643 Pain in unspecified hand: Secondary | ICD-10-CM | POA: Diagnosis not present

## 2017-12-26 DIAGNOSIS — Z5181 Encounter for therapeutic drug level monitoring: Secondary | ICD-10-CM | POA: Diagnosis not present

## 2017-12-26 DIAGNOSIS — M064 Inflammatory polyarthropathy: Secondary | ICD-10-CM | POA: Diagnosis not present

## 2017-12-26 DIAGNOSIS — Z79899 Other long term (current) drug therapy: Secondary | ICD-10-CM | POA: Diagnosis not present

## 2017-12-26 DIAGNOSIS — M359 Systemic involvement of connective tissue, unspecified: Secondary | ICD-10-CM | POA: Diagnosis not present

## 2017-12-26 DIAGNOSIS — G5600 Carpal tunnel syndrome, unspecified upper limb: Secondary | ICD-10-CM | POA: Diagnosis not present

## 2017-12-26 DIAGNOSIS — M109 Gout, unspecified: Secondary | ICD-10-CM | POA: Diagnosis not present

## 2018-01-23 DIAGNOSIS — G5600 Carpal tunnel syndrome, unspecified upper limb: Secondary | ICD-10-CM | POA: Diagnosis not present

## 2018-01-23 DIAGNOSIS — M1990 Unspecified osteoarthritis, unspecified site: Secondary | ICD-10-CM | POA: Diagnosis not present

## 2018-01-23 DIAGNOSIS — M359 Systemic involvement of connective tissue, unspecified: Secondary | ICD-10-CM | POA: Diagnosis not present

## 2018-01-23 DIAGNOSIS — M79643 Pain in unspecified hand: Secondary | ICD-10-CM | POA: Diagnosis not present

## 2018-01-23 DIAGNOSIS — Z79899 Other long term (current) drug therapy: Secondary | ICD-10-CM | POA: Diagnosis not present

## 2018-01-23 DIAGNOSIS — M064 Inflammatory polyarthropathy: Secondary | ICD-10-CM | POA: Diagnosis not present

## 2018-01-23 DIAGNOSIS — M109 Gout, unspecified: Secondary | ICD-10-CM | POA: Diagnosis not present

## 2018-01-29 DIAGNOSIS — Z6835 Body mass index (BMI) 35.0-35.9, adult: Secondary | ICD-10-CM | POA: Diagnosis not present

## 2018-01-29 DIAGNOSIS — R7301 Impaired fasting glucose: Secondary | ICD-10-CM | POA: Diagnosis not present

## 2018-01-29 DIAGNOSIS — I2510 Atherosclerotic heart disease of native coronary artery without angina pectoris: Secondary | ICD-10-CM | POA: Diagnosis not present

## 2018-01-29 DIAGNOSIS — M069 Rheumatoid arthritis, unspecified: Secondary | ICD-10-CM | POA: Diagnosis not present

## 2018-01-29 DIAGNOSIS — E669 Obesity, unspecified: Secondary | ICD-10-CM | POA: Diagnosis not present

## 2018-01-29 DIAGNOSIS — E7800 Pure hypercholesterolemia, unspecified: Secondary | ICD-10-CM | POA: Diagnosis not present

## 2018-02-07 DIAGNOSIS — M5416 Radiculopathy, lumbar region: Secondary | ICD-10-CM | POA: Diagnosis not present

## 2018-02-13 DIAGNOSIS — Z79899 Other long term (current) drug therapy: Secondary | ICD-10-CM | POA: Diagnosis not present

## 2018-02-13 DIAGNOSIS — M064 Inflammatory polyarthropathy: Secondary | ICD-10-CM | POA: Diagnosis not present

## 2018-03-06 DIAGNOSIS — M109 Gout, unspecified: Secondary | ICD-10-CM | POA: Diagnosis not present

## 2018-03-06 DIAGNOSIS — G5600 Carpal tunnel syndrome, unspecified upper limb: Secondary | ICD-10-CM | POA: Diagnosis not present

## 2018-03-06 DIAGNOSIS — M064 Inflammatory polyarthropathy: Secondary | ICD-10-CM | POA: Diagnosis not present

## 2018-03-06 DIAGNOSIS — M1990 Unspecified osteoarthritis, unspecified site: Secondary | ICD-10-CM | POA: Diagnosis not present

## 2018-03-06 DIAGNOSIS — M79643 Pain in unspecified hand: Secondary | ICD-10-CM | POA: Diagnosis not present

## 2018-03-06 DIAGNOSIS — M359 Systemic involvement of connective tissue, unspecified: Secondary | ICD-10-CM | POA: Diagnosis not present

## 2018-03-06 DIAGNOSIS — Z79899 Other long term (current) drug therapy: Secondary | ICD-10-CM | POA: Diagnosis not present

## 2018-04-11 DIAGNOSIS — Z79899 Other long term (current) drug therapy: Secondary | ICD-10-CM | POA: Diagnosis not present

## 2018-04-11 DIAGNOSIS — M1990 Unspecified osteoarthritis, unspecified site: Secondary | ICD-10-CM | POA: Diagnosis not present

## 2018-04-11 DIAGNOSIS — M359 Systemic involvement of connective tissue, unspecified: Secondary | ICD-10-CM | POA: Diagnosis not present

## 2018-04-11 DIAGNOSIS — M109 Gout, unspecified: Secondary | ICD-10-CM | POA: Diagnosis not present

## 2018-04-11 DIAGNOSIS — G5600 Carpal tunnel syndrome, unspecified upper limb: Secondary | ICD-10-CM | POA: Diagnosis not present

## 2018-04-11 DIAGNOSIS — M064 Inflammatory polyarthropathy: Secondary | ICD-10-CM | POA: Diagnosis not present

## 2018-04-11 DIAGNOSIS — M79643 Pain in unspecified hand: Secondary | ICD-10-CM | POA: Diagnosis not present

## 2018-05-01 DIAGNOSIS — E782 Mixed hyperlipidemia: Secondary | ICD-10-CM | POA: Diagnosis not present

## 2018-07-11 DIAGNOSIS — Z23 Encounter for immunization: Secondary | ICD-10-CM | POA: Diagnosis not present

## 2018-07-11 DIAGNOSIS — I2510 Atherosclerotic heart disease of native coronary artery without angina pectoris: Secondary | ICD-10-CM | POA: Diagnosis not present

## 2018-07-11 DIAGNOSIS — E669 Obesity, unspecified: Secondary | ICD-10-CM | POA: Diagnosis not present

## 2018-07-11 DIAGNOSIS — R7301 Impaired fasting glucose: Secondary | ICD-10-CM | POA: Diagnosis not present

## 2018-07-11 DIAGNOSIS — E7800 Pure hypercholesterolemia, unspecified: Secondary | ICD-10-CM | POA: Diagnosis not present

## 2018-07-11 DIAGNOSIS — M069 Rheumatoid arthritis, unspecified: Secondary | ICD-10-CM | POA: Diagnosis not present

## 2018-07-11 DIAGNOSIS — R600 Localized edema: Secondary | ICD-10-CM | POA: Diagnosis not present

## 2018-07-11 DIAGNOSIS — F411 Generalized anxiety disorder: Secondary | ICD-10-CM | POA: Diagnosis not present

## 2018-07-11 DIAGNOSIS — R202 Paresthesia of skin: Secondary | ICD-10-CM | POA: Diagnosis not present

## 2018-07-11 DIAGNOSIS — I2782 Chronic pulmonary embolism: Secondary | ICD-10-CM | POA: Diagnosis not present

## 2018-07-11 DIAGNOSIS — Z125 Encounter for screening for malignant neoplasm of prostate: Secondary | ICD-10-CM | POA: Diagnosis not present

## 2018-07-11 DIAGNOSIS — Z0000 Encounter for general adult medical examination without abnormal findings: Secondary | ICD-10-CM | POA: Diagnosis not present

## 2018-07-12 DIAGNOSIS — M064 Inflammatory polyarthropathy: Secondary | ICD-10-CM | POA: Diagnosis not present

## 2018-07-12 DIAGNOSIS — M79645 Pain in left finger(s): Secondary | ICD-10-CM | POA: Diagnosis not present

## 2018-07-12 DIAGNOSIS — G5600 Carpal tunnel syndrome, unspecified upper limb: Secondary | ICD-10-CM | POA: Diagnosis not present

## 2018-07-12 DIAGNOSIS — M359 Systemic involvement of connective tissue, unspecified: Secondary | ICD-10-CM | POA: Diagnosis not present

## 2018-07-12 DIAGNOSIS — M79643 Pain in unspecified hand: Secondary | ICD-10-CM | POA: Diagnosis not present

## 2018-07-12 DIAGNOSIS — M1990 Unspecified osteoarthritis, unspecified site: Secondary | ICD-10-CM | POA: Diagnosis not present

## 2018-07-12 DIAGNOSIS — M109 Gout, unspecified: Secondary | ICD-10-CM | POA: Diagnosis not present

## 2018-07-12 DIAGNOSIS — Z79899 Other long term (current) drug therapy: Secondary | ICD-10-CM | POA: Diagnosis not present

## 2018-10-07 DIAGNOSIS — G5600 Carpal tunnel syndrome, unspecified upper limb: Secondary | ICD-10-CM | POA: Diagnosis not present

## 2018-10-07 DIAGNOSIS — M109 Gout, unspecified: Secondary | ICD-10-CM | POA: Diagnosis not present

## 2018-10-07 DIAGNOSIS — M79645 Pain in left finger(s): Secondary | ICD-10-CM | POA: Diagnosis not present

## 2018-10-07 DIAGNOSIS — Z79899 Other long term (current) drug therapy: Secondary | ICD-10-CM | POA: Diagnosis not present

## 2018-10-07 DIAGNOSIS — M359 Systemic involvement of connective tissue, unspecified: Secondary | ICD-10-CM | POA: Diagnosis not present

## 2018-10-07 DIAGNOSIS — M064 Inflammatory polyarthropathy: Secondary | ICD-10-CM | POA: Diagnosis not present

## 2018-10-07 DIAGNOSIS — M1990 Unspecified osteoarthritis, unspecified site: Secondary | ICD-10-CM | POA: Diagnosis not present

## 2018-10-07 DIAGNOSIS — M79643 Pain in unspecified hand: Secondary | ICD-10-CM | POA: Diagnosis not present

## 2019-08-08 ENCOUNTER — Telehealth: Payer: Self-pay | Admitting: *Deleted

## 2019-08-08 NOTE — Telephone Encounter (Signed)
   [facility] Pre-operative Risk Assessment    Request for surgical clearance: PT LAST SEEN 07/12/2016; WILL NEED NEW PT APPT. I WILL HAVE SCHEDULING TEAM CALL PT FOR NEW PT APPT FOR SURGERY CLEARANCE.   1. What type of surgery is being performed? COLONOSCOPY   2. When is this surgery scheduled? 09/15/19   3. What type of clearance is required (medical clearance vs. Pharmacy clearance to hold med vs. Both)? BOTH  4. Are there any medications that need to be held prior to surgery and how long? Patrick Fritz   5. Practice name and name of physician performing surgery? [facility]; DR. Penelope Coop   6. What is your office phone number [phone]    7.   What is your office fax number [phone]  8.   Anesthesia type (None, local, MAC, general) ? NOT LISTED; PROPOFOL ?    Patrick Fritz 08/08/2019, 3:08 PM  _________________________________________________________________   (provider comments below)

## 2019-08-08 NOTE — Telephone Encounter (Signed)
Pt last seen in 2017.  Will need to wait until after Appt with MD to verify current diagnoses and determine correct CHADS2-VASc score.  Also, patient will need CBC as last was in 2017.  SCr 1.06 per [facility] recently.   Please send back for review after MD appointment

## 2019-08-13 NOTE — Telephone Encounter (Signed)
Please change followup with Dr. Tamala Julian to preop clearance. Please also check with patient to see if he has been obtaining blood work at [facility] office, if so, will need a copy of his recent labwork to determine how long to hold Xarelto prior to the procedure.

## 2019-08-13 NOTE — Telephone Encounter (Signed)
Called the patient Patrick Fritz. On the number listed as his work number and his wife, Mrs Keen Boso answered. She is listed on the patient's DPR on file. I informed her that [name] would need to bring with him on his upcoming appointment a copy of his most recent labwork. She stated that she will give him the information and makes sure that he brings with him to his appointment.
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
## 2019-08-20 ENCOUNTER — Encounter (INDEPENDENT_AMBULATORY_CARE_PROVIDER_SITE_OTHER): Payer: Self-pay

## 2019-08-20 ENCOUNTER — Ambulatory Visit: Payer: Medicare Other | Admitting: Interventional Cardiology

## 2019-08-20 ENCOUNTER — Other Ambulatory Visit: Payer: Self-pay

## 2019-08-20 ENCOUNTER — Encounter: Payer: Self-pay | Admitting: Interventional Cardiology

## 2019-08-20 VITALS — BP 108/60 | HR 57 | Ht 69.5 in | Wt 232.4 lb

## 2019-08-20 DIAGNOSIS — Z951 Presence of aortocoronary bypass graft: Secondary | ICD-10-CM | POA: Diagnosis not present

## 2019-08-20 DIAGNOSIS — Z7189 Other specified counseling: Secondary | ICD-10-CM

## 2019-08-20 DIAGNOSIS — G4733 Obstructive sleep apnea (adult) (pediatric): Secondary | ICD-10-CM | POA: Diagnosis not present

## 2019-08-20 DIAGNOSIS — I2699 Other pulmonary embolism without acute cor pulmonale: Secondary | ICD-10-CM

## 2019-08-20 DIAGNOSIS — Z95828 Presence of other vascular implants and grafts: Secondary | ICD-10-CM

## 2019-08-20 NOTE — Telephone Encounter (Signed)
Per Dr. Tamala Julian at pre-op appointment today. Are you ok with this holding recommendation?  "On prophylactic Xarelto therapy long-term.  I have given permission to discontinue Xarelto for 48 hours (2 doses) prior to upcoming colonoscopy.  He should resume the medication based upon instruction from Dr. Penelope Coop especially if polypectomy is performed.  If no polyp removal he can resume Xarelto that evening."

## 2019-08-20 NOTE — Telephone Encounter (Signed)
Pt takes Xarelto due to history of recurrent DVT and bilateral PE (most recently in 2017 around the time of his CABG), ok to hold 2 days as instructed by Dr Tamala Julian.

## 2019-08-20 NOTE — Patient Instructions (Signed)
Medication Instructions:  1) DISCONTINUE Metoprolol   *If you need a refill on your cardiac medications before your next appointment, please call your pharmacy*  Lab Work: None If you have labs (blood work) drawn today and your tests are completely normal, you will receive your results only by: Marland Kitchen MyChart Message (if you have MyChart) OR . A paper copy in the mail If you have any lab test that is abnormal or we need to change your treatment, we will call you to review the results.  Testing/Procedures: None  Follow-Up: At [facility], you and your health needs are our priority.  As part of our continuing mission to provide you with exceptional heart care, we have created designated Provider Care Teams.  These Care Teams include your primary Cardiologist (physician) and Advanced Practice Providers (APPs -  Physician Assistants and Nurse Practitioners) who all work together to provide you with the care you need, when you need it.  Your next appointment:   12 months  The format for your next appointment:   In Person  Provider:   You may see Sinclair Grooms, MD or one of the following Advanced Practice Providers on your designated Care Team:    T[place]itt Merle, NP  Cecilie Kicks, NP  Kathyrn Drown, NP   Other Instructions

## 2019-08-20 NOTE — Telephone Encounter (Signed)
   Primary Cardiologist: Sinclair Grooms, MD  Chart reviewed as part of pre-operative protocol coverage. Given past medical history and time since last visit, based on ACC/AHA guidelines, Patrick Fritz would be at acceptable risk for the planned procedure without further cardiovascular testing.   Per Dr. Tamala Julian, pt is on prophylactic Xarelto therapy long-term. He was given permission to discontinue Xarelto for 48 hours (2 doses) prior to upcoming colonoscopy. He should resume the medication based upon instruction from Dr. Penelope Coop especially if polypectomy is performed. If no polyp removal he can resume Xarelto that evening.  I will route this recommendation to the requesting party via Epic fax function and remove from pre-op pool.  Please call with questions.  Kathyrn Drown, NP 08/20/2019, 3:23 PM

## 2020-08-19 NOTE — Progress Notes (Signed)
Cardiology Office Note:    Date:  08/20/2020   ID:  Patrick Fritz, DOB Jan 01, 1951, MRN [id number]  PCP:  Mayra Neer, MD  Cardiologist:  Sinclair Grooms, MD   Referring MD: Mayra Neer, MD   Chief Complaint  Patient presents with  . Coronary Artery Disease  . Congestive Heart Failure    History of Present Illness:    Patrick Fritz is a 69 y.o. male with a hx of recurrent DVT, history of PE, on chronic Xarelto therapy as prophylaxis against DVT OSA, CAD requiring emergency CABG following 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 Surgery included bypass grafting and replacement of the ascending aorta by Dr. PVT. Right arm numbness post CABG.  Jabin has no cardiac complaints.  He stays very busy.  He has a lot of rental property.  He makes improvements on those.  He keeps a garden.  3 of his grandsons live with him.  He has not been troubled at all by chest pain or cardiac symptoms.  He does state that if he squats and stands after having been squatting for a while he gets slightly lightheaded.  His pulse is irregular.  He is having frequent PACs.  He is not having any palpitations or any awareness of this.  Exertional tolerance is unchanged.  Past Medical History:  Diagnosis Date  . DJD (degenerative joint disease), lumbar   . DVT (deep venous thrombosis) ([place])   . Glaucoma    R eye diminished vision approx 50%  . High cholesterol   . Hyperlipidemia   . Hypertension   . IFG (impaired fasting glucose)   . OSA (obstructive sleep apnea)    Severe w AHI 34/hr on HST no on BiPAP at 19/15cm H2O  . PE (pulmonary embolism)    Second occurance,enknown etiology,lifelong anticoagulation    Past Surgical History:  Procedure Laterality Date  . CARDIAC CATHETERIZATION N/A 05/14/2016   Procedure: Left Heart Cath and Coronary Angiography;  Surgeon: Belva Crome, MD;  Location: [facility];  Service: Cardiovascular;  Laterality: N/A;  .  CORONARY ARTERY BYPASS GRAFT N/A 05/14/2016   Procedure: CORONARY ARTERY BYPASS GRAFTING (CABG) times two with LIMA to LAD and right leg SVG to PDA,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;  Surgeon: Ivin Poot, MD;  Location: [facility];  Service: Open Heart Surgery;  Laterality: N/A;    Current Medications: Current Meds  Medication Sig  . allopurinol (ZYLOPRIM) 300 MG tablet Take 300 mg by mouth daily.   Marland Kitchen aspirin 81 MG tablet Take 81 mg by mouth as directed. Few times a month  . folic acid (FOLVITE) 1 MG tablet Take 1 tablet (1 mg total) by mouth daily.  . furosemide (LASIX) 40 MG tablet Take 1 tablet (40 mg total) by mouth daily.  Marland Kitchen gabapentin (NEURONTIN) 100 MG capsule Take 100 mg by mouth 2 (two) times daily.  . hydroxychloroquine (PLAQUENIL) 200 MG tablet Take 200 mg by mouth daily. Take with food or milk   . methotrexate 2.5 MG tablet Take 1 tablet by mouth daily.  . Multiple Vitamins-Minerals (OCUVITE EYE HEALTH FORMULA PO) Take 1 tablet by mouth daily.  . potassium chloride SA (K-DUR,KLOR-CON) 20 MEQ tablet Take 1 tablet (20 mEq total) by mouth daily.  . rivaroxaban (XARELTO) 20 MG TABS tablet Take 1 tablet (20 mg total) by mouth daily with supper.  . sertraline (ZOLOFT) 50 MG tablet Take 50 mg by mouth daily.  Marland Kitchen  simvastatin (ZOCOR) 20 MG tablet Take 20 mg by mouth daily.  . traMADol (ULTRAM) 50 MG tablet Take one to two (50 mg to 100 mg total) by mouth every six (6) hours as needed for pain.     Allergies:   Penicillins   Social History   Socioeconomic History  . Marital status: Married    Spouse name: Not on file  . Number of children: Not on file  . Years of education: Not on file  . Highest education level: Not on file  Occupational History  . Not on file  Tobacco Use  . Smoking status: Never Smoker  . Smokeless tobacco: Never Used  Substance and Sexual Activity  . Alcohol use: Yes    Comment: 1-2 per week  . Drug use: Not on file  . Sexual activity: Not on  file  Other Topics Concern  . Not on file  Social History Narrative  . Not on file   Social Determinants of Health   Financial Resource Strain:   . Difficulty of Paying Living Expenses: Not on file  Food Insecurity:   . Worried About Charity fundraiser in the Last Year: Not on file  . Ran Out of Food in the Last Year: Not on file  Transportation Needs:   . Lack of Transportation (Medical): Not on file  . Lack of Transportation (Non-Medical): Not on file  Physical Activity:   . Days of Exercise per Week: Not on file  . Minutes of Exercise per Session: Not on file  Stress:   . Feeling of Stress : Not on file  Social Connections:   . Frequency of Communication with Friends and Family: Not on file  . Frequency of Social Gatherings with Friends and Family: Not on file  . Attends Religious Services: Not on file  . Active Member of Clubs or Organizations: Not on file  . Attends Archivist Meetings: Not on file  . Marital Status: Not on file     Family History: The patient's family history includes CAD in his father; Hypertension in his father; Osteoporosis in his mother.  ROS:   Please see the history of present illness.    He is easily.  He is on both aspirin and Xarelto.  We discussed cutting aspirin down to 3 times per week.  Frequently misses his diuretic.  Does not suffer swelling or shortness of breath.  All other systems reviewed and are negative.  EKGs/Labs/Other Studies Reviewed:    The following studies were reviewed today: No recent imaging data.  EKG:  EKG normal sinus rhythm, biatrial abnormality, PACs, poor R wave progression V1 through V4.  Otherwise unremarkable.  Recent Labs: No results found for requested labs within last 8760 hours.  Recent Lipid Panel    Component Value Date/Time   CHOL 179 05/14/2016 1310   TRIG 165 (H) 05/14/2016 1310   HDL 45 05/14/2016 1310   CHOLHDL 4.0 05/14/2016 1310   VLDL 33 05/14/2016 1310   LDLCALC 101 (H)  05/14/2016 1310    Physical Exam:    VS:  BP 132/62   Pulse 76   Ht 5\' 10"  (1.778 m)   Wt 222 lb 12.8 oz (101.1 kg)   SpO2 99%   BMI 31.97 kg/m     Wt Readings from Last 3 Encounters:  08/20/20 222 lb 12.8 oz (101.1 kg)  08/20/19 232 lb 6.4 oz (105.4 kg)  01/04/17 242 lb 4.6 oz (109.9 kg)  GEN: Obese. No acute distress HEENT: Normal NECK: No JVD. LYMPHATICS: No lymphadenopathy CARDIAC: Irregular RRR without murmur, gallop, or edema. VASCULAR:  Normal Pulses. No bruits. RESPIRATORY:  Clear to auscultation without rales, wheezing or rhonchi  ABDOMEN: Soft, non-tender, non-distended, No pulsatile mass, MUSCULOSKELETAL: No deformity  SKIN: Warm and dry NEUROLOGIC:  Alert and oriented x 3 PSYCHIATRIC:  Normal affect   ASSESSMENT:    1. Hx of ascending aorta replacement   2. S/P CABG x 2   3. Bilateral pulmonary embolism ([place])   4. Hyperlipidemia LDL goal <70   5. Chronic anticoagulation   6. Obstructive sleep apnea   7. Morbid obesity ([place])   8. Educated about COVID-19 virus infection    PLAN:    In order of problems listed above:  1. Blood pressure control will be important.  He is only on Lasix.  Will monitor blood pressure on the lower dose of furosemide, 20 mg/day.  Will consider switching over to losartan HCT 50/12.5 mg. 2. No angina.  Secondary prevention is discussed.  LDL is too high.  Has shown a trend over the last 3 determinations of increasing.  The most recent was 2.  We will plan to increase Zocor to 40 mg/day. 3. Continue Xarelto but decrease aspirin to 81 mg 3 days/week. 4. See above relative to the increase intensity of Zocor. 5. See dialogue under pulmonary emboli above 6. He is compliant with CPAP 7. Decrease caloric intake, continue physical activity, cut back on carbohydrates. 8. Vaccinated and boosted.  Has not suffered COVID-19 disease.  Overall education and awareness concerning primary/secondary risk prevention was discussed in detail:  LDL less than 70, hemoglobin A1c less than 7, blood pressure target less than 130/80 mmHg, >150 minutes of moderate aerobic activity per week, avoidance of smoking, weight control (via diet and exercise), and continued surveillance/management of/for obstructive sleep apnea.    Medication Adjustments/Labs and Tests Ordered: Current medicines are reviewed at length with the patient today.  Concerns regarding medicines are outlined above.  No orders of the defined types were placed in this encounter.  No orders of the defined types were placed in this encounter.   There are no Patient Instructions on file for this visit.   Signed, Sinclair Grooms, MD  08/20/2020 8:54 AM    [place]

## 2020-08-20 ENCOUNTER — Encounter: Payer: Self-pay | Admitting: Interventional Cardiology

## 2020-08-20 ENCOUNTER — Other Ambulatory Visit: Payer: Self-pay

## 2020-08-20 ENCOUNTER — Ambulatory Visit: Payer: Medicare Other | Admitting: Interventional Cardiology

## 2020-08-20 VITALS — BP 132/62 | HR 76 | Ht 70.0 in | Wt 222.8 lb

## 2020-08-20 DIAGNOSIS — Z7901 Long term (current) use of anticoagulants: Secondary | ICD-10-CM

## 2020-08-20 DIAGNOSIS — Z95828 Presence of other vascular implants and grafts: Secondary | ICD-10-CM

## 2020-08-20 DIAGNOSIS — I2699 Other pulmonary embolism without acute cor pulmonale: Secondary | ICD-10-CM

## 2020-08-20 DIAGNOSIS — E785 Hyperlipidemia, unspecified: Secondary | ICD-10-CM | POA: Diagnosis not present

## 2020-08-20 DIAGNOSIS — Z951 Presence of aortocoronary bypass graft: Secondary | ICD-10-CM

## 2020-08-20 DIAGNOSIS — Z7189 Other specified counseling: Secondary | ICD-10-CM

## 2020-08-20 DIAGNOSIS — G4733 Obstructive sleep apnea (adult) (pediatric): Secondary | ICD-10-CM

## 2020-08-20 MED ORDER — ASPIRIN EC 81 MG PO TBEC: 81.0000 mg | DELAYED_RELEASE_TABLET | ORAL | 3 refills | Status: DC

## 2020-08-20 MED ORDER — POTASSIUM CHLORIDE CRYS ER 20 MEQ PO TBCR: 20.0000 meq | EXTENDED_RELEASE_TABLET | ORAL | 3 refills | Status: DC

## 2020-08-20 MED ORDER — FUROSEMIDE 40 MG PO TABS: 20.0000 mg | ORAL_TABLET | Freq: Every day | ORAL | 6 refills | Status: DC

## 2020-08-20 MED ORDER — SIMVASTATIN 40 MG PO TABS: 40.0000 mg | ORAL_TABLET | Freq: Every day | ORAL | 3 refills | Status: AC

## 2020-08-20 NOTE — Patient Instructions (Signed)
Medication Instructions:  1) DECREASE Furosemide to 20mg  once daily.  Please check your blood pressure regularly.  Your goal blood pressure is 130/80 or less.  If you notice it running consistently higher than that, please contact the office. 2) DECREASE Potassium Chloride 73meq to once daily on Monday, Wednesday and Friday. 3) DECREASE Aspirin to 81mg  once daily on Monday, Wednesday and Friday. 4) INCREASE Simvastatin to 40mg  once daily  *If you need a refill on your cardiac medications before your next appointment, please call your pharmacy*   Lab Work: Lipid, Liver and BMET in 2 months.  You will need to be fasting for these labs (nothing to eat or drink after midnight except water and black coffee).  If you have labs (blood work) drawn today and your tests are completely normal, you will receive your results only by:  [place] (if you have MyChart) OR  A paper copy in the mail If you have any lab test that is abnormal or we need to change your treatment, we will call you to review the results.   Testing/Procedures: None   Follow-Up: At [facility], you and your health needs are our priority.  As part of our continuing mission to provide you with exceptional heart care, we have created designated Provider Care Teams.  These Care Teams include your primary Cardiologist (physician) and Advanced Practice Providers (APPs -  Physician Assistants and Nurse Practitioners) who all work together to provide you with the care you need, when you need it.  We recommend signing up for the patient portal called "MyChart".  Sign up information is provided on this After Visit Summary.  MyChart is used to connect with patients for Virtual Visits (Telemedicine).  Patients are able to view lab/test results, encounter notes, upcoming appointments, etc.  Non-urgent messages can be sent to your provider as well.   To learn more about what you can do with MyChart, go to [email or web address].     Your next appointment:   12 month(s)  The format for your next appointment:   In Person  Provider:   You may see Sinclair Grooms, MD or one of the following Advanced Practice Providers on your designated Care Team:    Truitt Merle, NP  Cecilie Kicks, NP  Kathyrn Drown, NP    Other Instructions

## 2020-10-21 ENCOUNTER — Other Ambulatory Visit: Payer: Self-pay

## 2020-10-21 ENCOUNTER — Other Ambulatory Visit: Payer: Medicare Other

## 2020-10-21 DIAGNOSIS — Z951 Presence of aortocoronary bypass graft: Secondary | ICD-10-CM | POA: Diagnosis not present

## 2020-10-21 DIAGNOSIS — E785 Hyperlipidemia, unspecified: Secondary | ICD-10-CM

## 2020-10-21 LAB — HEPATIC FUNCTION PANEL
ALT: 13 IU/L (ref 0–44)
AST: 18 IU/L (ref 0–40)
Albumin: 4.2 g/dL (ref 3.7–4.7)
Alkaline Phosphatase: 92 IU/L (ref 44–121)
Bilirubin Total: 0.4 mg/dL (ref 0.0–1.2)
Bilirubin, Direct: 0.12 mg/dL (ref 0.00–0.40)
Total Protein: 6.3 g/dL (ref 6.0–8.5)

## 2020-10-21 LAB — BASIC METABOLIC PANEL
BUN/Creatinine Ratio: 17 (ref 10–24)
BUN: 15 mg/dL (ref 8–27)
CO2: 22 mmol/L (ref 20–29)
Calcium: 9.5 mg/dL (ref 8.6–10.2)
Chloride: 105 mmol/L (ref 96–106)
Creatinine, Ser: 0.89 mg/dL (ref 0.76–1.27)
GFR calc Af Amer: 99 mL/min/{1.73_m2} (ref >59)
GFR calc non Af Amer: 86 mL/min/{1.73_m2} (ref >59)
Glucose: 96 mg/dL (ref 65–99)
Potassium: 4.7 mmol/L (ref 3.5–5.2)
Sodium: 140 mmol/L (ref 134–144)

## 2020-10-21 LAB — LIPID PANEL
Chol/HDL Ratio: 3.4 ratio (ref 0.0–5.0)
Cholesterol, Total: 165 mg/dL (ref 100–199)
HDL: 49 mg/dL (ref >39)
LDL Chol Calc (NIH): 97 mg/dL (ref 0–99)
Triglycerides: 106 mg/dL (ref 0–149)
VLDL Cholesterol Cal: 19 mg/dL (ref 5–40)

## 2020-10-26 ENCOUNTER — Encounter: Payer: Self-pay | Admitting: *Deleted

## 2021-01-22 ENCOUNTER — Emergency Department (HOSPITAL_COMMUNITY): Payer: Medicare Other

## 2021-01-22 ENCOUNTER — Observation Stay (HOSPITAL_COMMUNITY)
Admission: EM | Admit: 2021-01-22 | Discharge: 2021-01-22 | Disposition: A | Discharge status: Home / Self Care | Payer: Medicare Other | Attending: Internal Medicine | Admitting: Internal Medicine

## 2021-01-22 ENCOUNTER — Observation Stay (HOSPITAL_COMMUNITY): Payer: Medicare Other

## 2021-01-22 ENCOUNTER — Encounter (HOSPITAL_COMMUNITY): Payer: Self-pay

## 2021-01-22 ENCOUNTER — Observation Stay (HOSPITAL_BASED_OUTPATIENT_CLINIC_OR_DEPARTMENT_OTHER): Payer: Medicare Other

## 2021-01-22 ENCOUNTER — Other Ambulatory Visit: Payer: Self-pay

## 2021-01-22 DIAGNOSIS — Z951 Presence of aortocoronary bypass graft: Secondary | ICD-10-CM | POA: Insufficient documentation

## 2021-01-22 DIAGNOSIS — I700 Atherosclerosis of aorta: Secondary | ICD-10-CM | POA: Diagnosis present

## 2021-01-22 DIAGNOSIS — G459 Transient cerebral ischemic attack, unspecified: Principal | ICD-10-CM

## 2021-01-22 DIAGNOSIS — Z7901 Long term (current) use of anticoagulants: Secondary | ICD-10-CM | POA: Diagnosis not present

## 2021-01-22 DIAGNOSIS — R471 Dysarthria and anarthria: Secondary | ICD-10-CM | POA: Insufficient documentation

## 2021-01-22 DIAGNOSIS — I2510 Atherosclerotic heart disease of native coronary artery without angina pectoris: Secondary | ICD-10-CM | POA: Diagnosis not present

## 2021-01-22 DIAGNOSIS — Z86711 Personal history of pulmonary embolism: Secondary | ICD-10-CM | POA: Diagnosis not present

## 2021-01-22 DIAGNOSIS — Z86718 Personal history of other venous thrombosis and embolism: Secondary | ICD-10-CM

## 2021-01-22 DIAGNOSIS — R4701 Aphasia: Secondary | ICD-10-CM

## 2021-01-22 DIAGNOSIS — Z20822 Contact with and (suspected) exposure to covid-19: Secondary | ICD-10-CM | POA: Diagnosis not present

## 2021-01-22 DIAGNOSIS — G4733 Obstructive sleep apnea (adult) (pediatric): Secondary | ICD-10-CM | POA: Diagnosis present

## 2021-01-22 DIAGNOSIS — Y900 Blood alcohol level of less than 20 mg/100 ml: Secondary | ICD-10-CM | POA: Diagnosis not present

## 2021-01-22 DIAGNOSIS — M109 Gout, unspecified: Secondary | ICD-10-CM | POA: Diagnosis present

## 2021-01-22 DIAGNOSIS — Z79899 Other long term (current) drug therapy: Secondary | ICD-10-CM | POA: Insufficient documentation

## 2021-01-22 DIAGNOSIS — I2699 Other pulmonary embolism without acute cor pulmonale: Secondary | ICD-10-CM | POA: Diagnosis present

## 2021-01-22 DIAGNOSIS — H409 Unspecified glaucoma: Secondary | ICD-10-CM | POA: Diagnosis present

## 2021-01-22 DIAGNOSIS — R0902 Hypoxemia: Secondary | ICD-10-CM | POA: Diagnosis not present

## 2021-01-22 DIAGNOSIS — I10 Essential (primary) hypertension: Secondary | ICD-10-CM | POA: Diagnosis not present

## 2021-01-22 DIAGNOSIS — Z7982 Long term (current) use of aspirin: Secondary | ICD-10-CM | POA: Diagnosis not present

## 2021-01-22 DIAGNOSIS — E785 Hyperlipidemia, unspecified: Secondary | ICD-10-CM | POA: Diagnosis present

## 2021-01-22 LAB — APTT: aPTT: 33 seconds (ref 24–36)

## 2021-01-22 LAB — CBC
HCT: 40.3 % (ref 39.0–52.0)
Hemoglobin: 13.4 g/dL (ref 13.0–17.0)
MCH: 30.7 pg (ref 26.0–34.0)
MCHC: 33.3 g/dL (ref 30.0–36.0)
MCV: 92.2 fL (ref 80.0–100.0)
Platelets: 191 10*3/uL (ref 150–400)
RBC: 4.37 MIL/uL (ref 4.22–5.81)
RDW: 13.4 % (ref 11.5–15.5)
WBC: 6.8 10*3/uL (ref 4.0–10.5)
nRBC: 0 % (ref 0.0–0.2)

## 2021-01-22 LAB — I-STAT CHEM 8, ED
BUN: 20 mg/dL (ref 8–23)
Calcium, Ion: 1.19 mmol/L (ref 1.15–1.40)
Chloride: 104 mmol/L (ref 98–111)
Creatinine, Ser: 1 mg/dL (ref 0.61–1.24)
Glucose, Bld: 108 mg/dL — ABNORMAL HIGH (ref 70–99)
HCT: 40 % (ref 39.0–52.0)
Hemoglobin: 13.6 g/dL (ref 13.0–17.0)
Potassium: 4.2 mmol/L (ref 3.5–5.1)
Sodium: 139 mmol/L (ref 135–145)
TCO2: 24 mmol/L (ref 22–32)

## 2021-01-22 LAB — COMPREHENSIVE METABOLIC PANEL
ALT: 18 U/L (ref 0–44)
AST: 41 U/L (ref 15–41)
Albumin: 3.8 g/dL (ref 3.5–5.0)
Alkaline Phosphatase: 79 U/L (ref 38–126)
Anion gap: 10 (ref 5–15)
BUN: 17 mg/dL (ref 8–23)
CO2: 24 mmol/L (ref 22–32)
Calcium: 9.4 mg/dL (ref 8.9–10.3)
Chloride: 103 mmol/L (ref 98–111)
Creatinine, Ser: 1.06 mg/dL (ref 0.61–1.24)
GFR, Estimated: >60 mL/min (ref >60)
Glucose, Bld: 116 mg/dL — ABNORMAL HIGH (ref 70–99)
Potassium: 4.8 mmol/L (ref 3.5–5.1)
Sodium: 137 mmol/L (ref 135–145)
Total Bilirubin: 1 mg/dL (ref 0.3–1.2)
Total Protein: 6.7 g/dL (ref 6.5–8.1)

## 2021-01-22 LAB — URINALYSIS, ROUTINE W REFLEX MICROSCOPIC
Bilirubin Urine: NEGATIVE
Glucose, UA: NEGATIVE mg/dL
Hgb urine dipstick: NEGATIVE
Ketones, ur: NEGATIVE mg/dL
Leukocytes,Ua: NEGATIVE
Nitrite: NEGATIVE
Protein, ur: NEGATIVE mg/dL
Specific Gravity, Urine: 1.045 — ABNORMAL HIGH (ref 1.005–1.030)
pH: 5 (ref 5.0–8.0)

## 2021-01-22 LAB — ECHOCARDIOGRAM COMPLETE
Area-P 1/2: 3.99 cm2
Height: 70 in
P 1/2 time: 316 msec
S' Lateral: 4 cm
Weight: 3601.43 oz

## 2021-01-22 LAB — LDL CHOLESTEROL, DIRECT: Direct LDL: 55.6 mg/dL (ref 0–99)

## 2021-01-22 LAB — DIFFERENTIAL
Abs Immature Granulocytes: 0.03 10*3/uL (ref 0.00–0.07)
Basophils Absolute: 0.1 10*3/uL (ref 0.0–0.1)
Basophils Relative: 1 %
Eosinophils Absolute: 0.2 10*3/uL (ref 0.0–0.5)
Eosinophils Relative: 4 %
Immature Granulocytes: 0 %
Lymphocytes Relative: 19 %
Lymphs Abs: 1.3 10*3/uL (ref 0.7–4.0)
Monocytes Absolute: 0.6 10*3/uL (ref 0.1–1.0)
Monocytes Relative: 9 %
Neutro Abs: 4.5 10*3/uL (ref 1.7–7.7)
Neutrophils Relative %: 67 %

## 2021-01-22 LAB — RESP PANEL BY RT-PCR (FLU A&B, COVID) ARPGX2
Influenza A by PCR: NEGATIVE
Influenza B by PCR: NEGATIVE
SARS Coronavirus 2 by RT PCR: NEGATIVE

## 2021-01-22 LAB — RAPID URINE DRUG SCREEN, HOSP PERFORMED
Amphetamines: NOT DETECTED
Barbiturates: NOT DETECTED
Benzodiazepines: NOT DETECTED
Cocaine: NOT DETECTED
Opiates: NOT DETECTED
Tetrahydrocannabinol: NOT DETECTED

## 2021-01-22 LAB — TSH: TSH: 2.532 u[IU]/mL (ref 0.350–4.500)

## 2021-01-22 LAB — ETHANOL: Alcohol, Ethyl (B): <10 mg/dL (ref <10)

## 2021-01-22 LAB — LIPID PANEL
Cholesterol: 184 mg/dL (ref 0–200)
HDL: 34 mg/dL — ABNORMAL LOW (ref >40)
LDL Cholesterol: UNDETERMINED mg/dL (ref 0–99)
Total CHOL/HDL Ratio: 5.4 RATIO
Triglycerides: 472 mg/dL — ABNORMAL HIGH (ref <150)
VLDL: UNDETERMINED mg/dL (ref 0–40)

## 2021-01-22 LAB — PROTIME-INR
INR: 1.9 — ABNORMAL HIGH (ref 0.8–1.2)
Prothrombin Time: 21.2 seconds — ABNORMAL HIGH (ref 11.4–15.2)

## 2021-01-22 LAB — HEMOGLOBIN A1C
Hgb A1c MFr Bld: 5.6 % (ref 4.8–5.6)
Mean Plasma Glucose: 114.02 mg/dL

## 2021-01-22 LAB — CBG MONITORING, ED: Glucose-Capillary: 119 mg/dL — ABNORMAL HIGH (ref 70–99)

## 2021-01-22 MED ORDER — ACETAMINOPHEN 650 MG RE SUPP: 650.0000 mg | Freq: Four times a day (QID) | RECTAL | Status: DC | PRN

## 2021-01-22 MED ORDER — ACETAMINOPHEN 325 MG PO TABS
650.0000 mg | ORAL_TABLET | Freq: Four times a day (QID) | ORAL | Status: DC | PRN
  Administered 2021-01-22: 650 mg via ORAL
  Filled 2021-01-22: qty 2

## 2021-01-22 MED ORDER — RIVAROXABAN 20 MG PO TABS: 20.0000 mg | ORAL_TABLET | Freq: Every day | ORAL | Status: DC

## 2021-01-22 MED ORDER — ONDANSETRON HCL 4 MG/2ML IJ SOLN: 4.0000 mg | Freq: Four times a day (QID) | INTRAMUSCULAR | Status: DC | PRN

## 2021-01-22 MED ORDER — STROKE: EARLY STAGES OF RECOVERY BOOK
Freq: Once | Status: AC
  Filled 2021-01-22: qty 1

## 2021-01-22 MED ORDER — SIMVASTATIN 20 MG PO TABS: 40.0000 mg | ORAL_TABLET | Freq: Every day | ORAL | Status: DC

## 2021-01-22 MED ORDER — FUROSEMIDE 40 MG PO TABS: 20.0000 mg | ORAL_TABLET | ORAL | Status: DC

## 2021-01-22 MED ORDER — ONDANSETRON HCL 4 MG PO TABS: 4.0000 mg | ORAL_TABLET | Freq: Four times a day (QID) | ORAL | Status: DC | PRN

## 2021-01-22 MED ORDER — PHENOL 1.4 % MT LIQD
1.0000 | OROMUCOSAL | Status: DC | PRN
  Administered 2021-01-22: 1 via OROMUCOSAL
  Filled 2021-01-22: qty 177

## 2021-01-22 MED ORDER — METHOCARBAMOL 500 MG PO TABS: 500.0000 mg | ORAL_TABLET | Freq: Four times a day (QID) | ORAL | Status: DC | PRN

## 2021-01-22 MED ORDER — IOHEXOL 350 MG/ML SOLN
100.0000 mL | Freq: Once | INTRAVENOUS | Status: AC | PRN
  Administered 2021-01-22: 100 mL via INTRAVENOUS

## 2021-01-22 NOTE — Discharge Summary (Signed)
Physician Discharge Summary  Patrick Fritz XHB:[id number] DOB: 11/16/1950  PCP: Mayra Neer, MD  Admitted from: Home Discharged to: Home  Admit date: 01/22/2021 Discharge date: 01/22/2021  Recommendations for Outpatient Follow-up:    Follow-up Information    Mayra Neer, MD. Schedule an appointment as soon as possible for a visit in 1 week(s).   Specialty: Family Medicine Why: To be seen with repeat labs (CBC & BMP). Contact information: [address] [place] [address] [phone]        Belva Crome, MD. Schedule an appointment as soon as possible for a visit.   Specialty: Cardiology Why: MDs office will also arrange an office visit to discuss further evaluation of abnormal echocardiogram done in the hospital Contact information: [address] [place] [address] [phone]        Garvin Fila, MD. Schedule an appointment as soon as possible for a visit in 6 week(s).   Specialties: Neurology, Radiology Contact information: [address] [place] [address] [place]: None    Equipment/Devices: None    Discharge Condition: Improved and stable   Code Status: Full Code Diet recommendation:  Discharge Diet Orders (From admission, onward)    Start     Ordered   01/22/21 0000  Diet - low sodium heart healthy        01/22/21 1517           Discharge Diagnoses:  Principal Problem:   TIA (transient ischemic attack) Active Problems:   Obstructive sleep apnea   Bilateral pulmonary embolism (HCC)   Gout   Glaucoma   Hyperlipidemia   CAD (coronary artery disease)   Aortic atherosclerosis ([place])   History of DVT in adulthood   Hypertension   Brief Summary: 70 year old married male with medical history significant for but not limited to recurrent DVT, history of PE on Xarelto, OSA reportedly no longer on CPAP, dyslipidemia, hypertension, CAD requiring  emergency CABG following 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  Surgery included bypass grafting and replacement of the ascending aorta.  He presented to the ED via EMS with complaints of aphasia, dysarthria and confusion.  He was admitted for further evaluation and management.  Neurology was consulted and suspect posterior circulation TIA versus unwitnessed seizure with postictal confusion.  HPI as per H&P as per Dr. Tennis Must on 01/22/2021 as copied below:  "Chief Complaint: AMS.  HPI: Patrick Fritz is a 70 y.o. male with medical history significant of DJD of lumbar spine, frequent lower back pain, history of bilateral PE, history of DVT, glaucoma, hyperlipidemia, hypertension, impaired fasting glucose, OSA no longer on BiPAP, CAD/CABG who was brought to the emergency department via EMS after waking up earlier this morning around 0215 aphasic and dysarthric.  The patient and his wife state that they were visiting the patient's sister, returned home after 54 and then started watching a prerecorded baseball game.  His wife went to the shower and when she returned to watch the game, she asked him about what have happened while she was not watching and he begun to speak strangely.  She thought that he was joking and noticed that his speech returned to his usual baseline.  The patient went to bed around 0120.  Then, around [place] she noticed while sleeping he was not acting normal.  She tried  to wake him up, but he was not waking up easily.  She how-to arousing vigorously for him to wake up and then she noticed that he was aphasic.  She called EMS.  When EMS arrived they also noticed that his speech was slower and he had a mild right facial droop.  However, while in the ambulance on the way to the ED his symptoms mostly disappeared.  He denies headache, vision changes, nausea or emesis.  No focal numbness or weakness.  However, he has postural dizziness in the last few months.   Denies fever, chills, rhinorrhea, but complains of an itchy sore throat, which he attributes to allergies.  No dyspnea, wheezing or hemoptysis.  Denies chest pain, diaphoresis, PND, but states he has had occasional palpitations and pitting edema of the lower extremities.  He denies abdominal pain, diarrhea, constipation, melena or hematochezia.  No dysuria, frequency or hematuria, but complains of nocturia on the days he takes diuretics.  No polyuria, polydipsia, polyphagia or blurred vision.  He has been anxious, as he is a [place] war veteran and the news from the war in [place] has affected him.  ED Course: Initial vital signs were temperature 98.9 F, pulse 86, respiration 19, BP 157/91 mmHg and O2 sat 93% on room air.  Labwork: CBC was normal.  PT 21.2, INR 1.9 and PTT 33.  Alcohol level was normal.  CMP showed a glucose of 116 mg/dL, but all other values were normal (the patient had a snack dessert before going to bed.  Imaging: CT head code stroke did not show any acute intracranial normality.  There was a chronic ischemic insult in the right frontal lobe and right side insular region.  CTA head/neck did not show any emergent large vessel occlusion or high-grade stenosis of the intracranial arteries.  There was bilateral carotid bifurcation atherosclerosis without hemodynamically significant stenosis.  There was atherosclerosis of the aorta.  Please see images and full radiology report for further details."  Assessment and plan:  Posterior circulation TIA versus unwitnessed seizure with postictal confusion  Neurology was consulted and assisted with evaluation and management.  Code stroke CT head without acute abnormality.  Small vessel disease.  Aspects 10.  CTA head and neck: No high-grade stenosis of intracranial arteries and no hemodynamically significant stenosis of bilateral carotids.  CT perfusion study: Negative.  MRI brain: No acute findings, chronic small vessel disease and  small remote frontal lobe infarct.  2D echo: LVEF 30-35%, LV regional wall motion abnormalities (septal and inferior wall hypokinesis.  New from prior.  EEG: As discussed with Dr. Leonie Man and his preliminary read: Generalized slowing but no definite seizure activity.  Direct LDL: 55.6.  A1c: 5.6.  TSH: 2.532.  Symptoms resolved on night of admission without recurrence.  Currently without focal symptoms or deficits.  Ambulated without difficulty with RN and no needs noted for therapies evaluation.  Neurology consultation appreciated.  Discussed with Dr. Leonie Man.  Recommends continuing Xarelto that patient is on for DVT and PE and does not feel there is need to add any aspirin from a stroke standpoint.  Also no indication for AEDs.  No formal driving restrictions but recommended that patient avoid driving for a couple of weeks to make sure that he has no recurrence of events >same was conveyed to patient and spouse who verbalized understanding.    Confirmed with patient and spouse that patient no longer takes aspirin and only takes Xarelto.  However patient reportedly not taking Xarelto at the  same time every day and they were counseled to make sure that he takes it with approximately the same time daily.  They verbalized understanding.  Outpatient follow-up with neurology in 6 weeks.  New Cardiomyopathy  2D echo shows LVEF 30-35% with septal and inferior wall hypokinesis.  No anginal symptoms or signs and symptoms of heart failure.  Discussed in detail with Dr. Johnsie Cancel, Cardiologist on-call who incidentally was also the provider who read the echocardiogram.  In the absence of heart failure symptoms, he recommended close outpatient follow-up with his primary cardiologist and he will arrange.  Updated patient and family regarding this  Hyperlipidemia  Direct LDL 55.6, goal <70  Continue prior home dose of simvastatin.  History of recurrent DVT and history of pulmonary embolism  Assign to  lifelong anticoagulation.  Continue Xarelto but counseled extensively regarding importance of taking it approximately the same time daily.  Spouse also indicated that he occasionally misses a dose when there is delay in getting the Xarelto by mail from the [place] when she substitutes this for with an aspirin.  Advised her that this is not optimal and to try and see if the medications can be obtained prior to running out.  OSA  No longer on CPAP/BiPAP.  Gout  No acute flare.  Continue allopurinol.  CAD s/p emergency CABG and replacement of aortic root  Stable.  Outpatient follow-up with cardiology.  Continue statins.  Not on aspirin.  Essential hypertension:  Mildly uncontrolled.  Not on antihypertensives prior to admission.  Close outpatient follow-up with PCP/cardiology.   Consultations:  Neurology  Procedures:  None   Discharge Instructions  Discharge Instructions    Call MD for:   Complete by: As directed    Recurrent strokelike symptoms, confusion, speech abnormalities or seizure-like activity.   Call MD for:  difficulty breathing, headache or visual disturbances   Complete by: As directed    Call MD for:  extreme fatigue   Complete by: As directed    Call MD for:  persistant dizziness or light-headedness   Complete by: As directed    Diet - low sodium heart healthy   Complete by: As directed    Increase activity slowly   Complete by: As directed        Medication List    STOP taking these medications   aspirin EC 81 MG tablet   folic acid 1 MG tablet Commonly known as: FOLVITE     TAKE these medications   allopurinol 300 MG tablet Commonly known as: ZYLOPRIM Take 300 mg by mouth daily.   diclofenac Sodium 1 % Gel Commonly known as: VOLTAREN Apply 1 application topically daily as needed for pain.   furosemide 40 MG tablet Commonly known as: LASIX Take 0.5 tablets (20 mg total) by mouth 3 (three) times a week. Start taking on: January 24, 2021   gabapentin 100 MG capsule Commonly known as: NEURONTIN Take 100 mg by mouth 2 (two) times daily.   potassium chloride SA 20 MEQ tablet Commonly known as: KLOR-CON Take 1 tablet (20 mEq total) by mouth every Monday, Wednesday, and Friday.   rivaroxaban 20 MG Tabs tablet Commonly known as: Xarelto Take 1 tablet (20 mg total) by mouth daily with supper.   sertraline 50 MG tablet Commonly known as: ZOLOFT Take 150 mg by mouth daily.   simvastatin 40 MG tablet Commonly known as: ZOCOR Take 1 tablet (40 mg total) by mouth at bedtime.   traMADol 50 MG tablet Commonly known  as: ULTRAM Take 50-100 mg by mouth at bedtime.      Allergies  Allergen Reactions  . Penicillins Anaphylaxis    Has patient had a PCN reaction causing immediate rash, facial/tongue/throat swelling, SOB or lightheadedness with hypotension: Yes Has patient had a PCN reaction causing severe rash involving mucus membranes or skin necrosis: No Has patient had a PCN reaction that required hospitalization Yes Has patient had a PCN reaction occurring within the last 10 years: No If all of the above answers are "NO", then may proceed with Cephalosporin use.       Procedures/Studies: CT ANGIO HEAD W OR WO CONTRAST  Result Date: 01/22/2021 CLINICAL DATA:  Aphasia EXAM: CT ANGIOGRAPHY HEAD AND NECK CT PERFUSION BRAIN TECHNIQUE: Multidetector CT imaging of the head and neck was performed using the standard protocol during bolus administration of intravenous contrast. Multiplanar CT image reconstructions and MIPs were obtained to evaluate the vascular anatomy. Carotid stenosis measurements (when applicable) are obtained utilizing NASCET criteria, using the distal internal carotid diameter as the denominator. Multiphase CT imaging of the brain was performed following IV bolus contrast injection. Subsequent parametric perfusion maps were calculated using RAPID software. CONTRAST:  123mL OMNIPAQUE IOHEXOL 350 MG/ML SOLN  COMPARISON:  None. FINDINGS: CTA NECK FINDINGS SKELETON: There is no bony spinal canal stenosis. No lytic or blastic lesion. OTHER NECK: Normal pharynx, larynx and major salivary glands. No cervical lymphadenopathy. Unremarkable thyroid gland. UPPER CHEST: No pneumothorax or pleural effusion. No nodules or masses. AORTIC ARCH: There is web-like atherosclerosis in the ascending aorta. Mild calcific atherosclerosis of the aortic arch. Conventional 3 vessel aortic branching pattern. The visualized proximal subclavian arteries are widely patent. RIGHT CAROTID SYSTEM: Mild atherosclerotic plaque at the carotid bifurcation and proximal ICA without hemodynamically significant stenosis. LEFT CAROTID SYSTEM: No dissection, occlusion or aneurysm. There is mixed density atherosclerosis extending into the proximal ICA, resulting in less than 50% stenosis. VERTEBRAL ARTERIES: Left dominant configuration. Both origins are clearly patent. There is no dissection, occlusion or flow-limiting stenosis to the skull base (V1-V3 segments). CTA HEAD FINDINGS POSTERIOR CIRCULATION: --Vertebral arteries: Normal V4 segments. --Inferior cerebellar arteries: Normal. --Basilar artery: Normal. --Superior cerebellar arteries: Normal. --Posterior cerebral arteries (PCA): Normal. ANTERIOR CIRCULATION: --Intracranial internal carotid arteries: Normal. --Anterior cerebral arteries (ACA): Normal. Both A1 segments are present. Patent anterior communicating artery (a-comm). --Middle cerebral arteries (MCA): Normal. VENOUS SINUSES: As permitted by contrast timing, patent. ANATOMIC VARIANTS: None Review of the MIP images confirms the above findings. CT Brain Perfusion Findings: ASPECTS: 10 CBF (<30%) Volume: 74mL Perfusion (Tmax>6.0s) volume: 30mL Mismatch Volume: 65mL Infarction Location:None IMPRESSION: 1. No emergent large vessel occlusion or high-grade stenosis of the intracranial arteries. 2. Bilateral carotid bifurcation atherosclerosis without  hemodynamically significant stenosis by NASCET criteria. 3. Web-like atherosclerosis in the ascending aorta. Aortic Atherosclerosis (ICD10-I70.0). Electronically Signed   By: Ulyses Jarred M.D.   On: 01/22/2021 03:56   CT ANGIO NECK W OR WO CONTRAST  Result Date: 01/22/2021 CLINICAL DATA:  Aphasia EXAM: CT ANGIOGRAPHY HEAD AND NECK CT PERFUSION BRAIN TECHNIQUE: Multidetector CT imaging of the head and neck was performed using the standard protocol during bolus administration of intravenous contrast. Multiplanar CT image reconstructions and MIPs were obtained to evaluate the vascular anatomy. Carotid stenosis measurements (when applicable) are obtained utilizing NASCET criteria, using the distal internal carotid diameter as the denominator. Multiphase CT imaging of the brain was performed following IV bolus contrast injection. Subsequent parametric perfusion maps were calculated using RAPID software. CONTRAST:  129mL OMNIPAQUE  IOHEXOL 350 MG/ML SOLN COMPARISON:  None. FINDINGS: CTA NECK FINDINGS SKELETON: There is no bony spinal canal stenosis. No lytic or blastic lesion. OTHER NECK: Normal pharynx, larynx and major salivary glands. No cervical lymphadenopathy. Unremarkable thyroid gland. UPPER CHEST: No pneumothorax or pleural effusion. No nodules or masses. AORTIC ARCH: There is web-like atherosclerosis in the ascending aorta. Mild calcific atherosclerosis of the aortic arch. Conventional 3 vessel aortic branching pattern. The visualized proximal subclavian arteries are widely patent. RIGHT CAROTID SYSTEM: Mild atherosclerotic plaque at the carotid bifurcation and proximal ICA without hemodynamically significant stenosis. LEFT CAROTID SYSTEM: No dissection, occlusion or aneurysm. There is mixed density atherosclerosis extending into the proximal ICA, resulting in less than 50% stenosis. VERTEBRAL ARTERIES: Left dominant configuration. Both origins are clearly patent. There is no dissection, occlusion or  flow-limiting stenosis to the skull base (V1-V3 segments). CTA HEAD FINDINGS POSTERIOR CIRCULATION: --Vertebral arteries: Normal V4 segments. --Inferior cerebellar arteries: Normal. --Basilar artery: Normal. --Superior cerebellar arteries: Normal. --Posterior cerebral arteries (PCA): Normal. ANTERIOR CIRCULATION: --Intracranial internal carotid arteries: Normal. --Anterior cerebral arteries (ACA): Normal. Both A1 segments are present. Patent anterior communicating artery (a-comm). --Middle cerebral arteries (MCA): Normal. VENOUS SINUSES: As permitted by contrast timing, patent. ANATOMIC VARIANTS: None Review of the MIP images confirms the above findings. CT Brain Perfusion Findings: ASPECTS: 10 CBF (<30%) Volume: 46mL Perfusion (Tmax>6.0s) volume: 52mL Mismatch Volume: 60mL Infarction Location:None IMPRESSION: 1. No emergent large vessel occlusion or high-grade stenosis of the intracranial arteries. 2. Bilateral carotid bifurcation atherosclerosis without hemodynamically significant stenosis by NASCET criteria. 3. Web-like atherosclerosis in the ascending aorta. Aortic Atherosclerosis (ICD10-I70.0). Electronically Signed   By: Ulyses Jarred M.D.   On: 01/22/2021 03:56   MR BRAIN WO CONTRAST  Result Date: 01/22/2021 CLINICAL DATA:  TIA.  Aphasia EXAM: MRI HEAD WITHOUT CONTRAST TECHNIQUE: Multiplanar, multiecho pulse sequences of the brain and surrounding structures were obtained without intravenous contrast. COMPARISON:  CT and CTA from earlier today FINDINGS: Brain: No acute infarction, hemorrhage, hydrocephalus, extra-axial collection or mass lesion. Chronic small vessel ischemia in the cerebral white matter to a mild degree. Small remote right frontal infarct affecting anterior cortex and subjacent white matter. Chronic right basal ganglia lacunes. Small remote left cerebellar infarction. Remote micro hemorrhages in the left centrum semiovale, right cerebellum, and left low parietal lobe. No generalized cortical  involvement for amyloid angiopathy. Vascular: Preserved flow voids. Notable ICA tortuosity below the skull base. Skull and upper cervical spine: No focal marrow lesion Sinuses/Orbits: Negative IMPRESSION: 1. No acute finding. 2. Chronic small vessel disease and small remote right frontal lobe infarct. Electronically Signed   By: Monte Fantasia M.D.   On: 01/22/2021 08:33   CT CEREBRAL PERFUSION W CONTRAST  Result Date: 01/22/2021 CLINICAL DATA:  Aphasia EXAM: CT ANGIOGRAPHY HEAD AND NECK CT PERFUSION BRAIN TECHNIQUE: Multidetector CT imaging of the head and neck was performed using the standard protocol during bolus administration of intravenous contrast. Multiplanar CT image reconstructions and MIPs were obtained to evaluate the vascular anatomy. Carotid stenosis measurements (when applicable) are obtained utilizing NASCET criteria, using the distal internal carotid diameter as the denominator. Multiphase CT imaging of the brain was performed following IV bolus contrast injection. Subsequent parametric perfusion maps were calculated using RAPID software. CONTRAST:  122mL OMNIPAQUE IOHEXOL 350 MG/ML SOLN COMPARISON:  None. FINDINGS: CTA NECK FINDINGS SKELETON: There is no bony spinal canal stenosis. No lytic or blastic lesion. OTHER NECK: Normal pharynx, larynx and major salivary glands. No cervical lymphadenopathy. Unremarkable thyroid gland. UPPER  CHEST: No pneumothorax or pleural effusion. No nodules or masses. AORTIC ARCH: There is web-like atherosclerosis in the ascending aorta. Mild calcific atherosclerosis of the aortic arch. Conventional 3 vessel aortic branching pattern. The visualized proximal subclavian arteries are widely patent. RIGHT CAROTID SYSTEM: Mild atherosclerotic plaque at the carotid bifurcation and proximal ICA without hemodynamically significant stenosis. LEFT CAROTID SYSTEM: No dissection, occlusion or aneurysm. There is mixed density atherosclerosis extending into the proximal ICA,  resulting in less than 50% stenosis. VERTEBRAL ARTERIES: Left dominant configuration. Both origins are clearly patent. There is no dissection, occlusion or flow-limiting stenosis to the skull base (V1-V3 segments). CTA HEAD FINDINGS POSTERIOR CIRCULATION: --Vertebral arteries: Normal V4 segments. --Inferior cerebellar arteries: Normal. --Basilar artery: Normal. --Superior cerebellar arteries: Normal. --Posterior cerebral arteries (PCA): Normal. ANTERIOR CIRCULATION: --Intracranial internal carotid arteries: Normal. --Anterior cerebral arteries (ACA): Normal. Both A1 segments are present. Patent anterior communicating artery (a-comm). --Middle cerebral arteries (MCA): Normal. VENOUS SINUSES: As permitted by contrast timing, patent. ANATOMIC VARIANTS: None Review of the MIP images confirms the above findings. CT Brain Perfusion Findings: ASPECTS: 10 CBF (<30%) Volume: 73mL Perfusion (Tmax>6.0s) volume: 67mL Mismatch Volume: 68mL Infarction Location:None IMPRESSION: 1. No emergent large vessel occlusion or high-grade stenosis of the intracranial arteries. 2. Bilateral carotid bifurcation atherosclerosis without hemodynamically significant stenosis by NASCET criteria. 3. Web-like atherosclerosis in the ascending aorta. Aortic Atherosclerosis (ICD10-I70.0). Electronically Signed   By: Ulyses Jarred M.D.   On: 01/22/2021 03:56   ECHOCARDIOGRAM COMPLETE  Result Date: 01/22/2021    ECHOCARDIOGRAM REPORT   Patient Name:   Patrick Fritz Date of Exam: 01/22/2021 Medical Rec #:  [id number]        Height:       70.0 in Accession #:    [id number]       Weight:       225.1 lb Date of Birth:  April 28, 1951        BSA:          2.195 m Patient Age:    70 years         BP:           151/84 mmHg Patient Gender: M                HR:           72 bpm. Exam Location:  Inpatient Procedure: 2D Echo Indications:    TIA  History:        Patient has prior history of Echocardiogram examinations, most                 recent 05/19/2016. CAD, Prior  CABG and ascending aorta                 replacement; Risk Factors:Hypertension and Dyslipidemia.  Sonographer:    Johny Chess Referring Phys: [id number] [place]  1. Septal and inferior wall hypokinesis . Left ventricular ejection fraction, by estimation, is 30 to 35%. The left ventricle has moderately decreased function. The left ventricle demonstrates regional wall motion abnormalities (see scoring diagram/findings for description). The left ventricular internal cavity size was mildly dilated. Left ventricular diastolic parameters were normal.  2. Right ventricular systolic function is normal. The right ventricular size is normal. There is normal pulmonary artery systolic pressure.  3. Left atrial size was moderately dilated.  4. The mitral valve is abnormal. Mild mitral valve regurgitation. No evidence of mitral stenosis.  5. The aortic valve is tricuspid. There is moderate calcification of  the aortic valve. Aortic valve regurgitation is mild. Mild aortic valve sclerosis is present, with no evidence of aortic valve stenosis.  6. The inferior vena cava is normal in size with greater than 50% respiratory variability, suggesting right atrial pressure of 3 mmHg. FINDINGS  Left Ventricle: Septal and inferior wall hypokinesis. Left ventricular ejection fraction, by estimation, is 30 to 35%. The left ventricle has moderately decreased function. The left ventricle demonstrates regional wall motion abnormalities. The left ventricular internal cavity size was mildly dilated. There is no left ventricular hypertrophy. Left ventricular diastolic parameters were normal. Right Ventricle: The right ventricular size is normal. No increase in right ventricular wall thickness. Right ventricular systolic function is normal. There is normal pulmonary artery systolic pressure. The tricuspid regurgitant velocity is 2.84 m/s, and  with an assumed right atrial pressure of 3 mmHg, the estimated right ventricular  systolic pressure is 32.9 mmHg. Left Atrium: Left atrial size was moderately dilated. Right Atrium: Right atrial size was normal in size. Pericardium: There is no evidence of pericardial effusion. Mitral Valve: The mitral valve is abnormal. There is mild thickening of the mitral valve leaflet(s). There is mild calcification of the mitral valve leaflet(s). Mild mitral annular calcification. Mild mitral valve regurgitation. No evidence of mitral valve stenosis. Tricuspid Valve: The tricuspid valve is normal in structure. Tricuspid valve regurgitation is mild . No evidence of tricuspid stenosis. Aortic Valve: The aortic valve is tricuspid. There is moderate calcification of the aortic valve. Aortic valve regurgitation is mild. Aortic regurgitation PHT measures 316 msec. Mild aortic valve sclerosis is present, with no evidence of aortic valve stenosis. Pulmonic Valve: The pulmonic valve was normal in structure. Pulmonic valve regurgitation is not visualized. No evidence of pulmonic stenosis. Aorta: The aortic root is normal in size and structure. Venous: The inferior vena cava is normal in size with greater than 50% respiratory variability, suggesting right atrial pressure of 3 mmHg. IAS/Shunts: The interatrial septum was not well visualized.  LEFT VENTRICLE PLAX 2D LVIDd:         5.00 cm  Diastology LVIDs:         4.00 cm  LV e' lateral:   11.70 cm/s LV PW:         1.10 cm  LV E/e' lateral: 6.9 LV IVS:        1.10 cm LVOT diam:     2.10 cm LV SV:         63 LV SV Index:   29 LVOT Area:     3.46 cm  RIGHT VENTRICLE RV S prime:     9.25 cm/s TAPSE (M-mode): 2.4 cm LEFT ATRIUM              Index       RIGHT ATRIUM           Index LA diam:        4.90 cm  2.23 cm/m  RA Area:     19.00 cm LA Vol (A2C):   79.6 ml  36.27 ml/m RA Volume:   50.50 ml  23.01 ml/m LA Vol (A4C):   102.0 ml 46.47 ml/m LA Biplane Vol: 98.0 ml  44.65 ml/m  AORTIC VALVE LVOT Vmax:   97.30 cm/s LVOT Vmean:  61.300 cm/s LVOT VTI:    0.183 m AI  PHT:      316 msec  AORTA Ao Root diam: 3.60 cm MITRAL VALVE  TRICUSPID VALVE MV Area (PHT): 3.99 cm    TR Peak grad:   32.3 mmHg MV Decel Time: 190 msec    TR Vmax:        284.00 cm/s MV E velocity: 81.00 cm/s MV A velocity: 54.00 cm/s  SHUNTS MV E/A ratio:  1.50        Systemic VTI:  0.18 m                            Systemic Diam: 2.10 cm Jenkins Rouge MD Electronically signed by Jenkins Rouge MD Signature Date/Time: 01/22/2021/11:31:45 AM    Final    CT HEAD CODE STROKE WO CONTRAST  Result Date: 01/22/2021 CLINICAL DATA:  Code stroke.  Aphasia and dysarthria EXAM: CT HEAD WITHOUT CONTRAST TECHNIQUE: Contiguous axial images were obtained from the base of the skull through the vertex without intravenous contrast. COMPARISON:  None. FINDINGS: Brain: There is no mass, hemorrhage or extra-axial collection. The size and configuration of the ventricles and extra-axial CSF spaces are normal. Subcortical hypoattenuation within the right frontal lobe, likely a chronic ischemic insult. 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 Vascular: No abnormal hyperdensity of the major intracranial arteries or dural venous sinuses. No intracranial atherosclerosis. Skull: The visualized skull base, calvarium and extracranial soft tissues are normal. Sinuses/Orbits: No fluid levels or advanced mucosal thickening of the visualized paranasal sinuses. No mastoid or middle ear effusion. The orbits are normal. ASPECTS [facility] Stroke Program Early CT Score) - Ganglionic level infarction (caudate, lentiform nuclei, internal capsule, insula, M1-M3 cortex): 7 - Supraganglionic infarction (M4-M6 cortex): 3 Total score (0-10 with 10 being normal): 10 IMPRESSION: 1. No acute intracranial abnormality. 2. ASPECTS is 10. 3. 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 These results were communicated to Dr. Lesleigh Noe at 3:32 am on 01/22/2021 by text page via the [facility] messaging system.  Electronically Signed   By: Ulyses Jarred M.D.   On: 01/22/2021 03:33      Subjective: Patient interviewed and examined along with spouse at bedside and his RN in the room.  Patient denies complaints except ongoing sore throat for a few days which the spouse also has.  They wonder if it is due to allergies.  No strokelike symptoms.  Spouse reports brief couple seconds of speech difficulties when initially in the ED but no recurrence since.  She indicates that his speech and mental status is back to baseline.  Discharge Exam:  Vitals:   01/22/21 0600 01/22/21 0857 01/22/21 1128 01/22/21 1517  BP:  (!) 151/84 (!) 144/80 119/71  Pulse:  77 84 83  Resp:  18 18 18   Temp:  98.4 F (36.9 C) 98.6 F (37 C) 98.3 F (36.8 C)  TempSrc:  Oral Oral Oral  SpO2: 92%   99%  Weight:      Height:        General: Pleasant elderly male, moderately built and nourished lying comfortably propped up in bed without distress.  Oral mucosa moist. ENT: No acute abnormalities in the posterior pharynx.  No erythema or exudates noted. Cardiovascular: S1 & S2 heard, RRR, S1/S2 +. No murmurs, rubs, gallops or clicks. No JVD or pedal edema.  Telemetry personally reviewed: Sinus rhythm with BBB morphology. Respiratory: Clear to auscultation without wheezing, rhonchi or crackles. No increased work of breathing. Abdominal:  Non distended, non tender & soft. No organomegaly or masses appreciated. Normal bowel sounds  heard. CNS: Alert and oriented. No focal deficits. Extremities: no edema, no cyanosis    The results of significant diagnostics from this hospitalization (including imaging, microbiology, ancillary and laboratory) are listed below for reference.     Microbiology: Recent Results (from the past 240 hour(s))  Resp Panel by RT-PCR (Flu A&B, Covid) Nasopharyngeal Swab     Status: None   Collection Time: 01/22/21  4:55 AM   Specimen: Nasopharyngeal Swab; Nasopharyngeal(NP) swabs in vial transport medium   Result Value Ref Range Status   SARS Coronavirus 2 by RT PCR NEGATIVE NEGATIVE Final    Comment: (NOTE) SARS-CoV-2 target nucleic acids are NOT DETECTED.  The SARS-CoV-2 RNA is generally detectable in upper respiratory specimens during the acute phase of infection. The lowest concentration of SARS-CoV-2 viral copies this assay can detect is 138 copies/mL. A negative result does not preclude SARS-Cov-2 infection and should not be used as the sole basis for treatment or other patient management decisions. A negative result may occur with  improper specimen collection/handling, submission of specimen other than nasopharyngeal swab, presence of viral mutation(s) within the areas targeted by this assay, and inadequate number of viral copies(<138 copies/mL). A negative result must be combined with clinical observations, patient history, and epidemiological information. The expected result is Negative.  Fact Sheet for Patients:  EntrepreneurPulse.com.au  Fact Sheet for Healthcare Providers:  IncredibleEmployment.be  This test is no t yet approved or cleared by the [place] FDA and  has been authorized for detection and/or diagnosis of SARS-CoV-2 by FDA under an Emergency Use Authorization (EUA). This EUA will remain  in effect (meaning this test can be used) for the duration of the COVID-19 declaration under Section 564(b)(1) of the Act, 21 U.S.C.section 360bbb-3(b)(1), unless the authorization is terminated  or revoked sooner.       Influenza A by PCR NEGATIVE NEGATIVE Final   Influenza B by PCR NEGATIVE NEGATIVE Final    Comment: (NOTE) The Xpert Xpress SARS-CoV-2/FLU/RSV plus assay is intended as an aid in the diagnosis of influenza from Nasopharyngeal swab specimens and should not be used as a sole basis for treatment. Nasal washings and aspirates are unacceptable for Xpert Xpress SARS-CoV-2/FLU/RSV testing.  Fact Sheet for  Patients: EntrepreneurPulse.com.au  Fact Sheet for Healthcare Providers: IncredibleEmployment.be  This test is not yet approved or cleared by the [place] FDA and has been authorized for detection and/or diagnosis of SARS-CoV-2 by FDA under an Emergency Use Authorization (EUA). This EUA will remain in effect (meaning this test can be used) for the duration of the COVID-19 declaration under Section 564(b)(1) of the Act, 21 U.S.C. section 360bbb-3(b)(1), unless the authorization is terminated or revoked.  Performed at [facility], [place] [address], [place], [place] [address]      Labs: CBC: Recent Labs  Lab 01/22/21 0315 01/22/21 0320  WBC 6.8  --   NEUTROABS 4.5  --   HGB 13.4 13.6  HCT 40.3 40.0  MCV 92.2  --   PLT 191  --     Basic Metabolic Panel: Recent Labs  Lab 01/22/21 0315 01/22/21 0320  NA 137 139  K 4.8 4.2  CL 103 104  CO2 24  --   GLUCOSE 116* 108*  BUN 17 20  CREATININE 1.06 1.00  CALCIUM 9.4  --     Liver Function Tests: Recent Labs  Lab 01/22/21 0315  AST 41  ALT 18  ALKPHOS 79  BILITOT 1.0  PROT 6.7  ALBUMIN 3.8  CBG: Recent Labs  Lab 01/22/21 0316  GLUCAP 119*    Hgb A1c Recent Labs    01/22/21 0500  HGBA1C 5.6    Lipid Profile Recent Labs    01/22/21 0430  CHOL 184  HDL 34*  LDLCALC UNABLE TO CALCULATE IF TRIGLYCERIDE OVER 400 mg/dL  TRIG 472*  CHOLHDL 5.4  LDLDIRECT 55.6    Thyroid function studies Recent Labs    01/22/21 0702  TSH 2.532     Urinalysis    Component Value Date/Time   COLORURINE STRAW (A) 01/22/2021 0550   APPEARANCEUR CLEAR 01/22/2021 0550   LABSPEC 1.045 (H) 01/22/2021 0550   PHURINE 5.0 01/22/2021 0550   GLUCOSEU NEGATIVE 01/22/2021 0550   HGBUR NEGATIVE 01/22/2021 0550   [place] NEGATIVE 01/22/2021 0550   [place] 01/22/2021 0550   PROTEINUR NEGATIVE 01/22/2021 0550   NITRITE NEGATIVE 01/22/2021 0550   LEUKOCYTESUR  NEGATIVE 01/22/2021 0550    I discussed in detail with patient's spouse, updated care and answered all questions  Time coordinating discharge: 35 minutes  SIGNED:  Vernell Leep, MD, FACP, [facility]. [facility]  To contact the attending provider between 7A-7P or the covering provider during after hours 7P-7A, please log into the web site [email or web address] and access using universal  password for that web site. If you do not have the password, please call the hospital operator.

## 2021-01-22 NOTE — Progress Notes (Addendum)
STROKE TEAM PROGRESS NOTE   INTERVAL HISTORY His wife is at the bedside.   Patient wife is more verbose than patient. Patient does talk at times, but spends most of the initial exam attempting to eat, although he reports that he has a sore throat that is making this difficult. Patient wife reports that she noted that her husband had trouble understanding her and appeared to be confused sometime after 11pm. She also noted that his speech seemed a bit  "slurred." Patient reports that this somewhat cleared and she went to shower and left her husband at the TV. When he came to bed the wife reports that his behavior was abnormal and that she tried to ask him why he was acting different, but he did not respond. Patient wife reports that she tried to wake the patient but he would not respond. Wife called EMS around 1am and by the time EMS arrived patient appeared back at his baseline, but she still insisted he come to the hospital. On exam this AM patient reports he does not recall either of the events.  This AM patient reports that he feels at baseline with the exemption of his sore throat. Patient does report that he has had intermittent episodes of dizziness but no LOC or unexplained falls.  Patient did report that he has been taking his Xarelto for prior DVT's daily, but reported that he has not been taking the medication with food or consistently at the same time.  Patient and wife were notified that patient MRI and CT were negative.   Vitals:   01/22/21 0550 01/22/21 0600 01/22/21 0857 01/22/21 1128  BP: (!) 157/82  (!) 151/84 (!) 144/80  Pulse: 86  77 84  Resp: 18  18 18   Temp: 98.1 F (36.7 C)  98.4 F (36.9 C) 98.6 F (37 C)  TempSrc: Oral  Oral Oral  SpO2: (!) 89% 92%    Weight:      Height:       CBC:  Recent Labs  Lab 01/22/21 0315 01/22/21 0320  WBC 6.8  --   NEUTROABS 4.5  --   HGB 13.4 13.6  HCT 40.3 40.0  MCV 92.2  --   PLT 191  --    Basic Metabolic Panel:  Recent Labs   Lab 01/22/21 0315 01/22/21 0320  NA 137 139  K 4.8 4.2  CL 103 104  CO2 24  --   GLUCOSE 116* 108*  BUN 17 20  CREATININE 1.06 1.00  CALCIUM 9.4  --    Lipid Panel:  Recent Labs  Lab 01/22/21 0430  CHOL 184  TRIG 472*  HDL 34*  CHOLHDL 5.4  VLDL UNABLE TO CALCULATE IF TRIGLYCERIDE OVER 400 mg/dL  LDLCALC UNABLE TO CALCULATE IF TRIGLYCERIDE OVER 400 mg/dL   HgbA1c:  Recent Labs  Lab 01/22/21 0500  HGBA1C 5.6   Urine Drug Screen:  Recent Labs  Lab 01/22/21 0550  LABOPIA NONE DETECTED  COCAINSCRNUR NONE DETECTED  LABBENZ NONE DETECTED  AMPHETMU NONE DETECTED  THCU NONE DETECTED  LABBARB NONE DETECTED    Alcohol Level  Recent Labs  Lab 01/22/21 0315  ETH <10    IMAGING past 24 hours CT ANGIO HEAD W OR WO CONTRAST  Result Date: 01/22/2021 CLINICAL DATA:  Aphasia EXAM: CT ANGIOGRAPHY HEAD AND NECK CT PERFUSION BRAIN TECHNIQUE: Multidetector CT imaging of the head and neck was performed using the standard protocol during bolus administration of intravenous contrast. Multiplanar CT image reconstructions and  MIPs were obtained to evaluate the vascular anatomy. Carotid stenosis measurements (when applicable) are obtained utilizing NASCET criteria, using the distal internal carotid diameter as the denominator. Multiphase CT imaging of the brain was performed following IV bolus contrast injection. Subsequent parametric perfusion maps were calculated using RAPID software. CONTRAST:  177mL OMNIPAQUE IOHEXOL 350 MG/ML SOLN COMPARISON:  None. FINDINGS: CTA NECK FINDINGS SKELETON: There is no bony spinal canal stenosis. No lytic or blastic lesion. OTHER NECK: Normal pharynx, larynx and major salivary glands. No cervical lymphadenopathy. Unremarkable thyroid gland. UPPER CHEST: No pneumothorax or pleural effusion. No nodules or masses. AORTIC ARCH: There is web-like atherosclerosis in the ascending aorta. Mild calcific atherosclerosis of the aortic arch. Conventional 3 vessel aortic  branching pattern. The visualized proximal subclavian arteries are widely patent. RIGHT CAROTID SYSTEM: Mild atherosclerotic plaque at the carotid bifurcation and proximal ICA without hemodynamically significant stenosis. LEFT CAROTID SYSTEM: No dissection, occlusion or aneurysm. There is mixed density atherosclerosis extending into the proximal ICA, resulting in less than 50% stenosis. VERTEBRAL ARTERIES: Left dominant configuration. Both origins are clearly patent. There is no dissection, occlusion or flow-limiting stenosis to the skull base (V1-V3 segments). CTA HEAD FINDINGS POSTERIOR CIRCULATION: --Vertebral arteries: Normal V4 segments. --Inferior cerebellar arteries: Normal. --Basilar artery: Normal. --Superior cerebellar arteries: Normal. --Posterior cerebral arteries (PCA): Normal. ANTERIOR CIRCULATION: --Intracranial internal carotid arteries: Normal. --Anterior cerebral arteries (ACA): Normal. Both A1 segments are present. Patent anterior communicating artery (a-comm). --Middle cerebral arteries (MCA): Normal. VENOUS SINUSES: As permitted by contrast timing, patent. ANATOMIC VARIANTS: None Review of the MIP images confirms the above findings. CT Brain Perfusion Findings: ASPECTS: 10 CBF (<30%) Volume: 1mL Perfusion (Tmax>6.0s) volume: 18mL Mismatch Volume: 67mL Infarction Location:None IMPRESSION: 1. No emergent large vessel occlusion or high-grade stenosis of the intracranial arteries. 2. Bilateral carotid bifurcation atherosclerosis without hemodynamically significant stenosis by NASCET criteria. 3. Web-like atherosclerosis in the ascending aorta. Aortic Atherosclerosis (ICD10-I70.0). Electronically Signed   By: Ulyses Jarred M.D.   On: 01/22/2021 03:56   CT ANGIO NECK W OR WO CONTRAST  Result Date: 01/22/2021 CLINICAL DATA:  Aphasia EXAM: CT ANGIOGRAPHY HEAD AND NECK CT PERFUSION BRAIN TECHNIQUE: Multidetector CT imaging of the head and neck was performed using the standard protocol during bolus  administration of intravenous contrast. Multiplanar CT image reconstructions and MIPs were obtained to evaluate the vascular anatomy. Carotid stenosis measurements (when applicable) are obtained utilizing NASCET criteria, using the distal internal carotid diameter as the denominator. Multiphase CT imaging of the brain was performed following IV bolus contrast injection. Subsequent parametric perfusion maps were calculated using RAPID software. CONTRAST:  153mL OMNIPAQUE IOHEXOL 350 MG/ML SOLN COMPARISON:  None. FINDINGS: CTA NECK FINDINGS SKELETON: There is no bony spinal canal stenosis. No lytic or blastic lesion. OTHER NECK: Normal pharynx, larynx and major salivary glands. No cervical lymphadenopathy. Unremarkable thyroid gland. UPPER CHEST: No pneumothorax or pleural effusion. No nodules or masses. AORTIC ARCH: There is web-like atherosclerosis in the ascending aorta. Mild calcific atherosclerosis of the aortic arch. Conventional 3 vessel aortic branching pattern. The visualized proximal subclavian arteries are widely patent. RIGHT CAROTID SYSTEM: Mild atherosclerotic plaque at the carotid bifurcation and proximal ICA without hemodynamically significant stenosis. LEFT CAROTID SYSTEM: No dissection, occlusion or aneurysm. There is mixed density atherosclerosis extending into the proximal ICA, resulting in less than 50% stenosis. VERTEBRAL ARTERIES: Left dominant configuration. Both origins are clearly patent. There is no dissection, occlusion or flow-limiting stenosis to the skull base (V1-V3 segments). CTA HEAD FINDINGS POSTERIOR CIRCULATION: --Vertebral  arteries: Normal V4 segments. --Inferior cerebellar arteries: Normal. --Basilar artery: Normal. --Superior cerebellar arteries: Normal. --Posterior cerebral arteries (PCA): Normal. ANTERIOR CIRCULATION: --Intracranial internal carotid arteries: Normal. --Anterior cerebral arteries (ACA): Normal. Both A1 segments are present. Patent anterior communicating artery  (a-comm). --Middle cerebral arteries (MCA): Normal. VENOUS SINUSES: As permitted by contrast timing, patent. ANATOMIC VARIANTS: None Review of the MIP images confirms the above findings. CT Brain Perfusion Findings: ASPECTS: 10 CBF (<30%) Volume: 11mL Perfusion (Tmax>6.0s) volume: 28mL Mismatch Volume: 80mL Infarction Location:None IMPRESSION: 1. No emergent large vessel occlusion or high-grade stenosis of the intracranial arteries. 2. Bilateral carotid bifurcation atherosclerosis without hemodynamically significant stenosis by NASCET criteria. 3. Web-like atherosclerosis in the ascending aorta. Aortic Atherosclerosis (ICD10-I70.0). Electronically Signed   By: Ulyses Jarred M.D.   On: 01/22/2021 03:56   MR BRAIN WO CONTRAST  Result Date: 01/22/2021 CLINICAL DATA:  TIA.  Aphasia EXAM: MRI HEAD WITHOUT CONTRAST TECHNIQUE: Multiplanar, multiecho pulse sequences of the brain and surrounding structures were obtained without intravenous contrast. COMPARISON:  CT and CTA from earlier today FINDINGS: Brain: No acute infarction, hemorrhage, hydrocephalus, extra-axial collection or mass lesion. Chronic small vessel ischemia in the cerebral white matter to a mild degree. Small remote right frontal infarct affecting anterior cortex and subjacent white matter. Chronic right basal ganglia lacunes. Small remote left cerebellar infarction. Remote micro hemorrhages in the left centrum semiovale, right cerebellum, and left low parietal lobe. No generalized cortical involvement for amyloid angiopathy. Vascular: Preserved flow voids. Notable ICA tortuosity below the skull base. Skull and upper cervical spine: No focal marrow lesion Sinuses/Orbits: Negative IMPRESSION: 1. No acute finding. 2. Chronic small vessel disease and small remote right frontal lobe infarct. Electronically Signed   By: Monte Fantasia M.D.   On: 01/22/2021 08:33   CT CEREBRAL PERFUSION W CONTRAST  Result Date: 01/22/2021 CLINICAL DATA:  Aphasia EXAM: CT  ANGIOGRAPHY HEAD AND NECK CT PERFUSION BRAIN TECHNIQUE: Multidetector CT imaging of the head and neck was performed using the standard protocol during bolus administration of intravenous contrast. Multiplanar CT image reconstructions and MIPs were obtained to evaluate the vascular anatomy. Carotid stenosis measurements (when applicable) are obtained utilizing NASCET criteria, using the distal internal carotid diameter as the denominator. Multiphase CT imaging of the brain was performed following IV bolus contrast injection. Subsequent parametric perfusion maps were calculated using RAPID software. CONTRAST:  163mL OMNIPAQUE IOHEXOL 350 MG/ML SOLN COMPARISON:  None. FINDINGS: CTA NECK FINDINGS SKELETON: There is no bony spinal canal stenosis. No lytic or blastic lesion. OTHER NECK: Normal pharynx, larynx and major salivary glands. No cervical lymphadenopathy. Unremarkable thyroid gland. UPPER CHEST: No pneumothorax or pleural effusion. No nodules or masses. AORTIC ARCH: There is web-like atherosclerosis in the ascending aorta. Mild calcific atherosclerosis of the aortic arch. Conventional 3 vessel aortic branching pattern. The visualized proximal subclavian arteries are widely patent. RIGHT CAROTID SYSTEM: Mild atherosclerotic plaque at the carotid bifurcation and proximal ICA without hemodynamically significant stenosis. LEFT CAROTID SYSTEM: No dissection, occlusion or aneurysm. There is mixed density atherosclerosis extending into the proximal ICA, resulting in less than 50% stenosis. VERTEBRAL ARTERIES: Left dominant configuration. Both origins are clearly patent. There is no dissection, occlusion or flow-limiting stenosis to the skull base (V1-V3 segments). CTA HEAD FINDINGS POSTERIOR CIRCULATION: --Vertebral arteries: Normal V4 segments. --Inferior cerebellar arteries: Normal. --Basilar artery: Normal. --Superior cerebellar arteries: Normal. --Posterior cerebral arteries (PCA): Normal. ANTERIOR CIRCULATION:  --Intracranial internal carotid arteries: Normal. --Anterior cerebral arteries (ACA): Normal. Both A1 segments are present. Patent anterior  communicating artery (a-comm). --Middle cerebral arteries (MCA): Normal. VENOUS SINUSES: As permitted by contrast timing, patent. ANATOMIC VARIANTS: None Review of the MIP images confirms the above findings. CT Brain Perfusion Findings: ASPECTS: 10 CBF (<30%) Volume: 84mL Perfusion (Tmax>6.0s) volume: 24mL Mismatch Volume: 29mL Infarction Location:None IMPRESSION: 1. No emergent large vessel occlusion or high-grade stenosis of the intracranial arteries. 2. Bilateral carotid bifurcation atherosclerosis without hemodynamically significant stenosis by NASCET criteria. 3. Web-like atherosclerosis in the ascending aorta. Aortic Atherosclerosis (ICD10-I70.0). Electronically Signed   By: Ulyses Jarred M.D.   On: 01/22/2021 03:56   ECHOCARDIOGRAM COMPLETE  Result Date: 01/22/2021    ECHOCARDIOGRAM REPORT   Patient Name:   QUANTAVIUS HUMM Date of Exam: 01/22/2021 Medical Rec #:  [id number]        Height:       70.0 in Accession #:    [id number]       Weight:       225.1 lb Date of Birth:  Feb 25, 1951        BSA:          2.195 m Patient Age:    47 years         BP:           151/84 mmHg Patient Gender: M                HR:           72 bpm. Exam Location:  Inpatient Procedure: 2D Echo Indications:    TIA  History:        Patient has prior history of Echocardiogram examinations, most                 recent 05/19/2016. CAD, Prior CABG and ascending aorta                 replacement; Risk Factors:Hypertension and Dyslipidemia.  Sonographer:    Johny Chess Referring Phys: [id number] [place]  1. Septal and inferior wall hypokinesis . Left ventricular ejection fraction, by estimation, is 30 to 35%. The left ventricle has moderately decreased function. The left ventricle demonstrates regional wall motion abnormalities (see scoring diagram/findings for description). The left  ventricular internal cavity size was mildly dilated. Left ventricular diastolic parameters were normal.  2. Right ventricular systolic function is normal. The right ventricular size is normal. There is normal pulmonary artery systolic pressure.  3. Left atrial size was moderately dilated.  4. The mitral valve is abnormal. Mild mitral valve regurgitation. No evidence of mitral stenosis.  5. The aortic valve is tricuspid. There is moderate calcification of the aortic valve. Aortic valve regurgitation is mild. Mild aortic valve sclerosis is present, with no evidence of aortic valve stenosis.  6. The inferior vena cava is normal in size with greater than 50% respiratory variability, suggesting right atrial pressure of 3 mmHg. FINDINGS  Left Ventricle: Septal and inferior wall hypokinesis. Left ventricular ejection fraction, by estimation, is 30 to 35%. The left ventricle has moderately decreased function. The left ventricle demonstrates regional wall motion abnormalities. The left ventricular internal cavity size was mildly dilated. There is no left ventricular hypertrophy. Left ventricular diastolic parameters were normal. Right Ventricle: The right ventricular size is normal. No increase in right ventricular wall thickness. Right ventricular systolic function is normal. There is normal pulmonary artery systolic pressure. The tricuspid regurgitant velocity is 2.84 m/s, and  with an assumed right atrial pressure of 3 mmHg, the estimated right ventricular systolic pressure is  35.3 mmHg. Left Atrium: Left atrial size was moderately dilated. Right Atrium: Right atrial size was normal in size. Pericardium: There is no evidence of pericardial effusion. Mitral Valve: The mitral valve is abnormal. There is mild thickening of the mitral valve leaflet(s). There is mild calcification of the mitral valve leaflet(s). Mild mitral annular calcification. Mild mitral valve regurgitation. No evidence of mitral valve stenosis. Tricuspid  Valve: The tricuspid valve is normal in structure. Tricuspid valve regurgitation is mild . No evidence of tricuspid stenosis. Aortic Valve: The aortic valve is tricuspid. There is moderate calcification of the aortic valve. Aortic valve regurgitation is mild. Aortic regurgitation PHT measures 316 msec. Mild aortic valve sclerosis is present, with no evidence of aortic valve stenosis. Pulmonic Valve: The pulmonic valve was normal in structure. Pulmonic valve regurgitation is not visualized. No evidence of pulmonic stenosis. Aorta: The aortic root is normal in size and structure. Venous: The inferior vena cava is normal in size with greater than 50% respiratory variability, suggesting right atrial pressure of 3 mmHg. IAS/Shunts: The interatrial septum was not well visualized.  LEFT VENTRICLE PLAX 2D LVIDd:         5.00 cm  Diastology LVIDs:         4.00 cm  LV e' lateral:   11.70 cm/s LV PW:         1.10 cm  LV E/e' lateral: 6.9 LV IVS:        1.10 cm LVOT diam:     2.10 cm LV SV:         63 LV SV Index:   29 LVOT Area:     3.46 cm  RIGHT VENTRICLE RV S prime:     9.25 cm/s TAPSE (M-mode): 2.4 cm LEFT ATRIUM              Index       RIGHT ATRIUM           Index LA diam:        4.90 cm  2.23 cm/m  RA Area:     19.00 cm LA Vol (A2C):   79.6 ml  36.27 ml/m RA Volume:   50.50 ml  23.01 ml/m LA Vol (A4C):   102.0 ml 46.47 ml/m LA Biplane Vol: 98.0 ml  44.65 ml/m  AORTIC VALVE LVOT Vmax:   97.30 cm/s LVOT Vmean:  61.300 cm/s LVOT VTI:    0.183 m AI PHT:      316 msec  AORTA Ao Root diam: 3.60 cm MITRAL VALVE               TRICUSPID VALVE MV Area (PHT): 3.99 cm    TR Peak grad:   32.3 mmHg MV Decel Time: 190 msec    TR Vmax:        284.00 cm/s MV E velocity: 81.00 cm/s MV A velocity: 54.00 cm/s  SHUNTS MV E/A ratio:  1.50        Systemic VTI:  0.18 m                            Systemic Diam: 2.10 cm Jenkins Rouge MD Electronically signed by Jenkins Rouge MD Signature Date/Time: 01/22/2021/11:31:45 AM    Final    CT  HEAD CODE STROKE WO CONTRAST  Result Date: 01/22/2021 CLINICAL DATA:  Code stroke.  Aphasia and dysarthria EXAM: CT HEAD WITHOUT CONTRAST TECHNIQUE: Contiguous axial images were obtained from the base of the skull  through the vertex without intravenous contrast. COMPARISON:  None. FINDINGS: Brain: There is no mass, hemorrhage or extra-axial collection. The size and configuration of the ventricles and extra-axial CSF spaces are normal. Subcortical hypoattenuation within the right frontal lobe, likely a chronic ischemic insult. 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 Vascular: No abnormal hyperdensity of the major intracranial arteries or dural venous sinuses. No intracranial atherosclerosis. Skull: The visualized skull base, calvarium and extracranial soft tissues are normal. Sinuses/Orbits: No fluid levels or advanced mucosal thickening of the visualized paranasal sinuses. No mastoid or middle ear effusion. The orbits are normal. ASPECTS [facility] Stroke Program Early CT Score) - Ganglionic level infarction (caudate, lentiform nuclei, internal capsule, insula, M1-M3 cortex): 7 - Supraganglionic infarction (M4-M6 cortex): 3 Total score (0-10 with 10 being normal): 10 IMPRESSION: 1. No acute intracranial abnormality. 2. ASPECTS is 10. 3. 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 These results were communicated to Dr. Lesleigh Noe at 3:32 am on 01/22/2021 by text page via the [facility] messaging system. Electronically Signed   By: Ulyses Jarred M.D.   On: 01/22/2021 03:33    PHYSICAL EXAM Constitutional: Appears well-developed and well-nourished elderly Caucasian male.  Psych: Affect appropriate to situation, calm and cooperative Eyes: No scleral injection HENT: No oropharyngeal obstruction, obvious pain with swallowing  MSK: no joint deformities.  Cardiovascular: Normal rate and regular rhythm.  Respiratory: Effort normal, non-labored breathing Skin: Warm  dry and intact visible skin  Neuro: Mental Status: Patient is awake, alert, oriented to person, place, month, and situation; reported the year was 2021 Patient is able to answer some questions and can give hx on his on health, but reports he does not recall the events that concerned his wife and led to his presentation No signs of aphasia or neglect Cranial Nerves: II: Visual Fields are full. Pupils are equal, round, and reactive to light.   III,IV, VI: EOMI without ptosis or diploplia.  V: Facial sensation is symmetric to light touch VII: Facial movement is symmetric.  VIII: hearing is intact to voice X: Uvula elevates symmetrically XI: Shoulder shrug is symmetric. XII: tongue is midline without atrophy or fasciculations.  Motor: Tone is normal. Bulk is normal. 5/5 strength was present in all four extremities.   Sensory: Sensation is symmetric to light touch and temperature in the arms and legs.  Plantars: Toes are downgoing bilaterally.  Cerebellar: FTN intact bilaterally  ASSESSMENT/PLAN [name] is a 70 y.o. male with history of of DJD of lumbar spine, frequent lower back pain, history of bilateral PE, history of DVT, glaucoma, hyperlipidemia, hypertension, impaired fasting glucose, OSA no longer on BiPAP, CAD/CABG (per patient required defibrillation during CABG) who presented with AMS and recent episode with dysarthria. Patient symptoms resolved by the time he presented to the ED and his AMS improved on his initial exam. Patient CT head and MRI were negative for acute ischemia or hemorrhage. Patient appears to have had a TIA. Echo did note mild worsening of his HFrEF from 40-45% (2017) to 30-35%, patient does have a OP Cardiologist. At this time will recommend that patient continue his Xarelto. If the patient did in fact have a TIA cannot consider his Xarelto a failure because patient is not taking the medication with food; therefore the medication is not able to have  it's optimal therapeutic effect. Educated the patient on the importance of taking the medication at the same time everyday with his largest meal  in order to allow for appropriate metabolism and drug coverage. Patient and wife indicated understanding. It is concerning that patient's symptoms lasted for approx 2-2.5 hrs and patient does not recall the episodes. This presentation is concerning for seizure with pro-longed post- ictal phase. Patient has no hx of seizures. Will attempt to rule out epileptiform activity.  Posterior circulation TIA vs unwitnessed seizure with post ictal presentation  Code Stroke : CT head No acute abnormality. Small vessel disease. ASPECTS 10.   CTA head & neck: No high grade stenosis of the intracranial arteries and no Hemodynamically significant stenosis of bilateral carotids  CT perfusion: negative  MRI : No acute findings, chronic small vessel dx and small remote frontal lobe infarct  2D Echo: LVEF 30-35%, LV regional wall abnormalities (septal and inferior wall hypokinesis), Mild AVR, L atrium mildy enlarged, Mild MVR,  EEG pending  LDL 55.6  HgbA1c 5.6  VTE prophylaxis - SCDs    Diet   Diet Heart Room service appropriate? Yes; Fluid consistency: Thin     aspirin 81 mg daily and Xarelto (rivaroxaban) daily prior to admission will restart ,Xarelto (rivaroxaban) daily.   Therapy recommendations: PT  Disposition:  Home  Hyperlipidemia  Home meds: simvastatin 40mg  QHS, resumed in hospital  LDL 55.6, goal < 70  Continue statin at discharge  Other Stroke Risk Factors  Advanced Age >/= 45     Obesity, Body mass index is 32.3 kg/m., BMI >/= 30 associated with increased stroke risk, recommend weight loss, diet and exercise as appropriate   Hx stroke  Coronary artery disease  Congestive heart failure  Hospital day # 0  Damita Dunnings, MD PGY-1 I have personally obtained history,examined this patient, reviewed notes, independently viewed  imaging studies, participated in medical decision making and plan of care.ROS completed by me personally and pertinent positives fully documented  I have made any additions or clarifications directly to the above note. Agree with note above.  Patient presented with altered mental status and some confusion which lasted a few hours and resolved by the time he reached the hospital.  MRI is negative for acute stroke.  Possibility of unwitnessed seizure with postictal confusion is to be considered.    EEG. Prelim reading shows gen slowing but no definite seizure activity.  Continue Xarelto for DVT and pulmonary embolism and do not feel an addition of aspirin is necessary from the stroke standpoint.  Patient will be discharged home and follow-up as an outpatient as needed.  Long discussion patient and wife at the bedside and answered questions.  Discussed with Dr. Algis Liming.  Greater than 50% time during this 35-minute visit was spent in counseling and coordination of care and discussion about TIA versus seizure and answering questions.  Antony Contras, MD Medical Director [facility] Pager: [phone] 01/22/2021 2:48 PM To contact Stroke Continuity provider, please refer to [email or web address] After hours, contact General Neurology

## 2021-01-22 NOTE — Discharge Instructions (Signed)

## 2021-01-22 NOTE — ED Provider Notes (Signed)
Patrick Fritz Provider Note   CSN: [id number] Arrival date & time: 01/22/21  0310   History Chief Complaint  Patient presents with  . Code Stroke    Patrick Fritz is a 70 y.o. male.  The history is provided by the patient and the spouse.  He has history of hypertension, hyperlipidemia, pulmonary embolism anticoagulated on rivaroxaban and came in by ambulance as a code stroke.  He was last seen normal at about 1 AM.  At about 2:15 AM, his wife noted that he was not speaking properly, and he seemed to be upset that she could not understand him.  There was no observed weakness.  He denies headache.  Wife states that his speech is now back to his baseline.  Past Medical History:  Diagnosis Date  . DJD (degenerative joint disease), lumbar   . DVT (deep venous thrombosis) ([place])   . Glaucoma    R eye diminished vision approx 50%  . High cholesterol   . Hyperlipidemia   . Hypertension   . IFG (impaired fasting glucose)   . OSA (obstructive sleep apnea)    Severe w AHI 34/hr on HST no on BiPAP at 19/15cm H2O  . PE (pulmonary embolism)    Second occurance,enknown etiology,lifelong anticoagulation    Patient Active Problem List   Diagnosis Date Noted  . Bilateral pulmonary embolism ([place]) 07/12/2016  . S/P CABG x 2 05/22/2016  . Hx of ascending aorta replacement 05/22/2016  . Chest pain   . History of ST elevation myocardial infarction (STEMI)   . Obstructive sleep apnea 12/16/2013  . Morbid obesity ([place]) 12/16/2013  . SOB (shortness of breath) 12/16/2013    Past Surgical History:  Procedure Laterality Date  . CARDIAC CATHETERIZATION N/A 05/14/2016   Procedure: Left Heart Cath and Coronary Angiography;  Surgeon: Belva Crome, MD;  Location: [facility];  Service: Cardiovascular;  Laterality: N/A;  . 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 Osteoporosis Mother   . Hypertension Father   . CAD Father     Social History   Tobacco Use  . Smoking status: Never Smoker  . Smokeless tobacco: Never Used  Substance Use Topics  . Alcohol use: Yes    Comment: 1-2 per week    Home Medications Prior to Admission medications   Medication Sig Start Date End Date Taking? Authorizing Provider  allopurinol (ZYLOPRIM) 300 MG tablet Take 300 mg by mouth daily.     [provider]  aspirin EC 81 MG tablet Take 1 tablet (81 mg total) by mouth every Monday, Wednesday, and Friday. Swallow whole. 08/20/20   Belva Crome, MD  folic acid (FOLVITE) 1 MG tablet Take 1 tablet (1 mg total) by mouth daily. 05/23/16   Gold, Wayne E, PA-C  furosemide (LASIX) 40 MG tablet Take 0.5 tablets (20 mg total) by mouth daily. 08/20/20   Belva Crome, MD  gabapentin (NEURONTIN) 100 MG capsule Take 100 mg by mouth 2 (two) times daily.    [provider]  hydroxychloroquine (PLAQUENIL) 200 MG tablet Take 200 mg by mouth daily. Take with food  or milk     [provider]  methotrexate 2.5 MG tablet Take 1 tablet by mouth daily.    [provider]  Multiple Vitamins-Minerals (OCUVITE EYE HEALTH FORMULA PO) Take 1 tablet by mouth daily.    [provider]  potassium chloride SA (KLOR-CON) 20 MEQ tablet Take 1 tablet (20 mEq total) by mouth every Monday, Wednesday, and Friday. 08/20/20   Belva Crome, MD  rivaroxaban (XARELTO) 20 MG TABS tablet Take 1 tablet (20 mg total) by mouth daily with supper. 06/09/16   Isaiah Serge, NP  sertraline (ZOLOFT) 50 MG tablet Take 50 mg by mouth daily. 06/20/16   [provider]  simvastatin (ZOCOR) 40 MG tablet Take 1 tablet (40 mg total) by mouth at bedtime. 08/20/20    Belva Crome, MD  traMADol (ULTRAM) 50 MG tablet Take one to two (50 mg to 100 mg total) by mouth every six (6) hours as needed for pain. 06/23/16   [provider]    Allergies    Penicillins  Review of Systems   Review of Systems  All other systems reviewed and are negative.   Physical Exam Updated Vital Signs BP (!) 157/91 (BP Location: Left Arm)   Pulse 86   Temp 98.9 F (37.2 C) (Oral)   Resp 19   Ht 5\' 10"  (1.778 m)   Wt 102.1 kg   SpO2 93%   BMI 32.30 kg/m   Physical Exam Vitals and nursing note reviewed.   70 year old male, resting comfortably and in no acute distress. Vital signs are significant for elevated blood pressure. Oxygen saturation is 93%, which is normal. Head is normocephalic and atraumatic. PERRLA, EOMI. Oropharynx is clear. Neck is nontender and supple without adenopathy or JVD.  There are no carotid bruits. Back is nontender and there is no CVA tenderness. Lungs are clear without rales, wheezes, or rhonchi. Chest is nontender. Heart has regular rate and rhythm without murmur. Abdomen is soft, flat, nontender without masses or hepatosplenomegaly and peristalsis is normoactive. Extremities have no cyanosis or edema, full range of motion is present. Skin is warm and dry without rash. Neurologic: Mental status is normal, speech is normal, cranial nerves are intact, strength is 5/5 in both arms and both legs, no pronator drift.  ED Results / Procedures / Treatments   Labs (all labs ordered are listed, but only abnormal results are displayed) Labs Reviewed  PROTIME-INR - Abnormal; Notable for the following components:      Result Value   Prothrombin Time 21.2 (*)    INR 1.9 (*)    All other components within normal limits  I-STAT CHEM 8, ED - Abnormal; Notable for the following components:   Glucose, Bld 108 (*)    All other components within normal limits  CBG MONITORING, ED - Abnormal; Notable for the following components:    Glucose-Capillary 119 (*)    All other components within normal limits  RESP PANEL BY RT-PCR (FLU A&B, COVID) ARPGX2  APTT  CBC  DIFFERENTIAL  ETHANOL  COMPREHENSIVE METABOLIC PANEL  RAPID URINE DRUG SCREEN, HOSP PERFORMED  URINALYSIS, ROUTINE W REFLEX MICROSCOPIC    EKG None  Radiology CT ANGIO HEAD W OR WO CONTRAST  Result Date: 01/22/2021 CLINICAL DATA:  Aphasia EXAM: CT ANGIOGRAPHY HEAD AND NECK CT PERFUSION BRAIN TECHNIQUE: Multidetector CT imaging of the head and neck was performed using the standard protocol during bolus administration of intravenous contrast. Multiplanar CT image reconstructions and MIPs  were obtained to evaluate the vascular anatomy. Carotid stenosis measurements (when applicable) are obtained utilizing NASCET criteria, using the distal internal carotid diameter as the denominator. Multiphase CT imaging of the brain was performed following IV bolus contrast injection. Subsequent parametric perfusion maps were calculated using RAPID software. CONTRAST:  170mL OMNIPAQUE IOHEXOL 350 MG/ML SOLN COMPARISON:  None. FINDINGS: CTA NECK FINDINGS SKELETON: There is no bony spinal canal stenosis. No lytic or blastic lesion. OTHER NECK: Normal pharynx, larynx and major salivary glands. No cervical lymphadenopathy. Unremarkable thyroid gland. UPPER CHEST: No pneumothorax or pleural effusion. No nodules or masses. AORTIC ARCH: There is web-like atherosclerosis in the ascending aorta. Mild calcific atherosclerosis of the aortic arch. Conventional 3 vessel aortic branching pattern. The visualized proximal subclavian arteries are widely patent. RIGHT CAROTID SYSTEM: Mild atherosclerotic plaque at the carotid bifurcation and proximal ICA without hemodynamically significant stenosis. LEFT CAROTID SYSTEM: No dissection, occlusion or aneurysm. There is mixed density atherosclerosis extending into the proximal ICA, resulting in less than 50% stenosis. VERTEBRAL ARTERIES: Left dominant  configuration. Both origins are clearly patent. There is no dissection, occlusion or flow-limiting stenosis to the skull base (V1-V3 segments). CTA HEAD FINDINGS POSTERIOR CIRCULATION: --Vertebral arteries: Normal V4 segments. --Inferior cerebellar arteries: Normal. --Basilar artery: Normal. --Superior cerebellar arteries: Normal. --Posterior cerebral arteries (PCA): Normal. ANTERIOR CIRCULATION: --Intracranial internal carotid arteries: Normal. --Anterior cerebral arteries (ACA): Normal. Both A1 segments are present. Patent anterior communicating artery (a-comm). --Middle cerebral arteries (MCA): Normal. VENOUS SINUSES: As permitted by contrast timing, patent. ANATOMIC VARIANTS: None Review of the MIP images confirms the above findings. CT Brain Perfusion Findings: ASPECTS: 10 CBF (<30%) Volume: 73mL Perfusion (Tmax>6.0s) volume: 10mL Mismatch Volume: 62mL Infarction Location:None IMPRESSION: 1. No emergent large vessel occlusion or high-grade stenosis of the intracranial arteries. 2. Bilateral carotid bifurcation atherosclerosis without hemodynamically significant stenosis by NASCET criteria. 3. Web-like atherosclerosis in the ascending aorta. Aortic Atherosclerosis (ICD10-I70.0). Electronically Signed   By: Ulyses Jarred M.D.   On: 01/22/2021 03:56   CT ANGIO NECK W OR WO CONTRAST  Result Date: 01/22/2021 CLINICAL DATA:  Aphasia EXAM: CT ANGIOGRAPHY HEAD AND NECK CT PERFUSION BRAIN TECHNIQUE: Multidetector CT imaging of the head and neck was performed using the standard protocol during bolus administration of intravenous contrast. Multiplanar CT image reconstructions and MIPs were obtained to evaluate the vascular anatomy. Carotid stenosis measurements (when applicable) are obtained utilizing NASCET criteria, using the distal internal carotid diameter as the denominator. Multiphase CT imaging of the brain was performed following IV bolus contrast injection. Subsequent parametric perfusion maps were calculated  using RAPID software. CONTRAST:  162mL OMNIPAQUE IOHEXOL 350 MG/ML SOLN COMPARISON:  None. FINDINGS: CTA NECK FINDINGS SKELETON: There is no bony spinal canal stenosis. No lytic or blastic lesion. OTHER NECK: Normal pharynx, larynx and major salivary glands. No cervical lymphadenopathy. Unremarkable thyroid gland. UPPER CHEST: No pneumothorax or pleural effusion. No nodules or masses. AORTIC ARCH: There is web-like atherosclerosis in the ascending aorta. Mild calcific atherosclerosis of the aortic arch. Conventional 3 vessel aortic branching pattern. The visualized proximal subclavian arteries are widely patent. RIGHT CAROTID SYSTEM: Mild atherosclerotic plaque at the carotid bifurcation and proximal ICA without hemodynamically significant stenosis. LEFT CAROTID SYSTEM: No dissection, occlusion or aneurysm. There is mixed density atherosclerosis extending into the proximal ICA, resulting in less than 50% stenosis. VERTEBRAL ARTERIES: Left dominant configuration. Both origins are clearly patent. There is no dissection, occlusion or flow-limiting stenosis to the skull base (V1-V3 segments). CTA HEAD FINDINGS POSTERIOR CIRCULATION: --Vertebral arteries:  Normal V4 segments. --Inferior cerebellar arteries: Normal. --Basilar artery: Normal. --Superior cerebellar arteries: Normal. --Posterior cerebral arteries (PCA): Normal. ANTERIOR CIRCULATION: --Intracranial internal carotid arteries: Normal. --Anterior cerebral arteries (ACA): Normal. Both A1 segments are present. Patent anterior communicating artery (a-comm). --Middle cerebral arteries (MCA): Normal. VENOUS SINUSES: As permitted by contrast timing, patent. ANATOMIC VARIANTS: None Review of the MIP images confirms the above findings. CT Brain Perfusion Findings: ASPECTS: 10 CBF (<30%) Volume: 41mL Perfusion (Tmax>6.0s) volume: 12mL Mismatch Volume: 27mL Infarction Location:None IMPRESSION: 1. No emergent large vessel occlusion or high-grade stenosis of the intracranial  arteries. 2. Bilateral carotid bifurcation atherosclerosis without hemodynamically significant stenosis by NASCET criteria. 3. Web-like atherosclerosis in the ascending aorta. Aortic Atherosclerosis (ICD10-I70.0). Electronically Signed   By: Ulyses Jarred M.D.   On: 01/22/2021 03:56   CT CEREBRAL PERFUSION W CONTRAST  Result Date: 01/22/2021 CLINICAL DATA:  Aphasia EXAM: CT ANGIOGRAPHY HEAD AND NECK CT PERFUSION BRAIN TECHNIQUE: Multidetector CT imaging of the head and neck was performed using the standard protocol during bolus administration of intravenous contrast. Multiplanar CT image reconstructions and MIPs were obtained to evaluate the vascular anatomy. Carotid stenosis measurements (when applicable) are obtained utilizing NASCET criteria, using the distal internal carotid diameter as the denominator. Multiphase CT imaging of the brain was performed following IV bolus contrast injection. Subsequent parametric perfusion maps were calculated using RAPID software. CONTRAST:  161mL OMNIPAQUE IOHEXOL 350 MG/ML SOLN COMPARISON:  None. FINDINGS: CTA NECK FINDINGS SKELETON: There is no bony spinal canal stenosis. No lytic or blastic lesion. OTHER NECK: Normal pharynx, larynx and major salivary glands. No cervical lymphadenopathy. Unremarkable thyroid gland. UPPER CHEST: No pneumothorax or pleural effusion. No nodules or masses. AORTIC ARCH: There is web-like atherosclerosis in the ascending aorta. Mild calcific atherosclerosis of the aortic arch. Conventional 3 vessel aortic branching pattern. The visualized proximal subclavian arteries are widely patent. RIGHT CAROTID SYSTEM: Mild atherosclerotic plaque at the carotid bifurcation and proximal ICA without hemodynamically significant stenosis. LEFT CAROTID SYSTEM: No dissection, occlusion or aneurysm. There is mixed density atherosclerosis extending into the proximal ICA, resulting in less than 50% stenosis. VERTEBRAL ARTERIES: Left dominant configuration. Both  origins are clearly patent. There is no dissection, occlusion or flow-limiting stenosis to the skull base (V1-V3 segments). CTA HEAD FINDINGS POSTERIOR CIRCULATION: --Vertebral arteries: Normal V4 segments. --Inferior cerebellar arteries: Normal. --Basilar artery: Normal. --Superior cerebellar arteries: Normal. --Posterior cerebral arteries (PCA): Normal. ANTERIOR CIRCULATION: --Intracranial internal carotid arteries: Normal. --Anterior cerebral arteries (ACA): Normal. Both A1 segments are present. Patent anterior communicating artery (a-comm). --Middle cerebral arteries (MCA): Normal. VENOUS SINUSES: As permitted by contrast timing, patent. ANATOMIC VARIANTS: None Review of the MIP images confirms the above findings. CT Brain Perfusion Findings: ASPECTS: 10 CBF (<30%) Volume: 36mL Perfusion (Tmax>6.0s) volume: 59mL Mismatch Volume: 83mL Infarction Location:None IMPRESSION: 1. No emergent large vessel occlusion or high-grade stenosis of the intracranial arteries. 2. Bilateral carotid bifurcation atherosclerosis without hemodynamically significant stenosis by NASCET criteria. 3. Web-like atherosclerosis in the ascending aorta. Aortic Atherosclerosis (ICD10-I70.0). Electronically Signed   By: Ulyses Jarred M.D.   On: 01/22/2021 03:56   CT HEAD CODE STROKE WO CONTRAST  Result Date: 01/22/2021 CLINICAL DATA:  Code stroke.  Aphasia and dysarthria EXAM: CT HEAD WITHOUT CONTRAST TECHNIQUE: Contiguous axial images were obtained from the base of the skull through the vertex without intravenous contrast. COMPARISON:  None. FINDINGS: Brain: There is no mass, hemorrhage or extra-axial collection. The size and configuration of the ventricles and extra-axial CSF spaces are normal. Subcortical hypoattenuation  within the right frontal lobe, likely a chronic ischemic insult. 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 Vascular: No abnormal hyperdensity of the major intracranial arteries or dural venous sinuses. No  intracranial atherosclerosis. Skull: The visualized skull base, calvarium and extracranial soft tissues are normal. Sinuses/Orbits: No fluid levels or advanced mucosal thickening of the visualized paranasal sinuses. No mastoid or middle ear effusion. The orbits are normal. ASPECTS [facility] Stroke Program Early CT Score) - Ganglionic level infarction (caudate, lentiform nuclei, internal capsule, insula, M1-M3 cortex): 7 - Supraganglionic infarction (M4-M6 cortex): 3 Total score (0-10 with 10 being normal): 10 IMPRESSION: 1. No acute intracranial abnormality. 2. ASPECTS is 10. 3. 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 These results were communicated to Dr. Lesleigh Noe at 3:32 am on 01/22/2021 by text page via the [facility] messaging system. Electronically Signed   By: Ulyses Jarred M.D.   On: 01/22/2021 03:33    Procedures Procedures  CRITICAL CARE Performed by: Delora Fuel Total critical care time: 35 minutes Critical care time was exclusive of separately billable procedures and treating other patients. Critical care was necessary to treat or prevent imminent or life-threatening deterioration. Critical care was time spent personally by me on the following activities: development of treatment plan with patient and/or surrogate as well as nursing, discussions with consultants, evaluation of patient's response to treatment, examination of patient, obtaining history from patient or surrogate, ordering and performing treatments and interventions, ordering and review of laboratory studies, ordering and review of radiographic studies, pulse oximetry and re-evaluation of patient's condition.  Medications Ordered in ED Medications  iohexol (OMNIPAQUE) 350 MG/ML injection 100 mL (100 mLs Intravenous Contrast Given 01/22/21 0345)    ED Course  I have reviewed the triage vital signs and the nursing notes.  Pertinent labs & imaging results that were available during my care  of the patient were reviewed by me and considered in my medical decision making (see chart for details).  MDM Rules/Calculators/A&P Transient aphasia most consistent with transient ischemic attack.  Emergent CT of head showed chronic ischemic changes but no acute changes.  CT angiogram of head and neck showed atherosclerotic disease which was not hemodynamically significant.  MRI has been ordered.  He will need to be admitted to complete TIA work-up.  Case is discussed with Dr. Olevia Bowens of [facility], who agrees to admit the patient.  ECG shows PACs and PVCs but no acute changes.  CBC and metabolic panel are normal.  Final Clinical Impression(s) / ED Diagnoses Final diagnoses:  TIA (transient ischemic attack)  Chronic anticoagulation    Rx / DC Orders ED Discharge Orders    None       Delora Fuel, MD 29/79/89 [phone]

## 2021-01-22 NOTE — ED Triage Notes (Signed)
Patient arrives from home with [facility], per report, patient went to bed at 0100, woke up at [place] altered with aphasia and dysarthria, symptoms began to resolved en-route per EMS, alert to self but confused to date/year.

## 2021-01-22 NOTE — Progress Notes (Signed)
EEG complete - results pending 

## 2021-01-22 NOTE — Consult Note (Signed)
Neurology Consultation Reason for Consult: Code stroke Requesting Physician: Tennis Must  CC: Aphasia  History is obtained from: Patient and EMS, and wife   HPI: Patrick Fritz is a 70 y.o. male with a past medical history significant for hypertension, hyperlipidemia, impaired fasting glucose, obstructive sleep apnea, DVT/PE on chronic anticoagulation (Xarelto), degenerative disc disease.  He was in his usual state of health last conversing normally with his wife at about 12:30 PM.  She went to take a shower and returned at about 1 AM and noted that he was speaking a little strangely.  Initially she thought he was just joking and in a few minutes his speech did return to his normal baseline.  However later in the night when she tried to wake him up around 2:15 AM he was very difficult to arouse and he was not speaking any words.  On EMS arrival he was very aphasic, speech was slurred and he had a mild right facial droop.  The symptoms were rapidly improving in route to the ED  Regarding his cardiac history he notes that he is on Xarelto and was told he could stop his aspirin when he was started on Xarelto.  However on review of cardiology note, 08/20/2020 aspirin was reduced from daily to 3 times/week.  Patient/wife also note he has also been reduced from taking Lasix daily to taking it only 3 times a week; possibly confused instructions for aspirin with instructions from Lasix at this visit  LKW: 12:30 PM tPA given?: No, on therapeutic anticoagulation and symptoms resolved Premorbid modified rankin scale:      0 - No symptoms.  ROS: All other review of systems was negative except as noted in the HPI or below: He has been having more frequent dizzy spells, typically in the setting of standing from a seated position or from lying down, but sometimes while he is walking.  He denies any other cardiac symptoms, stable orthopnea, stable lower extremity edema, attributes slight difficulty with his  breathing/cough/drainage due to seasonal allergies.  Has received both of his Covid vaccines as well as a booster shot about 1 month ago.  No signs or symptoms of bleeding including no hematuria or blood in his stool.  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  He stays very active although he is retired.  Past Medical History:  Diagnosis Date  . DJD (degenerative joint disease), lumbar   . DVT (deep venous thrombosis) ([place])   . Glaucoma    R eye diminished vision approx 50%  . High cholesterol   . Hyperlipidemia   . Hypertension   . IFG (impaired fasting glucose)   . OSA (obstructive sleep apnea)    Severe w AHI 34/hr on HST no on BiPAP at 19/15cm H2O  . PE (pulmonary embolism)    Second occurance,enknown etiology,lifelong anticoagulation   Past Surgical History:  Procedure Laterality Date  . CARDIAC CATHETERIZATION N/A 05/14/2016   Procedure: Left Heart Cath and Coronary Angiography;  Surgeon: Belva Crome, MD;  Location: [facility];  Service: Cardiovascular;  Laterality: N/A;  . 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 KNEE  SURGERY Bilateral    arthroscopic  . TOTAL KNEE ARTHROPLASTY Bilateral 1996   No current facility-administered medications for this encounter.  Current Outpatient Medications:  .  allopurinol (ZYLOPRIM) 300 MG tablet, Take 300 mg by mouth daily. , Disp: , Rfl:  .  aspirin EC 81 MG tablet, Take 1 tablet (81 mg total) by mouth every Monday, Wednesday, and Friday. Swallow whole., Disp: 90 tablet, Rfl: 3 .  folic acid (FOLVITE) 1 MG tablet, Take 1 tablet (1 mg total) by mouth daily., Disp: 30 tablet, Rfl: 1 .  furosemide (LASIX) 40 MG  tablet, Take 0.5 tablets (20 mg total) by mouth daily., Disp: 15 tablet, Rfl: 6 .  gabapentin (NEURONTIN) 100 MG capsule, Take 100 mg by mouth 2 (two) times daily., Disp: , Rfl:  .  hydroxychloroquine (PLAQUENIL) 200 MG tablet, Take 200 mg by mouth daily. Take with food or milk , Disp: , Rfl:  .  methotrexate 2.5 MG tablet, Take 1 tablet by mouth daily., Disp: , Rfl:  .  Multiple Vitamins-Minerals (OCUVITE EYE HEALTH FORMULA PO), Take 1 tablet by mouth daily., Disp: , Rfl:  .  potassium chloride SA (KLOR-CON) 20 MEQ tablet, Take 1 tablet (20 mEq total) by mouth every Monday, Wednesday, and Friday., Disp: 45 tablet, Rfl: 3 .  rivaroxaban (XARELTO) 20 MG TABS tablet, Take 1 tablet (20 mg total) by mouth daily with supper., Disp: 30 tablet, Rfl: 2 .  sertraline (ZOLOFT) 50 MG tablet, Take 50 mg by mouth daily., Disp: , Rfl:  .  simvastatin (ZOCOR) 40 MG tablet, Take 1 tablet (40 mg total) by mouth at bedtime., Disp: 90 tablet, Rfl: 3 .  traMADol (ULTRAM) 50 MG tablet, Take one to two (50 mg to 100 mg total) by mouth every six (6) hours as needed for pain., Disp: , Rfl:    Family History  Problem Relation Age of Onset  . Osteoporosis Mother   . Hypertension Father   . CAD Father     Social History:  reports that he has never smoked. He has never used smokeless tobacco. He reports current alcohol use. No history on file for drug use.  He is a former Buyer, retail   Exam: Current vital signs: BP (!) 157/91 (BP Location: Left Arm)   Pulse 86   Temp 98.9 F (37.2 C) (Oral)   Resp 19   SpO2 93%  Vital signs in last 24 hours: Temp:  [98.9 F (37.2 C)] 98.9 F (37.2 C) (04/09 0317) Pulse Rate:  [86] 86 (04/09 0317) Resp:  [19] 19 (04/09 0317) BP: (157)/(91) 157/91 (04/09 0317) SpO2:  [93 %] 93 % (04/09 0317)   Physical Exam  Constitutional: Appears well-developed and well-nourished.  Psych: Affect appropriate to situation, calm and cooperative Eyes: No scleral injection HENT: No  oropharyngeal obstruction.  MSK: no joint deformities.  Cardiovascular: Normal rate and regular rhythm.  Respiratory: Effort normal, non-labored breathing GI: Soft.  No distension. There is no tenderness.  Skin: Warm dry and intact visible skin  Neuro: Mental Status: Patient is awake, alert, oriented to person, place, month, year, and situation; initially was disoriented and could not name month and reported the year was 2021 Patient is able to give a clear and coherent history later in the course, initially did not have much recollection of what happened No signs of aphasia or neglect Cranial Nerves: II: Visual Fields are full. Pupils are equal, round, and reactive to light.   III,IV, VI: EOMI without ptosis or  diploplia.  V: Facial sensation is symmetric to light touch VII: Facial movement is symmetric.  VIII: hearing is intact to voice X: Uvula elevates symmetrically XI: Shoulder shrug is symmetric. XII: tongue is midline without atrophy or fasciculations.  Motor: Tone is normal. Bulk is normal. 5/5 strength was present in all four extremities.  Sensory: Sensation is symmetric to light touch and temperature in the arms and legs. Deep Tendon Reflexes: 2+ and symmetric in the biceps and patellae.  Plantars: Toes are downgoing bilaterally.  Cerebellar: FNF and HKS are intact bilaterally  NIHSS total 0  I have reviewed labs in epic and the results pertinent to this consultation are: Creatinine 1.06, GFR greater than 60 Normal CBC INR 1.9, PTT 21.2 Blood alcohol level undetectable Glucose 116  I have reviewed the images obtained: Head CT without acute intracranial process CTA with significant aortic atherosclerosis and other scattered atherosclerosis without severe or clinically significant stenoses  CT perfusion negative  Impression: 70 year old male with past medical history of multiple vascular risk factors as detailed above now presenting with transient neurological  symptoms concerning for an left MCA territory TIA versus potential seizure.  Given the gradual clearance of his aphasia we will also obtain EEG for potential seizure work-up  Recommendations: -MRI brain without contrast -Lipid panel, A1c, TSH to assess for additional modifiable risk factors -EEG -Echocardiogram though notably from a neurology perspective he is already optimized on long-term anticoagulation -Consider resuming aspirin 81 mg daily more from a cardiac perspective than neurology perspective; defer to cardiology in this regard -Stroke team to follow MRI and echocardiogram  Lesleigh Noe MD-PhD [facility] [phone]

## 2021-01-22 NOTE — Progress Notes (Signed)
PT Cancellation Note  Patient Details Name: MACON SANDIFORD MRN: [id number] DOB: 03/15/1951   Cancelled Treatment:    Reason Eval/Treat Not Completed: PT screened, no needs identified, will sign off. Patient demonstrates independent bed mobility, transfers and gait. No needs identified at this time. Will sign off.    Raegan Sipp 01/22/2021, 3:42 PM

## 2021-01-22 NOTE — Procedures (Signed)
Routine EEG  Duration:  24 min History: 70 year old male with possible TIA  Technique: This is a technically satisfactory eighteen channel recording using the standard 10-20 system of electrode placement. There were no significant technical difficulties  Medications:  See emr  Report: At the onset of the EEG, the patient is awake. The background activity consists of 8-9Hz , persistent posteriorly dominant, moderate amplitude, symmetric, and rhythmic activity that is reactive to eye opening. Anteriorly, it consists of a mixture of low voltage indeterminant activity and 15-20Hz , persistent low amplitude, symmetric and rhythmic activity. Stepwise intermittent photic stimulation and hyperventilation was not done. Drowsiness is characterized by low amplitude mixed frequency activity, roving eye movements, and decreased eye blinking and muscle artifact. There was no focal slowing or epileptiform discharges seen. There were no seizures seen.    Impression: This is a normal awake and drowsy EEG. There is no evidence for focal slowing or epileptiform activity. A normal EEG does not rule out the possibility of epilepsy.

## 2021-01-22 NOTE — Progress Notes (Signed)
EEG attempted, pt went to MRI

## 2021-01-22 NOTE — Plan of Care (Signed)
  Problem: Education: Goal: Knowledge of General Education information will improve Description: Including pain rating scale, medication(s)/side effects and non-pharmacologic comfort measures 01/22/2021 1535 by Dorris Carnes, RN Outcome: Adequate for Discharge 01/22/2021 1534 by Dorris Carnes, RN Outcome: Progressing   Problem: Clinical Measurements: Goal: Ability to maintain clinical measurements within normal limits will improve 01/22/2021 1535 by Dorris Carnes, RN Outcome: Adequate for Discharge 01/22/2021 1534 by Dorris Carnes, RN Outcome: Progressing Goal: Will remain free from infection 01/22/2021 1535 by Dorris Carnes, RN Outcome: Adequate for Discharge 01/22/2021 1534 by Dorris Carnes, RN Outcome: Progressing Goal: Diagnostic test results will improve 01/22/2021 1535 by Dorris Carnes, RN Outcome: Adequate for Discharge 01/22/2021 1534 by Dorris Carnes, RN Outcome: Progressing Goal: Respiratory complications will improve 01/22/2021 1535 by Dorris Carnes, RN Outcome: Adequate for Discharge 01/22/2021 1534 by Dorris Carnes, RN Outcome: Progressing Goal: Cardiovascular complication will be avoided 01/22/2021 1535 by Dorris Carnes, RN Outcome: Adequate for Discharge 01/22/2021 1534 by Dorris Carnes, RN Outcome: Progressing   Problem: Activity: Goal: Risk for activity intolerance will decrease 01/22/2021 1535 by Dorris Carnes, RN Outcome: Adequate for Discharge 01/22/2021 1534 by Dorris Carnes, RN Outcome: Progressing   Problem: Nutrition: Goal: Adequate nutrition will be maintained 01/22/2021 1535 by Dorris Carnes, RN Outcome: Adequate for Discharge 01/22/2021 1534 by Dorris Carnes, RN Outcome: Progressing   Problem: Coping: Goal: Level of anxiety will decrease 01/22/2021 1535 by Dorris Carnes, RN Outcome: Adequate for Discharge 01/22/2021 1534 by Dorris Carnes, RN Outcome: Progressing   Problem: Pain Managment: Goal:  General experience of comfort will improve 01/22/2021 1535 by Dorris Carnes, RN Outcome: Adequate for Discharge 01/22/2021 1534 by Dorris Carnes, RN Outcome: Progressing   Problem: Skin Integrity: Goal: Risk for impaired skin integrity will decrease 01/22/2021 1535 by Dorris Carnes, RN Outcome: Adequate for Discharge 01/22/2021 1534 by Dorris Carnes, RN Outcome: Progressing   Problem: Ischemic Stroke/TIA Tissue Perfusion: Goal: Complications of ischemic stroke/TIA will be minimized 01/22/2021 1535 by Dorris Carnes, RN Outcome: Adequate for Discharge 01/22/2021 1534 by Dorris Carnes, RN Outcome: Progressing   Problem: Spontaneous Subarachnoid Hemorrhage Tissue Perfusion: Goal: Complications of Spontaneous Subarachnoid Hemorrhage will be minimized 01/22/2021 1535 by Dorris Carnes, RN Outcome: Adequate for Discharge 01/22/2021 1534 by Dorris Carnes, RN Outcome: Progressing

## 2021-01-22 NOTE — H&P (Signed)
History and Physical    Patrick Fritz FKC:[id number] DOB: April 06, 1951 DOA: 01/22/2021  PCP: Mayra Neer, MD   Patient coming from: Home.  I have personally briefly reviewed patient's old medical records in [place]  Chief Complaint: AMS.  HPI: Patrick Fritz is a 70 y.o. male with medical history significant of DJD of lumbar spine, frequent lower back pain, history of bilateral PE, history of DVT, glaucoma, hyperlipidemia, hypertension, impaired fasting glucose, OSA no longer on BiPAP, CAD/CABG who was brought to the emergency department via EMS after waking up earlier this morning around 0215 aphasic and dysarthric.  The patient and his wife state that they were visiting the patient's sister, returned home after 86 and then started watching a prerecorded baseball game.  His wife went to the shower and when she returned to watch the game, she asked him about what have happened while she was not watching and he begun to speak strangely.  She thought that he was joking and noticed that his speech returned to his usual baseline.  The patient went to bed around 0120.  Then, around [place] she noticed while sleeping he was not acting normal.  She tried to wake him up, but he was not waking up easily.  She how-to arousing vigorously for him to wake up and then she noticed that he was aphasic.  She called EMS.  When EMS arrived they also noticed that his speech was slower and he had a mild right facial droop.  However, while in the ambulance on the way to the ED his symptoms mostly disappeared.  He denies headache, vision changes, nausea or emesis.  No focal numbness or weakness.  However, he has postural dizziness in the last few months.  Denies fever, chills, rhinorrhea, but complains of an itchy sore throat, which he attributes to allergies.  No dyspnea, wheezing or hemoptysis.  Denies chest pain, diaphoresis, PND, but states he has had occasional palpitations and pitting edema of the lower  extremities.  He denies abdominal pain, diarrhea, constipation, melena or hematochezia.  No dysuria, frequency or hematuria, but complains of nocturia on the days he takes diuretics.  No polyuria, polydipsia, polyphagia or blurred vision.  He has been anxious, as he is a [place] war veteran and the news from the war in [place] has affected him.  ED Course: Initial vital signs were temperature 98.9 F, pulse 86, respiration 19, BP 157/91 mmHg and O2 sat 93% on room air.  Labwork: CBC was normal.  PT 21.2, INR 1.9 and PTT 33.  Alcohol level was normal.  CMP showed a glucose of 116 mg/dL, but all other values were normal (the patient had a snack dessert before going to bed.  Imaging: CT head code stroke did not show any acute intracranial normality.  There was a chronic ischemic insult in the right frontal lobe and right side insular region.  CTA head/neck did not show any emergent large vessel occlusion or high-grade stenosis of the intracranial arteries.  There was bilateral carotid bifurcation atherosclerosis without hemodynamically significant stenosis.  There was atherosclerosis of the aorta.  Please see images and full radiology report for further details.  Review of Systems: As per HPI otherwise all other systems reviewed and are negative.  Past Medical History:  Diagnosis Date  . DJD (degenerative joint disease), lumbar   . DVT (deep venous thrombosis) ([place])   . Glaucoma    R eye diminished vision approx 50%  . High  cholesterol   . Hyperlipidemia   . Hypertension   . IFG (impaired fasting glucose)   . OSA (obstructive sleep apnea)    Severe w AHI 34/hr on HST no on BiPAP at 19/15cm H2O  . PE (pulmonary embolism)    Second occurance,enknown etiology,lifelong anticoagulation   Past Surgical History:  Procedure Laterality Date  . CARDIAC CATHETERIZATION N/A 05/14/2016   Procedure: Left Heart Cath and Coronary Angiography;  Surgeon: Belva Crome, MD;  Location: [facility];   Service: Cardiovascular;  Laterality: N/A;  . CORONARY ARTERY BYPASS GRAFT N/A 05/14/2016   Procedure: CORONARY ARTERY BYPASS GRAFTING (CABG) times two with LIMA to LAD and right leg SVG to PDA,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;  Surgeon: Ivin Poot, MD;  Location: [facility];  Service: Open Heart Surgery;  Laterality: N/A;  . KNEE SURGERY Bilateral    arthroscopic  . TOTAL KNEE ARTHROPLASTY Bilateral 1996   Social History  reports that he has never smoked. He has never used smokeless tobacco. He reports current alcohol use. No history on file for drug use.  Allergies  Allergen Reactions  . Penicillins Anaphylaxis    Has patient had a PCN reaction causing immediate rash, facial/tongue/throat swelling, SOB or lightheadedness with hypotension: Yes Has patient had a PCN reaction causing severe rash involving mucus membranes or skin necrosis: No Has patient had a PCN reaction that required hospitalization Yes Has patient had a PCN reaction occurring within the last 10 years: No If all of the above answers are "NO", then may proceed with Cephalosporin use.    Family History  Problem Relation Age of Onset  . Osteoporosis Mother   . Hypertension Father   . CAD Father    Prior to Admission medications   Medication Sig Start Date End Date Taking? Authorizing Provider  allopurinol (ZYLOPRIM) 300 MG tablet Take 300 mg by mouth daily.     [provider]  aspirin EC 81 MG tablet Take 1 tablet (81 mg total) by mouth every Monday, Wednesday, and Friday. Swallow whole. 08/20/20   Belva Crome, MD  folic acid (FOLVITE) 1 MG tablet Take 1 tablet (1 mg total) by mouth daily. 05/23/16   Gold, Wayne E, PA-C  furosemide (LASIX) 40 MG tablet Take 0.5 tablets (20 mg total) by mouth daily. 08/20/20   Belva Crome, MD  gabapentin (NEURONTIN) 100 MG capsule Take 100 mg by mouth 2 (two) times daily.    [provider]  hydroxychloroquine (PLAQUENIL) 200 MG tablet Take 200 mg by  mouth daily. Take with food or milk     [provider]  methotrexate 2.5 MG tablet Take 1 tablet by mouth daily.    [provider]  Multiple Vitamins-Minerals (OCUVITE EYE HEALTH FORMULA PO) Take 1 tablet by mouth daily.    [provider]  potassium chloride SA (KLOR-CON) 20 MEQ tablet Take 1 tablet (20 mEq total) by mouth every Monday, Wednesday, and Friday. 08/20/20   Belva Crome, MD  rivaroxaban (XARELTO) 20 MG TABS tablet Take 1 tablet (20 mg total) by mouth daily with supper. 06/09/16   Isaiah Serge, NP  sertraline (ZOLOFT) 50 MG tablet Take 50 mg by mouth daily. 06/20/16   [provider]  simvastatin (ZOCOR) 40 MG tablet Take 1 tablet (40 mg total) by mouth at bedtime. 08/20/20   Belva Crome, MD  traMADol (ULTRAM) 50 MG tablet Take one to two (50 mg to 100 mg total)  by mouth every six (6) hours as needed for pain. 06/23/16   [provider]    Physical Exam: Vitals:   01/22/21 4540 01/22/21 0322 01/22/21 0354  BP: (!) 157/91    Pulse: 86    Resp: 19    Temp: 98.9 F (37.2 C)    TempSrc: Oral    SpO2: 93%    Weight:  102.1 kg 102.1 kg  Height:   5' 10" (1.778 m)    Constitutional: NAD, calm, comfortable Eyes: PERRL, lids and conjunctivae normal ENMT: Mucous membranes are moist. Posterior pharynx clear of any exudate or lesions. Neck: normal, supple, no masses, no thyromegaly Respiratory: clear to auscultation bilaterally, no wheezing, no crackles. Normal respiratory effort. No accessory muscle use.  Cardiovascular: Regular rate and rhythm, no murmurs / rubs / gallops. No extremity edema. 2+ pedal pulses. No carotid bruits.  Abdomen: Obese, no distention.  Bowel sounds positive.  Soft, no tenderness, no masses palpated. No hepatosplenomegaly. Musculoskeletal: no clubbing / cyanosis. Good ROM, no contractures. Normal muscle tone.  Skin: no acute rashes, lesions, ulcers on very limited dermatological examination. Neurologic: CN  2-12 grossly intact. Sensation intact, DTR normal. Strength 5/5 in all 4.  Psychiatric: Normal judgment and insight. Alert and oriented x 3. Normal mood.   Labs on Admission: I have personally reviewed following labs and imaging studies  CBC: Recent Labs  Lab 01/22/21 0315 01/22/21 0320  WBC 6.8  --   NEUTROABS 4.5  --   HGB 13.4 13.6  HCT 40.3 40.0  MCV 92.2  --   PLT 191  --     Basic Metabolic Panel: Recent Labs  Lab 01/22/21 0315 01/22/21 0320  NA 137 139  K 4.8 4.2  CL 103 104  CO2 24  --   GLUCOSE 116* 108*  BUN 17 20  CREATININE 1.06 1.00  CALCIUM 9.4  --     GFR: Estimated Creatinine Clearance: 81.1 mL/min (by C-G formula based on SCr of 1 mg/dL).  Liver Function Tests: Recent Labs  Lab 01/22/21 0315  AST 41  ALT 18  ALKPHOS 79  BILITOT 1.0  PROT 6.7  ALBUMIN 3.8    Urine analysis: No results found for: COLORURINE, APPEARANCEUR, LABSPEC, PHURINE, GLUCOSEU, HGBUR, BILIRUBINUR, KETONESUR, PROTEINUR, UROBILINOGEN, NITRITE, LEUKOCYTESUR  Radiological Exams on Admission: CT ANGIO HEAD W OR WO CONTRAST  Result Date: 01/22/2021 CLINICAL DATA:  Aphasia EXAM: CT ANGIOGRAPHY HEAD AND NECK CT PERFUSION BRAIN TECHNIQUE: Multidetector CT imaging of the head and neck was performed using the standard protocol during bolus administration of intravenous contrast. Multiplanar CT image reconstructions and MIPs were obtained to evaluate the vascular anatomy. Carotid stenosis measurements (when applicable) are obtained utilizing NASCET criteria, using the distal internal carotid diameter as the denominator. Multiphase CT imaging of the brain was performed following IV bolus contrast injection. Subsequent parametric perfusion maps were calculated using RAPID software. CONTRAST:  120m OMNIPAQUE IOHEXOL 350 MG/ML SOLN COMPARISON:  None. FINDINGS: CTA NECK FINDINGS SKELETON: There is no bony spinal canal stenosis. No lytic or blastic lesion. OTHER NECK: Normal pharynx, larynx and  major salivary glands. No cervical lymphadenopathy. Unremarkable thyroid gland. UPPER CHEST: No pneumothorax or pleural effusion. No nodules or masses. AORTIC ARCH: There is web-like atherosclerosis in the ascending aorta. Mild calcific atherosclerosis of the aortic arch. Conventional 3 vessel aortic branching pattern. The visualized proximal subclavian arteries are widely patent. RIGHT CAROTID SYSTEM: Mild atherosclerotic plaque at the carotid bifurcation and proximal ICA without hemodynamically significant stenosis. LEFT  CAROTID SYSTEM: No dissection, occlusion or aneurysm. There is mixed density atherosclerosis extending into the proximal ICA, resulting in less than 50% stenosis. VERTEBRAL ARTERIES: Left dominant configuration. Both origins are clearly patent. There is no dissection, occlusion or flow-limiting stenosis to the skull base (V1-V3 segments). CTA HEAD FINDINGS POSTERIOR CIRCULATION: --Vertebral arteries: Normal V4 segments. --Inferior cerebellar arteries: Normal. --Basilar artery: Normal. --Superior cerebellar arteries: Normal. --Posterior cerebral arteries (PCA): Normal. ANTERIOR CIRCULATION: --Intracranial internal carotid arteries: Normal. --Anterior cerebral arteries (ACA): Normal. Both A1 segments are present. Patent anterior communicating artery (a-comm). --Middle cerebral arteries (MCA): Normal. VENOUS SINUSES: As permitted by contrast timing, patent. ANATOMIC VARIANTS: None Review of the MIP images confirms the above findings. CT Brain Perfusion Findings: ASPECTS: 10 CBF (<30%) Volume: 32m Perfusion (Tmax>6.0s) volume: 037mMismatch Volume: 49m5mnfarction Location:None IMPRESSION: 1. No emergent large vessel occlusion or high-grade stenosis of the intracranial arteries. 2. Bilateral carotid bifurcation atherosclerosis without hemodynamically significant stenosis by NASCET criteria. 3. Web-like atherosclerosis in the ascending aorta. Aortic Atherosclerosis (ICD10-I70.0). Electronically Signed    By: KevUlyses JarredD.   On: 01/22/2021 03:56   CT ANGIO NECK W OR WO CONTRAST  Result Date: 01/22/2021 CLINICAL DATA:  Aphasia EXAM: CT ANGIOGRAPHY HEAD AND NECK CT PERFUSION BRAIN TECHNIQUE: Multidetector CT imaging of the head and neck was performed using the standard protocol during bolus administration of intravenous contrast. Multiplanar CT image reconstructions and MIPs were obtained to evaluate the vascular anatomy. Carotid stenosis measurements (when applicable) are obtained utilizing NASCET criteria, using the distal internal carotid diameter as the denominator. Multiphase CT imaging of the brain was performed following IV bolus contrast injection. Subsequent parametric perfusion maps were calculated using RAPID software. CONTRAST:  1049m51mNIPAQUE IOHEXOL 350 MG/ML SOLN COMPARISON:  None. FINDINGS: CTA NECK FINDINGS SKELETON: There is no bony spinal canal stenosis. No lytic or blastic lesion. OTHER NECK: Normal pharynx, larynx and major salivary glands. No cervical lymphadenopathy. Unremarkable thyroid gland. UPPER CHEST: No pneumothorax or pleural effusion. No nodules or masses. AORTIC ARCH: There is web-like atherosclerosis in the ascending aorta. Mild calcific atherosclerosis of the aortic arch. Conventional 3 vessel aortic branching pattern. The visualized proximal subclavian arteries are widely patent. RIGHT CAROTID SYSTEM: Mild atherosclerotic plaque at the carotid bifurcation and proximal ICA without hemodynamically significant stenosis. LEFT CAROTID SYSTEM: No dissection, occlusion or aneurysm. There is mixed density atherosclerosis extending into the proximal ICA, resulting in less than 50% stenosis. VERTEBRAL ARTERIES: Left dominant configuration. Both origins are clearly patent. There is no dissection, occlusion or flow-limiting stenosis to the skull base (V1-V3 segments). CTA HEAD FINDINGS POSTERIOR CIRCULATION: --Vertebral arteries: Normal V4 segments. --Inferior cerebellar arteries:  Normal. --Basilar artery: Normal. --Superior cerebellar arteries: Normal. --Posterior cerebral arteries (PCA): Normal. ANTERIOR CIRCULATION: --Intracranial internal carotid arteries: Normal. --Anterior cerebral arteries (ACA): Normal. Both A1 segments are present. Patent anterior communicating artery (a-comm). --Middle cerebral arteries (MCA): Normal. VENOUS SINUSES: As permitted by contrast timing, patent. ANATOMIC VARIANTS: None Review of the MIP images confirms the above findings. CT Brain Perfusion Findings: ASPECTS: 10 CBF (<30%) Volume: 49mL 74mfusion (Tmax>6.0s) volume: 49mL M59match Volume: 49mL In67mction Location:None IMPRESSION: 1. No emergent large vessel occlusion or high-grade stenosis of the intracranial arteries. 2. Bilateral carotid bifurcation atherosclerosis without hemodynamically significant stenosis by NASCET criteria. 3. Web-like atherosclerosis in the ascending aorta. Aortic Atherosclerosis (ICD10-I70.0). Electronically Signed   By: Kevin  Ulyses Jarred On: 01/22/2021 03:56   CT CEREBRAL PERFUSION W CONTRAST  Result Date: 01/22/2021 CLINICAL DATA:  Aphasia EXAM:  CT ANGIOGRAPHY HEAD AND NECK CT PERFUSION BRAIN TECHNIQUE: Multidetector CT imaging of the head and neck was performed using the standard protocol during bolus administration of intravenous contrast. Multiplanar CT image reconstructions and MIPs were obtained to evaluate the vascular anatomy. Carotid stenosis measurements (when applicable) are obtained utilizing NASCET criteria, using the distal internal carotid diameter as the denominator. Multiphase CT imaging of the brain was performed following IV bolus contrast injection. Subsequent parametric perfusion maps were calculated using RAPID software. CONTRAST:  161m OMNIPAQUE IOHEXOL 350 MG/ML SOLN COMPARISON:  None. FINDINGS: CTA NECK FINDINGS SKELETON: There is no bony spinal canal stenosis. No lytic or blastic lesion. OTHER NECK: Normal pharynx, larynx and major salivary glands.  No cervical lymphadenopathy. Unremarkable thyroid gland. UPPER CHEST: No pneumothorax or pleural effusion. No nodules or masses. AORTIC ARCH: There is web-like atherosclerosis in the ascending aorta. Mild calcific atherosclerosis of the aortic arch. Conventional 3 vessel aortic branching pattern. The visualized proximal subclavian arteries are widely patent. RIGHT CAROTID SYSTEM: Mild atherosclerotic plaque at the carotid bifurcation and proximal ICA without hemodynamically significant stenosis. LEFT CAROTID SYSTEM: No dissection, occlusion or aneurysm. There is mixed density atherosclerosis extending into the proximal ICA, resulting in less than 50% stenosis. VERTEBRAL ARTERIES: Left dominant configuration. Both origins are clearly patent. There is no dissection, occlusion or flow-limiting stenosis to the skull base (V1-V3 segments). CTA HEAD FINDINGS POSTERIOR CIRCULATION: --Vertebral arteries: Normal V4 segments. --Inferior cerebellar arteries: Normal. --Basilar artery: Normal. --Superior cerebellar arteries: Normal. --Posterior cerebral arteries (PCA): Normal. ANTERIOR CIRCULATION: --Intracranial internal carotid arteries: Normal. --Anterior cerebral arteries (ACA): Normal. Both A1 segments are present. Patent anterior communicating artery (a-comm). --Middle cerebral arteries (MCA): Normal. VENOUS SINUSES: As permitted by contrast timing, patent. ANATOMIC VARIANTS: None Review of the MIP images confirms the above findings. CT Brain Perfusion Findings: ASPECTS: 10 CBF (<30%) Volume: 03mPerfusion (Tmax>6.0s) volume: 9m16mismatch Volume: 9mL47mfarction Location:None IMPRESSION: 1. No emergent large vessel occlusion or high-grade stenosis of the intracranial arteries. 2. Bilateral carotid bifurcation atherosclerosis without hemodynamically significant stenosis by NASCET criteria. 3. Web-like atherosclerosis in the ascending aorta. Aortic Atherosclerosis (ICD10-I70.0). Electronically Signed   By: KeviUlyses Jarred.    On: 01/22/2021 03:56   CT HEAD CODE STROKE WO CONTRAST  Result Date: 01/22/2021 CLINICAL DATA:  Code stroke.  Aphasia and dysarthria EXAM: CT HEAD WITHOUT CONTRAST TECHNIQUE: Contiguous axial images were obtained from the base of the skull through the vertex without intravenous contrast. COMPARISON:  None. FINDINGS: Brain: There is no mass, hemorrhage or extra-axial collection. The size and configuration of the ventricles and extra-axial CSF spaces are normal. Subcortical hypoattenuation within the right frontal lobe, likely a chronic ischemic insult. 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 Vascular: No abnormal hyperdensity of the major intracranial arteries or dural venous sinuses. No intracranial atherosclerosis. Skull: The visualized skull base, calvarium and extracranial soft tissues are normal. Sinuses/Orbits: No fluid levels or advanced mucosal thickening of the visualized paranasal sinuses. No mastoid or middle ear effusion. The orbits are normal. ASPECTS ([facility] Early CT Score) - Ganglionic level infarction (caudate, lentiform nuclei, internal capsule, insula, M1-M3 cortex): 7 - Supraganglionic infarction (M4-M6 cortex): 3 Total score (0-10 with 10 being normal): 10 IMPRESSION: 1. No acute intracranial abnormality. 2. ASPECTS is 10. 3. 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 These results were communicated to Dr. SrisLesleigh Noe3:32 am on 01/22/2021 by text page via the [facility] system. Electronically Signed  By: Ulyses Jarred M.D.   On: 01/22/2021 03:33    EKG: Independently reviewed. Vent. rate 90 BPM PR interval 225 ms QRS duration 120 ms QT/QTcB 392/480 ms P-R-T axes 58 262 78 Sinus rhythm Multiple premature complexes, vent & supraven Prolonged PR interval Nonspecific IVCD with LAD Anterior infarct, old  Assessment/Plan Principal Problem:   TIA (transient ischemic  attack) Observation/telemetry. Frequent neurochecks. Allow permissive hypertension. Obtain fasting lipids and hemoglobin A1c. Check complete echocardiogram. Check electroencephalogram. Check MRI to brain without contrast. Deferred PT/OT/SLP as patient is back to his baseline. Neuro hospitalist team to follow EEG, MRI and echocardiogram  Active Problems:   History of DVT in adulthood   Bilateral pulmonary embolism (HCC) Continue rivaroxaban 20 mg p.o. daily.    Obstructive sleep apnea No longer on BiPAP. Per patient's wife, his snoring has almost disappeared. Continue lifestyle modifications and follow-up with PCP/sleep medicine.    Gout Continue allopurinol 300 mg p.o. daily.    Glaucoma Needs met reconsultation.    Hyperlipidemia Continue simvastatin 40 mg p.o. daily. Check fasting lipids    CAD (coronary artery disease) Continue aspirin per cardiology. Continue daily simvastatin.    Aortic atherosclerosis (HCC) Continue statin.    Hypertension Allow permissive hypertension 220/110 mmHg. Discussed briefly with the patient and his wife.  DVT prophylaxis: On rivaroxaban. Code Status:   Full code. Family Communication:  His wife [name] was present in the ED room. Disposition Plan:   Patient is from:  Home.  Anticipated DC to:  Home.  Anticipated DC date:  01/24/2020.  Anticipated DC barriers: Clinical status and pending work-up.  Consults called:  Neuro hospitalist team is following. Admission status:  Observation/telemetry.   Severity of Illness: High severity due to waking up with expressive a motor aphasia associated with a right facial droop.  The symptoms have improved significantly, but the patient will need to remain in the hospital for close monitoring and further work-up given high risk for CVA.  Reubin Milan MD [facility]  How to contact the [facility] Attending or Consulting provider [place] or covering provider during after hours [place], for  this patient?   1. Check the care team in [facility] and look for a) attending/consulting [facility] provider listed and b) the [facility] team listed 2. Log into [email or web address] and use [employer]'s universal password to access. If you do not have the password, please contact the hospital operator. 3. Locate the [facility] provider you are looking for under [facility] and page to a number that you can be directly reached. 4. If you still have difficulty reaching the provider, please page the [facility] (Director on Call) for the Hospitalists listed on amion for assistance.  01/22/2021, 5:08 AM   This document was prepared using [employer] and may contain some unintended transcription errors.

## 2021-01-22 NOTE — Progress Notes (Signed)
  Echocardiogram 2D Echocardiogram has been performed.  Patrick Fritz 01/22/2021, 10:51 AM

## 2021-01-22 NOTE — Code Documentation (Signed)
Paged for code stroke  LKW 0100 aphasic but had resolved upon arrival. NIH 1   CT head neg/CTA neg MRI ordered  Hx CVA on xarelto

## 2021-01-22 NOTE — Plan of Care (Signed)
  Problem: Education: Goal: Knowledge of General Education information will improve Description: Including pain rating scale, medication(s)/side effects and non-pharmacologic comfort measures Outcome: Progressing   Problem: Health Behavior/Discharge Planning: Goal: Ability to manage health-related needs will improve Outcome: Progressing   Problem: Clinical Measurements: Goal: Ability to maintain clinical measurements within normal limits will improve Outcome: Progressing Goal: Will remain free from infection Outcome: Progressing Goal: Diagnostic test results will improve Outcome: Progressing Goal: Respiratory complications will improve Outcome: Progressing Goal: Cardiovascular complication will be avoided Outcome: Progressing   Problem: Safety: Goal: Ability to remain free from injury will improve Outcome: Progressing   Problem: Ischemic Stroke/TIA Tissue Perfusion: Goal: Complications of ischemic stroke/TIA will be minimized Outcome: Progressing

## 2021-01-26 NOTE — Progress Notes (Signed)
Cardiology Office Note:    Date:  01/27/2021   ID:  Patrick Fritz, DOB October 17, 1950, MRN [id number]  PCP:  Mayra Neer, MD  Cardiologist:  Sinclair Grooms, MD   Referring MD: Mayra Neer, MD   Chief Complaint  Patient presents with  . Congestive Heart Failure  . Follow-up    TIA    History of Present Illness:    Patrick Fritz is a 70 y.o. male with a hx of recurrent DVT, history of PE, on chronic Xarelto therapy as prophylaxis against DVT OSA,CAD requiring emergency CABG following 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 Surgery included bypass grafting and replacement of the ascending aorta by Dr. PVT.Right arm numbness post CABG.  The patient presented with an acute MI in 0017 complicated by aortic dissection requiring emergency coronary bypass surgery of right coronary and repair of dissection.  New finding on echo performed in April 2022 demonstrates EF 30 to 35%.  The previous EF was 45 to 50% in 2017.  He has some dyspnea on exertion.  He does not have orthopnea.  No significant lower extremity swelling.  After bypass surgery he did have difficulty with left lower extremity swelling.  This has not been a particular issue.  More recently he has felt lightheaded or dizzy when he stoops bends and then straightens up.  The neurological event was associated with dysarthria.  That has resolved.  Past Medical History:  Diagnosis Date  . DJD (degenerative joint disease), lumbar   . DVT (deep venous thrombosis) ([place])   . Glaucoma    R eye diminished vision approx 50%  . High cholesterol   . Hyperlipidemia   . Hypertension   . IFG (impaired fasting glucose)   . OSA (obstructive sleep apnea)    Severe w AHI 34/hr on HST no on BiPAP at 19/15cm H2O  . PE (pulmonary embolism)    Second occurance,enknown etiology,lifelong anticoagulation    Past Surgical History:  Procedure Laterality Date  . CARDIAC CATHETERIZATION N/A 05/14/2016    Procedure: Left Heart Cath and Coronary Angiography;  Surgeon: Belva Crome, MD;  Location: [facility];  Service: Cardiovascular;  Laterality: N/A;  . CORONARY ARTERY BYPASS GRAFT N/A 05/14/2016   Procedure: CORONARY ARTERY BYPASS GRAFTING (CABG) times two with LIMA to LAD and right leg SVG to PDA,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;  Surgeon: Ivin Poot, MD;  Location: [place];  Service: Open Heart Surgery;  Laterality: N/A;  . KNEE SURGERY Bilateral    arthroscopic  . TOTAL KNEE ARTHROPLASTY Bilateral 1996    Current Medications: Current Meds  Medication Sig  . allopurinol (ZYLOPRIM) 300 MG tablet Take 300 mg by mouth daily.   . diclofenac Sodium (VOLTAREN) 1 % GEL Apply 1 application topically daily as needed for pain.  . furosemide (LASIX) 40 MG tablet Take 0.5 tablets (20 mg total) by mouth 3 (three) times a week.  . gabapentin (NEURONTIN) 100 MG capsule Take 100 mg by mouth 2 (two) times daily.  . potassium chloride SA (KLOR-CON) 20 MEQ tablet Take 1 tablet (20 mEq total) by mouth every Monday, Wednesday, and Friday.  . rivaroxaban (XARELTO) 20 MG TABS tablet Take 1 tablet (20 mg total) by mouth daily with supper.  . sertraline (ZOLOFT) 50 MG tablet Take 150 mg by mouth daily.  . simvastatin (ZOCOR) 40 MG tablet Take 1 tablet (40 mg total) by mouth at bedtime.  . traMADol Veatrice Bourbon)  50 MG tablet Take 50-100 mg by mouth at bedtime.     Allergies:   Penicillins   Social History   Socioeconomic History  . Marital status: Married    Spouse name: Not on file  . Number of children: Not on file  . Years of education: Not on file  . Highest education level: Not on file  Occupational History  . Not on file  Tobacco Use  . Smoking status: Never Smoker  . Smokeless tobacco: Never Used  Substance and Sexual Activity  . Alcohol use: Yes    Comment: 1-2 per week  . Drug use: Not on file  . Sexual activity: Not on file  Other Topics Concern  . Not on file   Social History Narrative  . Not on file   Social Determinants of Health   Financial Resource Strain: Not on file  Food Insecurity: Not on file  Transportation Needs: Not on file  Physical Activity: Not on file  Stress: Not on file  Social Connections: Not on file     Family History: The patient's family history includes CAD in his father; Hypertension in his father; Osteoporosis in his mother.  ROS:   Please see the history of present illness.    He has been on long-term Xarelto therapy.  Dr. Brigitte Pulse asked him to stop taking it.  All other systems reviewed and are negative.  EKGs/Labs/Other Studies Reviewed:    The following studies were reviewed today: 2D ECHOCARDIOGRAM 2022 IMPRESSIONS    1. Septal and inferior wall hypokinesis . Left ventricular ejection  fraction, by estimation, is 30 to 35%. The left ventricle has moderately  decreased function. The left ventricle demonstrates regional wall motion  abnormalities (see scoring  diagram/findings for description). The left ventricular internal cavity  size was mildly dilated. Left ventricular diastolic parameters were  normal.  2. Right ventricular systolic function is normal. The right ventricular  size is normal. There is normal pulmonary artery systolic pressure.  3. Left atrial size was moderately dilated.  4. The mitral valve is abnormal. Mild mitral valve regurgitation. No  evidence of mitral stenosis.  5. The aortic valve is tricuspid. There is moderate calcification of the  aortic valve. Aortic valve regurgitation is mild. Mild aortic valve  sclerosis is present, with no evidence of aortic valve stenosis.  6. The inferior vena cava is normal in size with greater than 50%  respiratory variability, suggesting right atrial pressure of 3 mmHg.   EKG:  EKG performed 01/24/2021 demonstrates first-degree AV block, interventricular conduction delay, poor R wave progression.  Recent Labs: 01/22/2021: ALT 18; BUN 20;  Creatinine, Ser 1.00; Hemoglobin 13.6; Platelets 191; Potassium 4.2; Sodium 139; TSH 2.532  Recent Lipid Panel    Component Value Date/Time   CHOL 184 01/22/2021 0430   CHOL 165 10/21/2020 0932   TRIG 472 (H) 01/22/2021 0430   HDL 34 (L) 01/22/2021 0430   HDL 49 10/21/2020 0932   CHOLHDL 5.4 01/22/2021 0430   VLDL UNABLE TO CALCULATE IF TRIGLYCERIDE OVER 400 mg/dL 01/22/2021 0430   LDLCALC UNABLE TO CALCULATE IF TRIGLYCERIDE OVER 400 mg/dL 01/22/2021 0430   LDLCALC 97 10/21/2020 0932   LDLDIRECT 55.6 01/22/2021 0430    Physical Exam:    VS:  BP 100/60 (BP Location: Left Arm, Patient Position: Sitting, Cuff Size: Normal)   Pulse 79   Ht 5\' 10"  (1.778 m)   Wt 219 lb (99.3 kg)   SpO2 97%   BMI 31.42 kg/m  Wt Readings from Last 3 Encounters:  01/27/21 219 lb (99.3 kg)  01/22/21 225 lb 1.4 oz (102.1 kg)  08/20/20 222 lb 12.8 oz (101.1 kg)     GEN: Obesity. No acute distress HEENT: Normal NECK: No JVD. LYMPHATICS: No lymphadenopathy CARDIAC: No murmur. RRR no gallop, or edema. VASCULAR:  Normal Pulses. No bruits.  Varicose veins left lower extremity greater than right RESPIRATORY:  Clear to auscultation without rales, wheezing or rhonchi  ABDOMEN: Soft, non-tender, non-distended, No pulsatile mass, MUSCULOSKELETAL: No deformity  SKIN: Warm and dry NEUROLOGIC:  Alert and oriented x 3 PSYCHIATRIC:  Normal affect   ASSESSMENT:    1. S/P CABG x 2   2. Chronic systolic heart failure ([place])   3. Hx of ascending aorta replacement   4. Bilateral pulmonary embolism ([place])   5. Hyperlipidemia LDL goal <70   6. Chronic anticoagulation   7. Obstructive sleep apnea   8. Educated about COVID-19 virus infection    PLAN:    In order of problems listed above:  1. He is not having angina.  Secondary prevention discussed. 2. LVEF has slipped below 35%.  We will institute guideline directed therapy for systolic dysfunction.  Discontinue Lasix and potassium supplementation.  Start  spironolactone 12.5 mg daily.  Basic metabolic panel in 7 to 10 days.  After 1 week of spironolactone, if he is feeling well, start carvedilol 3.125 mg twice daily.  Clinical follow-up in the office in 3 to 4 weeks.  At that time we will consider initiating an ACE inhibitor/ARB/or Entresto. 3. We did not discuss aortic dissection repair. 4. Prior history of bilateral pulmonary emboli.  Need to clarify with Dr. Brigitte Pulse if she actually meant to discontinue Xarelto. 5. Continue Zocor 40 mg/day 6. Should probably be back on anticoagulation with the Xarelto unless there is some reason to discontinue it. 7. If possible, wear CPAP.  Guideline directed therapy for left ventricular systolic dysfunction: Angiotensin receptor-neprilysin inhibitor (ARNI)-Entresto; beta-blocker therapy - carvedilol, metoprolol succinate, or bisoprolol; mineralocorticoid receptor antagonist (MRA) therapy -spironolactone or eplerenone.  SGLT-2 agents -  Dapagliflozin Wilder Glade) or Empagliflozin (Jardiance).These therapies have been shown to improve clinical outcomes including reduction of rehospitalization, survival, and acute heart failure.    Medication Adjustments/Labs and Tests Ordered: Current medicines are reviewed at length with the patient today.  Concerns regarding medicines are outlined above.  No orders of the defined types were placed in this encounter.  No orders of the defined types were placed in this encounter.   There are no Patient Instructions on file for this visit.   Signed, Sinclair Grooms, MD  01/27/2021 2:15 PM    [facility]

## 2021-01-27 ENCOUNTER — Encounter: Payer: Self-pay | Admitting: Interventional Cardiology

## 2021-01-27 ENCOUNTER — Other Ambulatory Visit: Payer: Self-pay

## 2021-01-27 ENCOUNTER — Ambulatory Visit: Payer: Medicare Other | Admitting: Interventional Cardiology

## 2021-01-27 VITALS — BP 100/60 | HR 79 | Ht 70.0 in | Wt 219.0 lb

## 2021-01-27 DIAGNOSIS — I5022 Chronic systolic (congestive) heart failure: Secondary | ICD-10-CM | POA: Diagnosis not present

## 2021-01-27 DIAGNOSIS — Z7901 Long term (current) use of anticoagulants: Secondary | ICD-10-CM | POA: Diagnosis not present

## 2021-01-27 DIAGNOSIS — G4733 Obstructive sleep apnea (adult) (pediatric): Secondary | ICD-10-CM

## 2021-01-27 DIAGNOSIS — I2510 Atherosclerotic heart disease of native coronary artery without angina pectoris: Secondary | ICD-10-CM | POA: Diagnosis not present

## 2021-01-27 DIAGNOSIS — Z95828 Presence of other vascular implants and grafts: Secondary | ICD-10-CM

## 2021-01-27 DIAGNOSIS — I509 Heart failure, unspecified: Secondary | ICD-10-CM | POA: Diagnosis not present

## 2021-01-27 DIAGNOSIS — I2699 Other pulmonary embolism without acute cor pulmonale: Secondary | ICD-10-CM | POA: Diagnosis not present

## 2021-01-27 DIAGNOSIS — Z7189 Other specified counseling: Secondary | ICD-10-CM

## 2021-01-27 DIAGNOSIS — Z951 Presence of aortocoronary bypass graft: Secondary | ICD-10-CM | POA: Diagnosis not present

## 2021-01-27 DIAGNOSIS — D6869 Other thrombophilia: Secondary | ICD-10-CM | POA: Diagnosis not present

## 2021-01-27 DIAGNOSIS — Z8673 Personal history of transient ischemic attack (TIA), and cerebral infarction without residual deficits: Secondary | ICD-10-CM | POA: Diagnosis not present

## 2021-01-27 DIAGNOSIS — E785 Hyperlipidemia, unspecified: Secondary | ICD-10-CM

## 2021-01-27 DIAGNOSIS — I2782 Chronic pulmonary embolism: Secondary | ICD-10-CM | POA: Diagnosis not present

## 2021-01-27 MED ORDER — CARVEDILOL 3.125 MG PO TABS: 3.1250 mg | ORAL_TABLET | Freq: Two times a day (BID) | ORAL | 3 refills | Status: DC

## 2021-01-27 MED ORDER — SPIRONOLACTONE 25 MG PO TABS: 12.5000 mg | ORAL_TABLET | Freq: Every day | ORAL | 3 refills | Status: DC

## 2021-01-27 NOTE — Patient Instructions (Signed)
Medication Instructions:  1) DISCONTINUE Furosemide and Potassium 2) START Spironolactone 12.5mg  once daily 3) After 1 week of being on Spironolactone, START Carvedilol 3.125mg  twice daily. 4) Talk to Dr. Brigitte Pulse and find out when you can restart your Xarelto.  *If you need a refill on your cardiac medications before your next appointment, please call your pharmacy*   Lab Work: BMET in 10-14 days  If you have labs (blood work) drawn today and your tests are completely normal, you will receive your results only by: Marland Kitchen MyChart Message (if you have MyChart) OR . A paper copy in the mail If you have any lab test that is abnormal or we need to change your treatment, we will call you to review the results.   Testing/Procedures: None   Follow-Up: At [facility], you and your health needs are our priority.  As part of our continuing mission to provide you with exceptional heart care, we have created designated Provider Care Teams.  These Care Teams include your primary Cardiologist (physician) and Advanced Practice Providers (APPs -  Physician Assistants and Nurse Practitioners) who all work together to provide you with the care you need, when you need it.  We recommend signing up for the patient portal called "MyChart".  Sign up information is provided on this After Visit Summary.  MyChart is used to connect with patients for Virtual Visits (Telemedicine).  Patients are able to view lab/test results, encounter notes, upcoming appointments, etc.  Non-urgent messages can be sent to your provider as well.   To learn more about what you can do with MyChart, go to [email or web address].    Your next appointment:   3-4 week(s)  The format for your next appointment:   In Person  Provider:   You may see Sinclair Grooms, MD or one of the following Advanced Practice Providers on your designated Care Team:    Kathyrn Drown, NP    Other Instructions

## 2021-02-09 ENCOUNTER — Other Ambulatory Visit: Payer: Medicare Other

## 2021-02-11 ENCOUNTER — Other Ambulatory Visit: Payer: Medicare Other | Admitting: *Deleted

## 2021-02-11 ENCOUNTER — Other Ambulatory Visit: Payer: Self-pay

## 2021-02-11 DIAGNOSIS — I5022 Chronic systolic (congestive) heart failure: Secondary | ICD-10-CM | POA: Diagnosis not present

## 2021-02-11 LAB — BASIC METABOLIC PANEL
BUN/Creatinine Ratio: 14 (ref 10–24)
BUN: 13 mg/dL (ref 8–27)
CO2: 25 mmol/L (ref 20–29)
Calcium: 9 mg/dL (ref 8.6–10.2)
Chloride: 104 mmol/L (ref 96–106)
Creatinine, Ser: 0.93 mg/dL (ref 0.76–1.27)
Glucose: 113 mg/dL — ABNORMAL HIGH (ref 65–99)
Potassium: 3.7 mmol/L (ref 3.5–5.2)
Sodium: 142 mmol/L (ref 134–144)
eGFR: 87 mL/min/{1.73_m2} (ref >59)

## 2021-02-20 NOTE — Progress Notes (Signed)
Cardiology Office Note:    Date:  02/21/2021   ID:  Patrick Fritz, DOB Aug 12, 1951, MRN [id number]  PCP:  Mayra Neer, MD  Cardiologist:  Sinclair Grooms, MD   Referring MD: Mayra Neer, MD   Chief Complaint  Patient presents with  . Congestive Heart Failure  . Coronary Artery Disease    History of Present Illness:    Patrick Fritz is a 70 y.o. male with a hx of recurrent DVT, history of PE, on chronic Xarelto therapy as prophylaxis against DVT OSA,CAD requiring emergency CABG following 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 Surgeryincludedbypass grafting and replacement of the ascending aorta by Dr. PVT.Right arm numbness post CABG.  He has been out of Xarelto for 4 days.  After recent medication change discontinuing furosemide and starting spironolactone, he noticed some mild right lower extremity edema.  No left lower extremity edema.  Last week, he was working on the yard, and after some prolonged squatting he stood up and fainted.  He gets dizzy frequently if he stands too long.  Otherwise no complaints.  Has been noticing some progressive shortness of breath over the past 6 to 8 months.  Denies orthopnea.  Breathing is no worse than when I last saw him.  Past Medical History:  Diagnosis Date  . DJD (degenerative joint disease), lumbar   . DVT (deep venous thrombosis) ([place])   . Glaucoma    R eye diminished vision approx 50%  . High cholesterol   . Hyperlipidemia   . Hypertension   . IFG (impaired fasting glucose)   . OSA (obstructive sleep apnea)    Severe w AHI 34/hr on HST no on BiPAP at 19/15cm H2O  . PE (pulmonary embolism)    Second occurance,enknown etiology,lifelong anticoagulation    Past Surgical History:  Procedure Laterality Date  . CARDIAC CATHETERIZATION N/A 05/14/2016   Procedure: Left Heart Cath and Coronary Angiography;  Surgeon: Belva Crome, MD;  Location: [place] [facility];  Service: Cardiovascular;   Laterality: N/A;  . CORONARY ARTERY BYPASS GRAFT N/A 05/14/2016   Procedure: CORONARY ARTERY BYPASS GRAFTING (CABG) times two with LIMA to LAD and right leg SVG to PDA,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;  Surgeon: Ivin Poot, MD;  Location: [place];  Service: Open Heart Surgery;  Laterality: N/A;  . KNEE SURGERY Bilateral    arthroscopic  . TOTAL KNEE ARTHROPLASTY Bilateral 1996    Current Medications: Current Meds  Medication Sig  . allopurinol (ZYLOPRIM) 300 MG tablet Take 300 mg by mouth daily.   . carvedilol (COREG) 3.125 MG tablet Take 1 tablet (3.125 mg total) by mouth 2 (two) times daily.  . diclofenac Sodium (VOLTAREN) 1 % GEL Apply 1 application topically daily as needed for pain.  Marland Kitchen gabapentin (NEURONTIN) 100 MG capsule Take 100 mg by mouth 2 (two) times daily.  . rivaroxaban (XARELTO) 20 MG TABS tablet Take 1 tablet (20 mg total) by mouth daily with supper.  . sertraline (ZOLOFT) 50 MG tablet Take 150 mg by mouth daily.  . simvastatin (ZOCOR) 40 MG tablet Take 1 tablet (40 mg total) by mouth at bedtime.  Marland Kitchen spironolactone (ALDACTONE) 25 MG tablet Take 0.5 tablets (12.5 mg total) by mouth daily.  . traMADol (ULTRAM) 50 MG tablet Take 50-100 mg by mouth at bedtime.     Allergies:   Penicillins   Social History   Socioeconomic History  . Marital status: Married  Spouse name: Not on file  . Number of children: Not on file  . Years of education: Not on file  . Highest education level: Not on file  Occupational History  . Not on file  Tobacco Use  . Smoking status: Never Smoker  . Smokeless tobacco: Never Used  Vaping Use  . Vaping Use: Never used  Substance and Sexual Activity  . Alcohol use: Yes    Comment: 1-2 per week  . Drug use: Not on file  . Sexual activity: Not on file  Other Topics Concern  . Not on file  Social History Narrative  . Not on file   Social Determinants of Health   Financial Resource Strain: Not on file  Food Insecurity:  Not on file  Transportation Needs: Not on file  Physical Activity: Not on file  Stress: Not on file  Social Connections: Not on file     Family History: The patient's family history includes CAD in his father; Hypertension in his father; Osteoporosis in his mother.  ROS:   Please see the history of present illness.    History of DVT and PE.  Made him aware that he cannot stop Xarelto.  He is on chronic therapy.  All other systems reviewed and are negative.  EKGs/Labs/Other Studies Reviewed:    The following studies were reviewed today: No new  EKG:  EKG not repeated  Recent Labs: 01/22/2021: ALT 18; Hemoglobin 13.6; Platelets 191; TSH 2.532 02/11/2021: BUN 13; Creatinine, Ser 0.93; Potassium 3.7; Sodium 142  Recent Lipid Panel    Component Value Date/Time   CHOL 184 01/22/2021 0430   CHOL 165 10/21/2020 0932   TRIG 472 (H) 01/22/2021 0430   HDL 34 (L) 01/22/2021 0430   HDL 49 10/21/2020 0932   CHOLHDL 5.4 01/22/2021 0430   VLDL UNABLE TO CALCULATE IF TRIGLYCERIDE OVER 400 mg/dL 01/22/2021 0430   LDLCALC UNABLE TO CALCULATE IF TRIGLYCERIDE OVER 400 mg/dL 01/22/2021 0430   LDLCALC 97 10/21/2020 0932   LDLDIRECT 55.6 01/22/2021 0430    Physical Exam:    VS:  BP 130/72   Pulse 85   Ht 5\' 10"  (1.778 m)   Wt 223 lb 9.6 oz (101.4 kg)   SpO2 98%   BMI 32.08 kg/m     Wt Readings from Last 3 Encounters:  02/21/21 223 lb 9.6 oz (101.4 kg)  01/27/21 219 lb (99.3 kg)  01/22/21 225 lb 1.4 oz (102.1 kg)     GEN: Obese. No acute distress HEENT: Normal NECK: No JVD. LYMPHATICS: No lymphadenopathy CARDIAC: No murmur. RRR no gallop, or edema. VASCULAR:  Normal Pulses. No bruits. RESPIRATORY:  Clear to auscultation without rales, wheezing or rhonchi  ABDOMEN: Soft, non-tender, non-distended, No pulsatile mass, MUSCULOSKELETAL: No deformity  SKIN: Warm and dry NEUROLOGIC:  Alert and oriented x 3 PSYCHIATRIC:  Normal affect   ASSESSMENT:    1. Chronic systolic heart  failure ([place])   2. S/P CABG x 2   3. Hx of ascending aorta replacement   4. Bilateral pulmonary embolism ([place])   5. Hyperlipidemia LDL goal <70   6. Chronic anticoagulation   7. Obstructive sleep apnea    PLAN:    In order of problems listed above:  1. Now on low-dose carvedilol 3.125 mg twice daily, spironolactone 12.5 mg p.o. daily, and has slight orthostasis with blood pressure going from 125/65-110/55 mmHg when standing.  Intention today was to start [facility].  Instead we will start Farxiga 5 mg/day (samples given).  If dizziness or low blood pressure will decrease spironolactone to 12.5 mg on Monday, Wednesday, and Friday.  7 to 10-day bmet will be done.  Follow-up in 4 weeks for repeat clinical assessment and determination about adding ARB or Arni.  Able to tolerate 12.5 mg of losartan initially.  Knee-high compression stockings were recommended. 2. Secondary prevention discussed. 3. No direct comment. 4. History of DVT and recurrent pulmonary emboli.  Ran out of of Xarelto.  We will give him samples until until he gets his supply from the [place]. 5. Continue Zocor 40 mg/day and probably need to increase intensity of therapy. 6. Samples of Xarelto given because he ran out.  4-week clinical follow-up.  Call if lightheadedness or dizziness.  Medication Adjustments/Labs and Tests Ordered: Current medicines are reviewed at length with the patient today.  Concerns regarding medicines are outlined above.  No orders of the defined types were placed in this encounter.  No orders of the defined types were placed in this encounter.   There are no Patient Instructions on file for this visit.   Signed, Sinclair Grooms, MD  02/21/2021 10:54 AM    [place]

## 2021-02-21 ENCOUNTER — Ambulatory Visit: Payer: Medicare Other | Admitting: Interventional Cardiology

## 2021-02-21 ENCOUNTER — Other Ambulatory Visit: Payer: Self-pay

## 2021-02-21 ENCOUNTER — Encounter: Payer: Self-pay | Admitting: Interventional Cardiology

## 2021-02-21 VITALS — BP 130/72 | HR 85 | Ht 70.0 in | Wt 223.6 lb

## 2021-02-21 DIAGNOSIS — Z95828 Presence of other vascular implants and grafts: Secondary | ICD-10-CM | POA: Diagnosis not present

## 2021-02-21 DIAGNOSIS — E785 Hyperlipidemia, unspecified: Secondary | ICD-10-CM

## 2021-02-21 DIAGNOSIS — Z7901 Long term (current) use of anticoagulants: Secondary | ICD-10-CM

## 2021-02-21 DIAGNOSIS — I5022 Chronic systolic (congestive) heart failure: Secondary | ICD-10-CM | POA: Diagnosis not present

## 2021-02-21 DIAGNOSIS — G4733 Obstructive sleep apnea (adult) (pediatric): Secondary | ICD-10-CM

## 2021-02-21 DIAGNOSIS — Z951 Presence of aortocoronary bypass graft: Secondary | ICD-10-CM | POA: Diagnosis not present

## 2021-02-21 DIAGNOSIS — I2699 Other pulmonary embolism without acute cor pulmonale: Secondary | ICD-10-CM | POA: Diagnosis not present

## 2021-02-21 MED ORDER — DAPAGLIFLOZIN PROPANEDIOL 5 MG PO TABS: 5.0000 mg | ORAL_TABLET | Freq: Every day | ORAL | 3 refills | Status: DC

## 2021-02-21 NOTE — Patient Instructions (Signed)
Medication Instructions:  START farxiga 5 mg daily *If you need a refill on your cardiac medications before your next appointment, please call your pharmacy*   Lab Work: BMET in two weeks If you have labs (blood work) drawn today and your tests are completely normal, you will receive your results only by: Marland Kitchen MyChart Message (if you have MyChart) OR . A paper copy in the mail If you have any lab test that is abnormal or we need to change your treatment, we will call you to review the results.   Testing/Procedures: None   Follow-Up: At [facility], you and your health needs are our priority.  As part of our continuing mission to provide you with exceptional heart care, we have created designated Provider Care Teams.  These Care Teams include your primary Cardiologist (physician) and Advanced Practice Providers (APPs -  Physician Assistants and Nurse Practitioners) who all work together to provide you with the care you need, when you need it.  We recommend signing up for the patient portal called "MyChart".  Sign up information is provided on this After Visit Summary.  MyChart is used to connect with patients for Virtual Visits (Telemedicine).  Patients are able to view lab/test results, encounter notes, upcoming appointments, etc.  Non-urgent messages can be sent to your provider as well.   To learn more about what you can do with MyChart, go to [email or web address].    Your next appointment:   4 week(s)  The format for your next appointment:   In Person  Provider:   You may see Dr. Daneen Schick or one of the following Advanced Practice Providers on your designated Care Team:    Kathyrn Drown, NP    Other Instructions

## 2021-02-22 ENCOUNTER — Encounter: Payer: Self-pay | Admitting: Adult Health

## 2021-02-22 ENCOUNTER — Ambulatory Visit (INDEPENDENT_AMBULATORY_CARE_PROVIDER_SITE_OTHER): Payer: Medicare Other | Admitting: Adult Health

## 2021-02-22 VITALS — BP 114/67 | HR 62 | Ht 70.0 in | Wt 224.0 lb

## 2021-02-22 DIAGNOSIS — E782 Mixed hyperlipidemia: Secondary | ICD-10-CM

## 2021-02-22 DIAGNOSIS — Z7901 Long term (current) use of anticoagulants: Secondary | ICD-10-CM

## 2021-02-22 DIAGNOSIS — I10 Essential (primary) hypertension: Secondary | ICD-10-CM | POA: Diagnosis not present

## 2021-02-22 DIAGNOSIS — G459 Transient cerebral ischemic attack, unspecified: Secondary | ICD-10-CM

## 2021-02-22 NOTE — Progress Notes (Signed)
[facility] [address] [place]. [place] [address] [phone]       HOSPITAL FOLLOW UP NOTE  [name] Date of Birth:  02-08-1951 Medical Record Number:  [id number]   Reason for Referral:  hospital stroke follow up    SUBJECTIVE:   CHIEF COMPLAINT:  Chief Complaint  Patient presents with  . Follow-up    TR with spouse (jane) Pt is well, has some dizziness and passed out once. pain in R leg/calf muscle.     HPI:   Patrick Fritz is a 70 y.o. male with history of ofDJD of lumbar spine, frequent lower back pain, history of bilateral PE, history of DVT, glaucoma, hyperlipidemia, hypertension, impaired fasting glucose, OSA no longer on BiPAP, CAD/CABG (per patient required defibrillation during CABG) who presented  on 01/22/2021 with AMS and recent episode with dysarthria. Patient symptoms resolved by the time he presented to the ED and his AMS improved on his initial exam.  Personally reviewed hospitalization pertinent progress notes, lab work and imaging with summary provided.  CT head and MRI negative for acute stroke and symptoms likely in setting of TIA vs possible unwitnessed seizure with postictal confusion as symptoms lasted for approximately 2-2.5hrs and pt unable to recall episode.  EEG negative.  Recommended continuation of Xarelto for secondary stroke prevention with history of DVT and PE.  2D echo mild worsening of HFrEF from 40 to 45% in 2017 to now 30 to 35% -recommended outpatient follow-up with established cardiologist.  LDL 55.6 on simvastatin.  Evaluated by therapy and discharged home in stable condition without therapy needs.  Posterior circulation TIA vs unwitnessed seizure with post ictal presentation  Code Stroke : CT head No acute abnormality. Small vessel disease. ASPECTS 10.   CTA head & neck: No high grade stenosis of the intracranial arteries and no Hemodynamically significant stenosis of bilateral carotids  CT  perfusion: negative  MRI : No acute findings, chronic small vessel dx and small remote frontal lobe infarct  2D Echo: LVEF 30-35%, LV regional wall abnormalities (septal and inferior wall hypokinesis), Mild AVR, L atrium mildy enlarged, Mild MVR,  EEG no evidence of seizure activity  LDL 55.6  HgbA1c 5.6  VTE prophylaxis - SCDs  aspirin 81 mg daily and Xarelto (rivaroxaban) daily prior to admission will restart ,Xarelto (rivaroxaban) daily.  Additional aspirin not necessary from a stroke standpoint  Therapy recommendations: none  Disposition:  Home  Today, 02/22/2021, Patrick Fritz is being seen for hospital follow-up accompanied by his wife, Patrick Fritz  Doing well from stroke standpoint without new or reoccurring symptoms.  He does report occasional short-term memory difficulty or delayed recall but this has been on going C/o intermittent dizziness with one episode of passing out -cardiology aware who felt likely due to slight orthostasis with recent medication adjustments Also reports right leg calf and thigh pain as well as swelling that has been improving. Per wife, cardiology evaluated yesterday and no emergent concerns reported - defer ongoing monitoring to cardiology  Compliant on Xarelto and simvastatin without associated side effects Blood pressure today 114/67 - occasionally monitors at home   No further concerns at this time    ROS:   14 system review of systems performed and negative with exception of those listed in HPI  PMH:  Past Medical History:  Diagnosis Date  . DJD (degenerative joint disease), lumbar   . DVT (deep venous thrombosis) ([place])   . Glaucoma    R  eye diminished vision approx 50%  . High cholesterol   . Hyperlipidemia   . Hypertension   . IFG (impaired fasting glucose)   . OSA (obstructive sleep apnea)    Severe w AHI 34/hr on HST no on BiPAP at 19/15cm H2O  . PE (pulmonary embolism)    Second occurance,enknown etiology,lifelong anticoagulation     PSH:  Past Surgical History:  Procedure Laterality Date  . CARDIAC CATHETERIZATION N/A 05/14/2016   Procedure: Left Heart Cath and Coronary Angiography;  Surgeon: Belva Crome, MD;  Location: [place] [facility];  Service: Cardiovascular;  Laterality: N/A;  . 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 KNEE SURGERY Bilateral    arthroscopic  . TOTAL KNEE ARTHROPLASTY Bilateral 1996    Social History:  Social History   Socioeconomic History  . Marital status: Married    Spouse name: Not on file  . Number of children: Not on file  . Years of education: Not on file  . Highest education level: Not on file  Occupational History  . Not on file  Tobacco Use  . Smoking status: Never Smoker  . Smokeless tobacco: Never Used  Vaping Use  . Vaping Use: Never used  Substance and Sexual Activity  . Alcohol use: Yes    Comment: 1-2 per week  . Drug use: Not on file  . Sexual activity: Not on file  Other Topics Concern  . Not on file  Social History Narrative  . Not on file   Social Determinants of Health   Financial Resource Strain: Not on file  Food Insecurity: Not on file  Transportation Needs: Not on file  Physical Activity: Not on file  Stress: Not on file  Social Connections: Not on file  Intimate Partner Violence: Not on file    Family History:  Family History  Problem Relation Age of Onset  . Osteoporosis Mother   . Hypertension Father   . CAD Father     Medications:   Current Outpatient Medications on File Prior to Visit  Medication Sig Dispense Refill  . allopurinol (ZYLOPRIM) 300 MG tablet Take 300 mg by mouth daily.     . carvedilol (COREG) 3.125 MG tablet Take 1 tablet (3.125 mg total) by mouth 2 (two) times  daily. 180 tablet 3  . dapagliflozin propanediol (FARXIGA) 5 MG TABS tablet Take 1 tablet (5 mg total) by mouth daily before breakfast. 90 tablet 3  . diclofenac Sodium (VOLTAREN) 1 % GEL Apply 1 application topically daily as needed for pain.    Marland Kitchen gabapentin (NEURONTIN) 100 MG capsule Take 100 mg by mouth 2 (two) times daily.    . rivaroxaban (XARELTO) 20 MG TABS tablet Take 1 tablet (20 mg total) by mouth daily with supper. 30 tablet 2  . sertraline (ZOLOFT) 50 MG tablet Take 150 mg by mouth daily.    . simvastatin (ZOCOR) 40 MG tablet Take 1 tablet (40 mg total) by mouth at bedtime. 90 tablet 3  . spironolactone (ALDACTONE) 25 MG tablet Take 0.5 tablets (12.5 mg total) by mouth daily. 45 tablet 3  . traMADol (ULTRAM) 50 MG tablet Take 50-100 mg by mouth at bedtime.     No current facility-administered medications on file prior  to visit.    Allergies:   Allergies  Allergen Reactions  . Penicillins Anaphylaxis    Has patient had a PCN reaction causing immediate rash, facial/tongue/throat swelling, SOB or lightheadedness with hypotension: Yes Has patient had a PCN reaction causing severe rash involving mucus membranes or skin necrosis: No Has patient had a PCN reaction that required hospitalization Yes Has patient had a PCN reaction occurring within the last 10 years: No If all of the above answers are "NO", then may proceed with Cephalosporin use.       OBJECTIVE:  Physical Exam  Vitals:   02/22/21 1312  BP: 114/67  Pulse: 62  Weight: 224 lb (101.6 kg)  Height: 5\' 10"  (1.778 m)   Body mass index is 32.14 kg/m. No exam data present  General: well developed, well nourished, pleasant elderly Caucasian male, seated, in no evident distress Head: head normocephalic and atraumatic.   Neck: supple with no carotid or supraclavicular bruits Cardiovascular: regular rate and rhythm, no murmurs Musculoskeletal: no deformity Skin:  no rash/petichiae Vascular:  Normal pulses all  extremities   Neurologic Exam Mental Status: Awake and fully alert.   Fluent speech and language.  Oriented to place and time. Recent memory subjectively mildly impaired and remote memory intact. Attention span, concentration and fund of knowledge appropriate. Mood and affect appropriate.  Cranial Nerves: Fundoscopic exam reveals sharp disc margins. Pupils equal, briskly reactive to light. Extraocular movements full without nystagmus. Visual fields full to confrontation. Hearing intact. Facial sensation intact. Face, tongue, palate moves normally and symmetrically.  Motor: Normal bulk and tone. Normal strength in all tested extremity muscles Sensory.: intact to touch , pinprick , position and vibratory sensation.  Coordination: Rapid alternating movements normal in all extremities. Finger-to-nose and heel-to-shin performed accurately bilaterally. Gait and Station: Arises from chair without difficulty. Stance is normal. Gait demonstrates normal stride length and balance without use of assistive device. Tandem walk and heel toe without difficulty.  Reflexes: 1+ and symmetric. Toes downgoing.     NIHSS  0 Modified Rankin  0      ASSESSMENT: Patrick Fritz is a 70 y.o. year old male with posterior circulation TIA vs unwitnessed seizure with postictal presentation on 01/22/2021 after presenting with altered mental status and dysarthria. Vascular risk factors include HLD, hx of PE and DVT on Xarelto, HTN, OSA not on BiPAP, CHF and CAD s/p CABG.      PLAN:  1. TIA vs seizure: Has not had any recurrence or new stroke/TIA symptoms.  Continue Xarelto (rivaroxaban) daily  and simvastatin for secondary stroke prevention.  Discussed secondary stroke prevention measures and importance of close PCP follow up for aggressive stroke risk factor management  2. HTN: BP goal <130/90.  Stable today with occasional dizziness with quick position changes -medications currently being adjusted by cardiology and  advised continued follow-up with their office 3. HLD: LDL goal <70. Recent LDL 55.6 on simvastatin 40 mg daily.  4. Hx of PE/DVT: [facility]    Follow up in 6 months or call earlier if needed   CC:  [place] provider: Dr. Leonie Man PCP: Mayra Neer, MD    I spent 47 minutes of face-to-face and non-face-to-face time with patient and wife.  This included previsit chart review including recent hospitalization pertinent progress notes, lab work and imaging with extensive review regarding recent episode possibly TIA vs seizures and further monitoring, secondary stroke prevention measures and importance of close PCP follow-up for aggressive stroke  risk factor management as well as compliance for all prescribed medications, history of PE/DVT and use of Xarelto chronically and answered all other questions to patient satisfaction  Patrick Fritz, AGNP-BC  [facility] [address] [place], [place] [address]  Phone [phone] Fax [phone] Note: This document was prepared with digital dictation and possible smart phrase technology. Any transcriptional errors that result from this process are unintentional.

## 2021-02-22 NOTE — Patient Instructions (Addendum)
Continue Xarelto (rivaroxaban) daily  and simvastatin for secondary stroke prevention  Continued routine follow-up with cardiology  Continue to follow up with PCP regarding cholesterol and blood pressure management  Maintain strict control of hypertension with blood pressure goal below 130/90 and cholesterol with LDL cholesterol (bad cholesterol) goal below 70 mg/dL.      Followup in the future with me in 6 months or call earlier if needed      Thank you for coming to see [place] at [facility]. I hope we have been able to provide you high quality care today.  You may receive a patient satisfaction survey over the next few weeks. We would appreciate your feedback and comments so that we may continue to improve ourselves and the health of our patients.    Cognitive Tips  Keep a journal/notebook with sections for the following (or use sections separately as needed):  Calendar and appointment sheet, schedule for each day, lists of reminders (such as grocery lists or "to do" list), homework assignments for therapy, important information such as family and friends names / addresses / phone numbers, medications, medical history and doctors name / phone numbers.  Avoid / remove clutter and unnecessary items from areas such as countertops / cabinets in kitchen and bathroom, closets, etc.  Organize items by purpose.  Baskets and bins help with this.  Leave notes for reminders above task to be completed.  For example: Note to turn off stove over the the stove; note to lock door beside the door, not to brush teeth then wash face by sink, note to take medication on table etc.  To help recall names of people of people or things, mentally or verbally go through the alphabet to try to determine the 1st letter of the word as this may trigger the name or word you are looking for.  If this is too difficult and someone else knows the word, have them give you the first letter by asking, "does  it start with an "A", "B", "C" etc. (or have them give you the first sound of the word or some other clue).  Review family events, occasions, names, etc.  Pictures are a good way to trigger memory.  Have others correct you if you answer something incorrectly.  Have them speak slowly with a few words to give you time to process and respond.  Don't let others automatically problem-solve. (For example: don't let them automatically lay out clothes in the correct position, but hand it to you folded so that you can figure it out for yourself.)  However, if you need help with tasks, they should give you as little as they can so that you can be successful.  If appropriate and safe, they may allow you to make mistakes so that you can figure out how to correct the error.  (For example, they may allow you to put your shoes on the wrong foot to see if you notice that is wrong).  If you struggle, they should give you a cue.  (Example: "Do your shoes feel right?"  "Do they look right?")

## 2021-02-24 NOTE — Progress Notes (Signed)
I agree with the above plan 

## 2021-03-08 ENCOUNTER — Other Ambulatory Visit: Payer: Medicare Other | Admitting: *Deleted

## 2021-03-08 ENCOUNTER — Other Ambulatory Visit: Payer: Self-pay

## 2021-03-08 DIAGNOSIS — E785 Hyperlipidemia, unspecified: Secondary | ICD-10-CM

## 2021-03-08 DIAGNOSIS — I5022 Chronic systolic (congestive) heart failure: Secondary | ICD-10-CM

## 2021-03-08 LAB — BASIC METABOLIC PANEL
BUN/Creatinine Ratio: 10 (ref 10–24)
BUN: 10 mg/dL (ref 8–27)
CO2: 22 mmol/L (ref 20–29)
Calcium: 8.9 mg/dL (ref 8.6–10.2)
Chloride: 104 mmol/L (ref 96–106)
Creatinine, Ser: 0.97 mg/dL (ref 0.76–1.27)
Glucose: 163 mg/dL — ABNORMAL HIGH (ref 65–99)
Potassium: 3.7 mmol/L (ref 3.5–5.2)
Sodium: 138 mmol/L (ref 134–144)
eGFR: 83 mL/min/{1.73_m2} (ref >59)

## 2021-03-20 NOTE — Progress Notes (Signed)
Cardiology Office Note:    Date:  03/21/2021   ID:  Patrick Fritz, DOB 01-18-1951, MRN [id number]  PCP:  Mayra Neer, MD  Cardiologist:  Sinclair Grooms, MD   Referring MD: Mayra Neer, MD   Chief Complaint  Patient presents with  . Coronary Artery Disease  . Congestive Heart Failure    History of Present Illness:    Patrick Fritz is a 70 y.o. 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  Legs feel stronger.  He feels better.  Not lightheaded or dizzy.  Denies orthopnea.  Wonders why I am adding some many medications.  Past Medical History:  Diagnosis Date  . DJD (degenerative joint disease), lumbar   . DVT (deep venous thrombosis) ([place])   . Glaucoma    R eye diminished vision approx 50%  . High cholesterol   . Hyperlipidemia   . Hypertension   . IFG (impaired fasting glucose)   . OSA (obstructive sleep apnea)    Severe w AHI 34/hr on HST no on BiPAP at 19/15cm H2O  . PE (pulmonary embolism)    Second occurance,enknown etiology,lifelong anticoagulation    Past Surgical History:  Procedure Laterality Date  . CARDIAC CATHETERIZATION N/A 05/14/2016   Procedure: Left Heart Cath and Coronary Angiography;  Surgeon: Belva Crome, MD;  Location: [place] [facility];  Service: Cardiovascular;  Laterality: N/A;  . 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 KNEE SURGERY Bilateral    arthroscopic  . TOTAL KNEE ARTHROPLASTY Bilateral 1996     Current Medications: Current Meds  Medication Sig  . allopurinol (ZYLOPRIM) 300 MG tablet Take 300 mg by mouth daily.   . carvedilol (COREG) 3.125 MG tablet Take 1 tablet (3.125 mg total) by mouth 2 (two) times daily.  . dapagliflozin propanediol (FARXIGA) 5 MG TABS tablet Take 1 tablet (5 mg total) by mouth daily before breakfast.  . diclofenac Sodium (VOLTAREN) 1 % GEL Apply 1 application topically daily as needed for pain.  Marland Kitchen gabapentin (NEURONTIN) 100 MG capsule Take 100 mg by mouth 2 (two) times daily.  . rivaroxaban (XARELTO) 20 MG TABS tablet Take 1 tablet (20 mg total) by mouth daily with supper.  . sertraline (ZOLOFT) 50 MG tablet Take 150 mg by mouth daily.  . simvastatin (ZOCOR) 40 MG tablet Take 1 tablet (40 mg total) by mouth at bedtime.  Marland Kitchen spironolactone (ALDACTONE) 25 MG tablet Take 0.5 tablets (12.5 mg total) by mouth daily.  . traMADol (ULTRAM) 50 MG tablet Take 50-100 mg by mouth at bedtime.     Allergies:   Penicillins   Social History   Socioeconomic History  . Marital status: Married    Spouse name: Not on file  . Number of children: Not on file  . Years of education: Not on file  . Highest education level: Not on file  Occupational History  . Not on file  Tobacco Use  . Smoking status: Never Smoker  . Smokeless  tobacco: Never Used  Vaping Use  . Vaping Use: Never used  Substance and Sexual Activity  . Alcohol use: Yes    Comment: 1-2 per week  . Drug use: Not on file  . Sexual activity: Not on file  Other Topics Concern  . Not on file  Social History Narrative  . Not on file   Social Determinants of Health   Financial Resource Strain: Not on file  Food Insecurity: Not on file  Transportation Needs: Not on file  Physical Activity: Not on file  Stress: Not on file  Social Connections: Not on file     Family History: The patient's family history includes CAD in his father; Hypertension in his father; Osteoporosis in his mother.  ROS:    Please see the history of present illness.    Legs do not hurt as much as they did.  Not quite as lightheaded or dizzy when he stands.  All other systems reviewed and are negative.  EKGs/Labs/Other Studies Reviewed:    The following studies were reviewed today:  ECHOCARDIOGRAPHY 01/2021:  IMPRESSIONS  1. Septal and inferior wall hypokinesis . Left ventricular ejection  fraction, by estimation, is 30 to 35%. The left ventricle has moderately  decreased function. The left ventricle demonstrates regional wall motion  abnormalities (see scoring  diagram/findings for description). The left ventricular internal cavity  size was mildly dilated. Left ventricular diastolic parameters were  normal.  2. Right ventricular systolic function is normal. The right ventricular  size is normal. There is normal pulmonary artery systolic pressure.  3. Left atrial size was moderately dilated.  4. The mitral valve is abnormal. Mild mitral valve regurgitation. No  evidence of mitral stenosis.  5. The aortic valve is tricuspid. There is moderate calcification of the  aortic valve. Aortic valve regurgitation is mild. Mild aortic valve  sclerosis is present, with no evidence of aortic valve stenosis.  6. The inferior vena cava is normal in size with greater than 50%  respiratory variability, suggesting right atrial pressure of 3 mmHg.   EKG:  EKG not repeated  Recent Labs: 01/22/2021: ALT 18; Hemoglobin 13.6; Platelets 191; TSH 2.532 03/08/2021: BUN 10; Creatinine, Ser 0.97; Potassium 3.7; Sodium 138  Recent Lipid Panel    Component Value Date/Time   CHOL 184 01/22/2021 0430   CHOL 165 10/21/2020 0932   TRIG 472 (H) 01/22/2021 0430   HDL 34 (L) 01/22/2021 0430   HDL 49 10/21/2020 0932   CHOLHDL 5.4 01/22/2021 0430   VLDL UNABLE TO CALCULATE IF TRIGLYCERIDE OVER 400 mg/dL 01/22/2021 0430   LDLCALC UNABLE TO CALCULATE IF TRIGLYCERIDE OVER 400 mg/dL 01/22/2021 0430   LDLCALC 97 10/21/2020 0932    LDLDIRECT 55.6 01/22/2021 0430    Physical Exam:    VS:  BP 112/60 (BP Location: Left Arm, Patient Position: Sitting, Cuff Size: Normal)   Pulse 74   Ht _0  (1.778 m)   Wt 228 lb (103.4 kg)   SpO2 98%   BMI 32.71 kg/m     Wt Readings from Last 3 Encounters:  03/21/21 228 lb (103.4 kg)  02/22/21 224 lb (101.6 kg)  02/21/21 223 lb 9.6 oz (101.4 kg)    Blood pressure while standing 140/60 mmHg  GEN: Abdominal obesity. No acute distress HEENT: Normal NECK: No JVD. LYMPHATICS: No lymphadenopathy CARDIAC: No murmur. RRR S4 gallop, or edema. VASCULAR:  Normal Pulses. No bruits. RESPIRATORY:  Clear to auscultation without rales, wheezing or rhonchi  ABDOMEN: Soft, non-tender, non-distended,  No pulsatile mass, MUSCULOSKELETAL: No deformity  SKIN: Warm and dry NEUROLOGIC:  Alert and oriented x 3 PSYCHIATRIC:  Normal affect   ASSESSMENT:    1. Chronic systolic heart failure ([place])   2. S/P CABG x 2   3. Bilateral pulmonary embolism ([place])   4. Hyperlipidemia LDL goal <70   5. Chronic anticoagulation   6. Obstructive sleep apnea   7. Morbid obesity ([place])    PLAN:    In order of problems listed above:  1. Everything seems stable.  Will increase Farxiga to 10 mg/day.  We will add 12.5 mg of losartan per day.  We will check be met in 2 weeks.  Have the patient to measure blood pressures.  Clinical follow-up in 2 to 3 months. 2. Not having angina 3. Continue anticoagulation 4. Continue statin therapy 5. Monitor for bleeding 6. Compliance with CPAP.   Medication Adjustments/Labs and Tests Ordered: Current medicines are reviewed at length with the patient today.  Concerns regarding medicines are outlined above.  No orders of the defined types were placed in this encounter.  No orders of the defined types were placed in this encounter.   There are no Patient Instructions on file for this visit.   Signed, Sinclair Grooms, MD  03/21/2021 1:57 PM    [facility]

## 2021-03-21 ENCOUNTER — Other Ambulatory Visit: Payer: Self-pay

## 2021-03-21 ENCOUNTER — Ambulatory Visit: Payer: Medicare Other | Admitting: Interventional Cardiology

## 2021-03-21 ENCOUNTER — Encounter: Payer: Self-pay | Admitting: Interventional Cardiology

## 2021-03-21 VITALS — BP 112/60 | HR 74 | Ht 70.0 in | Wt 228.0 lb

## 2021-03-21 DIAGNOSIS — Z7901 Long term (current) use of anticoagulants: Secondary | ICD-10-CM | POA: Diagnosis not present

## 2021-03-21 DIAGNOSIS — I2699 Other pulmonary embolism without acute cor pulmonale: Secondary | ICD-10-CM | POA: Diagnosis not present

## 2021-03-21 DIAGNOSIS — Z951 Presence of aortocoronary bypass graft: Secondary | ICD-10-CM | POA: Diagnosis not present

## 2021-03-21 DIAGNOSIS — I5022 Chronic systolic (congestive) heart failure: Secondary | ICD-10-CM | POA: Diagnosis not present

## 2021-03-21 DIAGNOSIS — G4733 Obstructive sleep apnea (adult) (pediatric): Secondary | ICD-10-CM | POA: Diagnosis not present

## 2021-03-21 DIAGNOSIS — E785 Hyperlipidemia, unspecified: Secondary | ICD-10-CM

## 2021-03-21 MED ORDER — LOSARTAN POTASSIUM 25 MG PO TABS: 25.0000 mg | ORAL_TABLET | Freq: Every day | ORAL | 3 refills | Status: DC

## 2021-03-21 MED ORDER — DAPAGLIFLOZIN PROPANEDIOL 10 MG PO TABS: 10.0000 mg | ORAL_TABLET | Freq: Every day | ORAL | 3 refills | Status: DC

## 2021-03-21 NOTE — Patient Instructions (Signed)
Medication Instructions:  1) INCREASE Farxiga to 10mg  once daily 2) START Losartan 25mg  once daily  *If you need a refill on your cardiac medications before your next appointment, please call your pharmacy*   Lab Work: BMET in 2 weeks  If you have labs (blood work) drawn today and your tests are completely normal, you will receive your results only by: Marland Kitchen MyChart Message (if you have MyChart) OR . A paper copy in the mail If you have any lab test that is abnormal or we need to change your treatment, we will call you to review the results.   Testing/Procedures: None   Follow-Up: At [facility], you and your health needs are our priority.  As part of our continuing mission to provide you with exceptional heart care, we have created designated Provider Care Teams.  These Care Teams include your primary Cardiologist (physician) and Advanced Practice Providers (APPs -  Physician Assistants and Nurse Practitioners) who all work together to provide you with the care you need, when you need it.  We recommend signing up for the patient portal called "MyChart".  Sign up information is provided on this After Visit Summary.  MyChart is used to connect with patients for Virtual Visits (Telemedicine).  Patients are able to view lab/test results, encounter notes, upcoming appointments, etc.  Non-urgent messages can be sent to your provider as well.   To learn more about what you can do with MyChart, go to [email or web address].    Your next appointment:   3 months  The format for your next appointment:   In Person  Provider:   You may see Sinclair Grooms, MD or one of the following Advanced Practice Providers on your designated Care Team:    Kathyrn Drown, NP    Other Instructions

## 2021-03-21 NOTE — Addendum Note (Signed)
Addended by: Loren Racer on: 03/21/2021 02:01 PM   Modules accepted: Orders

## 2021-04-07 ENCOUNTER — Other Ambulatory Visit: Payer: Medicare Other

## 2021-04-07 ENCOUNTER — Other Ambulatory Visit: Payer: Self-pay

## 2021-04-07 DIAGNOSIS — I5022 Chronic systolic (congestive) heart failure: Secondary | ICD-10-CM

## 2021-04-07 LAB — BASIC METABOLIC PANEL
BUN/Creatinine Ratio: 14 (ref 10–24)
BUN: 13 mg/dL (ref 8–27)
CO2: 21 mmol/L (ref 20–29)
Calcium: 9 mg/dL (ref 8.6–10.2)
Chloride: 104 mmol/L (ref 96–106)
Creatinine, Ser: 0.9 mg/dL (ref 0.76–1.27)
Glucose: 97 mg/dL (ref 65–99)
Potassium: 4.2 mmol/L (ref 3.5–5.2)
Sodium: 139 mmol/L (ref 134–144)
eGFR: 91 mL/min/{1.73_m2} (ref >59)

## 2021-04-29 ENCOUNTER — Encounter: Payer: Self-pay | Admitting: *Deleted

## 2021-05-09 ENCOUNTER — Telehealth: Payer: Self-pay | Admitting: Interventional Cardiology

## 2021-05-09 NOTE — Telephone Encounter (Signed)
Patrick Crome, MD  Loren Racer, RN; Mayra Neer, MD Let the patient know the labs are stable.  How does he feel?  Has he been recording any blood pressures?  If no vital signs, ask him to check his blood pressure for 2 or 3 days and give [place] a report next week.  Blood pressure should be measured at least 2 hours after he has his medications in the morning.  If blood pressures are good, we may increase losartan to 25 mg/day.  A copy will be sent to Mayra Neer, MD    Spoke with pt and he states that he is feeling pretty good.  Was outside yesterday picking green beans and had no issues. Last night he did develop swelling in one leg and it was painful.  Swelling no better today but pain improved.  No swelling in other leg.  Does not check BP regularly.  Advised to monitor BP for a few days and call back Friday or Monday with those readings.  Will send to Dr. Tamala Julian for review since pt does have swelling in one leg. Pt currently on Xarelto.

## 2021-05-09 NOTE — Telephone Encounter (Signed)
If swelling is similar to what he has from time to time, no change is needed.

## 2021-05-09 NOTE — Telephone Encounter (Signed)
Pt is calling in regards to getting the results from Lab work. Please advise pt further

## 2021-05-10 DIAGNOSIS — M25561 Pain in right knee: Secondary | ICD-10-CM | POA: Diagnosis not present

## 2021-05-10 DIAGNOSIS — Z96651 Presence of right artificial knee joint: Secondary | ICD-10-CM | POA: Diagnosis not present

## 2021-05-13 ENCOUNTER — Emergency Department (HOSPITAL_BASED_OUTPATIENT_CLINIC_OR_DEPARTMENT_OTHER): Payer: Medicare Other

## 2021-05-13 ENCOUNTER — Emergency Department (HOSPITAL_BASED_OUTPATIENT_CLINIC_OR_DEPARTMENT_OTHER)
Admission: EM | Admit: 2021-05-13 | Discharge: 2021-05-13 | Disposition: A | Discharge status: Home / Self Care | Payer: Medicare Other | Attending: Emergency Medicine | Admitting: Emergency Medicine

## 2021-05-13 ENCOUNTER — Other Ambulatory Visit: Payer: Self-pay

## 2021-05-13 DIAGNOSIS — M25461 Effusion, right knee: Secondary | ICD-10-CM | POA: Diagnosis not present

## 2021-05-13 DIAGNOSIS — W1830XA Fall on same level, unspecified, initial encounter: Secondary | ICD-10-CM | POA: Diagnosis not present

## 2021-05-13 DIAGNOSIS — Z96653 Presence of artificial knee joint, bilateral: Secondary | ICD-10-CM | POA: Insufficient documentation

## 2021-05-13 DIAGNOSIS — M7121 Synovial cyst of popliteal space [Baker], right knee: Secondary | ICD-10-CM

## 2021-05-13 DIAGNOSIS — Z7901 Long term (current) use of anticoagulants: Secondary | ICD-10-CM | POA: Diagnosis not present

## 2021-05-13 DIAGNOSIS — I10 Essential (primary) hypertension: Secondary | ICD-10-CM | POA: Diagnosis not present

## 2021-05-13 DIAGNOSIS — I2510 Atherosclerotic heart disease of native coronary artery without angina pectoris: Secondary | ICD-10-CM | POA: Insufficient documentation

## 2021-05-13 DIAGNOSIS — M7989 Other specified soft tissue disorders: Secondary | ICD-10-CM | POA: Diagnosis not present

## 2021-05-13 DIAGNOSIS — Z951 Presence of aortocoronary bypass graft: Secondary | ICD-10-CM | POA: Diagnosis not present

## 2021-05-13 DIAGNOSIS — Z79899 Other long term (current) drug therapy: Secondary | ICD-10-CM | POA: Diagnosis not present

## 2021-05-13 DIAGNOSIS — R609 Edema, unspecified: Secondary | ICD-10-CM | POA: Diagnosis present

## 2021-05-13 DIAGNOSIS — M79661 Pain in right lower leg: Secondary | ICD-10-CM | POA: Diagnosis not present

## 2021-05-13 DIAGNOSIS — M79604 Pain in right leg: Secondary | ICD-10-CM | POA: Diagnosis not present

## 2021-05-13 NOTE — ED Provider Notes (Signed)
[facility] DEPT Provider Note   CSN: [id number] Arrival date & time: 05/13/21  1834     History Chief Complaint  Patient presents with   Knee Pain   Leg Pain    Patrick Fritz is a 70 y.o. male.  HPI  70 year old male with past medical history of DVT anticoagulated on Xarelto, HTN, HLD presents to the emergency department with right knee swelling.  Patient states over the weekend he was on his feet all day Saturday and Sunday.  For the past week he has had right knee and calf swelling that has been painful.  He is concerned about a blood clot.  States has been compliant with his Xarelto.  No chest pain or shortness of breath.  Denies any fever.  He has not been resting or elevating the leg.  No other acute injury.  Past Medical History:  Diagnosis Date   DJD (degenerative joint disease), lumbar    DVT (deep venous thrombosis) (HCC)    Glaucoma    R eye diminished vision approx 50%   High cholesterol    Hyperlipidemia    Hypertension    IFG (impaired fasting glucose)    OSA (obstructive sleep apnea)    Severe w AHI 34/hr on HST no on BiPAP at 19/15cm H2O   PE (pulmonary embolism)    Second occurance,enknown etiology,lifelong anticoagulation    Patient Active Problem List   Diagnosis Date Noted   TIA (transient ischemic attack) 01/22/2021   Gout 01/22/2021   Glaucoma    Hyperlipidemia    CAD (coronary artery disease)    Aortic atherosclerosis (HCC)    History of DVT in adulthood    Hypertension    Bilateral pulmonary embolism ([place]) 07/12/2016   S/P CABG x 2 05/22/2016   Hx of ascending aorta replacement 05/22/2016   Chest pain    History of ST elevation myocardial infarction (STEMI)    Obstructive sleep apnea 12/16/2013   Morbid obesity ([place]) 12/16/2013   SOB (shortness of breath) 12/16/2013    Past Surgical History:  Procedure Laterality Date   CARDIAC CATHETERIZATION N/A 05/14/2016   Procedure: Left Heart Cath and Coronary  Angiography;  Surgeon: Belva Crome, MD;  Location: [facility];  Service: Cardiovascular;  Laterality: N/A;   CORONARY ARTERY BYPASS GRAFT N/A 05/14/2016   Procedure: CORONARY ARTERY BYPASS GRAFTING (CABG) times two with LIMA to LAD and right leg SVG to PDA,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;  Surgeon: Ivin Poot, MD;  Location: [facility];  Service: Open Heart Surgery;  Laterality: N/A;   KNEE SURGERY Bilateral    arthroscopic   TOTAL KNEE ARTHROPLASTY Bilateral 1996       Family History  Problem Relation Age of Onset   Osteoporosis Mother    Hypertension Father    CAD Father     Social History   Tobacco Use   Smoking status: Never   Smokeless tobacco: Never  Vaping Use   Vaping Use: Never used  Substance Use Topics   Alcohol use: Yes    Comment: 1-2 per week    Home Medications Prior to Admission medications   Medication Sig Start Date End Date Taking? Authorizing Provider  allopurinol (ZYLOPRIM) 300 MG tablet Take 300 mg by mouth daily.     [provider]  carvedilol (COREG) 3.125 MG tablet Take 1 tablet (3.125 mg total) by mouth 2 (two) times daily. 01/27/21   Belva Crome, MD  dapagliflozin  propanediol (FARXIGA) 10 MG TABS tablet Take 1 tablet (10 mg total) by mouth daily before breakfast. 03/21/21   Belva Crome, MD  diclofenac Sodium (VOLTAREN) 1 % GEL Apply 1 application topically daily as needed for pain.    [provider]  gabapentin (NEURONTIN) 100 MG capsule Take 100 mg by mouth 2 (two) times daily.    [provider]  losartan (COZAAR) 25 MG tablet Take 1 tablet (25 mg total) by mouth daily. 03/21/21   Belva Crome, MD  rivaroxaban (XARELTO) 20 MG TABS tablet Take 1 tablet (20 mg total) by mouth daily with supper. 06/09/16   Isaiah Serge, NP  sertraline (ZOLOFT) 50 MG tablet Take 150 mg by mouth daily. 06/20/16   [provider]  simvastatin (ZOCOR) 40 MG tablet Take 1 tablet (40 mg total) by mouth at  bedtime. 08/20/20   Belva Crome, MD  spironolactone (ALDACTONE) 25 MG tablet Take 0.5 tablets (12.5 mg total) by mouth daily. 01/27/21   Belva Crome, MD  traMADol (ULTRAM) 50 MG tablet Take 50-100 mg by mouth at bedtime. 06/23/16   [provider]    Allergies    Penicillins  Review of Systems   Review of Systems  Constitutional:  Negative for fever.  Respiratory:  Negative for shortness of breath.   Cardiovascular:  Negative for chest pain.  Gastrointestinal:  Negative for abdominal pain.  Musculoskeletal:        Right knee/calf pain/swelling  Skin:  Negative for rash.  Neurological:  Negative for weakness, numbness and headaches.   Physical Exam Updated Vital Signs BP 133/77 (BP Location: Left Arm)   Pulse 68   Temp 98 F (36.7 C) (Oral)   Resp 18   Ht '5\' 10"'$  (1.778 m)   Wt 100.7 kg   SpO2 100%   BMI 31.85 kg/m   Physical Exam Vitals and nursing note reviewed.  Constitutional:      Appearance: Normal appearance.  HENT:     Head: Normocephalic.     Mouth/Throat:     Mouth: Mucous membranes are moist.  Cardiovascular:     Rate and Rhythm: Normal rate.  Pulmonary:     Effort: Pulmonary effort is normal. No respiratory distress.  Musculoskeletal:     Comments: Right knee and right calf are edematous, no significant tenderness to palpation, no pain with passive range of the knee, no overlying redness/cellulitis, no significant warmth, peripheral pulses/perfusion intact  Skin:    General: Skin is warm.  Neurological:     Mental Status: He is alert and oriented to person, place, and time. Mental status is at baseline.  Psychiatric:        Mood and Affect: Mood normal.    ED Results / Procedures / Treatments   Labs (all labs ordered are listed, but only abnormal results are displayed) Labs Reviewed - No data to display  EKG None  Radiology US Venous Img Lower Unilateral Right  Result Date: 05/13/2021 CLINICAL DATA:  Right leg pain, redness,  swelling and tenderness for 5 days. No known injury. EXAM: RIGHT LOWER EXTREMITY VENOUS DOPPLER ULTRASOUND TECHNIQUE: Gray-scale sonography with compression, as well as color and duplex ultrasound, were performed to evaluate the deep venous system(s) from the level of the common femoral vein through the popliteal and proximal calf veins. COMPARISON:  Plain films right knee today. FINDINGS: VENOUS Normal compressibility of the common femoral, superficial femoral, and popliteal veins, as well as the visualized calf veins. Visualized  portions of profunda femoral vein and great saphenous vein unremarkable. No filling defects to suggest DVT on grayscale or color Doppler imaging. Doppler waveforms show normal direction of venous flow, normal respiratory plasticity and response to augmentation. Limited views of the contralateral common femoral vein are unremarkable. OTHER Superior to the patella, there is debris containing fluid likely due to a joint effusion. Also seen is a fluid collection in the popliteal fossa measuring approximately 5.5 x 1.9 x 1.8 cm. Calcification within this collection correlates with the loose body seen on the prior plain films. Limitations: None. IMPRESSION: Negative for DVT. Fluid collection superior to the patella is likely a debris containing joint effusion. This is at the patient's reported region of pain. Baker's cyst with a loose body within it as seen on plain films today. Electronically Signed   By: Inge Rise M.D.   On: 05/13/2021 21:14   DG Knee Complete 4 Views Right  Result Date: 05/13/2021 CLINICAL DATA:  Right knee pain radiating into the leg for 1 week redness and swelling. EXAM: RIGHT KNEE - COMPLETE 4+ VIEW COMPARISON:  None. FINDINGS: Total knee arthroplasty is in place. No hardware complication or acute bony abnormality is identified. Moderate to moderately large knee joint effusion is seen. Calcification in the soft tissues posterior to the knee measuring 2.9 cm  craniocaudal by 1.4 cm AP by 1.8 cm transverse is consistent with a loose body in a Baker's cyst. IMPRESSION: Knee joint effusion. Negative for acute bony or joint abnormality. Loose body in a Baker's cyst. Electronically Signed   By: Inge Rise M.D.   On: 05/13/2021 20:58    Procedures Procedures   Medications Ordered in ED Medications - No data to display  ED Course  I have reviewed the triage vital signs and the nursing notes.  Pertinent labs & imaging results that were available during my care of the patient were reviewed by me and considered in my medical decision making (see chart for details).    MDM Rules/Calculators/A&P                           70 year old male presents emergency department with right knee and calf pain/swelling.  Vitals are normal and stable.  History of blood clot but he is compliant with Xarelto.  X-ray shows joint effusion.  Ultrasound shows no DVT but does identify Baker's cyst with a joint effusion.  Low suspicion for septic arthritis given that he is afebrile, no pain with passive range of motion.  I believe the Baker's cyst is the source of his symptoms.  Advised RICE therapy.  He will follow-up with orthopedics for further evaluation and treatment.  Patient at this time appears safe and stable for discharge and will be treated as an outpatient.  Discharge plan and strict return to ED precautions discussed, patient verbalizes understanding and agreement.  Final Clinical Impression(s) / ED Diagnoses Final diagnoses:  Synovial cyst of right popliteal space    Rx / DC Orders ED Discharge Orders     None        Lorelle Gibbs, DO 05/13/21 2256

## 2021-05-13 NOTE — Discharge Instructions (Addendum)
You have been seen and discharged from the emergency department.  Your ultrasound was negative for DVT.  It did show a Baker's cyst which is the cause of the knee and leg swelling.  Rest the knee, elevate the leg, ice the knee.  Take Tylenol as needed for pain control.  Follow-up with orthopedics for further care.  They may inject and drain the knee if needed. Take home medications as prescribed. If you have any worsening symptoms or further concerns for your health please return to an emergency department for further evaluation.

## 2021-05-13 NOTE — ED Triage Notes (Addendum)
Pt is present for right knee pain that radiates down to his right leg x one week. Pt was cooking and had some outdoor events on Saturday last week and since then his knee has progressively swelled and now his leg is warm and red. Pt states "I am worried about a blood clot". Pt has had a difficult time bearing any weight. Hx of knee surgery. Pt is on Xarelto.

## 2021-05-19 DIAGNOSIS — Z96651 Presence of right artificial knee joint: Secondary | ICD-10-CM | POA: Diagnosis not present

## 2021-05-19 DIAGNOSIS — M25561 Pain in right knee: Secondary | ICD-10-CM | POA: Diagnosis not present

## 2021-05-19 NOTE — Telephone Encounter (Signed)
Spoke with pt and he actually went to ED 7/29 because pain was so bad to his right leg.  Pt noted to have a baker's cyst in right knee.  Swelling is better today and he has an appt with Ortho today about next steps to take care of this.    Inquired about BPs and he said they have been good.  Last 2-3 checks were 118/68.  Has had no SBPs in the 130s.  Advised to continue with current medications and I will call back if Dr. Tamala Julian has any new recommendations, otherwise we will see him next month as planned.  Pt appreciative for call.

## 2021-05-24 ENCOUNTER — Encounter: Payer: Self-pay | Admitting: Orthopedic Surgery

## 2021-05-24 ENCOUNTER — Inpatient Hospital Stay (HOSPITAL_COMMUNITY)
Admission: AD | Admit: 2021-05-24 | Discharge: 2021-05-29 | DRG: 467 | Disposition: A | Discharge status: Home Health | Payer: Medicare Other | Source: Ambulatory Visit | Attending: Internal Medicine | Admitting: Internal Medicine

## 2021-05-24 DIAGNOSIS — F419 Anxiety disorder, unspecified: Secondary | ICD-10-CM | POA: Diagnosis present

## 2021-05-24 DIAGNOSIS — I2782 Chronic pulmonary embolism: Secondary | ICD-10-CM | POA: Diagnosis not present

## 2021-05-24 DIAGNOSIS — I2510 Atherosclerotic heart disease of native coronary artery without angina pectoris: Secondary | ICD-10-CM | POA: Diagnosis present

## 2021-05-24 DIAGNOSIS — G8929 Other chronic pain: Secondary | ICD-10-CM | POA: Diagnosis present

## 2021-05-24 DIAGNOSIS — M47816 Spondylosis without myelopathy or radiculopathy, lumbar region: Secondary | ICD-10-CM | POA: Diagnosis not present

## 2021-05-24 DIAGNOSIS — F4310 Post-traumatic stress disorder, unspecified: Secondary | ICD-10-CM | POA: Diagnosis present

## 2021-05-24 DIAGNOSIS — I5022 Chronic systolic (congestive) heart failure: Secondary | ICD-10-CM | POA: Diagnosis not present

## 2021-05-24 DIAGNOSIS — Z86718 Personal history of other venous thrombosis and embolism: Secondary | ICD-10-CM | POA: Diagnosis not present

## 2021-05-24 DIAGNOSIS — A408 Other streptococcal sepsis: Secondary | ICD-10-CM | POA: Diagnosis present

## 2021-05-24 DIAGNOSIS — E785 Hyperlipidemia, unspecified: Secondary | ICD-10-CM | POA: Diagnosis not present

## 2021-05-24 DIAGNOSIS — T8453XA Infection and inflammatory reaction due to internal right knee prosthesis, initial encounter: Principal | ICD-10-CM | POA: Diagnosis present

## 2021-05-24 DIAGNOSIS — Z79899 Other long term (current) drug therapy: Secondary | ICD-10-CM

## 2021-05-24 DIAGNOSIS — D649 Anemia, unspecified: Secondary | ICD-10-CM | POA: Diagnosis not present

## 2021-05-24 DIAGNOSIS — T8459XD Infection and inflammatory reaction due to other internal joint prosthesis, subsequent encounter: Secondary | ICD-10-CM | POA: Diagnosis not present

## 2021-05-24 DIAGNOSIS — Z6830 Body mass index (BMI) 30.0-30.9, adult: Secondary | ICD-10-CM | POA: Diagnosis not present

## 2021-05-24 DIAGNOSIS — Y831 Surgical operation with implant of artificial internal device as the cause of abnormal reaction of the patient, or of later complication, without mention of misadventure at the time of the procedure: Secondary | ICD-10-CM | POA: Diagnosis present

## 2021-05-24 DIAGNOSIS — E669 Obesity, unspecified: Secondary | ICD-10-CM | POA: Diagnosis present

## 2021-05-24 DIAGNOSIS — Z8249 Family history of ischemic heart disease and other diseases of the circulatory system: Secondary | ICD-10-CM | POA: Diagnosis not present

## 2021-05-24 DIAGNOSIS — T8459XA Infection and inflammatory reaction due to other internal joint prosthesis, initial encounter: Secondary | ICD-10-CM

## 2021-05-24 DIAGNOSIS — M00261 Other streptococcal arthritis, right knee: Secondary | ICD-10-CM | POA: Diagnosis not present

## 2021-05-24 DIAGNOSIS — Z880 Allergy status to penicillin: Secondary | ICD-10-CM

## 2021-05-24 DIAGNOSIS — E7800 Pure hypercholesterolemia, unspecified: Secondary | ICD-10-CM | POA: Diagnosis not present

## 2021-05-24 DIAGNOSIS — M009 Pyogenic arthritis, unspecified: Secondary | ICD-10-CM | POA: Diagnosis present

## 2021-05-24 DIAGNOSIS — Z7901 Long term (current) use of anticoagulants: Secondary | ICD-10-CM | POA: Diagnosis not present

## 2021-05-24 DIAGNOSIS — Z20822 Contact with and (suspected) exposure to covid-19: Secondary | ICD-10-CM | POA: Diagnosis not present

## 2021-05-24 DIAGNOSIS — Z951 Presence of aortocoronary bypass graft: Secondary | ICD-10-CM | POA: Diagnosis not present

## 2021-05-24 DIAGNOSIS — I252 Old myocardial infarction: Secondary | ICD-10-CM | POA: Diagnosis not present

## 2021-05-24 DIAGNOSIS — I10 Essential (primary) hypertension: Secondary | ICD-10-CM | POA: Diagnosis not present

## 2021-05-24 DIAGNOSIS — G4733 Obstructive sleep apnea (adult) (pediatric): Secondary | ICD-10-CM | POA: Diagnosis present

## 2021-05-24 DIAGNOSIS — Z96659 Presence of unspecified artificial knee joint: Secondary | ICD-10-CM

## 2021-05-24 DIAGNOSIS — Z8262 Family history of osteoporosis: Secondary | ICD-10-CM

## 2021-05-24 DIAGNOSIS — M25461 Effusion, right knee: Secondary | ICD-10-CM | POA: Diagnosis not present

## 2021-05-24 DIAGNOSIS — M00061 Staphylococcal arthritis, right knee: Secondary | ICD-10-CM | POA: Diagnosis not present

## 2021-05-24 DIAGNOSIS — Z9889 Other specified postprocedural states: Secondary | ICD-10-CM | POA: Diagnosis not present

## 2021-05-24 DIAGNOSIS — M7121 Synovial cyst of popliteal space [Baker], right knee: Secondary | ICD-10-CM | POA: Diagnosis present

## 2021-05-24 DIAGNOSIS — Z471 Aftercare following joint replacement surgery: Secondary | ICD-10-CM | POA: Diagnosis not present

## 2021-05-24 DIAGNOSIS — M25561 Pain in right knee: Secondary | ICD-10-CM | POA: Diagnosis present

## 2021-05-24 DIAGNOSIS — R748 Abnormal levels of other serum enzymes: Secondary | ICD-10-CM | POA: Diagnosis not present

## 2021-05-24 DIAGNOSIS — H409 Unspecified glaucoma: Secondary | ICD-10-CM | POA: Diagnosis present

## 2021-05-24 DIAGNOSIS — I110 Hypertensive heart disease with heart failure: Secondary | ICD-10-CM | POA: Diagnosis not present

## 2021-05-24 DIAGNOSIS — G8918 Other acute postprocedural pain: Secondary | ICD-10-CM | POA: Diagnosis not present

## 2021-05-24 DIAGNOSIS — B954 Other streptococcus as the cause of diseases classified elsewhere: Secondary | ICD-10-CM | POA: Diagnosis present

## 2021-05-24 DIAGNOSIS — Z96651 Presence of right artificial knee joint: Secondary | ICD-10-CM | POA: Diagnosis not present

## 2021-05-24 DIAGNOSIS — Z96652 Presence of left artificial knee joint: Secondary | ICD-10-CM | POA: Diagnosis present

## 2021-05-24 HISTORY — DX: Family history of other specified conditions: Z84.89

## 2021-05-24 LAB — COMPREHENSIVE METABOLIC PANEL
ALT: 42 U/L (ref 0–44)
AST: 41 U/L (ref 15–41)
Albumin: 2.7 g/dL — ABNORMAL LOW (ref 3.5–5.0)
Alkaline Phosphatase: 422 U/L — ABNORMAL HIGH (ref 38–126)
Anion gap: 13 (ref 5–15)
BUN: 16 mg/dL (ref 8–23)
CO2: 26 mmol/L (ref 22–32)
Calcium: 9.1 mg/dL (ref 8.9–10.3)
Chloride: 99 mmol/L (ref 98–111)
Creatinine, Ser: 1 mg/dL (ref 0.61–1.24)
GFR, Estimated: >60 mL/min (ref >60)
Glucose, Bld: 102 mg/dL — ABNORMAL HIGH (ref 70–99)
Potassium: 4.4 mmol/L (ref 3.5–5.1)
Sodium: 138 mmol/L (ref 135–145)
Total Bilirubin: 0.6 mg/dL (ref 0.3–1.2)
Total Protein: 7.7 g/dL (ref 6.5–8.1)

## 2021-05-24 LAB — CBC WITH DIFFERENTIAL/PLATELET
Abs Immature Granulocytes: 0.09 10*3/uL — ABNORMAL HIGH (ref 0.00–0.07)
Basophils Absolute: 0 10*3/uL (ref 0.0–0.1)
Basophils Relative: 0 %
Eosinophils Absolute: 0 10*3/uL (ref 0.0–0.5)
Eosinophils Relative: 0 %
HCT: 35.8 % — ABNORMAL LOW (ref 39.0–52.0)
Hemoglobin: 11.3 g/dL — ABNORMAL LOW (ref 13.0–17.0)
Immature Granulocytes: 1 %
Lymphocytes Relative: 10 %
Lymphs Abs: 1 10*3/uL (ref 0.7–4.0)
MCH: 29.4 pg (ref 26.0–34.0)
MCHC: 31.6 g/dL (ref 30.0–36.0)
MCV: 93 fL (ref 80.0–100.0)
Monocytes Absolute: 1.1 10*3/uL — ABNORMAL HIGH (ref 0.1–1.0)
Monocytes Relative: 10 %
Neutro Abs: 8.2 10*3/uL — ABNORMAL HIGH (ref 1.7–7.7)
Neutrophils Relative %: 79 %
Platelets: 579 10*3/uL — ABNORMAL HIGH (ref 150–400)
RBC: 3.85 MIL/uL — ABNORMAL LOW (ref 4.22–5.81)
RDW: 13.6 % (ref 11.5–15.5)
WBC: 10.4 10*3/uL (ref 4.0–10.5)
nRBC: 0 % (ref 0.0–0.2)

## 2021-05-24 LAB — RESP PANEL BY RT-PCR (FLU A&B, COVID) ARPGX2
Influenza A by PCR: NEGATIVE
Influenza B by PCR: NEGATIVE
SARS Coronavirus 2 by RT PCR: NEGATIVE

## 2021-05-24 LAB — MAGNESIUM: Magnesium: 2 mg/dL (ref 1.7–2.4)

## 2021-05-24 LAB — SEDIMENTATION RATE: Sed Rate: 139 mm/hr — ABNORMAL HIGH (ref 0–16)

## 2021-05-24 LAB — GAMMA GT: GGT: 215 U/L — ABNORMAL HIGH (ref 7–50)

## 2021-05-24 LAB — PROTIME-INR
INR: 1.4 — ABNORMAL HIGH (ref 0.8–1.2)
Prothrombin Time: 16.8 seconds — ABNORMAL HIGH (ref 11.4–15.2)

## 2021-05-24 LAB — APTT: aPTT: 42 seconds — ABNORMAL HIGH (ref 24–36)

## 2021-05-24 LAB — LACTIC ACID, PLASMA
Lactic Acid, Venous: 1.3 mmol/L (ref 0.5–1.9)
Lactic Acid, Venous: 1.6 mmol/L (ref 0.5–1.9)

## 2021-05-24 LAB — HEPARIN LEVEL (UNFRACTIONATED): Heparin Unfractionated: >1.1 IU/mL — ABNORMAL HIGH (ref 0.30–0.70)

## 2021-05-24 LAB — C-REACTIVE PROTEIN: CRP: 32.2 mg/dL — ABNORMAL HIGH (ref <1.0)

## 2021-05-24 MED ORDER — ACETAMINOPHEN 325 MG PO TABS
650.0000 mg | ORAL_TABLET | Freq: Four times a day (QID) | ORAL | Status: DC | PRN
  Administered 2021-05-24: 650 mg via ORAL
  Filled 2021-05-24: qty 2

## 2021-05-24 MED ORDER — DAPAGLIFLOZIN PROPANEDIOL 10 MG PO TABS
10.0000 mg | ORAL_TABLET | Freq: Every day | ORAL | Status: DC
  Administered 2021-05-25 – 2021-05-29 (×5): 10 mg via ORAL
  Filled 2021-05-24 (×5): qty 1

## 2021-05-24 MED ORDER — GABAPENTIN 100 MG PO CAPS
100.0000 mg | ORAL_CAPSULE | Freq: Two times a day (BID) | ORAL | Status: DC
  Administered 2021-05-24 – 2021-05-29 (×9): 100 mg via ORAL
  Filled 2021-05-24 (×10): qty 1

## 2021-05-24 MED ORDER — SIMVASTATIN 40 MG PO TABS
40.0000 mg | ORAL_TABLET | Freq: Every day | ORAL | Status: DC
  Administered 2021-05-24 – 2021-05-28 (×5): 40 mg via ORAL
  Filled 2021-05-24 (×5): qty 1

## 2021-05-24 MED ORDER — LOSARTAN POTASSIUM 25 MG PO TABS
25.0000 mg | ORAL_TABLET | Freq: Every day | ORAL | Status: DC
  Administered 2021-05-25 – 2021-05-29 (×4): 25 mg via ORAL
  Filled 2021-05-24 (×5): qty 1

## 2021-05-24 MED ORDER — SERTRALINE HCL 50 MG PO TABS
150.0000 mg | ORAL_TABLET | Freq: Every day | ORAL | Status: DC
  Administered 2021-05-25 – 2021-05-29 (×4): 150 mg via ORAL
  Filled 2021-05-24 (×4): qty 1

## 2021-05-24 MED ORDER — DOCUSATE SODIUM 100 MG PO CAPS
100.0000 mg | ORAL_CAPSULE | Freq: Two times a day (BID) | ORAL | Status: DC
  Administered 2021-05-24 – 2021-05-25 (×2): 100 mg via ORAL
  Filled 2021-05-24 (×4): qty 1

## 2021-05-24 MED ORDER — ACETAMINOPHEN 650 MG RE SUPP: 650.0000 mg | Freq: Four times a day (QID) | RECTAL | Status: DC | PRN

## 2021-05-24 MED ORDER — ALLOPURINOL 300 MG PO TABS
300.0000 mg | ORAL_TABLET | Freq: Every day | ORAL | Status: DC
  Administered 2021-05-25 – 2021-05-29 (×4): 300 mg via ORAL
  Filled 2021-05-24 (×4): qty 1

## 2021-05-24 MED ORDER — ONDANSETRON HCL 4 MG/2ML IJ SOLN: 4.0000 mg | Freq: Four times a day (QID) | INTRAMUSCULAR | Status: DC | PRN

## 2021-05-24 MED ORDER — CARVEDILOL 3.125 MG PO TABS
3.1250 mg | ORAL_TABLET | Freq: Two times a day (BID) | ORAL | Status: DC
  Administered 2021-05-24 – 2021-05-25 (×3): 3.125 mg via ORAL
  Filled 2021-05-24 (×4): qty 1

## 2021-05-24 MED ORDER — HEPARIN (PORCINE) 25000 UT/250ML-% IV SOLN
1300.0000 [IU]/h | INTRAVENOUS | Status: DC
  Administered 2021-05-24: 1300 [IU]/h via INTRAVENOUS
  Filled 2021-05-24: qty 250

## 2021-05-24 MED ORDER — SENNOSIDES-DOCUSATE SODIUM 8.6-50 MG PO TABS: 1.0000 | ORAL_TABLET | Freq: Every evening | ORAL | Status: DC | PRN

## 2021-05-24 MED ORDER — ONDANSETRON HCL 4 MG PO TABS: 4.0000 mg | ORAL_TABLET | Freq: Four times a day (QID) | ORAL | Status: DC | PRN

## 2021-05-24 MED ORDER — VANCOMYCIN HCL 1500 MG/300ML IV SOLN
1500.0000 mg | Freq: Once | INTRAVENOUS | Status: AC
  Administered 2021-05-24: 1500 mg via INTRAVENOUS
  Filled 2021-05-24: qty 300

## 2021-05-24 MED ORDER — SPIRONOLACTONE 12.5 MG HALF TABLET
12.5000 mg | ORAL_TABLET | Freq: Every day | ORAL | Status: DC
  Administered 2021-05-25: 12.5 mg via ORAL
  Filled 2021-05-24: qty 1

## 2021-05-24 MED ORDER — FENTANYL CITRATE (PF) 100 MCG/2ML IJ SOLN: 12.5000 ug | INTRAMUSCULAR | Status: DC | PRN

## 2021-05-24 MED ORDER — OXYCODONE HCL 5 MG PO TABS
5.0000 mg | ORAL_TABLET | ORAL | Status: DC | PRN
  Administered 2021-05-24 – 2021-05-25 (×2): 5 mg via ORAL
  Filled 2021-05-24 (×3): qty 1

## 2021-05-24 MED ORDER — VANCOMYCIN HCL 1750 MG/350ML IV SOLN
1750.0000 mg | Freq: Every day | INTRAVENOUS | Status: DC
  Filled 2021-05-24: qty 350

## 2021-05-24 NOTE — Progress Notes (Signed)
Direct Admit from [facility].  Patient with right knee swelling and probable septic joint.  Had tap recently in office and culture grew- Strep (office to send culture report with patient.) Vital signs stable: temp: 99.1, HR: 76, BP 110/60 Allergy PCN  Will need Blood cultures upon arrival and IV abx along with basic labs. Eulogio Bear DO

## 2021-05-24 NOTE — H&P (Signed)
History and Physical    Patrick Fritz [id number] DOB: Mar 03, 1951 DOA: 05/24/2021  PCP: Mayra Neer, MD Patient coming from: Home  Chief Complaint: "Knee infection"  HPI: Patrick Fritz is a 70 y.o. male with PMH of bilateral TKA, systolic CHF, CAD/STEMI s/p CABG in 2017, PE/DVT on Xarelto, HTN, anxiety, PTSD, osteoarthritis/radiculopathy and OSA not on CPAP presenting with right knee pain and swelling.  Patient reports worsening of chronic right knee pain over the last 2 weeks.  He presented to ED on 7/29 and had an x-ray and venous Doppler that showed knee joint effusion with loose body in Baker's cyst.  He was discharged home to follow-up with orthopedic surgery.  He was seen at [facility] 6 days ago and had arthrocentesis.  He returned to [facility] for follow-up today, and directed to hospital with concern for septic arthritis.  Synovial fluid from [facility] with 79,000 nucleated cells with 93.5% neutrophils and GPC in pairs.   Patient has chills but no fever.  Right knee feels hot at times.  He rates his pain 9/10 when he was at [facility] but improved to 7/10 here without medication.  He also noted swelling and limited range of motion due to pain.  He denies numbness or tingling.  He denies chest pain at rest or with exertion, cough, nausea, vomiting or UTI symptoms other than nocturnal polyuria.  He reports "a little" shortness of breath unchanged from baseline.  Last dose on his Xarelto was the evening of 8/8.  He has bilateral TKA was 26 years ago.  He denies history of diabetes.  Patient stated that his penicillin allergy was when he was very young.  He reports using penicillin prophylactically during dental procedures. Patient lives with his wife.  Denies smoking cigarette.  Admits to social alcohol.  Denies recreational drug use.  Prefers to remain full code.  On arrival, vital signs within normal.  ALP 422.  WBC 10.4.  Hgb 11.3.  Platelet 579.  Otherwise, CBC and CMP  without significant finding.  CRP elevated to 32.2.  Lactic acid 1.3.  INR 1.4.  APTT 42.  COVID-19 and influenza PCR nonreactive.  Blood cultures obtained.  Patient was started on IV vancomycin for septic arthritis of right knee and IV heparin for chronic PE/DVT.  Per patient, Dr. Lyla Glassing from [facility] to do arthroscopic washout of his right knee on 8/11.  ROS All review of system negative except for pertinent positives and negatives as history of present illness above.  PMH Past Medical History:  Diagnosis Date   DJD (degenerative joint disease), lumbar    DVT (deep venous thrombosis) (HCC)    Glaucoma    R eye diminished vision approx 50%   High cholesterol    Hyperlipidemia    Hypertension    IFG (impaired fasting glucose)    OSA (obstructive sleep apnea)    Severe w AHI 34/hr on HST no on BiPAP at 19/15cm H2O   PE (pulmonary embolism)    Second occurance,enknown etiology,lifelong anticoagulation   PSH Past Surgical History:  Procedure Laterality Date   CARDIAC CATHETERIZATION N/A 05/14/2016   Procedure: Left Heart Cath and Coronary Angiography;  Surgeon: Belva Crome, MD;  Location: [place] [facility];  Service: Cardiovascular;  Laterality: N/A;   CORONARY ARTERY BYPASS GRAFT N/A 05/14/2016   Procedure: CORONARY ARTERY BYPASS GRAFTING (CABG) times two with LIMA to LAD and right leg SVG to PDA,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;  Surgeon: Tharon Aquas Trigt,  MD;  Location: [facility];  Service: Open Heart Surgery;  Laterality: N/A;   KNEE SURGERY Bilateral    arthroscopic   TOTAL KNEE ARTHROPLASTY Bilateral 1996   Fam HX Family History  Problem Relation Age of Onset   Osteoporosis Mother    Hypertension Father    CAD Father     Social Hx  reports that he has never smoked. He has never used smokeless tobacco. He reports current alcohol use. No history on file for drug use.  Allergy Allergies  Allergen Reactions   Penicillins Anaphylaxis    Has patient had a  PCN reaction causing immediate rash, facial/tongue/throat swelling, SOB or lightheadedness with hypotension: Yes Has patient had a PCN reaction causing severe rash involving mucus membranes or skin necrosis: No Has patient had a PCN reaction that required hospitalization Yes Has patient had a PCN reaction occurring within the last 10 years: No If all of the above answers are "NO", then may proceed with Cephalosporin use.    Home Meds Prior to Admission medications   Medication Sig Start Date End Date Taking? Authorizing Provider  acetaminophen (TYLENOL) 325 MG tablet Take 650 mg by mouth every 6 (six) hours as needed for moderate pain.   Yes [provider]  allopurinol (ZYLOPRIM) 300 MG tablet Take 300 mg by mouth daily.    Yes [provider]  carvedilol (COREG) 3.125 MG tablet Take 1 tablet (3.125 mg total) by mouth 2 (two) times daily. 01/27/21  Yes Smith, Henry W, MD  dapagliflozin propanediol (FARXIGA) 10 MG TABS tablet Take 1 tablet (10 mg total) by mouth daily before breakfast. 03/21/21  Yes Smith, Henry W, MD  diclofenac Sodium (VOLTAREN) 1 % GEL Apply 1 application topically daily as needed for pain.   Yes [provider]  diphenhydramine-acetaminophen (TYLENOL PM) 25-500 MG TABS tablet Take 2 tablets by mouth at bedtime as needed (pain).   Yes [provider]  gabapentin (NEURONTIN) 100 MG capsule Take 100 mg by mouth 2 (two) times daily.   Yes [provider]  losartan (COZAAR) 50 MG tablet Take 25 mg by mouth daily.   Yes [provider]  rivaroxaban (XARELTO) 20 MG TABS tablet Take 1 tablet (20 mg total) by mouth daily with supper. 06/09/16  Yes Ingold, Laura R, NP  sertraline (ZOLOFT) 100 MG tablet Take 150 mg by mouth daily. 03/24/21  Yes [provider]  simvastatin (ZOCOR) 40 MG tablet Take 1 tablet (40 mg total) by mouth at bedtime. 08/20/20  Yes Smith, Henry W, MD  spironolactone (ALDACTONE) 25 MG tablet Take 0.5 tablets  (12.5 mg total) by mouth daily. 01/27/21  Yes Smith, Henry W, MD  traMADol (ULTRAM) 50 MG tablet Take 50-100 mg by mouth at bedtime. 06/23/16  Yes [provider]  losartan (COZAAR) 25 MG tablet Take 1 tablet (25 mg total) by mouth daily. Patient not taking: No sig reported 03/21/21   Smith, Henry W, MD    Physical Exam: There were no vitals filed for this visit.  GENERAL: No acute distress.  Appears well.  HEENT: MMM.  Vision and hearing grossly intact.  NECK: Supple.  No apparent JVD.  RESP: On RA.  No IWOB. Good air movement bilaterally. CVS:  RRR. Heart sounds normal.  ABD/GI/GU: Bowel sounds present. Soft. Non tender.  MSK/EXT:  Moves extremities.  Right knee swelling. Limited range of motion due to pain. SKIN:  Erythema at the site of arthrocentesis.increased warmth to touch over right knee. NEURO:   Awake, alert and oriented appropriately.  No gross deficit.  PSYCH: Calm. Normal affect.   Personally Reviewed Radiological Exams No results found.   Personally Reviewed Labs: CBC: No results for input(s): WBC, NEUTROABS, HGB, HCT, MCV, PLT in the last 168 hours. Basic Metabolic Panel: No results for input(s): NA, K, CL, CO2, GLUCOSE, BUN, CREATININE, CALCIUM, MG, PHOS in the last 168 hours. GFR: CrCl cannot be calculated (Patient's most recent lab result is older than the maximum 21 days allowed.). Liver Function Tests: No results for input(s): AST, ALT, ALKPHOS, BILITOT, PROT, ALBUMIN in the last 168 hours. No results for input(s): LIPASE, AMYLASE in the last 168 hours. No results for input(s): AMMONIA in the last 168 hours. Coagulation Profile: No results for input(s): INR, PROTIME in the last 168 hours. Cardiac Enzymes: No results for input(s): CKTOTAL, CKMB, CKMBINDEX, TROPONINI in the last 168 hours. BNP (last 3 results) No results for input(s): PROBNP in the last 8760 hours. HbA1C: No results for input(s): HGBA1C in the last 72 hours. CBG: No results for  input(s): GLUCAP in the last 168 hours. Lipid Profile: No results for input(s): CHOL, HDL, LDLCALC, TRIG, CHOLHDL, LDLDIRECT in the last 72 hours. Thyroid Function Tests: No results for input(s): TSH, T4TOTAL, FREET4, T3FREE, THYROIDAB in the last 72 hours. Anemia Panel: No results for input(s): VITAMINB12, FOLATE, FERRITIN, TIBC, IRON, RETICCTPCT in the last 72 hours. Urine analysis:    Component Value Date/Time   COLORURINE STRAW (A) 01/22/2021 0550   APPEARANCEUR CLEAR 01/22/2021 0550   LABSPEC 1.045 (H) 01/22/2021 0550   PHURINE 5.0 01/22/2021 0550   GLUCOSEU NEGATIVE 01/22/2021 0550   HGBUR NEGATIVE 01/22/2021 0550   BILIRUBINUR NEGATIVE 01/22/2021 0550   KETONESUR NEGATIVE 01/22/2021 0550   PROTEINUR NEGATIVE 01/22/2021 0550   NITRITE NEGATIVE 01/22/2021 0550   LEUKOCYTESUR NEGATIVE 01/22/2021 0550    Sepsis Labs:  Lactic acid within normal.    Personally Reviewed EKG:  Twelve-lead EKG pending.  Assessment/Plan Septic arthritis of right knee and patient with history of remote total knee replacement-hemodynamically stable. Synovial fluid from [facility] office with 79,000 white blood cells and 93% neutrophils, and GPC in pairs concerning for streptococcal infection.  CRP 32.  ESR 139.  History of anaphylaxis with penicillin although patient reports tolerating penicillin at the dental office.  No leukocytosis.  Lactic acid within normal.  HDS.  -Blood cultures. -IV vancomycin per pharmacy -Per patient and wife, Dr. Swinteck from [facility] to take patient to the OR on 8/11 -Pain control with as needed Tylenol, oxycodone and fentanyl based on severity -Hold Xarelto.  Last dose the evening of 8/8.  IV heparin for chronic PE/DVT -Patient has significant cardiac history but seems to be optimized and compensated. -We will be cautious with IV fluid perioperatively given his CHF.  Chronic systolic CHF: TTE in 01/2021 with LVEF of 30 to 35%, RWMA.  Patient appears to be well  compensated.  Not on Lasix. -Continue home Coreg, losartan, Aldactone and Farxiga -Strict intake and output and daily weight -Be cautious with IV fluid perioperatively  History of CAD/STEMI s/p CABG-no anginal symptoms. -Cardiac meds as above -Continue home statin  PE/DVT on Xarelto at home -IV heparin perioperatively  Essential HTN: Normotensive. -Cardiac meds as above  Normocytic anemia: Hgb lower than baseline but no recent value. Recent Labs    01/22/21 0315 01/22/21 0320 05/24/21 1847  HGB 13.4 13.6 11.3*  -Recheck H&H in the morning  Osteoarthritis/radiculopathy -Pain meds as above -Continue gabapentin  Elevated alkaline phosphatase-osseous   source?  LFT within normal. -Check GGT  OSA not on CPAP: Reportedly lost weight and has not used CPAP in over 10 years.  Anxiety/PTSD -Continue home Zoloft  Thrombocytosis: Likely reactive. -Recheck in the morning  DVT prophylaxis: On IV heparin for anticoagulation  Code Status: Full code Family Communication: Updated patient's wife at bedside  Disposition Plan: Admit to telemetry Consults called: Expecting [facility] to see patient in the morning Admission status: Inpatient Level of care: Telemetry   Taye T Gonfa MD [facility]  If 7PM-7AM, please contact night-coverage [email or web address]  05/24/2021, 6:37 PM   

## 2021-05-24 NOTE — Progress Notes (Signed)
Pharmacy Antibiotic Note  Patrick Fritz is a 70 y.o. male admitted on 05/24/2021 with septic joint. Per Ortho, office cultures grew strep Pharmacy has been consulted for vancomycin dosing.  Plan: Vancomycin 1500 mg IV now, then 1750 mg IV q24 hr (est AUC 486 based on SCr 1.0; Vd 0.5) Measure vancomycin AUC at steady state as indicated Recommend narrowing vancomycin to ceftriaxone given strep growth on office Cx - contacted MD twice on 8/9 but received no response    Temp (24hrs), Avg:98.3 F (36.8 C), Min:98.3 F (36.8 C), Max:98.3 F (36.8 C)  Recent Labs  Lab 05/24/21 1847 05/24/21 1918  WBC 10.4  --   CREATININE 1.00  --   LATICACIDVEN  --  1.3    Estimated Creatinine Clearance: 79.4 mL/min (by C-G formula based on SCr of 1 mg/dL).    Allergies  Allergen Reactions   Penicillins Anaphylaxis    Has patient had a PCN reaction causing immediate rash, facial/tongue/throat swelling, SOB or lightheadedness with hypotension: Yes Has patient had a PCN reaction causing severe rash involving mucus membranes or skin necrosis: No Has patient had a PCN reaction that required hospitalization Yes Has patient had a PCN reaction occurring within the last 10 years: No If all of the above answers are "NO", then may proceed with Cephalosporin use.      Thank you for allowing pharmacy to be a part of this patient's care.  Patrick Fritz A 05/24/2021 9:25 PM

## 2021-05-24 NOTE — Progress Notes (Signed)
ANTICOAGULATION CONSULT NOTE - Initial Consult  Pharmacy Consult for heparin IV Indication: Hx VTE  Allergies  Allergen Reactions   Penicillins Anaphylaxis    Has patient had a PCN reaction causing immediate rash, facial/tongue/throat swelling, SOB or lightheadedness with hypotension: Yes Has patient had a PCN reaction causing severe rash involving mucus membranes or skin necrosis: No Has patient had a PCN reaction that required hospitalization Yes Has patient had a PCN reaction occurring within the last 10 years: No If all of the above answers are "NO", then may proceed with Cephalosporin use.     Patient Measurements:   Heparin Dosing Weight: 94 kg  Vital Signs: Temp: 98.3 F (36.8 C) (08/09 1950) Temp Source: Oral (08/09 1950) BP: 114/61 (08/09 1950) Pulse Rate: 76 (08/09 1950)  Labs: Recent Labs    05/24/21 1847 05/24/21 1850  HGB 11.3*  --   HCT 35.8*  --   PLT 579*  --   APTT 42*  --   LABPROT 16.8*  --   INR 1.4*  --   HEPARINUNFRC  --  >1.10*  CREATININE 1.00  --     Estimated Creatinine Clearance: 79.4 mL/min (by C-G formula based on SCr of 1 mg/dL).   Medical History: Past Medical History:  Diagnosis Date   DJD (degenerative joint disease), lumbar    DVT (deep venous thrombosis) (HCC)    Glaucoma    R eye diminished vision approx 50%   High cholesterol    Hyperlipidemia    Hypertension    IFG (impaired fasting glucose)    OSA (obstructive sleep apnea)    Severe w AHI 34/hr on HST no on BiPAP at 19/15cm H2O   PE (pulmonary embolism)    Second occurance,enknown etiology,lifelong anticoagulation    Medications:  Medications Prior to Admission  Medication Sig Dispense Refill Last Dose   acetaminophen (TYLENOL) 325 MG tablet Take 650 mg by mouth every 6 (six) hours as needed for moderate pain.   05/24/2021   allopurinol (ZYLOPRIM) 300 MG tablet Take 300 mg by mouth daily.    05/24/2021   carvedilol (COREG) 3.125 MG tablet Take 1 tablet (3.125 mg  total) by mouth 2 (two) times daily. 180 tablet 3 05/24/2021 at 7.30 am   dapagliflozin propanediol (FARXIGA) 10 MG TABS tablet Take 1 tablet (10 mg total) by mouth daily before breakfast. 90 tablet 3 05/24/2021   diclofenac Sodium (VOLTAREN) 1 % GEL Apply 1 application topically daily as needed for pain.   05/24/2021   diphenhydramine-acetaminophen (TYLENOL PM) 25-500 MG TABS tablet Take 2 tablets by mouth at bedtime as needed (pain).   05/23/2021   gabapentin (NEURONTIN) 100 MG capsule Take 100 mg by mouth 2 (two) times daily.   05/24/2021   losartan (COZAAR) 50 MG tablet Take 25 mg by mouth daily.   05/24/2021   rivaroxaban (XARELTO) 20 MG TABS tablet Take 1 tablet (20 mg total) by mouth daily with supper. 30 tablet 2 05/23/2021 at 10 pm   sertraline (ZOLOFT) 100 MG tablet Take 150 mg by mouth daily.   05/24/2021   simvastatin (ZOCOR) 40 MG tablet Take 1 tablet (40 mg total) by mouth at bedtime. 90 tablet 3 05/23/2021   spironolactone (ALDACTONE) 25 MG tablet Take 0.5 tablets (12.5 mg total) by mouth daily. 45 tablet 3 05/24/2021   traMADol (ULTRAM) 50 MG tablet Take 50-100 mg by mouth at bedtime.   05/23/2021   losartan (COZAAR) 25 MG tablet Take 1 tablet (25 mg total) by  mouth daily. (Patient not taking: No sig reported) 90 tablet 3 Not Taking   Scheduled:   docusate sodium  100 mg Oral BID   Assessment: 1 yoM on Xarelto PTA for Hx DVT/PE, admitted directly from Ortho service for septic joint. Xarelto held for anticipated surgery, and Pharmacy consulted to dose heparin in the meantime.  Baseline INR, aPTT slightly elevated d/t prior Xarelto; Heparin level grossly elevated d/t same Prior anticoagulation: Xarelto 20 mg daily q supper; last dose 8/8 at 10pm  Significant events:  Today, 05/24/2021: CBC: hgb slightly low, Plt elevated SCr at baseline, WNL No bleeding or infusion issues per nursing  Goal of Therapy: Heparin level 0.3-0.7 units/ml Monitor platelets by anticoagulation protocol:  Yes  Plan: Heparin 1300 units/hr IV infusion Check aPTT 8 hrs after start Daily CBC, daily heparin level once stable Monitor for signs of bleeding or thrombosis  Reuel Boom, PharmD, BCPS [phone] 05/24/2021, 9:04 PM

## 2021-05-25 ENCOUNTER — Other Ambulatory Visit: Payer: Self-pay

## 2021-05-25 ENCOUNTER — Telehealth: Payer: Self-pay | Admitting: Interventional Cardiology

## 2021-05-25 LAB — COMPREHENSIVE METABOLIC PANEL
ALT: 33 U/L (ref 0–44)
AST: 31 U/L (ref 15–41)
Albumin: 2.4 g/dL — ABNORMAL LOW (ref 3.5–5.0)
Alkaline Phosphatase: 333 U/L — ABNORMAL HIGH (ref 38–126)
Anion gap: 10 (ref 5–15)
BUN: 18 mg/dL (ref 8–23)
CO2: 27 mmol/L (ref 22–32)
Calcium: 8.8 mg/dL — ABNORMAL LOW (ref 8.9–10.3)
Chloride: 102 mmol/L (ref 98–111)
Creatinine, Ser: 0.85 mg/dL (ref 0.61–1.24)
GFR, Estimated: >60 mL/min (ref >60)
Glucose, Bld: 104 mg/dL — ABNORMAL HIGH (ref 70–99)
Potassium: 4.7 mmol/L (ref 3.5–5.1)
Sodium: 139 mmol/L (ref 135–145)
Total Bilirubin: 0.5 mg/dL (ref 0.3–1.2)
Total Protein: 6.9 g/dL (ref 6.5–8.1)

## 2021-05-25 LAB — BLOOD CULTURE ID PANEL (REFLEXED) - BCID2

## 2021-05-25 LAB — HEPARIN LEVEL (UNFRACTIONATED)
Heparin Unfractionated: 0.34 IU/mL (ref 0.30–0.70)
Heparin Unfractionated: 0.2 IU/mL — ABNORMAL LOW (ref 0.30–0.70)

## 2021-05-25 LAB — CBC
HCT: 33.7 % — ABNORMAL LOW (ref 39.0–52.0)
Hemoglobin: 10.6 g/dL — ABNORMAL LOW (ref 13.0–17.0)
MCH: 29.3 pg (ref 26.0–34.0)
MCHC: 31.5 g/dL (ref 30.0–36.0)
MCV: 93.1 fL (ref 80.0–100.0)
Platelets: 480 10*3/uL — ABNORMAL HIGH (ref 150–400)
RBC: 3.62 MIL/uL — ABNORMAL LOW (ref 4.22–5.81)
RDW: 13.6 % (ref 11.5–15.5)
WBC: 7.4 10*3/uL (ref 4.0–10.5)
nRBC: 0 % (ref 0.0–0.2)

## 2021-05-25 LAB — MAGNESIUM: Magnesium: 2.1 mg/dL (ref 1.7–2.4)

## 2021-05-25 LAB — PHOSPHORUS: Phosphorus: 3.9 mg/dL (ref 2.5–4.6)

## 2021-05-25 LAB — APTT
aPTT: 50 seconds — ABNORMAL HIGH (ref 24–36)
aPTT: 66 seconds — ABNORMAL HIGH (ref 24–36)

## 2021-05-25 LAB — HEMOGLOBIN A1C
Hgb A1c MFr Bld: 6.3 % — ABNORMAL HIGH (ref 4.8–5.6)
Mean Plasma Glucose: 134.11 mg/dL

## 2021-05-25 MED ORDER — EPINEPHRINE 0.3 MG/0.3ML IJ SOAJ
0.3000 mg | Freq: Once | INTRAMUSCULAR | Status: DC | PRN
Start: 2021-05-25 — End: 2021-05-29
  Filled 2021-05-25: qty 0.6

## 2021-05-25 MED ORDER — CHLORHEXIDINE GLUCONATE 4 % EX LIQD
60.0000 mL | Freq: Once | CUTANEOUS | Status: AC
  Administered 2021-05-26: 4 via TOPICAL
  Filled 2021-05-25: qty 60

## 2021-05-25 MED ORDER — HEPARIN BOLUS VIA INFUSION
1500.0000 [IU] | Freq: Once | INTRAVENOUS | Status: AC
  Administered 2021-05-25: 1500 [IU] via INTRAVENOUS
  Filled 2021-05-25: qty 1500

## 2021-05-25 MED ORDER — HEPARIN (PORCINE) 25000 UT/250ML-% IV SOLN
1500.0000 [IU]/h | INTRAVENOUS | Status: DC
  Administered 2021-05-25: 1500 [IU]/h via INTRAVENOUS
  Filled 2021-05-25 (×2): qty 250

## 2021-05-25 MED ORDER — HEPARIN (PORCINE) 25000 UT/250ML-% IV SOLN: 1600.0000 [IU]/h | INTRAVENOUS | Status: DC

## 2021-05-25 MED ORDER — AMOXICILLIN 250 MG PO CAPS
500.0000 mg | ORAL_CAPSULE | Freq: Once | ORAL | Status: AC
  Administered 2021-05-25: 500 mg via ORAL
  Filled 2021-05-25: qty 2

## 2021-05-25 MED ORDER — CEFAZOLIN SODIUM-DEXTROSE 2-4 GM/100ML-% IV SOLN
2.0000 g | Freq: Three times a day (TID) | INTRAVENOUS | Status: DC
  Administered 2021-05-26: 2 g via INTRAVENOUS
  Filled 2021-05-25 (×2): qty 100

## 2021-05-25 MED ORDER — VANCOMYCIN HCL IN DEXTROSE 1-5 GM/200ML-% IV SOLN
1000.0000 mg | INTRAVENOUS | Status: AC
  Administered 2021-05-26: 1000 mg via INTRAVENOUS
  Filled 2021-05-25: qty 200

## 2021-05-25 MED ORDER — DIPHENHYDRAMINE HCL 50 MG/ML IJ SOLN
25.0000 mg | Freq: Once | INTRAMUSCULAR | Status: AC | PRN
  Administered 2021-05-26: 12.5 mg via INTRAVENOUS

## 2021-05-25 MED ORDER — TRANEXAMIC ACID-NACL 1000-0.7 MG/100ML-% IV SOLN
1000.0000 mg | INTRAVENOUS | Status: AC
  Administered 2021-05-26: 1000 mg via INTRAVENOUS
  Filled 2021-05-25: qty 100

## 2021-05-25 MED ORDER — SODIUM CHLORIDE 0.9 % IV SOLN
2.0000 g | INTRAVENOUS | Status: DC
  Administered 2021-05-25: 2 g via INTRAVENOUS
  Filled 2021-05-25: qty 20

## 2021-05-25 MED ORDER — POVIDONE-IODINE 10 % EX SWAB
2.0000 "application " | Freq: Once | CUTANEOUS | Status: AC
  Administered 2021-05-26: 2 via TOPICAL

## 2021-05-25 MED ORDER — HEPARIN (PORCINE) 25000 UT/250ML-% IV SOLN
1500.0000 [IU]/h | INTRAVENOUS | Status: DC
  Filled 2021-05-25: qty 250

## 2021-05-25 NOTE — Progress Notes (Signed)
PROGRESS NOTE    Patrick Fritz  [id number] DOB: [birth year] DOA: 05/24/2021 PCP: Mayra Neer, MD   Brief Narrative: Patrick Fritz is a 70 y.o. male with PMH of bilateral TKA, systolic CHF, CAD/STEMI s/p CABG in 2017, PE/DVT on Xarelto, HTN, anxiety, PTSD, osteoarthritis/radiculopathy and OSA not on CPAP presenting with right knee pain and swelling.   Patient reports worsening of chronic right knee pain over the last 2 weeks.  He presented to ED on 7/29 and had an x-ray and venous Doppler that showed knee joint effusion with loose body in Baker's cyst.  He was discharged home to follow-up with orthopedic surgery.  He was seen at [facility] 6 days ago and had arthrocentesis.  He returned to [facility] for follow-up today, and directed to hospital with concern for septic arthritis.  Synovial fluid from [facility] with 79,000 nucleated cells with 93.5% neutrophils and GPC in pairs.    Patient has chills but no fever.  Right knee feels hot at times.  He rates his pain 9/10 when he was at [facility] but improved to 7/10 here without medication.  He also noted swelling and limited range of motion due to pain.  He denies numbness or tingling.  He denies chest pain at rest or with exertion, cough, nausea, vomiting or UTI symptoms other than nocturnal polyuria.  He reports "a little" shortness of breath unchanged from baseline.  Last dose on his Xarelto was the evening of 8/8.  He has bilateral TKA was 26 years ago.  He denies history of diabetes.  Patient stated that his penicillin allergy was when he was very young.  He reports using penicillin prophylactically during dental procedures. Patient lives with his wife.  Denies smoking cigarette.  Admits to social alcohol.  Denies recreational drug use.  Prefers to remain full code.   On arrival, vital signs within normal.  ALP 422.  WBC 10.4.  Hgb 11.3.  Platelet 579.  Otherwise, CBC and CMP without significant finding.  CRP elevated to 32.2.   Lactic acid 1.3.  INR 1.4.  APTT 42.  COVID-19 and influenza PCR nonreactive.  Blood cultures obtained.  Patient was started on IV vancomycin for septic arthritis of right knee and IV heparin for chronic PE/DVT.  Per patient, Dr. Lyla Glassing from [facility] to do arthroscopic washout of his right knee on 8/11.  Assessment & Plan:   Active Problems:   Septic arthritis of knee, right (HCC)  #1  Right knee septic arthritis culture done in the office growing strep Will DC vancomycin and start Rocephin. Plan 4 OR tomorrow 05/26/2021  #2 history of chronic PE and DVT on Xarelto at home.  This has been on hold continue IV heparin perioperatively.  #3 history of CAD CABG and systolic CHF-continue home meds including Coreglosartan, Aldactone and Farxiga -Strict intake and output and daily weight -Be cautious with IV fluid perioperatively  #4HTN BP SOFT 112/68 .HOLD aldactone he is npo after mn.restart post op    Estimated body mass index is 30.18 kg/m as calculated from the following:   Height as of this encounter: '5\' 10"'$  (1.778 m).   Weight as of this encounter: 95.4 kg.  DVT prophylaxis: HEPARIN Code Status: FULL Family Communication: none at bed side Disposition Plan:  Status is: Inpatient  Remains inpatient appropriate because:IV treatments appropriate due to intensity of illness or inability to take PO  Dispo: The patient is from: Home  Anticipated d/c is to: Home              Patient currently is not medically stable to d/c.   Difficult to place patient No       Consultants:  ortho  Procedures: rocephin Antimicrobials:rocephin  Subjective: No c/o resting in bed   Right knee swollen  Objective: Vitals:   05/25/21 1325 05/25/21 1340 05/25/21 1355 05/25/21 1410  BP: 113/63 109/72 111/61 112/69  Pulse: 62 66 64 65  Resp: '20 20 20 20  '$ Temp: 98.6 F (37 C) (!) 96.5 F (35.8 C)    TempSrc:  Axillary    SpO2: 100% 99% 98% 98%  Weight:      Height:         Intake/Output Summary (Last 24 hours) at 05/25/2021 1607 Last data filed at 05/25/2021 0400 Gross per 24 hour  Intake --  Output 601 ml  Net -601 ml   Filed Weights   05/25/21 0452  Weight: 95.4 kg    Examination:  General exam: Appears calm and comfortable  Respiratory system: Clear to auscultation. Respiratory effort normal. Cardiovascular system: S1 & S2 heard, RRR. No JVD, murmurs, rubs, gallops or clicks. No pedal edema. Gastrointestinal system: Abdomen is nondistended, soft and nontender. No organomegaly or masses felt. Normal bowel sounds heard. Central nervous system: Alert and oriented. No focal neurological deficits. Extremities: right knee swollen  Skin: No rashes, lesions or ulcers Psychiatry: Judgement and insight appear normal. Mood & affect appropriate.     Data Reviewed: I have personally reviewed following labs and imaging studies  CBC: Recent Labs  Lab 05/24/21 1847 05/25/21 0609  WBC 10.4 7.4  NEUTROABS 8.2*  --   HGB 11.3* 10.6*  HCT 35.8* 33.7*  MCV 93.0 93.1  PLT 579* 0000000*   Basic Metabolic Panel: Recent Labs  Lab 05/24/21 1847 05/25/21 0609  NA 138 139  K 4.4 4.7  CL 99 102  CO2 26 27  GLUCOSE 102* 104*  BUN 16 18  CREATININE 1.00 0.85  CALCIUM 9.1 8.8*  MG 2.0 2.1  PHOS  --  3.9   GFR: Estimated Creatinine Clearance: 91.1 mL/min (by C-G formula based on SCr of 0.85 mg/dL). Liver Function Tests: Recent Labs  Lab 05/24/21 1847 05/25/21 0609  AST 41 31  ALT 42 33  ALKPHOS 422* 333*  BILITOT 0.6 0.5  PROT 7.7 6.9  ALBUMIN 2.7* 2.4*   No results for input(s): LIPASE, AMYLASE in the last 168 hours. No results for input(s): AMMONIA in the last 168 hours. Coagulation Profile: Recent Labs  Lab 05/24/21 1847  INR 1.4*   Cardiac Enzymes: No results for input(s): CKTOTAL, CKMB, CKMBINDEX, TROPONINI in the last 168 hours. BNP (last 3 results) No results for input(s): PROBNP in the last 8760 hours. HbA1C: Recent Labs     05/24/21 1847  HGBA1C 6.3*   CBG: No results for input(s): GLUCAP in the last 168 hours. Lipid Profile: No results for input(s): CHOL, HDL, LDLCALC, TRIG, CHOLHDL, LDLDIRECT in the last 72 hours. Thyroid Function Tests: No results for input(s): TSH, T4TOTAL, FREET4, T3FREE, THYROIDAB in the last 72 hours. Anemia Panel: No results for input(s): VITAMINB12, FOLATE, FERRITIN, TIBC, IRON, RETICCTPCT in the last 72 hours. Sepsis Labs: Recent Labs  Lab 05/24/21 1918 05/24/21 2043  LATICACIDVEN 1.3 1.6    Recent Results (from the past 240 hour(s))  Culture, blood (routine x 2)     Status: None (Preliminary result)   Collection Time: 05/24/21  6:19  PM   Specimen: BLOOD  Result Value Ref Range Status   Specimen Description   Final    BLOOD LEFT ANTECUBITAL Performed at [facility] [address] [place], [place] [address]    Special Requests   Final    Blood Culture adequate volume BOTTLES DRAWN AEROBIC AND ANAEROBIC Performed at [facility] [address], [place], [place] [address]    Culture   Final    NO GROWTH < 12 HOURS Performed at [facility] [address] [place], [place] [address]    Report Status PENDING  Incomplete  Resp Panel by RT-PCR (Flu A&B, Covid) Nasopharyngeal Swab     Status: None   Collection Time: 05/24/21  6:35 PM   Specimen: Nasopharyngeal Swab; Nasopharyngeal(NP) swabs in vial transport medium  Result Value Ref Range Status   SARS Coronavirus 2 by RT PCR NEGATIVE NEGATIVE Final    Comment: (NOTE) SARS-CoV-2 target nucleic acids are NOT DETECTED.  The SARS-CoV-2 RNA is generally detectable in upper respiratory specimens during the acute phase of infection. The lowest concentration of SARS-CoV-2 viral copies this assay can detect is 138 copies/mL. A negative result does not preclude SARS-Cov-2 infection and should not be used as the sole basis for treatment or other patient management decisions. A  negative result may occur with  improper specimen collection/handling, submission of specimen other than nasopharyngeal swab, presence of viral mutation(s) within the areas targeted by this assay, and inadequate number of viral copies(<138 copies/mL). A negative result must be combined with clinical observations, patient history, and epidemiological information. The expected result is Negative.  Fact Sheet for Patients:  EntrepreneurPulse.com.au  Fact Sheet for Healthcare Providers:  IncredibleEmployment.be  This test is no t yet approved or cleared by the [place] FDA and  has been authorized for detection and/or diagnosis of SARS-CoV-2 by FDA under an Emergency Use Authorization (EUA). This EUA will remain  in effect (meaning this test can be used) for the duration of the COVID-19 declaration under Section 564(b)(1) of the Act, 21 U.S.C.section 360bbb-3(b)(1), unless the authorization is terminated  or revoked sooner.       Influenza A by PCR NEGATIVE NEGATIVE Final   Influenza B by PCR NEGATIVE NEGATIVE Final    Comment: (NOTE) The Xpert Xpress SARS-CoV-2/FLU/RSV plus assay is intended as an aid in the diagnosis of influenza from Nasopharyngeal swab specimens and should not be used as a sole basis for treatment. Nasal washings and aspirates are unacceptable for Xpert Xpress SARS-CoV-2/FLU/RSV testing.  Fact Sheet for Patients: EntrepreneurPulse.com.au  Fact Sheet for Healthcare Providers: IncredibleEmployment.be  This test is not yet approved or cleared by the [place] FDA and has been authorized for detection and/or diagnosis of SARS-CoV-2 by FDA under an Emergency Use Authorization (EUA). This EUA will remain in effect (meaning this test can be used) for the duration of the COVID-19 declaration under Section 564(b)(1) of the Act, 21 U.S.C. section 360bbb-3(b)(1), unless the authorization  is terminated or revoked.  Performed at [facility], [place] [address] [place], [place] [address]   Culture, blood (routine x 2)     Status: None (Preliminary result)   Collection Time: 05/24/21  6:47 PM   Specimen: BLOOD  Result Value Ref Range Status   Specimen Description   Final    BLOOD LEFT ANTECUBITAL Performed at [facility] [address] [place], [place] [address]    Special Requests   Final  BOTTLES DRAWN AEROBIC AND ANAEROBIC Blood Culture adequate volume Performed at [facility] [address] [place], [place] [address]    Culture   Final    NO GROWTH < 12 HOURS Performed at [facility] [address] [place],  [address]    Report Status PENDING  Incomplete         Radiology Studies: No results found.      Scheduled Meds:  allopurinol  300 mg Oral Daily   carvedilol  3.125 mg Oral BID   dapagliflozin propanediol  10 mg Oral QAC breakfast   docusate sodium  100 mg Oral BID   gabapentin  100 mg Oral BID   losartan  25 mg Oral Daily   sertraline  150 mg Oral Daily   simvastatin  40 mg Oral QHS   spironolactone  12.5 mg Oral Daily   Continuous Infusions:  cefTRIAXone (ROCEPHIN)  IV 2 g (05/25/21 1539)   heparin 1,500 Units/hr (05/25/21 0944)     LOS: 1 day    Time spent: 75 min    Georgette Shell, MD  05/25/2021, 4:07 PM

## 2021-05-25 NOTE — Progress Notes (Signed)
[facility] for heparin IV Indication: Hx VTE  Allergies  Allergen Reactions   Penicillins Anaphylaxis    Has patient had a PCN reaction causing immediate rash, facial/tongue/throat swelling, SOB or lightheadedness with hypotension: Yes Has patient had a PCN reaction causing severe rash involving mucus membranes or skin necrosis: No Has patient had a PCN reaction that required hospitalization Yes Has patient had a PCN reaction occurring within the last 10 years: No If all of the above answers are "NO", then may proceed with Cephalosporin use.     Patient Measurements: Height: '5\' 10"'$  (177.8 cm) Weight: 95.4 kg (210 lb 5.1 oz) IBW/kg (Calculated) : 73 Heparin Dosing Weight: 94 kg  Vital Signs: Temp: 99.7 F (37.6 C) (08/10 0357) Temp Source: Oral (08/10 0357) BP: 120/68 (08/10 0357) Pulse Rate: 69 (08/10 0357)  Labs: Recent Labs    05/24/21 1847 05/24/21 1850 05/25/21 0609  HGB 11.3*  --  10.6*  HCT 35.8*  --  33.7*  PLT 579*  --  480*  APTT 42*  --  50*  LABPROT 16.8*  --   --   INR 1.4*  --   --   HEPARINUNFRC  --  >1.10* 0.34  CREATININE 1.00  --  0.85     Estimated Creatinine Clearance: 91.1 mL/min (by C-G formula based on SCr of 0.85 mg/dL).   Assessment: 89 yoM on Xarelto PTA for Hx DVT/PE, admitted directly from Ortho service for septic joint. Xarelto held for anticipated surgery, and Pharmacy consulted to dose heparin in the meantime.  Baseline INR, aPTT slightly elevated d/t prior Xarelto; Heparin level grossly elevated d/t same Prior anticoagulation: Xarelto 20 mg daily q supper; last dose 8/8 at 10pm  Significant events:  Today, 05/25/2021: First aPTT 50 sec - below goal of 66-102. Heparin level 0.34  CBC: Hg 10.6, Plt elevated SCr at baseline, WNL No bleeding reported  Goal of Therapy: Heparin level 0.3-0.7 units/ml aPTT 66 - 102 sec Monitor platelets by anticoagulation protocol: Yes  Plan: Heparin bolus  1500 units IV x 1 then increase heparin rate to 1500 units/hr Check aPTT & heparin level 8 hrs after bolus & rate increase Daily CBC, daily heparin level once stable Monitor for signs of bleeding or thrombosis  Eudelia Bunch, Pharm.D 05/25/2021 7:45 AM

## 2021-05-25 NOTE — Telephone Encounter (Signed)
   Pt's wife calling, she wanted to speak with Dr. Thompson Caul nurse, she said she wanted for Dr. Tamala Julian to know that pt is having procedure and if he has any recommendations, she also said she needs to talk to her about pt's medications, she said she doesn't have the name of the meds right now but she will give it to the nurse when she calls her back

## 2021-05-25 NOTE — TOC Progression Note (Signed)
Transition of Care [facility]) - Progression Note    Patient Details  Name: Patrick Fritz MRN: [id number] Date of Birth: 08/29/1951  Transition of Care [facility]) CM/SW Contact  Purcell Mouton, RN Phone Number: 05/25/2021, 11:32 AM  Clinical Narrative:     Pt from home with spouse and will discharge home.   Expected Discharge Plan: Home/Self Care Barriers to Discharge: No Barriers Identified  Expected Discharge Plan and Services Expected Discharge Plan: Home/Self Care       Living arrangements for the past 2 months: Single Family Home                                       Social Determinants of Health (SDOH) Interventions    Readmission Risk Interventions No flowsheet data found.

## 2021-05-25 NOTE — Progress Notes (Signed)
PHARMACY - PHYSICIAN COMMUNICATION CRITICAL VALUE ALERT - BLOOD CULTURE IDENTIFICATION (BCID)  Patrick Fritz is an 70 y.o. male who presented to [facility] on 05/24/2021 with a knee infection/septic joint.  Assessment:  Patient is undergoing resection of R TKA with placement of antibiotic spacer tomorrow, 8/11.   BCID + 1/4 GPC in clusters; identified at staph epi (no resistance detected)  Name of physician (or Provider) Contacted: Dr. Rodena Piety  Current antibiotics: Ceftriaxone 2 g IV q24h  Changes to prescribed antibiotics recommended: Could narrow to cefazolin  Results for orders placed or performed during the hospital encounter of 05/24/21  Blood Culture ID Panel (Reflexed) (Collected: 05/24/2021  6:47 PM)  Result Value Ref Range   Enterococcus faecalis NOT DETECTED NOT DETECTED   Enterococcus Faecium NOT DETECTED NOT DETECTED   Listeria monocytogenes NOT DETECTED NOT DETECTED   Staphylococcus species DETECTED (A) NOT DETECTED   Staphylococcus aureus (BCID) NOT DETECTED NOT DETECTED   Staphylococcus epidermidis DETECTED (A) NOT DETECTED   Staphylococcus lugdunensis NOT DETECTED NOT DETECTED   Streptococcus species NOT DETECTED NOT DETECTED   Streptococcus agalactiae NOT DETECTED NOT DETECTED   Streptococcus pneumoniae NOT DETECTED NOT DETECTED   Streptococcus pyogenes NOT DETECTED NOT DETECTED   A.calcoaceticus-baumannii NOT DETECTED NOT DETECTED   Bacteroides fragilis NOT DETECTED NOT DETECTED   Enterobacterales NOT DETECTED NOT DETECTED   Enterobacter cloacae complex NOT DETECTED NOT DETECTED   Escherichia coli NOT DETECTED NOT DETECTED   Klebsiella aerogenes NOT DETECTED NOT DETECTED   Klebsiella oxytoca NOT DETECTED NOT DETECTED   Klebsiella pneumoniae NOT DETECTED NOT DETECTED   Proteus species NOT DETECTED NOT DETECTED   Salmonella species NOT DETECTED NOT DETECTED   Serratia marcescens NOT DETECTED NOT DETECTED   Haemophilus influenzae NOT DETECTED NOT DETECTED    Neisseria meningitidis NOT DETECTED NOT DETECTED   Pseudomonas aeruginosa NOT DETECTED NOT DETECTED   Stenotrophomonas maltophilia NOT DETECTED NOT DETECTED   Candida albicans NOT DETECTED NOT DETECTED   Candida auris NOT DETECTED NOT DETECTED   Candida glabrata NOT DETECTED NOT DETECTED   Candida krusei NOT DETECTED NOT DETECTED   Candida parapsilosis NOT DETECTED NOT DETECTED   Candida tropicalis NOT DETECTED NOT DETECTED   Cryptococcus neoformans/gattii NOT DETECTED NOT DETECTED   Methicillin resistance mecA/C NOT DETECTED NOT [place], PharmD 05/25/2021  6:54 PM

## 2021-05-25 NOTE — Progress Notes (Signed)
Plan for resection R TKA, placement of abx spacer tomorrow. NPO after MN. Hold heparin gtt at 0600 tomorrow.

## 2021-05-25 NOTE — Telephone Encounter (Signed)
Spoke with wife and she asked me about pt's meds because he is in the hospital and she wanted to make sure she gave the pharmacy the correct information.  She asked specifically about the "half" tablets.  Advised he takes Spironolactone 12.'5mg'$  QD and Losartan '25mg'$  QD.  Wife appreciative for call.

## 2021-05-25 NOTE — Progress Notes (Signed)
Pharmacy Brief Note - Anticoagulation Follow Up:  Patient is a 27 yoM currently on heparin drip while rivaroxaban is on hold for surgery. For full history, see note by Leodis Sias, PharmD from earlier today.   Assessment: aPTT = 66 seconds is therapeutic but on lower end of therapeutic range at heparin infusion rate of 1500 units/hr. HL (0.2) still not correlating. Confirmed with RN that heparin infusing at correct rate with no interruptions. No signs of bleeding. No line issues.  Goal: HL 0.3 - 0.7; aPTT 66 - 102 seconds  Plan: Increase heparin drip slightly to 1600 units/hr since aPTT on lower end of therapeutic range Check HL and aPTT in 8 hours Monitor for signs of bleeding Heparin drip to stop @ 0600 on 8/11 prior to surgery per MD  Lenis Noon, PharmD 05/25/21 7:16 PM

## 2021-05-25 NOTE — Progress Notes (Addendum)
Penicillin Allergy Note  Patient was noted for having a history of penicillin allergy. Upon questioning he reports that he experienced a reaction about 30 years ago after a penicillin injection. He reports some throat swelling requiring medication treatment, but he did not require intubation. He reports that he has since taken aminopenicillin antibiotics before dental procedures and has tolerated them without reaction. Given he has tolerated this medication previously we will challenge him with oral amoxicillin x1. Nurse will monitor for signs and symptoms of an allergic reaction every 15 minutes for 1 hour. If he tolerates this well, we will plan to start him on ceftriaxone.   PLAN: -amoxicillin oral challenge x1 -monitor every 15 minutes for 1 hour -remove penicillin allergy if he tolerates well -start on ceftriaxone   Vicente Masson PharmD Candidate

## 2021-05-26 ENCOUNTER — Encounter (HOSPITAL_COMMUNITY)
Admission: AD | Disposition: A | Discharge status: Home Health | Payer: Self-pay | Source: Ambulatory Visit | Attending: Internal Medicine

## 2021-05-26 ENCOUNTER — Encounter (HOSPITAL_COMMUNITY): Payer: Self-pay | Admitting: Student

## 2021-05-26 ENCOUNTER — Inpatient Hospital Stay (HOSPITAL_COMMUNITY): Payer: Medicare Other | Admitting: Certified Registered Nurse Anesthetist

## 2021-05-26 ENCOUNTER — Inpatient Hospital Stay (HOSPITAL_COMMUNITY): Payer: Medicare Other

## 2021-05-26 DIAGNOSIS — M00061 Staphylococcal arthritis, right knee: Secondary | ICD-10-CM

## 2021-05-26 HISTORY — PX: EXCISIONAL TOTAL KNEE ARTHROPLASTY WITH ANTIBIOTIC SPACERS: SHX5827

## 2021-05-26 LAB — COMPREHENSIVE METABOLIC PANEL
ALT: 41 U/L (ref 0–44)
AST: 52 U/L — ABNORMAL HIGH (ref 15–41)
Albumin: 2.4 g/dL — ABNORMAL LOW (ref 3.5–5.0)
Alkaline Phosphatase: 350 U/L — ABNORMAL HIGH (ref 38–126)
Anion gap: 9 (ref 5–15)
BUN: 17 mg/dL (ref 8–23)
CO2: 25 mmol/L (ref 22–32)
Calcium: 8.5 mg/dL — ABNORMAL LOW (ref 8.9–10.3)
Chloride: 102 mmol/L (ref 98–111)
Creatinine, Ser: 0.84 mg/dL (ref 0.61–1.24)
GFR, Estimated: >60 mL/min (ref >60)
Glucose, Bld: 123 mg/dL — ABNORMAL HIGH (ref 70–99)
Potassium: 4.2 mmol/L (ref 3.5–5.1)
Sodium: 136 mmol/L (ref 135–145)
Total Bilirubin: 0.4 mg/dL (ref 0.3–1.2)
Total Protein: 6.7 g/dL (ref 6.5–8.1)

## 2021-05-26 LAB — CBC
HCT: 32.3 % — ABNORMAL LOW (ref 39.0–52.0)
Hemoglobin: 10.3 g/dL — ABNORMAL LOW (ref 13.0–17.0)
MCH: 29.5 pg (ref 26.0–34.0)
MCHC: 31.9 g/dL (ref 30.0–36.0)
MCV: 92.6 fL (ref 80.0–100.0)
Platelets: 467 10*3/uL — ABNORMAL HIGH (ref 150–400)
RBC: 3.49 MIL/uL — ABNORMAL LOW (ref 4.22–5.81)
RDW: 13.6 % (ref 11.5–15.5)
WBC: 8 10*3/uL (ref 4.0–10.5)
nRBC: 0 % (ref 0.0–0.2)

## 2021-05-26 LAB — APTT: aPTT: 85 seconds — ABNORMAL HIGH (ref 24–36)

## 2021-05-26 LAB — MRSA NEXT GEN BY PCR, NASAL: MRSA by PCR Next Gen: NOT DETECTED

## 2021-05-26 LAB — HEPARIN LEVEL (UNFRACTIONATED): Heparin Unfractionated: 0.36 IU/mL (ref 0.30–0.70)

## 2021-05-26 SURGERY — REMOVAL, TOTAL ARTHROPLASTY HARDWARE, KNEE, WITH ANTIBIOTIC SPACER INSERTION
Anesthesia: General | Site: Knee | Laterality: Right

## 2021-05-26 MED ORDER — METHOCARBAMOL 500 MG PO TABS: 500.0000 mg | ORAL_TABLET | Freq: Four times a day (QID) | ORAL | Status: DC | PRN

## 2021-05-26 MED ORDER — 0.9 % SODIUM CHLORIDE (POUR BTL) OPTIME
TOPICAL | Status: DC | PRN
  Administered 2021-05-26: 1000 mL

## 2021-05-26 MED ORDER — METOCLOPRAMIDE HCL 5 MG PO TABS: 5.0000 mg | ORAL_TABLET | Freq: Three times a day (TID) | ORAL | Status: DC | PRN

## 2021-05-26 MED ORDER — DIPHENHYDRAMINE HCL 12.5 MG/5ML PO ELIX: 12.5000 mg | ORAL_SOLUTION | ORAL | Status: DC | PRN

## 2021-05-26 MED ORDER — VANCOMYCIN HCL 1000 MG IV SOLR
INTRAVENOUS | Status: AC
  Filled 2021-05-26: qty 5000

## 2021-05-26 MED ORDER — SODIUM CHLORIDE 0.9 % IV SOLN: 2.0000 g | INTRAVENOUS | Status: DC

## 2021-05-26 MED ORDER — DEXAMETHASONE SODIUM PHOSPHATE 10 MG/ML IJ SOLN
INTRAMUSCULAR | Status: DC | PRN
Start: 2021-05-26 — End: 2021-05-26
  Administered 2021-05-26: 4 mg via INTRAVENOUS

## 2021-05-26 MED ORDER — DEXAMETHASONE SODIUM PHOSPHATE 10 MG/ML IJ SOLN
INTRAMUSCULAR | Status: AC
  Filled 2021-05-26: qty 1

## 2021-05-26 MED ORDER — METHOCARBAMOL 1000 MG/10ML IJ SOLN
500.0000 mg | Freq: Four times a day (QID) | INTRAVENOUS | Status: DC | PRN
  Filled 2021-05-26: qty 5

## 2021-05-26 MED ORDER — DIPHENHYDRAMINE HCL 50 MG/ML IJ SOLN
INTRAMUSCULAR | Status: AC
  Filled 2021-05-26: qty 1

## 2021-05-26 MED ORDER — ROCURONIUM BROMIDE 10 MG/ML (PF) SYRINGE
PREFILLED_SYRINGE | INTRAVENOUS | Status: AC
  Filled 2021-05-26: qty 10

## 2021-05-26 MED ORDER — ROCURONIUM BROMIDE 10 MG/ML (PF) SYRINGE
PREFILLED_SYRINGE | INTRAVENOUS | Status: DC | PRN
  Administered 2021-05-26: 60 mg via INTRAVENOUS

## 2021-05-26 MED ORDER — ONDANSETRON HCL 4 MG/2ML IJ SOLN
INTRAMUSCULAR | Status: AC
  Filled 2021-05-26: qty 2

## 2021-05-26 MED ORDER — PHENYLEPHRINE HCL-NACL 20-0.9 MG/250ML-% IV SOLN
INTRAVENOUS | Status: DC | PRN
  Administered 2021-05-26: 20 ug/min via INTRAVENOUS

## 2021-05-26 MED ORDER — MEPERIDINE HCL 50 MG/ML IJ SOLN: 6.2500 mg | INTRAMUSCULAR | Status: DC | PRN

## 2021-05-26 MED ORDER — KETOROLAC TROMETHAMINE 30 MG/ML IJ SOLN
INTRAMUSCULAR | Status: AC
  Filled 2021-05-26: qty 1

## 2021-05-26 MED ORDER — SODIUM CHLORIDE 0.9 % IR SOLN
Status: DC | PRN
  Administered 2021-05-26: 3000 mL

## 2021-05-26 MED ORDER — ADULT MULTIVITAMIN W/MINERALS CH
1.0000 | ORAL_TABLET | Freq: Every day | ORAL | Status: DC
  Filled 2021-05-26: qty 1

## 2021-05-26 MED ORDER — ACETAMINOPHEN 325 MG PO TABS
325.0000 mg | ORAL_TABLET | Freq: Four times a day (QID) | ORAL | Status: DC | PRN
Start: 2021-05-27 — End: 2021-05-29

## 2021-05-26 MED ORDER — LACTATED RINGERS IV SOLN: INTRAVENOUS | Status: DC

## 2021-05-26 MED ORDER — VANCOMYCIN HCL 1000 MG IV SOLR
INTRAVENOUS | Status: AC
  Filled 2021-05-26: qty 3000

## 2021-05-26 MED ORDER — PROPOFOL 10 MG/ML IV BOLUS
INTRAVENOUS | Status: AC
  Filled 2021-05-26: qty 20

## 2021-05-26 MED ORDER — PHENYLEPHRINE 40 MCG/ML (10ML) SYRINGE FOR IV PUSH (FOR BLOOD PRESSURE SUPPORT)
PREFILLED_SYRINGE | INTRAVENOUS | Status: DC | PRN
  Administered 2021-05-26 (×2): 80 ug via INTRAVENOUS

## 2021-05-26 MED ORDER — BUPIVACAINE-EPINEPHRINE (PF) 0.25% -1:200000 IJ SOLN
INTRAMUSCULAR | Status: AC
  Filled 2021-05-26: qty 30

## 2021-05-26 MED ORDER — AMISULPRIDE (ANTIEMETIC) 5 MG/2ML IV SOLN: 10.0000 mg | Freq: Once | INTRAVENOUS | Status: DC | PRN

## 2021-05-26 MED ORDER — DEXAMETHASONE SODIUM PHOSPHATE 4 MG/ML IJ SOLN
INTRAMUSCULAR | Status: DC | PRN
  Administered 2021-05-26: 5 mg via INTRAVENOUS

## 2021-05-26 MED ORDER — HYDROCODONE-ACETAMINOPHEN 5-325 MG PO TABS
1.0000 | ORAL_TABLET | ORAL | Status: DC | PRN
  Administered 2021-05-26 – 2021-05-27 (×2): 1 via ORAL
  Administered 2021-05-27 – 2021-05-28 (×2): 2 via ORAL
  Administered 2021-05-28: 1 via ORAL
  Administered 2021-05-28: 2 via ORAL
  Administered 2021-05-29: 1 via ORAL
  Filled 2021-05-26 (×3): qty 2
  Filled 2021-05-26: qty 1
  Filled 2021-05-26: qty 2
  Filled 2021-05-26 (×2): qty 1

## 2021-05-26 MED ORDER — TOBRAMYCIN SULFATE 1.2 G IJ SOLR
INTRAMUSCULAR | Status: DC | PRN
  Administered 2021-05-26: 9.6 g

## 2021-05-26 MED ORDER — SENNA 8.6 MG PO TABS
1.0000 | ORAL_TABLET | Freq: Two times a day (BID) | ORAL | Status: DC
  Administered 2021-05-27 (×2): 8.6 mg via ORAL
  Filled 2021-05-26 (×6): qty 1

## 2021-05-26 MED ORDER — PHENYLEPHRINE HCL (PRESSORS) 10 MG/ML IV SOLN: INTRAVENOUS | Status: DC | PRN

## 2021-05-26 MED ORDER — BUPIVACAINE-EPINEPHRINE (PF) 0.5% -1:200000 IJ SOLN
INTRAMUSCULAR | Status: DC | PRN
  Administered 2021-05-26: 30 mL via PERINEURAL

## 2021-05-26 MED ORDER — FENTANYL CITRATE (PF) 100 MCG/2ML IJ SOLN
INTRAMUSCULAR | Status: AC
  Administered 2021-05-26: 50 ug via INTRAVENOUS
  Filled 2021-05-26: qty 2

## 2021-05-26 MED ORDER — HYDROMORPHONE HCL 1 MG/ML IJ SOLN
INTRAMUSCULAR | Status: AC
  Administered 2021-05-26: 0.25 mg via INTRAVENOUS
  Filled 2021-05-26: qty 1

## 2021-05-26 MED ORDER — SODIUM CHLORIDE 0.9 % IV SOLN: INTRAVENOUS | Status: DC

## 2021-05-26 MED ORDER — ONDANSETRON HCL 4 MG/2ML IJ SOLN: 4.0000 mg | Freq: Four times a day (QID) | INTRAMUSCULAR | Status: DC | PRN

## 2021-05-26 MED ORDER — SUCCINYLCHOLINE CHLORIDE 200 MG/10ML IV SOSY
PREFILLED_SYRINGE | INTRAVENOUS | Status: AC
  Filled 2021-05-26: qty 10

## 2021-05-26 MED ORDER — POVIDONE-IODINE 10 % EX SWAB
2.0000 "application " | Freq: Once | CUTANEOUS | Status: AC
  Administered 2021-05-26: 2 via TOPICAL

## 2021-05-26 MED ORDER — CHLORHEXIDINE GLUCONATE 0.12 % MT SOLN
15.0000 mL | Freq: Once | OROMUCOSAL | Status: AC
  Administered 2021-05-26: 15 mL via OROMUCOSAL

## 2021-05-26 MED ORDER — STERILE WATER FOR IRRIGATION IR SOLN
Status: DC | PRN
  Administered 2021-05-26: 2000 mL

## 2021-05-26 MED ORDER — PROPOFOL 10 MG/ML IV BOLUS
INTRAVENOUS | Status: DC | PRN
  Administered 2021-05-26: 120 mg via INTRAVENOUS

## 2021-05-26 MED ORDER — DOCUSATE SODIUM 100 MG PO CAPS
100.0000 mg | ORAL_CAPSULE | Freq: Two times a day (BID) | ORAL | Status: DC
  Administered 2021-05-26 – 2021-05-27 (×3): 100 mg via ORAL
  Filled 2021-05-26 (×6): qty 1

## 2021-05-26 MED ORDER — FENTANYL CITRATE (PF) 250 MCG/5ML IJ SOLN
INTRAMUSCULAR | Status: DC | PRN
  Administered 2021-05-26 (×2): 25 ug via INTRAVENOUS
  Administered 2021-05-26 (×2): 50 ug via INTRAVENOUS

## 2021-05-26 MED ORDER — HYDROCODONE-ACETAMINOPHEN 7.5-325 MG PO TABS
1.0000 | ORAL_TABLET | ORAL | Status: DC | PRN
  Administered 2021-05-27: 2 via ORAL
  Filled 2021-05-26: qty 2

## 2021-05-26 MED ORDER — LIDOCAINE 2% (20 MG/ML) 5 ML SYRINGE
INTRAMUSCULAR | Status: AC
  Filled 2021-05-26: qty 5

## 2021-05-26 MED ORDER — TOBRAMYCIN SULFATE 1.2 G IJ SOLR
INTRAMUSCULAR | Status: AC
  Filled 2021-05-26: qty 9.6

## 2021-05-26 MED ORDER — ONDANSETRON HCL 4 MG/2ML IJ SOLN
INTRAMUSCULAR | Status: DC | PRN
  Administered 2021-05-26: 4 mg via INTRAVENOUS

## 2021-05-26 MED ORDER — FENTANYL CITRATE (PF) 100 MCG/2ML IJ SOLN: 50.0000 ug | Freq: Once | INTRAMUSCULAR | Status: AC

## 2021-05-26 MED ORDER — FENTANYL CITRATE (PF) 250 MCG/5ML IJ SOLN
INTRAMUSCULAR | Status: AC
  Filled 2021-05-26: qty 5

## 2021-05-26 MED ORDER — PHENYLEPHRINE 40 MCG/ML (10ML) SYRINGE FOR IV PUSH (FOR BLOOD PRESSURE SUPPORT)
PREFILLED_SYRINGE | INTRAVENOUS | Status: AC
  Filled 2021-05-26: qty 10

## 2021-05-26 MED ORDER — CLONIDINE HCL (ANALGESIA) 100 MCG/ML EP SOLN
EPIDURAL | Status: DC | PRN
  Administered 2021-05-26: 80 ug

## 2021-05-26 MED ORDER — SUGAMMADEX SODIUM 200 MG/2ML IV SOLN
INTRAVENOUS | Status: DC | PRN
  Administered 2021-05-26: 200 mg via INTRAVENOUS

## 2021-05-26 MED ORDER — METOCLOPRAMIDE HCL 5 MG/ML IJ SOLN: 5.0000 mg | Freq: Three times a day (TID) | INTRAMUSCULAR | Status: DC | PRN

## 2021-05-26 MED ORDER — HYDROMORPHONE HCL 1 MG/ML IJ SOLN
0.2500 mg | INTRAMUSCULAR | Status: DC | PRN
  Administered 2021-05-26: 0.25 mg via INTRAVENOUS

## 2021-05-26 MED ORDER — ENSURE ENLIVE PO LIQD
237.0000 mL | Freq: Two times a day (BID) | ORAL | Status: DC
  Administered 2021-05-27 – 2021-05-28 (×4): 237 mL via ORAL
  Filled 2021-05-26 (×3): qty 237

## 2021-05-26 MED ORDER — ONDANSETRON HCL 4 MG PO TABS: 4.0000 mg | ORAL_TABLET | Freq: Four times a day (QID) | ORAL | Status: DC | PRN

## 2021-05-26 MED ORDER — MORPHINE SULFATE (PF) 2 MG/ML IV SOLN: 0.5000 mg | INTRAVENOUS | Status: DC | PRN

## 2021-05-26 MED ORDER — LIDOCAINE 2% (20 MG/ML) 5 ML SYRINGE
INTRAMUSCULAR | Status: DC | PRN
  Administered 2021-05-26: 80 mg via INTRAVENOUS

## 2021-05-26 SURGICAL SUPPLY — 82 items
ADH SKN CLS APL DERMABOND .7 (GAUZE/BANDAGES/DRESSINGS) ×1
APL PRP STRL LF DISP 70% ISPRP (MISCELLANEOUS) ×2
BAG COUNTER SPONGE SURGICOUNT (BAG) ×2 IMPLANT
BAG SPEC THK2 15X12 ZIP CLS (MISCELLANEOUS) ×1
BAG SPNG CNTER NS LX DISP (BAG) ×1
BAG ZIPLOCK 12X15 (MISCELLANEOUS) ×2 IMPLANT
BLADE FLEX CHISEL 8 2.6 (MISCELLANEOUS) ×1 IMPLANT
BLADE SAW RECIPROCATING 77.5 (BLADE) IMPLANT
BLADE SAW SGTL 81X20 HD (BLADE) ×4 IMPLANT
BNDG ELASTIC 4X5.8 VLCR STR LF (GAUZE/BANDAGES/DRESSINGS) ×2 IMPLANT
BNDG ELASTIC 6X5.8 VLCR STR LF (GAUZE/BANDAGES/DRESSINGS) ×2 IMPLANT
BONE CEMENT HUM AFFIXUS (Cement) ×4 IMPLANT
BOWL SMART MIX CTS (DISPOSABLE) ×4 IMPLANT
BUR OVAL CARBIDE 4.0 (BURR) ×6 IMPLANT
CEMENT BONE HUM AFFIXUS (Cement) IMPLANT
CEMENT BONE R 1X40 (Cement) ×3 IMPLANT
CHLORAPREP W/TINT 26 (MISCELLANEOUS) ×4 IMPLANT
COMP FEM PERSONA RT SZ11 (Knees) ×2 IMPLANT
COMPONENT FEM PERSONA RT SZ11 (Knees) IMPLANT
COVER SURGICAL LIGHT HANDLE (MISCELLANEOUS) ×2 IMPLANT
CUFF TOURN SGL QUICK 34 (TOURNIQUET CUFF) ×2
CUFF TRNQT CYL 34X4.125X (TOURNIQUET CUFF) ×1 IMPLANT
DERMABOND ADVANCED (GAUZE/BANDAGES/DRESSINGS) ×1
DERMABOND ADVANCED .7 DNX12 (GAUZE/BANDAGES/DRESSINGS) ×1 IMPLANT
DRAPE SHEET LG 3/4 BI-LAMINATE (DRAPES) ×6 IMPLANT
DRAPE U-SHAPE 47X51 STRL (DRAPES) ×2 IMPLANT
DRESSING PEEL AND PLAC PRVNA20 (GAUZE/BANDAGES/DRESSINGS) IMPLANT
DRSG AQUACEL AG ADV 3.5X10 (GAUZE/BANDAGES/DRESSINGS) ×2 IMPLANT
DRSG PEEL AND PLACE PREVENA 20 (GAUZE/BANDAGES/DRESSINGS) ×2
ELECT BLADE TIP CTD 4 INCH (ELECTRODE) ×2 IMPLANT
ELECT REM PT RETURN 15FT ADLT (MISCELLANEOUS) ×2 IMPLANT
EVACUATOR 1/8 PVC DRAIN (DRAIN) ×1 IMPLANT
EVACUATOR DRAINAGE 10X20 100CC (DRAIN) IMPLANT
EVACUATOR SILICONE 100CC (DRAIN)
GAUZE SPONGE 4X4 12PLY STRL (GAUZE/BANDAGES/DRESSINGS) ×2 IMPLANT
GLOVE SRG 8 PF TXTR STRL LF DI (GLOVE) ×2 IMPLANT
GLOVE SURG ENC MOIS LTX SZ7.5 (GLOVE) ×2 IMPLANT
GLOVE SURG LTX SZ8.5 (GLOVE) ×4 IMPLANT
GLOVE SURG UNDER POLY LF SZ8 (GLOVE) ×4
GLOVE SURG UNDER POLY LF SZ8.5 (GLOVE) ×2 IMPLANT
GOWN SPEC L3 XXLG W/TWL (GOWN DISPOSABLE) ×2 IMPLANT
GOWN SPEC L4 XLG W/TWL (GOWN DISPOSABLE) ×2 IMPLANT
HANDPIECE INTERPULSE COAX TIP (DISPOSABLE) ×2
HOOD PEEL AWAY FLYTE STAYCOOL (MISCELLANEOUS) ×6 IMPLANT
IMMOBILIZER KNEE 20 (SOFTGOODS)
IMMOBILIZER KNEE 20 THIGH 36 (SOFTGOODS) IMPLANT
INSERT TIB ASF GH/7-12 18 RT (Insert) ×1 IMPLANT
JET LAVAGE IRRISEPT WOUND (IRRIGATION / IRRIGATOR) ×6
KIT TURNOVER KIT A (KITS) ×2 IMPLANT
LAVAGE JET IRRISEPT WOUND (IRRIGATION / IRRIGATOR) ×3 IMPLANT
MANIFOLD NEPTUNE II (INSTRUMENTS) ×2 IMPLANT
MARKER SKIN DUAL TIP RULER LAB (MISCELLANEOUS) ×2 IMPLANT
NDL SPNL 18GX3.5 QUINCKE PK (NEEDLE) ×1 IMPLANT
NEEDLE SPNL 18GX3.5 QUINCKE PK (NEEDLE) ×2 IMPLANT
NS IRRIG 1000ML POUR BTL (IV SOLUTION) ×4 IMPLANT
PACK TOTAL KNEE CUSTOM (KITS) ×2 IMPLANT
PADDING CAST COTTON 6X4 STRL (CAST SUPPLIES) ×2 IMPLANT
PENCIL SMOKE EVACUATOR (MISCELLANEOUS) ×2 IMPLANT
PROTECTOR NERVE ULNAR (MISCELLANEOUS) ×2 IMPLANT
SAW OSC TIP CART 19.5X105X1.3 (SAW) ×2 IMPLANT
SEALER BIPOLAR AQUA 6.0 (INSTRUMENTS) ×2 IMPLANT
SET HNDPC FAN SPRY TIP SCT (DISPOSABLE) ×1 IMPLANT
SET PAD KNEE POSITIONER (MISCELLANEOUS) ×2 IMPLANT
STAPLER VISISTAT 35W (STAPLE) ×1 IMPLANT
SUT MNCRL AB 3-0 PS2 18 (SUTURE) ×1 IMPLANT
SUT MNCRL AB 4-0 PS2 18 (SUTURE) ×2 IMPLANT
SUT MON AB 2-0 CT1 36 (SUTURE) ×3 IMPLANT
SUT PDS AB 1 CT1 27 (SUTURE) ×3 IMPLANT
SUT STRATAFIX 1PDS 45CM VIOLET (SUTURE) ×1 IMPLANT
SUT STRATAFIX PDO 1 14 VIOLET (SUTURE) ×2
SUT STRATFX PDO 1 14 VIOLET (SUTURE) ×1
SUT VIC AB 1 CTX 36 (SUTURE)
SUT VIC AB 1 CTX36XBRD ANBCTR (SUTURE) ×2 IMPLANT
SUT VIC AB 2-0 CT1 27 (SUTURE)
SUT VIC AB 2-0 CT1 TAPERPNT 27 (SUTURE) ×1 IMPLANT
SUTURE STRATFX PDO 1 14 VIOLET (SUTURE) ×1 IMPLANT
SWAB COLLECTION DEVICE MRSA (MISCELLANEOUS) IMPLANT
SWAB CULTURE ESWAB REG 1ML (MISCELLANEOUS) IMPLANT
TOWER CARTRIDGE SMART MIX (DISPOSABLE) ×1 IMPLANT
TRAY FOLEY MTR SLVR 16FR STAT (SET/KITS/TRAYS/PACK) ×2 IMPLANT
WATER STERILE IRR 1000ML POUR (IV SOLUTION) ×2 IMPLANT
WRAP KNEE MAXI GEL POST OP (GAUZE/BANDAGES/DRESSINGS) ×2 IMPLANT

## 2021-05-26 NOTE — Op Note (Signed)
OPERATIVE REPORT   05/26/2021  4:48 PM  PATIENT:  Patrick Fritz   SURGEON:  Bertram Savin, MD  ASSISTANT:  Cherlynn June, PA-C.   PREOPERATIVE DIAGNOSIS:  periprosthetic RIGHT KNEE INFECTION  POSTOPERATIVE DIAGNOSIS:  Same.  PROCEDURE:  1.  Resection right total knee arthroplasty, femoral, tibial, and patellar components. 2.  Placement of articulating Prostalac spacer.  ANESTHESIA:   GETA.  ANTIBIOTICS: 2 g Ancef.  EXPLANTS: DePuy LCS CR femur size large plus. DePuy LCS bicondylar tibial tray. LCS bicondylar tibial insert. Metal-backed patella.  IMPLANTS: Zimmer persona CR femur size 11. Zimmer persona polyliner insert, size 18 mm, CR. Biomet R bone cement with 2.4 g of tobramycin per pack.  SPECIMENS: None.  COMPLICATIONS: None.  DISPOSITION: Stable to PACU.  SURGICAL INDICATIONS:  Patrick Fritz is a 70 y.o. male with a diagnosis of periprosthetic RIGHT KNEE INFECTION, which was made by synovial fluid aspiration with elevated synovial white blood cell count growing Streptococcus anginosis, as well as elevated sed rate and C-reactive protein.Marland Kitchen  He was admitted to the hospitalist service.  He takes Xarelto for history of pulmonary embolism.  He was placed on a heparin drip which was stopped 6 hours preoperatively.  He was indicated for two-stage procedure.  The risks, benefits, and alternatives were discussed with the patient preoperatively including but not limited to the risks of infection, bleeding, nerve / blood vessel injury, cardiopulmonary complications, the need for repeat surgery, among others, and the patient was willing to proceed.  PROCEDURE IN DETAIL: Patient was identified in the holding area using 2 identifiers.  The surgical site was marked by myself.  He was taken to the operating room, placed supine on the operating table.  General anesthesia was induced.  Both was placed into the right hip.  All bony prominences were well-padded.  The right  lower extremity was prepped and draped in the normal sterile surgical fashion.  Timeout was called, verifying site and site of surgery.  He did receive IV antibiotics within 60 minutes of beginning the procedure.  Esmarch exsanguination was used, and the tourniquet was elevated to 300 mmHg.  I sharply excised his previous anterior knee incision.  Full-thickness skin flaps were created.  Medial parapatellar arthrotomy was performed.  Upon entering the knee, he had purulent joint fluid.  Culture was not sent, as we already had identified the organism.  He also had metallic staining of the synovium.  The synovium appeared chronically inflamed.  A medial release was performed.  The knee was brought into extension, and a radical synovectomy was performed of the medial gutter, lateral gutter, and suprapatellar pouch.  Infrapatellar scar was sharply excised with Bovie electrocautery.  I then examined his knee replacement.  His femoral and tibial components were well fixed.  His patellar component was well fixed.  I removed the polyinsert from the metal-backed patella.  Using an ACL saw blade, I disrupted the interface between the patella and the patellar component.  The patella was press-fit and was very well fixed.  I used a metal cutting bur to remove the patellar component.  There was no significant patellar bone loss.  The patellar remnant was intact without fracture.  The knee was then flexed.  Using an ACL saw blade, I disrupted the interface between the implant and cement mantle of the proximal tibia and distal femur.  I then used flexible osteotomes.  I remove the bicondylar tibial liner with a Cobb.  The femoral component was  removed with a bone tamp.  There was no significant bone adherent to the femoral component.  There was osteolysis of the lateral femoral condyle, and essentially only a thin cortical shell remained.  The medial condyle was intact.  I then remove the patellar implant with a bone tamp.   There was no significant bone loss.  The proximal tibia was intact.  I then removed the tibial cement mantle with osteotomes and back scratchers.  And extra medullary guide was used to freshen the proximal tibial cut.  Once I was satisfied that all of the cement had been removed, I sized the tibia to a size G/H polyliner.  The femur was sized to an 11.  I then performed a synovectomy of the posterior compartment.  The PCL was intact.  The knee was then irrigated with Irrisept followed by normal saline.  On the back table, cement was mixed with 2.4 g of tobramycin per pack.  I fashioned a small intramedullary dowel which was placed into the proximal tibia and then the polyliner was cemented to the proximal tibia.  The distal femoral component was then cemented onto the distal femur.  The tourniquet was then let down.  Axial compression was held until the cement fully cured.  All excess cement was cleared.  Hemostasis was achieved with Bovie electrocautery and the aqua mantis.  The knee was then brought through a range of motion and stability was found to be adequate.  A medium Hemovac drain was placed within the lateral gutter.  The arthrotomy was closed with #1 PDS and #1 strata fix.  Deep dermal layer was closed with 2-0 Monocryl.  Skin was reapproximated with staples.  A Prevena negative pressure incisional dressing was applied and suction was hooked up to 125 mmHg without leak.  Bulky dressing was then placed followed by a knee immobilizer.  The patient was extubated and taken to the PACU in stable condition.  Sponge, needle, and instrument counts were correct at the end of the case x2.  There were no known complications.  POSTOPERATIVE PLAN: Postoperatively, the patient be readmitted to the hospitalist.  I have already spoken with infectious disease, who recommends 2 g IV ceftriaxone every 24 hours.  They will plan to see the patient in the morning.  We will maintain the knee immobilizer for now.   Touchdown weightbearing right lower extremity.  Beginning tomorrow morning, he can resume Xarelto at 10 mg daily for 24 to 48 hours, and then resume home dose.  Patient will likely need PICC line placement for long-term IV antibiotics.  Upon discharge, the house [facility] will be exchanged for a portable  Prevena VAC. I will need to see him back within 7 days from discharge to remove his negative pressure dressing.

## 2021-05-26 NOTE — Progress Notes (Signed)
[place] for heparin IV Indication: Hx VTE  No Active Allergies   Patient Measurements: Height: '5\' 10"'$  (177.8 cm) Weight: 95.3 kg (210 lb 1.6 oz) IBW/kg (Calculated) : 73 Heparin Dosing Weight: 94 kg  Vital Signs: Temp: 98.6 F (37 C) (08/11 1241) Temp Source: Oral (08/11 1241) BP: 131/66 (08/11 1320) Pulse Rate: 58 (08/11 1320)  Labs: Recent Labs    05/24/21 1847 05/24/21 1850 05/25/21 0609 05/25/21 1815 05/26/21 0356  HGB 11.3*  --  10.6*  --  10.3*  HCT 35.8*  --  33.7*  --  32.3*  PLT 579*  --  480*  --  467*  APTT 42*  --  50* 66* 85*  LABPROT 16.8*  --   --   --   --   INR 1.4*  --   --   --   --   HEPARINUNFRC  --    < > 0.34 0.20* 0.36  CREATININE 1.00  --  0.85  --  0.84   < > = values in this interval not displayed.     Estimated Creatinine Clearance: 92.1 mL/min (by C-G formula based on SCr of 0.84 mg/dL).   Assessment: 39 yoM on Xarelto PTA for Hx DVT/PE, admitted directly from Ortho service for septic joint. Xarelto held for anticipated surgery, and Pharmacy consulted to dose heparin in the meantime.  Baseline INR, aPTT slightly elevated d/t prior Xarelto; Heparin level grossly elevated d/t same Prior anticoagulation: Xarelto 20 mg daily q supper; last dose 8/8 at 10pm  Significant events:  Today, 05/26/2021: Heparin level and aPTT both therapeutic this AM on 1600 units/hr Heparin stopped at 0600 this AM for resection arthroplasty and spacer placement in OR CBC: Hgb low but stable; Plt slightly elevated but improving SCr at baseline, WNL No bleeding reported  Goal of Therapy: Heparin level 0.3-0.7 units/ml Monitor platelets by anticoagulation protocol: Yes  Plan: F/u timing per Ortho to resume heparin infusion; would resume at 1600 units/hr Daily CBC, daily heparin level once stable Monitor for signs of bleeding or thrombosis  Reuel Boom, PharmD, BCPS [phone] 05/26/2021, 2:34 PM

## 2021-05-26 NOTE — Anesthesia Postprocedure Evaluation (Signed)
Anesthesia Post Note  Patient: Patrick Fritz  Procedure(s) Performed: EXCISIONAL TOTAL KNEE ARTHROPLASTY WITH ANTIBIOTIC SPACERS (Right: Knee)     Patient location during evaluation: PACU Anesthesia Type: General Level of consciousness: sedated and patient cooperative Pain management: pain level controlled Vital Signs Assessment: post-procedure vital signs reviewed and stable Respiratory status: spontaneous breathing Cardiovascular status: stable Anesthetic complications: no   No notable events documented.  Last Vitals:  Vitals:   05/26/21 1745 05/26/21 1802  BP: 135/66 129/67  Pulse: 64 63  Resp: 17 16  Temp:    SpO2: 100% 97%    Last Pain:  Vitals:   05/26/21 1822  TempSrc:   PainSc: [place]

## 2021-05-26 NOTE — Anesthesia Procedure Notes (Signed)
Procedure Name: Intubation Date/Time: 05/26/2021 1:42 PM Performed by: Milford Cage, CRNA Pre-anesthesia Checklist: Patient identified, Emergency Drugs available, Suction available and Patient being monitored Patient Re-evaluated:Patient Re-evaluated prior to induction Oxygen Delivery Method: Circle system utilized Preoxygenation: Pre-oxygenation with 100% oxygen Induction Type: IV induction Ventilation: Mask ventilation without difficulty and Oral airway inserted - appropriate to patient size Laryngoscope Size: Sabra Heck and 2 Grade View: Grade I Tube type: Oral Tube size: 7.5 mm Number of attempts: 1 Airway Equipment and Method: Stylet Placement Confirmation: ETT inserted through vocal cords under direct vision, positive ETCO2 and breath sounds checked- equal and bilateral Secured at: 23 cm Tube secured with: Tape Dental Injury: Teeth and Oropharynx as per pre-operative assessment

## 2021-05-26 NOTE — Plan of Care (Signed)
  Problem: Education: Goal: Knowledge of General Education information will improve Description: Including pain rating scale, medication(s)/side effects and non-pharmacologic comfort measures Outcome: Progressing   Problem: Health Behavior/Discharge Planning: Goal: Ability to manage health-related needs will improve Outcome: Progressing   Problem: Clinical Measurements: Goal: Ability to maintain clinical measurements within normal limits will improve Outcome: Progressing Goal: Will remain free from infection Outcome: Progressing Goal: Diagnostic test results will improve Outcome: Progressing Goal: Cardiovascular complication will be avoided Outcome: Progressing   Problem: Activity: Goal: Risk for activity intolerance will decrease Outcome: Progressing   Problem: Coping: Goal: Level of anxiety will decrease Outcome: Progressing   Problem: Pain Managment: Goal: General experience of comfort will improve Outcome: Progressing   Problem: Safety: Goal: Ability to remain free from injury will improve Outcome: Progressing   Problem: Skin Integrity: Goal: Risk for impaired skin integrity will decrease Outcome: Progressing   Problem: Clinical Measurements: Goal: Respiratory complications will improve Outcome: Not Applicable   Problem: Nutrition: Goal: Adequate nutrition will be maintained Outcome: Not Applicable   Problem: Elimination: Goal: Will not experience complications related to bowel motility Outcome: Not Applicable Goal: Will not experience complications related to urinary retention Outcome: Not Applicable

## 2021-05-26 NOTE — Progress Notes (Signed)
PROGRESS NOTE    MUSIQ VEST  [id number] DOB: 10-Jun-1951 DOA: 05/24/2021 PCP: Mayra Neer, MD   Brief Narrative: ESKEL BROTHERSON is a 70 y.o. male with PMH of bilateral TKA, systolic CHF, CAD/STEMI s/p CABG in 2017, PE/DVT on Xarelto, HTN, anxiety, PTSD, osteoarthritis/radiculopathy and OSA not on CPAP presenting with right knee pain and swelling.   Patient reports worsening of chronic right knee pain over the last 2 weeks.  He presented to ED on 7/29 and had an x-ray and venous Doppler that showed knee joint effusion with loose body in Baker's cyst.  He was discharged home to follow-up with orthopedic surgery.  He was seen at [facility] 6 days ago and had arthrocentesis.  He returned to [facility] for follow-up today, and directed to hospital with concern for septic arthritis.  Synovial fluid from [facility] with 79,000 nucleated cells with 93.5% neutrophils and GPC in pairs.    Patient has chills but no fever.  Right knee feels hot at times.  He rates his pain 9/10 when he was at [facility] but improved to 7/10 here without medication.  He also noted swelling and limited range of motion due to pain.  He denies numbness or tingling.  He denies chest pain at rest or with exertion, cough, nausea, vomiting or UTI symptoms other than nocturnal polyuria.  He reports "a little" shortness of breath unchanged from baseline.  Last dose on his Xarelto was the evening of 8/8.  He has bilateral TKA was 26 years ago.  He denies history of diabetes.  Patient stated that his penicillin allergy was when he was very young.  He reports using penicillin prophylactically during dental procedures. Patient lives with his wife.  Denies smoking cigarette.  Admits to social alcohol.  Denies recreational drug use.  Prefers to remain full code.   On arrival, vital signs within normal.  ALP 422.  WBC 10.4.  Hgb 11.3.  Platelet 579.  Otherwise, CBC and CMP without significant finding.  CRP elevated to 32.2.   Lactic acid 1.3.  INR 1.4.  APTT 42.  COVID-19 and influenza PCR nonreactive.  Blood cultures obtained.  Patient was started on IV vancomycin for septic arthritis of right knee and IV heparin for chronic PE/DVT.  Per patient, Dr. Lyla Glassing from [facility] to do arthroscopic washout of his right knee on 8/11.  Assessment & Plan:   Active Problems:   Septic arthritis of knee, right (HCC)  #1  Right knee septic arthritis culture done in the office growing st staph epidermidis antibiotics changed to Ancef.   OR today for right TKA resection and placement of antibiotic spacer  #2 history of chronic PE and DVT on Xarelto at home.  This has been on hold continue IV heparin perioperatively.  #3 history of CAD CABG and systolic CHF-continue home meds including Coreglosartan, Aldactone and Farxiga -Strict intake and output and daily weight -Be cautious with IV fluid perioperatively  #4HTN BP SOFT 112/68 .HOLD aldactone he is npo after mn.restart post op    Estimated body mass index is 30.15 kg/m as calculated from the following:   Height as of this encounter: '5\' 10"'$  (1.778 m).   Weight as of this encounter: 95.3 kg.  DVT prophylaxis: HEPARIN Code Status: FULL Family Communication: none at bed side Disposition Plan:  Status is: Inpatient  Remains inpatient appropriate because:IV treatments appropriate due to intensity of illness or inability to take PO  Dispo: The patient is from: Home  Anticipated d/c is to: Home              Patient currently is not medically stable to d/c.   Difficult to place patient No       Consultants:  ortho  Procedures: rocephin Antimicrobials:rocephin  Subjective: He is resting in bed he has no new complaints he is waiting anxiously for surgery today no overnight events Had a bowel movement Objective: Vitals:   05/25/21 1425 05/25/21 2147 05/26/21 0520 05/26/21 0730  BP: 112/68 129/73 109/71   Pulse: 61 63 67   Resp: '18 17 20   '$ Temp:  98.6 F (37 C) 99.7 F (37.6 C) 98.4 F (36.9 C)   TempSrc: Oral Oral Oral   SpO2: 99% 93% 94%   Weight:   95.1 kg 95.3 kg  Height:        Intake/Output Summary (Last 24 hours) at 05/26/2021 1021 Last data filed at 05/26/2021 0730 Gross per 24 hour  Intake --  Output 1300 ml  Net -1300 ml    Filed Weights   05/25/21 0452 05/26/21 0520 05/26/21 0730  Weight: 95.4 kg 95.1 kg 95.3 kg    Examination:  General exam: Appears calm and comfortable  Respiratory system: Clear to auscultation. Respiratory effort normal. Cardiovascular system: S1 & S2 heard, RRR. No JVD, murmurs, rubs, gallops or clicks. No pedal edema. Gastrointestinal system: Abdomen is nondistended, soft and nontender. No organomegaly or masses felt. Normal bowel sounds heard. Central nervous system: Alert and oriented. No focal neurological deficits. Extremities: right knee swollen  Skin: No rashes, lesions or ulcers Psychiatry: Judgement and insight appear normal. Mood & affect appropriate.     Data Reviewed: I have personally reviewed following labs and imaging studies  CBC: Recent Labs  Lab 05/24/21 1847 05/25/21 0609 05/26/21 0356  WBC 10.4 7.4 8.0  NEUTROABS 8.2*  --   --   HGB 11.3* 10.6* 10.3*  HCT 35.8* 33.7* 32.3*  MCV 93.0 93.1 92.6  PLT 579* 480* 467*    Basic Metabolic Panel: Recent Labs  Lab 05/24/21 1847 05/25/21 0609 05/26/21 0356  NA 138 139 136  K 4.4 4.7 4.2  CL 99 102 102  CO2 '26 27 25  '$ GLUCOSE 102* 104* 123*  BUN '16 18 17  '$ CREATININE 1.00 0.85 0.84  CALCIUM 9.1 8.8* 8.5*  MG 2.0 2.1  --   PHOS  --  3.9  --     GFR: Estimated Creatinine Clearance: 92.1 mL/min (by C-G formula based on SCr of 0.84 mg/dL). Liver Function Tests: Recent Labs  Lab 05/24/21 1847 05/25/21 0609 05/26/21 0356  AST 41 31 52*  ALT 42 33 41  ALKPHOS 422* 333* 350*  BILITOT 0.6 0.5 0.4  PROT 7.7 6.9 6.7  ALBUMIN 2.7* 2.4* 2.4*    No results for input(s): LIPASE, AMYLASE in the last 168  hours. No results for input(s): AMMONIA in the last 168 hours. Coagulation Profile: Recent Labs  Lab 05/24/21 1847  INR 1.4*    Cardiac Enzymes: No results for input(s): CKTOTAL, CKMB, CKMBINDEX, TROPONINI in the last 168 hours. BNP (last 3 results) No results for input(s): PROBNP in the last 8760 hours. HbA1C: Recent Labs    05/24/21 1847  HGBA1C 6.3*    CBG: No results for input(s): GLUCAP in the last 168 hours. Lipid Profile: No results for input(s): CHOL, HDL, LDLCALC, TRIG, CHOLHDL, LDLDIRECT in the last 72 hours. Thyroid Function Tests: No results for input(s): TSH, T4TOTAL, FREET4, T3FREE, THYROIDAB in the  last 72 hours. Anemia Panel: No results for input(s): VITAMINB12, FOLATE, FERRITIN, TIBC, IRON, RETICCTPCT in the last 72 hours. Sepsis Labs: Recent Labs  Lab 05/24/21 1918 05/24/21 2043  LATICACIDVEN 1.3 1.6     Recent Results (from the past 240 hour(s))  Culture, blood (routine x 2)     Status: None (Preliminary result)   Collection Time: 05/24/21  6:19 PM   Specimen: BLOOD  Result Value Ref Range Status   Specimen Description   Final    BLOOD LEFT ANTECUBITAL Performed at [facility], [place] [address] [place], [place] [address]    Special Requests   Final    Blood Culture adequate volume BOTTLES DRAWN AEROBIC AND ANAEROBIC Performed at [facility] [address] [place], [place] [address]    Culture   Final    NO GROWTH 2 DAYS Performed at [facility] [address] [place], [place] [address]    Report Status PENDING  Incomplete  Resp Panel by RT-PCR (Flu A&B, Covid) Nasopharyngeal Swab     Status: None   Collection Time: 05/24/21  6:35 PM   Specimen: Nasopharyngeal Swab; Nasopharyngeal(NP) swabs in vial transport medium  Result Value Ref Range Status   SARS Coronavirus 2 by RT PCR NEGATIVE NEGATIVE Final    Comment: (NOTE) SARS-CoV-2 target nucleic acids are NOT DETECTED.  The SARS-CoV-2 RNA  is generally detectable in upper respiratory specimens during the acute phase of infection. The lowest concentration of SARS-CoV-2 viral copies this assay can detect is 138 copies/mL. A negative result does not preclude SARS-Cov-2 infection and should not be used as the sole basis for treatment or other patient management decisions. A negative result may occur with  improper specimen collection/handling, submission of specimen other than nasopharyngeal swab, presence of viral mutation(s) within the areas targeted by this assay, and inadequate number of viral copies(<138 copies/mL). A negative result must be combined with clinical observations, patient history, and epidemiological information. The expected result is Negative.  Fact Sheet for Patients:  EntrepreneurPulse.com.au  Fact Sheet for Healthcare Providers:  IncredibleEmployment.be  This test is no t yet approved or cleared by the [place] FDA and  has been authorized for detection and/or diagnosis of SARS-CoV-2 by FDA under an Emergency Use Authorization (EUA). This EUA will remain  in effect (meaning this test can be used) for the duration of the COVID-19 declaration under Section 564(b)(1) of the Act, 21 U.S.C.section 360bbb-3(b)(1), unless the authorization is terminated  or revoked sooner.       Influenza A by PCR NEGATIVE NEGATIVE Final   Influenza B by PCR NEGATIVE NEGATIVE Final    Comment: (NOTE) The Xpert Xpress SARS-CoV-2/FLU/RSV plus assay is intended as an aid in the diagnosis of influenza from Nasopharyngeal swab specimens and should not be used as a sole basis for treatment. Nasal washings and aspirates are unacceptable for Xpert Xpress SARS-CoV-2/FLU/RSV testing.  Fact Sheet for Patients: EntrepreneurPulse.com.au  Fact Sheet for Healthcare Providers: IncredibleEmployment.be  This test is not yet approved or cleared by the  [place] FDA and has been authorized for detection and/or diagnosis of SARS-CoV-2 by FDA under an Emergency Use Authorization (EUA). This EUA will remain in effect (meaning this test can be used) for the duration of the COVID-19 declaration under Section 564(b)(1) of the Act, 21 U.S.C. section 360bbb-3(b)(1), unless the authorization is terminated or revoked.  Performed at [facility], [place] [address], [place], [place] [address]   Culture,  blood (routine x 2)     Status: Abnormal (Preliminary result)   Collection Time: 05/24/21  6:47 PM   Specimen: BLOOD  Result Value Ref Range Status   Specimen Description   Final    BLOOD LEFT ANTECUBITAL Performed at [facility] [address], [place], [place] [address]    Special Requests   Final    BOTTLES DRAWN AEROBIC AND ANAEROBIC Blood Culture adequate volume Performed at [facility] [address], [place], [place] [address]    Culture  Setup Time   Final    GRAM POSITIVE COCCI IN CLUSTERS AEROBIC BOTTLE ONLY Organism ID to follow CRITICAL RESULT CALLED TO, READ BACK BY AND VERIFIED WITH: Shelda Jakes PHARMD [id number] 05/25/21 A BROWNING    Culture (A)  Final    STAPHYLOCOCCUS EPIDERMIDIS THE SIGNIFICANCE OF ISOLATING THIS ORGANISM FROM A SINGLE SET OF BLOOD CULTURES WHEN MULTIPLE SETS ARE DRAWN IS UNCERTAIN. PLEASE NOTIFY THE MICROBIOLOGY DEPARTMENT WITHIN ONE WEEK IF SPECIATION AND SENSITIVITIES ARE REQUIRED. Performed at [facility], [place] [address] [place], [place] [address]    Report Status PENDING  Incomplete  Blood Culture ID Panel (Reflexed)     Status: Abnormal   Collection Time: 05/24/21  6:47 PM  Result Value Ref Range Status   Enterococcus faecalis NOT DETECTED NOT DETECTED Final   Enterococcus Faecium NOT DETECTED NOT DETECTED Final   Listeria monocytogenes NOT DETECTED NOT DETECTED Final   Staphylococcus species DETECTED (A) NOT DETECTED Final    Comment:  CRITICAL RESULT CALLED TO, READ BACK BY AND VERIFIED WITH: Shelda Jakes PHARMD [id number] 05/25/21 A BROWNING    Staphylococcus aureus (BCID) NOT DETECTED NOT DETECTED Final   Staphylococcus epidermidis DETECTED (A) NOT DETECTED Final    Comment: CRITICAL RESULT CALLED TO, READ BACK BY AND VERIFIED WITH: Shelda Jakes PHARMD [id number] 05/25/21 A BROWNING    Staphylococcus lugdunensis NOT DETECTED NOT DETECTED Final   Streptococcus species NOT DETECTED NOT DETECTED Final   Streptococcus agalactiae NOT DETECTED NOT DETECTED Final   Streptococcus pneumoniae NOT DETECTED NOT DETECTED Final   Streptococcus pyogenes NOT DETECTED NOT DETECTED Final   A.calcoaceticus-baumannii NOT DETECTED NOT DETECTED Final   Bacteroides fragilis NOT DETECTED NOT DETECTED Final   Enterobacterales NOT DETECTED NOT DETECTED Final   Enterobacter cloacae complex NOT DETECTED NOT DETECTED Final   Escherichia coli NOT DETECTED NOT DETECTED Final   Klebsiella aerogenes NOT DETECTED NOT DETECTED Final   Klebsiella oxytoca NOT DETECTED NOT DETECTED Final   Klebsiella pneumoniae NOT DETECTED NOT DETECTED Final   Proteus species NOT DETECTED NOT DETECTED Final   Salmonella species NOT DETECTED NOT DETECTED Final   Serratia marcescens NOT DETECTED NOT DETECTED Final   Haemophilus influenzae NOT DETECTED NOT DETECTED Final   Neisseria meningitidis NOT DETECTED NOT DETECTED Final   Pseudomonas aeruginosa NOT DETECTED NOT DETECTED Final   Stenotrophomonas maltophilia NOT DETECTED NOT DETECTED Final   Candida albicans NOT DETECTED NOT DETECTED Final   Candida auris NOT DETECTED NOT DETECTED Final   Candida glabrata NOT DETECTED NOT DETECTED Final   Candida krusei NOT DETECTED NOT DETECTED Final   Candida parapsilosis NOT DETECTED NOT DETECTED Final   Candida tropicalis NOT DETECTED NOT DETECTED Final   Cryptococcus neoformans/gattii NOT DETECTED NOT DETECTED Final   Methicillin resistance mecA/C NOT DETECTED NOT DETECTED Final    Comment:  Performed at [facility], [address] [place], [place] [address]  Radiology Studies: No results found.      Scheduled Meds:  allopurinol  300 mg Oral Daily   carvedilol  3.125 mg Oral BID   chlorhexidine  60 mL Topical Once   dapagliflozin propanediol  10 mg Oral QAC breakfast   docusate sodium  100 mg Oral BID   gabapentin  100 mg Oral BID   losartan  25 mg Oral Daily   povidone-iodine  2 application Topical Once   sertraline  150 mg Oral Daily   simvastatin  40 mg Oral QHS   Continuous Infusions:  sodium chloride 75 mL/hr at 05/26/21 0914    ceFAZolin (ANCEF) IV     tranexamic acid     vancomycin       LOS: 2 days    Time spent: 45 min    Georgette Shell, MD  05/26/2021, 10:21 AM

## 2021-05-26 NOTE — Consult Note (Signed)
ORTHOPAEDIC CONSULTATION  REQUESTING PHYSICIAN: Georgette Shell, MD  PCP:  Mayra Neer, MD  Chief Complaint: Periprosthetic joint infection right knee  HPI: Patrick Fritz is a 70 y.o. male who underwent remote bilateral total knee arthroplasty.  He was doing well until about 2 weeks ago when he developed right knee pain and swelling.  Last week, he was seen at our office and his right knee was aspirated.  Synovial fluid white blood cell count was 79,000 with 94% neutrophils.  Neutrophil elastase positive.  Alpha defensin positive.  Culture is growing Streptococcus anginosis that is pan susceptible.  He was admitted to the hospitalist service for perioperative risk stratification.  He takes anticoagulation for multiple pulmonary embolisms.  He has been on a heparin drip since admission, and this was held 6 hours preoperatively.  He denies left knee pain and swelling.  Past Medical History:  Diagnosis Date   DJD (degenerative joint disease), lumbar    DVT (deep venous thrombosis) (HCC)    Family history of adverse reaction to anesthesia    Glaucoma    R eye diminished vision approx 50%   High cholesterol    Hyperlipidemia    Hypertension    IFG (impaired fasting glucose)    OSA (obstructive sleep apnea)    Severe w AHI 34/hr on HST no on BiPAP at 19/15cm H2O   PE (pulmonary embolism)    Second occurance,enknown etiology,lifelong anticoagulation   Past Surgical History:  Procedure Laterality Date   CARDIAC CATHETERIZATION N/A 05/14/2016   Procedure: Left Heart Cath and Coronary Angiography;  Surgeon: Belva Crome, MD;  Location: [facility];  Service: Cardiovascular;  Laterality: N/A;   CORONARY ARTERY BYPASS GRAFT N/A 05/14/2016   Procedure: CORONARY ARTERY BYPASS GRAFTING (CABG) times two with LIMA to LAD and right leg SVG to PDA,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;  Surgeon: Ivin Poot, MD;  Location: [facility];  Service: Open Heart Surgery;   Laterality: N/A;   KNEE SURGERY Bilateral    arthroscopic   TOTAL KNEE ARTHROPLASTY Bilateral 1996   Social History   Socioeconomic History   Marital status: Married    Spouse name: Not on file   Number of children: Not on file   Years of education: Not on file   Highest education level: Not on file  Occupational History   Not on file  Tobacco Use   Smoking status: Never   Smokeless tobacco: Never  Vaping Use   Vaping Use: Never used  Substance and Sexual Activity   Alcohol use: Yes    Comment: 1-2 per week   Drug use: Never   Sexual activity: Not on file  Other Topics Concern   Not on file  Social History Narrative   Not on file   Social Determinants of Health   Financial Resource Strain: Not on file  Food Insecurity: Not on file  Transportation Needs: Not on file  Physical Activity: Not on file  Stress: Not on file  Social Connections: Not on file   Family History  Problem Relation Age of Onset   Osteoporosis Mother    Hypertension Father    CAD Father    No Active Allergies Prior to Admission medications   Medication Sig Start Date End Date Taking? Authorizing Provider  acetaminophen (TYLENOL) 325 MG tablet Take 650 mg by mouth every 6 (six) hours as needed for moderate pain.   Yes [provider]  allopurinol (ZYLOPRIM) 300  MG tablet Take 300 mg by mouth daily.    Yes [provider]  carvedilol (COREG) 3.125 MG tablet Take 1 tablet (3.125 mg total) by mouth 2 (two) times daily. 01/27/21  Yes Belva Crome, MD  dapagliflozin propanediol (FARXIGA) 10 MG TABS tablet Take 1 tablet (10 mg total) by mouth daily before breakfast. 03/21/21  Yes Belva Crome, MD  diclofenac Sodium (VOLTAREN) 1 % GEL Apply 1 application topically daily as needed for pain.   Yes [provider]  diphenhydramine-acetaminophen (TYLENOL PM) 25-500 MG TABS tablet Take 2 tablets by mouth at bedtime as needed (pain).   Yes [provider]  gabapentin  (NEURONTIN) 100 MG capsule Take 100 mg by mouth 2 (two) times daily.   Yes [provider]  losartan (COZAAR) 50 MG tablet Take 25 mg by mouth daily.   Yes [provider]  rivaroxaban (XARELTO) 20 MG TABS tablet Take 1 tablet (20 mg total) by mouth daily with supper. 06/09/16  Yes Isaiah Serge, NP  sertraline (ZOLOFT) 100 MG tablet Take 150 mg by mouth daily. 03/24/21  Yes [provider]  simvastatin (ZOCOR) 40 MG tablet Take 1 tablet (40 mg total) by mouth at bedtime. 08/20/20  Yes Belva Crome, MD  spironolactone (ALDACTONE) 25 MG tablet Take 0.5 tablets (12.5 mg total) by mouth daily. 01/27/21  Yes Belva Crome, MD  traMADol (ULTRAM) 50 MG tablet Take 50-100 mg by mouth at bedtime. 06/23/16  Yes [provider]   No results found.  ESR 139 CRP 32.2  Positive ROS: All other systems have been reviewed and were otherwise negative with the exception of those mentioned in the HPI and as above.  Physical Exam: General: Alert, no acute distress Cardiovascular: No pedal edema Respiratory: No cyanosis, no use of accessory musculature GI: No organomegaly, abdomen is soft and non-tender Skin: No lesions in the area of chief complaint Neurologic: Sensation intact distally Psychiatric: Patient is competent for consent with normal mood and affect Lymphatic: No axillary or cervical lymphadenopathy  MUSCULOSKELETAL: Examination of the right knee reveals a healed anterior incision.  He has an effusion.  He has limited range of motion secondary to pain.  He is neurovascularly intact.  Assessment: Right knee periprosthetic joint infection  Plan: I discussed the findings with the patient and his wife.  We went over his lab results.  We discussed that the gold standard treatment for this situation is a two-stage procedure.  We discussed the risk, benefits, and alternatives to surgery. We will plan for resection arthroplasty with placement of articulating versus  static spacer today.  Postoperatively, he will need infectious disease consult for antibiotic selection and duration.  All questions were solicited and answered.  Bertram Savin, MD [phone]    05/26/2021 1:15 PM

## 2021-05-26 NOTE — Progress Notes (Signed)
Assisted Dr. Germeroth with right, ultrasound guided, adductor canal block. Side rails up, monitors on throughout procedure. See vital signs in flow sheet. Tolerated Procedure well. 

## 2021-05-26 NOTE — Transfer of Care (Signed)
Immediate Anesthesia Transfer of Care Note  Patient: Patrick Fritz  Procedure(s) Performed: Procedure(s): EXCISIONAL TOTAL KNEE ARTHROPLASTY WITH ANTIBIOTIC SPACERS (Right)  Patient Location: PACU  Anesthesia Type:General  Level of Consciousness: Alert, Awake, Oriented  Airway & Oxygen Therapy: Patient Spontanous Breathing  Post-op Assessment: Report given to RN  Post vital signs: Reviewed and stable  Last Vitals:  Vitals:   05/26/21 1315 05/26/21 1320  BP: (!) 145/80 131/66  Pulse: (!) 59 (!) 58  Resp: 11 15  Temp:    SpO2: 123456 [id number]    Complications: No apparent anesthesia complications

## 2021-05-26 NOTE — Progress Notes (Signed)
Initial Nutrition Assessment  DOCUMENTATION CODES:  Not applicable  INTERVENTION:  Advance diet as medically able.  Add Ensure Plus po BID, each supplement provides 350 kcal and 13 grams of protein.   Add Magic cup TID with meals, each supplement provides 290 kcal and 9 grams of protein.  Add MVI with minerals daily.  NUTRITION DIAGNOSIS:  Increased nutrient needs related to acute illness (septic arthritis) as evidenced by estimated needs.  GOAL:  Patient will meet greater than or equal to 90% of their needs  MONITOR:  Diet advancement, PO intake, Supplement acceptance, Labs, Weight trends, I & O's  REASON FOR ASSESSMENT:  Malnutrition Screening Tool    ASSESSMENT:  69 yo male with a PMH of bilateral TKA, systolic CHF, CAD/STEMI s/p CABG in 2017, PE/DVT, HTN, anxiety, PTSD, osteoarthritis/radiculopathy, and OSA who presents with septic arthritis of R knee.  Spoke with pt at bedside. Pt reports eating well at home with no changes in what he is eating or changes in appetite. He is not doing anything different.  Pt reports a 15 lb weight loss in 2 days. Per Epic, pt has lost ~11.5 lbs (5.2%) in the last 2 weeks, which is significant and severe for the time frame. This is likely a difference in scales.  On exam, pt with no significant depletions.  Recommend adding Ensure Plus BID and Magic Cup TID, as well as MVI with minerals daily.  Medications: reviewed; colace BID, NaCl @ 75 ml/hr via IV, Ancef TID via IV  Labs: reviewed; Glucose 123 (H)  NUTRITION - FOCUSED PHYSICAL EXAM: Flowsheet Row Most Recent Value  Orbital Region Mild depletion  Upper Arm Region No depletion  Thoracic and Lumbar Region No depletion  Buccal Region No depletion  Temple Region No depletion  Clavicle Bone Region No depletion  Clavicle and Acromion Bone Region No depletion  Scapular Bone Region No depletion  Dorsal Hand No depletion  Patellar Region No depletion  Anterior Thigh Region No  depletion  Posterior Calf Region No depletion  Edema (RD Assessment) None  Hair Reviewed  Eyes Reviewed  Mouth Reviewed  Skin Reviewed  Nails Reviewed   Diet Order:   Diet Order             Diet NPO time specified Except for: Sips with Meds  Diet effective midnight                  EDUCATION NEEDS:  Education needs have been addressed  Skin:  Skin Assessment: Skin Integrity Issues: Skin Integrity Issues:: Other (Comment) Other: Non-pressure wounds on L upper arm and L elbow  Last BM:  05/24/21  Height:  Ht Readings from Last 1 Encounters:  05/24/21 '5\' 10"'$  (1.778 m)   Weight:  Wt Readings from Last 1 Encounters:  05/26/21 95.3 kg   BMI:  Body mass index is 30.15 kg/m.  Estimated Nutritional Needs:  Kcal:  2100-2300 Protein:  105-120 grams Fluid:  >2.1 L  Derrel Nip, RD, LDN (she/her/hers) Registered Dietitian I After-Hours/Weekend Pager # in [place]

## 2021-05-26 NOTE — Anesthesia Preprocedure Evaluation (Addendum)
Anesthesia Evaluation  Patient identified by MRN, date of birth, ID band Patient awake    Reviewed: Allergy & Precautions, NPO status , Patient's Chart, lab work & pertinent test results, reviewed documented beta blocker date and time   Airway Mallampati: II  TM Distance: >3 FB Neck ROM: Full    Dental  (+) Dental Advisory Given, Teeth Intact   Pulmonary sleep apnea ,    Pulmonary exam normal breath sounds clear to auscultation       Cardiovascular hypertension, Pt. on home beta blockers and Pt. on medications + CAD, + CABG and +CHF  Normal cardiovascular exam+ Valvular Problems/Murmurs AI  Rhythm:Regular Rate:Normal  Echo 01/2021 1. Septal and inferior wall hypokinesis . Left ventricular ejection fraction, by estimation, is 30 to 35%. The left ventricle has moderately decreased function. The left ventricle demonstrates regional wall motion abnormalities (see scoring diagram/findings for description). The left ventricular internal cavity size was mildly dilated. Left ventricular diastolic parameters were normal.  2. Right ventricular systolic function is normal. The right ventricular size is normal. There is normal pulmonary artery systolic pressure.  3. Left atrial size was moderately dilated.  4. The mitral valve is abnormal. Mild mitral valve regurgitation. No evidence of mitral stenosis.  5. The aortic valve is tricuspid. There is moderate calcification of the aortic valve. Aortic valve regurgitation is mild. Mild aortic valve sclerosis is present, with no evidence of aortic valve stenosis.  6. The inferior vena cava is normal in size with greater than 50% respiratory variability, suggesting right atrial pressure of 3 mmHg.    Neuro/Psych TIA   GI/Hepatic negative GI ROS, Neg liver ROS,   Endo/Other  negative endocrine ROS  Renal/GU negative Renal ROS     Musculoskeletal  (+) Arthritis ,   Abdominal (+) + obese,    Peds  Hematology negative hematology ROS (+)   Anesthesia Other Findings   Reproductive/Obstetrics                            Anesthesia Physical Anesthesia Plan  ASA: 3  Anesthesia Plan: General   Post-op Pain Management: GA combined w/ Regional for post-op pain   Induction: Intravenous  PONV Risk Score and Plan: 3 and Ondansetron, Dexamethasone, Treatment may vary due to age or medical condition, Diphenhydramine and Midazolam  Airway Management Planned: Oral ETT  Additional Equipment: None  Intra-op Plan:   Post-operative Plan: Extubation in OR  Informed Consent: I have reviewed the patients History and Physical, chart, labs and discussed the procedure including the risks, benefits and alternatives for the proposed anesthesia with the patient or authorized representative who has indicated his/her understanding and acceptance.     Dental advisory given  Plan Discussed with: CRNA  Anesthesia Plan Comments:        Anesthesia Quick Evaluation

## 2021-05-26 NOTE — Anesthesia Procedure Notes (Signed)
Anesthesia Regional Block: Adductor canal block   Pre-Anesthetic Checklist: , timeout performed,  Correct Patient, Correct Site, Correct Laterality,  Correct Procedure, Correct Position, site marked,  Risks and benefits discussed,  Surgical consent,  Pre-op evaluation,  At surgeon's request and post-op pain management  Laterality: Lower and Right  Prep: chloraprep       Needles:  Injection technique: Single-shot  Needle Type: Stimiplex     Needle Length: 9cm  Needle Gauge: 21     Additional Needles:   Procedures:,,,, ultrasound used (permanent image in chart),,    Narrative:  Start time: 05/26/2021 1:00 PM End time: 05/26/2021 1:18 PM Injection made incrementally with aspirations every 5 mL.  Performed by: Personally  Anesthesiologist: Nolon Nations, MD  Additional Notes: BP cuff, EKG monitors applied. Sedation begun. Artery and nerve location verified with ultrasound. Anesthetic injected incrementally (58m), slowly, and after negative aspirations under direct u/s guidance. Good fascial/perineural spread. Tolerated well.

## 2021-05-27 ENCOUNTER — Inpatient Hospital Stay: Payer: Self-pay

## 2021-05-27 DIAGNOSIS — M00261 Other streptococcal arthritis, right knee: Secondary | ICD-10-CM

## 2021-05-27 LAB — BASIC METABOLIC PANEL
Anion gap: 9 (ref 5–15)
BUN: 18 mg/dL (ref 8–23)
CO2: 24 mmol/L (ref 22–32)
Calcium: 8.6 mg/dL — ABNORMAL LOW (ref 8.9–10.3)
Chloride: 102 mmol/L (ref 98–111)
Creatinine, Ser: 0.86 mg/dL (ref 0.61–1.24)
GFR, Estimated: >60 mL/min (ref >60)
Glucose, Bld: 146 mg/dL — ABNORMAL HIGH (ref 70–99)
Potassium: 4.7 mmol/L (ref 3.5–5.1)
Sodium: 135 mmol/L (ref 135–145)

## 2021-05-27 LAB — CBC
HCT: 32.2 % — ABNORMAL LOW (ref 39.0–52.0)
Hemoglobin: 10.4 g/dL — ABNORMAL LOW (ref 13.0–17.0)
MCH: 29.8 pg (ref 26.0–34.0)
MCHC: 32.3 g/dL (ref 30.0–36.0)
MCV: 92.3 fL (ref 80.0–100.0)
Platelets: 427 10*3/uL — ABNORMAL HIGH (ref 150–400)
RBC: 3.49 MIL/uL — ABNORMAL LOW (ref 4.22–5.81)
RDW: 13.2 % (ref 11.5–15.5)
WBC: 13.1 10*3/uL — ABNORMAL HIGH (ref 4.0–10.5)
nRBC: 0 % (ref 0.0–0.2)

## 2021-05-27 LAB — CULTURE, BLOOD (ROUTINE X 2): Special Requests: ADEQUATE

## 2021-05-27 MED ORDER — SODIUM CHLORIDE 0.9 % IV SOLN
2.0000 g | INTRAVENOUS | Status: DC
  Administered 2021-05-27 – 2021-05-29 (×3): 2 g via INTRAVENOUS
  Filled 2021-05-27: qty 2
  Filled 2021-05-27: qty 20
  Filled 2021-05-27: qty 2

## 2021-05-27 MED ORDER — RIVAROXABAN 20 MG PO TABS
20.0000 mg | ORAL_TABLET | Freq: Every day | ORAL | Status: DC
  Administered 2021-05-27 – 2021-05-29 (×3): 20 mg via ORAL
  Filled 2021-05-27 (×3): qty 1

## 2021-05-27 MED ORDER — HYDROCODONE-ACETAMINOPHEN 7.5-325 MG PO TABS: 1.0000 | ORAL_TABLET | ORAL | 0 refills | Status: DC | PRN

## 2021-05-27 NOTE — Evaluation (Signed)
Physical Therapy Evaluation Patient Details Name: Patrick Fritz MRN: [id number] DOB: 22-May-1951 Today's Date: 05/27/2021   History of Present Illness  Patient is 70 y.o. male s/p Rt TKA resection and antibiotic spacer placement on 05/26/21 due to Septic TKA. Patient is TDWB on Rt LE. PMH significant for OA, HTN, HLD, OSA, glaucoma, DVT, PE, Bil TKA in 1996, CABG in 2017.   Clinical Impression  Patrick Fritz is a 70 y.o. male POD 1 s/p Rt TKR (antibiotic spacer). Patient reports independence with mobility at baseline. Patient is now limited by functional impairments (see PT problem list below) and requires min assist and cues to maintain precautions with transfers and gait with RW. Patient was able to ambulate ~7 feet with RW and min assist and was limited by dizziness. HR in 80's. Patient instructed in exercise to facilitate circulation to manage edema and reduce risk of DVTs. Patient will benefit from continued skilled PT interventions to address impairments and progress towards PLOF. Acute PT will follow to progress mobility and stair training in preparation for safe discharge home.     Follow Up Recommendations Follow surgeon's recommendation for DC plan and follow-up therapies    Equipment Recommendations  Rolling walker with 5" wheels;3in1 (PT)    Recommendations for Other Services       Precautions / Restrictions Precautions Precautions: Fall Required Braces or Orthoses: Knee Immobilizer - Right Knee Immobilizer - Right: On at all times Restrictions Weight Bearing Restrictions: Yes RLE Weight Bearing: Touchdown weight bearing      Mobility  Bed Mobility Overal bed mobility: Needs Assistance Bed Mobility: Supine to Sit     Supine to sit: Min assist;HOB elevated     General bed mobility comments: cues for use of bed rail, assist for Rt LE and line management    Transfers Overall transfer level: Needs assistance Equipment used: Rolling walker (2  wheeled) Transfers: Sit to/from Stand Sit to Stand: Min assist;From elevated surface         General transfer comment: bed slightly elevated, cues for TDWB status on Rt LE and pt maintained throughout power up to walker. steady in standing.  Ambulation/Gait Ambulation/Gait assistance: Min assist Gait Distance (Feet): 7 Feet Assistive device: Rolling walker (2 wheeled) Gait Pattern/deviations: Step-to pattern;Decreased stride length;Decreased weight shift to right Gait velocity: decr   General Gait Details: pt with good use of UE strength and performing NWB for the most part resting great toe on floor when stopping. Pt limited by dizziness in standing.  Stairs            Wheelchair Mobility    Modified Rankin (Stroke Patients Only)       Balance                                             Pertinent Vitals/Pain Pain Assessment: 0-10 Pain Score: 4  Pain Location: Rt knee Pain Descriptors / Indicators: Aching;Discomfort Pain Intervention(s): Limited activity within patient's tolerance;Monitored during session;Repositioned    Home Living Family/patient expects to be discharged to:: Private residence Living Arrangements: Spouse/significant other Available Help at Discharge: Family Type of Home: House Home Access: Stairs to enter Entrance Stairs-Rails: None Entrance Stairs-Number of Steps: 2+2 Home Layout: One level Home Equipment: Environmental consultant - 2 wheels;Bedside commode      Prior Function Level of Independence: Independent  Hand Dominance   Dominant Hand: Right    Extremity/Trunk Assessment   Upper Extremity Assessment Upper Extremity Assessment: Overall WFL for tasks assessed    Lower Extremity Assessment Lower Extremity Assessment: Overall WFL for tasks assessed;RLE deficits/detail RLE: Unable to fully assess due to immobilization RLE Sensation: WNL RLE Coordination: WNL    Cervical / Trunk Assessment Cervical /  Trunk Assessment: Normal  Communication   Communication: No difficulties  Cognition Arousal/Alertness: Awake/alert Behavior During Therapy: WFL for tasks assessed/performed Overall Cognitive Status: Within Functional Limits for tasks assessed                                        General Comments      Exercises Total Joint Exercises Ankle Circles/Pumps: AROM;Both;20 reps;Seated   Assessment/Plan    PT Assessment Patient needs continued PT services  PT Problem List Decreased strength;Decreased range of motion;Decreased activity tolerance;Decreased balance;Decreased mobility;Decreased knowledge of use of DME;Decreased knowledge of precautions;Pain       PT Treatment Interventions DME instruction;Gait training;Stair training;Functional mobility training;Therapeutic activities;Therapeutic exercise;Balance training;Patient/family education    PT Goals (Current goals can be found in the Care Plan section)  Acute Rehab PT Goals Patient Stated Goal: get back independence PT Goal Formulation: With patient Time For Goal Achievement: 06/03/21 Potential to Achieve Goals: Good    Frequency Min 5X/week   Barriers to discharge        Co-evaluation               AM-PAC PT "6 Clicks" Mobility  Outcome Measure Help needed turning from your back to your side while in a flat bed without using bedrails?: A Little Help needed moving from lying on your back to sitting on the side of a flat bed without using bedrails?: A Little Help needed moving to and from a bed to a chair (including a wheelchair)?: A Little Help needed standing up from a chair using your arms (e.g., wheelchair or bedside chair)?: A Little Help needed to walk in hospital room?: A Little Help needed climbing 3-5 steps with a railing? : A Lot 6 Click Score: 17    End of Session Equipment Utilized During Treatment: Gait belt;Right knee immobilizer Activity Tolerance: Patient tolerated treatment  well Patient left: in chair;with call bell/phone within reach;with chair alarm set;with family/visitor present Nurse Communication: Mobility status;Weight bearing status PT Visit Diagnosis: Muscle weakness (generalized) (M62.81);Difficulty in walking, not elsewhere classified (R26.2);Pain Pain - Right/Left: Right Pain - part of body: Knee    Time: [id number] PT Time Calculation (min) (ACUTE ONLY): 26 min   Charges:   PT Evaluation $PT Eval Low Complexity: 1 Low PT Treatments $Therapeutic Activity: 8-22 mins        Verner Mould, DPT Acute Rehabilitation Services Office [phone] Pager [phone]   Jacques Navy 05/27/2021, 6:41 PM

## 2021-05-27 NOTE — Progress Notes (Signed)
[facility] for heparin IV>>xarelto Indication: Hx VTE  No Active Allergies   Patient Measurements: Height: '5\' 10"'$  (177.8 cm) Weight: 95.3 kg (210 lb 1.6 oz) IBW/kg (Calculated) : 73 Heparin Dosing Weight: 94 kg  Vital Signs: Temp: 98.2 F (36.8 C) (08/12 0647) Temp Source: Oral (08/12 0647) BP: 117/67 (08/12 0647) Pulse Rate: 68 (08/12 0647)  Labs: Recent Labs    05/24/21 1847 05/24/21 1850 05/25/21 0609 05/25/21 1815 05/26/21 0356 05/27/21 0425  HGB 11.3*  --  10.6*  --  10.3* 10.4*  HCT 35.8*  --  33.7*  --  32.3* 32.2*  PLT 579*  --  480*  --  467* 427*  APTT 42*  --  50* 66* 85*  --   LABPROT 16.8*  --   --   --   --   --   INR 1.4*  --   --   --   --   --   HEPARINUNFRC  --    < > 0.34 0.20* 0.36  --   CREATININE 1.00  --  0.85  --  0.84 0.86   < > = values in this interval not displayed.     Estimated Creatinine Clearance: 89.9 mL/min (by C-G formula based on SCr of 0.86 mg/dL).   Assessment: 31 yoM on Xarelto PTA for Hx DVT/PE, admitted directly from Ortho service for septic joint. Xarelto held for anticipated surgery, and Pharmacy consulted to dose heparin in the meantime.  Baseline INR, aPTT slightly elevated d/t prior Xarelto; Heparin level grossly elevated d/t same Prior anticoagulation: Xarelto 20 mg daily q supper; last dose 8/8 at 10pm  Significant events:  Today, 05/27/2021: 8/11 s/p arthroplasty and spacer placement in OR CBC: Hgb 10.4 low but stable; Plt remains slightly elevated  SCr at baseline, WNL No bleeding reported  Goal of Therapy: Monitor platelets by anticoagulation protocol: Yes  Plan: Resume PTA Xarelto 20 mg q supper  Eudelia Bunch, Pharm.D 05/27/2021 7:16 AM

## 2021-05-27 NOTE — Progress Notes (Signed)
PHARMACY CONSULT NOTE FOR:  OUTPATIENT  PARENTERAL ANTIBIOTIC THERAPY (OPAT)  Indication: Septic Joint Regimen: ceftriaxone 2g IV q24h End date: 06/24/2021   IV antibiotic discharge orders are pended. To discharging provider:  please sign these orders via discharge navigator,  Select New Orders & click on the button choice - Manage This Unsigned Work.     Thank you for allowing pharmacy to be a part of this patient's care.  Phillis Haggis 05/27/2021, 1:20 PM

## 2021-05-27 NOTE — Consult Note (Signed)
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  Status post resection arthroplasty with antibiotic spacer 05/26/2021.  Baseline ESR 139, CRP 32.2. Staph epidermis positive blood culture: Likely a contaminant  PLAN:    Continue ceftriaxone 2 g daily IV x4 weeks. Place PICC line See OP AT note below.  We will arrange for follow-up in our clinic prior to end of antibiotic therapy to determine if he needs prolongation of his IV therapy versus transition to p.o. Will sign off, please call as needed  Diagnosis: Right knee PJI  Culture Result: Strep anginosis  No Active Allergies  OPAT Orders Discharge antibiotics to be given via PICC line Discharge antibiotics: Per pharmacy protocol ceftriaxone 2 gm daily  Duration: 4 weeks End Date: 06/23/21  [facility] Care Per Protocol:  Home health RN for IV administration and teaching; PICC line care and labs.    Labs weekly while on IV antibiotics: _x_ CBC with differential __ BMP _x_ CMP _x_ CRP _x_ ESR __ Vancomycin trough __ CK  _x_ Please pull PIC at completion of IV antibiotics __ Please leave PIC in place until doctor has seen patient or been notified  Fax weekly labs to [phone]  Clinic Follow Up Appt: 06/15/21 @ 230pm with Dr Juleen China     Active Problems:   Septic arthritis of knee, right ([place])   MEDICATIONS:    Scheduled Meds: . allopurinol  300 mg Oral Daily  . dapagliflozin propanediol  10 mg Oral QAC breakfast  . docusate sodium  100 mg Oral BID  . feeding supplement  237 mL Oral BID BM  . gabapentin  100 mg Oral BID  . losartan  25 mg Oral Daily  . rivaroxaban  20 mg Oral Q supper  . senna  1 tablet Oral BID  . sertraline  150 mg Oral Daily  . simvastatin  40 mg Oral QHS   Continuous  Infusions: . sodium chloride Stopped (05/26/21 1200)  . cefTRIAXone (ROCEPHIN)  IV    . methocarbamol (ROBAXIN) IV     PRN Meds:.acetaminophen, diphenhydrAMINE, EPINEPHrine, HYDROcodone-acetaminophen, HYDROcodone-acetaminophen, methocarbamol **OR** methocarbamol (ROBAXIN) IV, metoCLOPramide **OR** metoCLOPramide (REGLAN) injection, morphine injection, ondansetron **OR** ondansetron (ZOFRAN) IV  HPI:    Patrick Fritz is a 70 y.o. male with past medical history significant for bilateral TKA, systolic heart failure, CAD status post CABG 2017, PE/DVT on Xarelto, hypertension, anxiety, arthritis, OSA, who presented with right knee pain and swelling due to prosthetic joint infection.  Patient has done well with his bilateral knee replacements until recently when he developed right knee pain and swelling.  He had no fevers or chills.  He was seen in the outpatient clinic where his right knee was aspirated.  Synovial fluid culture was significant for 79,000 white cells with 94% neutrophils.  Cultures grew Streptococcus anginosus with pan susceptibility.  He was admitted to the hospital for perioperative risk stratification.  Yesterday he underwent resection of his right total knee arthroplasty and placement of an antibiotic spacer.  We have been consulted for further antibiotic recommendations and he has been started on ceftriaxone 2 g daily.  Of note, 1 out of 4 blood culture bottles is positive for Staphylococcus epidermis which is likely a contaminant.   Past Medical History:  Diagnosis Date  . DJD (degenerative joint disease), lumbar   . DVT (deep venous thrombosis) ([place])   . Family history of adverse reaction to anesthesia   . Glaucoma    R eye diminished vision approx 50%  . High cholesterol   . Hyperlipidemia   . Hypertension   . IFG (impaired fasting glucose)   . OSA (obstructive sleep apnea)    Severe w AHI 34/hr on HST no on BiPAP at 19/15cm H2O  . PE (pulmonary embolism)    Second  occurance,enknown etiology,lifelong anticoagulation    Social History   Tobacco Use  . Smoking status: Never  . Smokeless tobacco: Never  Vaping Use  . Vaping Use: Never used  Substance Use Topics  . Alcohol use: Yes    Comment: 1-2 per week  . Drug use: Never    Family History  Problem Relation Age of Onset  . Osteoporosis Mother   . Hypertension Father   . CAD Father     No Active Allergies  Review of Systems  Constitutional:  Negative for chills and fever.  HENT: Negative.    Respiratory: Negative.    Cardiovascular: Negative.   Gastrointestinal: Negative.   Genitourinary: Negative.   Musculoskeletal:  Positive for joint pain.  Skin: Negative.   All other systems reviewed and are negative.  OBJECTIVE:   Blood pressure 117/67, pulse 68, temperature 98.2 F (36.8 C), temperature source Oral, resp. rate 20, height 5' 10"  (1.778 m), weight 95.3 kg, SpO2 100 %. Body mass index is 30.15 kg/m.  Physical Exam Constitutional:      General: He is not in acute distress.    Appearance: Normal appearance.  HENT:     Head: Normocephalic and atraumatic.  Cardiovascular:     Rate and Rhythm: Normal rate and regular rhythm.  Pulmonary:     Effort: Pulmonary effort is normal. No respiratory distress.     Breath sounds: Normal breath sounds.  Abdominal:     General: There is no distension.     Palpations: Abdomen is soft.     Tenderness: There is no abdominal tenderness.  Musculoskeletal:     Comments: Right knee immobilized.  Wound vac and surgical drains in place.   Skin:    General: Skin is warm and dry.     Findings: No rash.  Neurological:     General: No focal deficit present.     Mental Status: He is alert and oriented to person, place, and time.  Psychiatric:        Mood and Affect: Mood normal.        Behavior: Behavior normal.     Lab Results: Lab Results  Component Value Date   WBC 13.1 (H) 05/27/2021   HGB 10.4 (L) 05/27/2021   HCT 32.2 (L)  05/27/2021   MCV 92.3 05/27/2021   PLT 427 (H) 05/27/2021    Lab Results  Component Value Date   NA 135 05/27/2021   K 4.7 05/27/2021   CO2 24 05/27/2021   GLUCOSE 146 (H) 05/27/2021   BUN 18 05/27/2021   CREATININE 0.86 05/27/2021   CALCIUM 8.6 (L) 05/27/2021   GFRNONAA >60 05/27/2021   GFRAA 99 10/21/2020    Lab Results  Component Value Date   ALT 41 05/26/2021   AST 52 (H) 05/26/2021   GGT 215 (H) 05/24/2021   ALKPHOS 350 (H) 05/26/2021   BILITOT 0.4 05/26/2021       Component Value Date/Time   CRP 32.2 (H)  05/24/2021 1847       Component Value Date/Time   ESRSEDRATE 139 (H) 05/24/2021 1847    I have reviewed the micro and lab results in Epic.  Imaging: DG Knee Right Port  Result Date: 05/26/2021 CLINICAL DATA:  Knee replacement infection EXAM: PORTABLE RIGHT KNEE - 1-2 VIEW COMPARISON:  05/13/2021 FINDINGS: Cutaneous staples. Gas within the soft tissues consistent with recent surgery. Coarse calcification posterior to the knee as before. No fracture seen. Revision of femoral prosthesis and removal of hardware from the proximal tibia with radiolucent spacer in place. Vascular calcifications. Surgical drain at the lateral knee. IMPRESSION: Postsurgical changes of the right knee Electronically Signed   By: Donavan Foil M.D.   On: 05/26/2021 19:54     Imaging  independently reviewed in Epic.  [name]ious [facility] [phone] pager 05/27/2021, 8:40 AM  I spent greater than 80 minutes with the patient including greater than 50% of time in face to face counsel of the patient and in coordination of their care.

## 2021-05-27 NOTE — Progress Notes (Signed)
    Subjective:  Patient reports pain as mild.  Denies N/V/CP/SOB.   Objective:   VITALS:   Vitals:   05/26/21 1853 05/26/21 2242 05/27/21 0200 05/27/21 0647  BP: 134/68 120/62 128/64 117/67  Pulse: (!) 57 66 65 68  Resp: '16 16 16 20  '$ Temp: 97.9 F (36.6 C) 98.1 F (36.7 C) 98.2 F (36.8 C) 98.2 F (36.8 C)  TempSrc: Oral Oral Oral Oral  SpO2: 100% 96% 99% 100%  Weight:      Height:        NAD ABD soft Neurovascular intact Sensation intact distally Intact pulses distally Dorsiflexion/Plantar flexion intact Incision: Prevena dressing in place. Hemovac in place   Lab Results  Component Value Date   WBC 13.1 (H) 05/27/2021   HGB 10.4 (L) 05/27/2021   HCT 32.2 (L) 05/27/2021   MCV 92.3 05/27/2021   PLT 427 (H) 05/27/2021   BMET    Component Value Date/Time   NA 135 05/27/2021 0425   NA 139 04/07/2021 0815   K 4.7 05/27/2021 0425   CL 102 05/27/2021 0425   CO2 24 05/27/2021 0425   GLUCOSE 146 (H) 05/27/2021 0425   BUN 18 05/27/2021 0425   BUN 13 04/07/2021 0815   CREATININE 0.86 05/27/2021 0425   CREATININE 1.02 06/08/2016 1041   CALCIUM 8.6 (L) 05/27/2021 0425   GFRNONAA >60 05/27/2021 0425   GFRAA 99 10/21/2020 0932     Assessment/Plan: 1 Day Post-Op   Active Problems:   Septic arthritis of knee, right ([place])   TDWB RLE DVT ppx: Xarelto, SCDs, TEDS PO pain control PT/OT ID consult to come. Strep anginosus in joint fluid culture. 2g IV ceftriaxone q 24.  Likely need PICC line placement.  Prevena dressing should be changed from house vac to Prevena portable vac at discharge Hemovac: D/C once output 30cc per shift or less Dispo: Pending.  Will need follow up with Dr.Swinteck 1 week post discharge for incisional vac removal.   Dorothyann Peng 05/27/2021, 9:07 AM [facility] now [employer] [address] [place], [place], [place] [address] Phone: [phone] [email or web address] Facebook  [employer]

## 2021-05-27 NOTE — Progress Notes (Signed)
PROGRESS NOTE    NAME SEESE  [id number] DOB: [birth year] DOA: 05/24/2021 PCP: Mayra Neer, MD   Brief Narrative: Patrick Fritz is a 70 y.o. male with PMH of bilateral TKA, systolic CHF, CAD/STEMI s/p CABG in 2017, PE/DVT on Xarelto, HTN, anxiety, PTSD, osteoarthritis/radiculopathy and OSA not on CPAP presenting with right knee pain and swelling.   Patient reports worsening of chronic right knee pain over the last 2 weeks.  Patrick Fritz presented to ED on 7/29 and had an x-ray and venous Doppler that showed knee joint effusion with loose body in Baker's cyst.  Patrick Fritz was discharged home to follow-up with orthopedic surgery.  Patrick Fritz was seen at [facility] 6 days ago and had arthrocentesis.  Patrick Fritz returned to [facility] for follow-up today, and directed to hospital with concern for septic arthritis.  Synovial fluid from [facility] with 79,000 nucleated cells with 93.5% neutrophils and GPC in pairs.    Patient has chills but no fever.  Right knee feels hot at times.  Patrick Fritz rates Patrick Fritz pain 9/10 when Patrick Fritz was at [facility] but improved to 7/10 here without medication.  Patrick Fritz also noted swelling and limited range of motion due to pain.  Patrick Fritz denies numbness or tingling.  Patrick Fritz denies chest pain at rest or with exertion, cough, nausea, vomiting or UTI symptoms other than nocturnal polyuria.  Patrick Fritz reports "a little" shortness of breath unchanged from baseline.  Last dose on Patrick Fritz Xarelto was the evening of 8/8.  Patrick Fritz has bilateral TKA was 26 years ago.  Patrick Fritz denies history of diabetes.  Patient stated that Patrick Fritz penicillin allergy was when Patrick Fritz was very young.  Patrick Fritz reports using penicillin prophylactically during dental procedures. Patient lives with Patrick Fritz wife.  Denies smoking cigarette.  Admits to social alcohol.  Denies recreational drug use.  Prefers to remain full code.   On arrival, vital signs within normal.  ALP 422.  WBC 10.4.  Hgb 11.3.  Platelet 579.  Otherwise, CBC and CMP without significant finding.  CRP elevated to 32.2.   Lactic acid 1.3.  INR 1.4.  APTT 42.  COVID-19 and influenza PCR nonreactive.  Blood cultures obtained.  Patient was started on IV vancomycin for septic arthritis of right knee and IV heparin for chronic PE/DVT.  Per patient, Dr. Lyla Glassing from [facility] to do arthroscopic washout of Patrick Fritz right knee on 8/11.  Assessment & Plan:   Active Problems:   Septic arthritis of knee, right (HCC)  #1  Right knee septic arthritis -on Rocephin 2 g IV daily.  Apparently the culture that was done in the office from the joint fluid is growing strep anginous.  Await ID input.   Patrick Fritz had resection of the right total knee arthroplasty with placement of articulating Prostalac spacer 05/26/2021  #2 history of chronic PE and DVT on Xarelto at home.  This has been on hold continue IV heparin perioperatively.  #3 history of CAD CABG and systolic CHF-continue home meds including Coreglosartan, Aldactone and Farxiga -Strict intake and output and daily weight  #4HTN -blood pressure still soft 115/69 continue to hold Aldactone.    Estimated body mass index is 30.15 kg/m as calculated from the following:   Height as of this encounter: '5\' 10"'$  (1.778 m).   Weight as of this encounter: 95.3 kg.  DVT prophylaxis: HEPARIN Code Status: FULL Family Communication: none at bed side Disposition Plan:  Status is: Inpatient  Remains inpatient appropriate because:IV treatments appropriate due to intensity of illness or inability to take  PO  Dispo: The patient is from: Home              Anticipated d/c is to: Home              Patient currently is not medically stable to d/c.   Difficult to place patient No       Consultants:  ortho  Procedures: rocephin Antimicrobials:rocephin  Subjective: Patrick Fritz is resting in bed Patrick Fritz has no specific complaints no events overnight reported by the nursing staff  Objective: Vitals:   05/26/21 2242 05/27/21 0200 05/27/21 0647 05/27/21 1404  BP: 120/62 128/64 117/67 115/69  Pulse: 66  65 68 67  Resp: '16 16 20 18  '$ Temp: 98.1 F (36.7 C) 98.2 F (36.8 C) 98.2 F (36.8 C) 98.5 F (36.9 C)  TempSrc: Oral Oral Oral Oral  SpO2: 96% 99% 100% 97%  Weight:      Height:        Intake/Output Summary (Last 24 hours) at 05/27/2021 1515 Last data filed at 05/27/2021 1400 Gross per 24 hour  Intake 930 ml  Output 1330 ml  Net -400 ml    Filed Weights   05/25/21 0452 05/26/21 0520 05/26/21 0730  Weight: 95.4 kg 95.1 kg 95.3 kg    Examination:  General exam: Appears calm and comfortable  Respiratory system: Clear to auscultation. Respiratory effort normal. Cardiovascular system: S1 & S2 heard, RRR. No JVD, murmurs, rubs, gallops or clicks. No pedal edema. Gastrointestinal system: Abdomen is nondistended, soft and nontender. No organomegaly or masses felt. Normal bowel sounds heard. Central nervous system: Alert and oriented. No focal neurological deficits. Extremities: right knee swollen and covered with dressing Skin: No rashes, lesions or ulcers Psychiatry: Judgement and insight appear normal. Mood & affect appropriate.     Data Reviewed: I have personally reviewed following labs and imaging studies  CBC: Recent Labs  Lab 05/24/21 1847 05/25/21 0609 05/26/21 0356 05/27/21 0425  WBC 10.4 7.4 8.0 13.1*  NEUTROABS 8.2*  --   --   --   HGB 11.3* 10.6* 10.3* 10.4*  HCT 35.8* 33.7* 32.3* 32.2*  MCV 93.0 93.1 92.6 92.3  PLT 579* 480* 467* 427*    Basic Metabolic Panel: Recent Labs  Lab 05/24/21 1847 05/25/21 0609 05/26/21 0356 05/27/21 0425  NA 138 139 136 135  K 4.4 4.7 4.2 4.7  CL 99 102 102 102  CO2 '26 27 25 24  '$ GLUCOSE 102* 104* 123* 146*  BUN '16 18 17 18  '$ CREATININE 1.00 0.85 0.84 0.86  CALCIUM 9.1 8.8* 8.5* 8.6*  MG 2.0 2.1  --   --   PHOS  --  3.9  --   --     GFR: Estimated Creatinine Clearance: 89.9 mL/min (by C-G formula based on SCr of 0.86 mg/dL). Liver Function Tests: Recent Labs  Lab 05/24/21 1847 05/25/21 0609 05/26/21 0356   AST 41 31 52*  ALT 42 33 41  ALKPHOS 422* 333* 350*  BILITOT 0.6 0.5 0.4  PROT 7.7 6.9 6.7  ALBUMIN 2.7* 2.4* 2.4*    No results for input(s): LIPASE, AMYLASE in the last 168 hours. No results for input(s): AMMONIA in the last 168 hours. Coagulation Profile: Recent Labs  Lab 05/24/21 1847  INR 1.4*    Cardiac Enzymes: No results for input(s): CKTOTAL, CKMB, CKMBINDEX, TROPONINI in the last 168 hours. BNP (last 3 results) No results for input(s): PROBNP in the last 8760 hours. HbA1C: Recent Labs    05/24/21 1847  HGBA1C 6.3*    CBG: No results for input(s): GLUCAP in the last 168 hours. Lipid Profile: No results for input(s): CHOL, HDL, LDLCALC, TRIG, CHOLHDL, LDLDIRECT in the last 72 hours. Thyroid Function Tests: No results for input(s): TSH, T4TOTAL, FREET4, T3FREE, THYROIDAB in the last 72 hours. Anemia Panel: No results for input(s): VITAMINB12, FOLATE, FERRITIN, TIBC, IRON, RETICCTPCT in the last 72 hours. Sepsis Labs: Recent Labs  Lab 05/24/21 1918 05/24/21 2043  LATICACIDVEN 1.3 1.6     Recent Results (from the past 240 hour(s))  Culture, blood (routine x 2)     Status: None (Preliminary result)   Collection Time: 05/24/21  6:19 PM   Specimen: BLOOD  Result Value Ref Range Status   Specimen Description   Final    BLOOD LEFT ANTECUBITAL Performed at [facility], [place] [address], [place], [place] [address]    Special Requests   Final    Blood Culture adequate volume BOTTLES DRAWN AEROBIC AND ANAEROBIC Performed at [place] [address], [place], [place] [address]    Culture   Final    NO GROWTH 3 DAYS Performed at [facility], [place] [address] [place], [place] [address]    Report Status PENDING  Incomplete  Resp Panel by RT-PCR (Flu A&B, Covid) Nasopharyngeal Swab     Status: None   Collection Time: 05/24/21  6:35 PM   Specimen: Nasopharyngeal Swab; Nasopharyngeal(NP) swabs in vial transport  medium  Result Value Ref Range Status   SARS Coronavirus 2 by RT PCR NEGATIVE NEGATIVE Final    Comment: (NOTE) SARS-CoV-2 target nucleic acids are NOT DETECTED.  The SARS-CoV-2 RNA is generally detectable in upper respiratory specimens during the acute phase of infection. The lowest concentration of SARS-CoV-2 viral copies this assay can detect is 138 copies/mL. A negative result does not preclude SARS-Cov-2 infection and should not be used as the sole basis for treatment or other patient management decisions. A negative result may occur with  improper specimen collection/handling, submission of specimen other than nasopharyngeal swab, presence of viral mutation(s) within the areas targeted by this assay, and inadequate number of viral copies(<138 copies/mL). A negative result must be combined with clinical observations, patient history, and epidemiological information. The expected result is Negative.  Fact Sheet for Patients:  EntrepreneurPulse.com.au  Fact Sheet for Healthcare Providers:  IncredibleEmployment.be  This test is no t yet approved or cleared by the [place] FDA and  has been authorized for detection and/or diagnosis of SARS-CoV-2 by FDA under an Emergency Use Authorization (EUA). This EUA will remain  in effect (meaning this test can be used) for the duration of the COVID-19 declaration under Section 564(b)(1) of the Act, 21 U.S.C.section 360bbb-3(b)(1), unless the authorization is terminated  or revoked sooner.       Influenza A by PCR NEGATIVE NEGATIVE Final   Influenza B by PCR NEGATIVE NEGATIVE Final    Comment: (NOTE) The Xpert Xpress SARS-CoV-2/FLU/RSV plus assay is intended as an aid in the diagnosis of influenza from Nasopharyngeal swab specimens and should not be used as a sole basis for treatment. Nasal washings and aspirates are unacceptable for Xpert Xpress SARS-CoV-2/FLU/RSV testing.  Fact Sheet for  Patients: EntrepreneurPulse.com.au  Fact Sheet for Healthcare Providers: IncredibleEmployment.be  This test is not yet approved or cleared by the [place] FDA and has been authorized for detection and/or diagnosis of SARS-CoV-2 by FDA under an Emergency Use Authorization (EUA). This EUA will remain in effect (  meaning this test can be used) for the duration of the COVID-19 declaration under Section 564(b)(1) of the Act, 21 U.S.C. section 360bbb-3(b)(1), unless the authorization is terminated or revoked.  Performed at [facility], [place] [address] [place], [place] [address]   Culture, blood (routine x 2)     Status: Abnormal   Collection Time: 05/24/21  6:47 PM   Specimen: BLOOD  Result Value Ref Range Status   Specimen Description   Final    BLOOD LEFT ANTECUBITAL Performed at [facility] [address], [place], [place] [address]    Special Requests   Final    BOTTLES DRAWN AEROBIC AND ANAEROBIC Blood Culture adequate volume Performed at [facility] [address], [place], [place] [address]    Culture  Setup Time   Final    GRAM POSITIVE COCCI IN CLUSTERS AEROBIC BOTTLE ONLY Organism ID to follow CRITICAL RESULT CALLED TO, READ BACK BY AND VERIFIED WITH: Shelda Jakes PHARMD [id number] 05/25/21 A BROWNING    Culture (A)  Final    STAPHYLOCOCCUS EPIDERMIDIS THE SIGNIFICANCE OF ISOLATING THIS ORGANISM FROM A SINGLE SET OF BLOOD CULTURES WHEN MULTIPLE SETS ARE DRAWN IS UNCERTAIN. PLEASE NOTIFY THE MICROBIOLOGY DEPARTMENT WITHIN ONE WEEK IF SPECIATION AND SENSITIVITIES ARE REQUIRED. Performed at [facility], [place] [address] [place], [place] [address]    Report Status 05/27/2021 FINAL  Final  Blood Culture ID Panel (Reflexed)     Status: Abnormal   Collection Time: 05/24/21  6:47 PM  Result Value Ref Range Status   Enterococcus faecalis NOT DETECTED NOT DETECTED Final    Enterococcus Faecium NOT DETECTED NOT DETECTED Final   Listeria monocytogenes NOT DETECTED NOT DETECTED Final   Staphylococcus species DETECTED (A) NOT DETECTED Final    Comment: CRITICAL RESULT CALLED TO, READ BACK BY AND VERIFIED WITH: Shelda Jakes PHARMD [id number] 05/25/21 A BROWNING    Staphylococcus aureus (BCID) NOT DETECTED NOT DETECTED Final   Staphylococcus epidermidis DETECTED (A) NOT DETECTED Final    Comment: CRITICAL RESULT CALLED TO, READ BACK BY AND VERIFIED WITH: Shelda Jakes PHARMD [id number] 05/25/21 A BROWNING    Staphylococcus lugdunensis NOT DETECTED NOT DETECTED Final   Streptococcus species NOT DETECTED NOT DETECTED Final   Streptococcus agalactiae NOT DETECTED NOT DETECTED Final   Streptococcus pneumoniae NOT DETECTED NOT DETECTED Final   Streptococcus pyogenes NOT DETECTED NOT DETECTED Final   A.calcoaceticus-baumannii NOT DETECTED NOT DETECTED Final   Bacteroides fragilis NOT DETECTED NOT DETECTED Final   Enterobacterales NOT DETECTED NOT DETECTED Final   Enterobacter cloacae complex NOT DETECTED NOT DETECTED Final   Escherichia coli NOT DETECTED NOT DETECTED Final   Klebsiella aerogenes NOT DETECTED NOT DETECTED Final   Klebsiella oxytoca NOT DETECTED NOT DETECTED Final   Klebsiella pneumoniae NOT DETECTED NOT DETECTED Final   Proteus species NOT DETECTED NOT DETECTED Final   Salmonella species NOT DETECTED NOT DETECTED Final   Serratia marcescens NOT DETECTED NOT DETECTED Final   Haemophilus influenzae NOT DETECTED NOT DETECTED Final   Neisseria meningitidis NOT DETECTED NOT DETECTED Final   Pseudomonas aeruginosa NOT DETECTED NOT DETECTED Final   Stenotrophomonas maltophilia NOT DETECTED NOT DETECTED Final   Candida albicans NOT DETECTED NOT DETECTED Final   Candida auris NOT DETECTED NOT DETECTED Final   Candida glabrata NOT DETECTED NOT DETECTED Final   Candida krusei NOT DETECTED NOT DETECTED Final   Candida parapsilosis NOT DETECTED NOT DETECTED Final   Candida  tropicalis NOT  DETECTED NOT DETECTED Final   Cryptococcus neoformans/gattii NOT DETECTED NOT DETECTED Final   Methicillin resistance mecA/C NOT DETECTED NOT DETECTED Final    Comment: Performed at [facility], [place] [address] [place], [place] [address]  MRSA Next Gen by PCR, Nasal     Status: None   Collection Time: 05/26/21 12:51 PM   Specimen: Nasal Mucosa; Nasal Swab  Result Value Ref Range Status   MRSA by PCR Next Gen NOT DETECTED NOT DETECTED Final    Comment: (NOTE) The GeneXpert MRSA Assay (FDA approved for NASAL specimens only), is one component of a comprehensive MRSA colonization surveillance program. It is not intended to diagnose MRSA infection nor to guide or monitor treatment for MRSA infections. Test performance is not FDA approved in patients less than 20 years old. Performed at [facility], [place] [address], [place], [place] [address]           Radiology Studies: DG Knee Right Port  Result Date: 05/26/2021 CLINICAL DATA:  Knee replacement infection EXAM: PORTABLE RIGHT KNEE - 1-2 VIEW COMPARISON:  05/13/2021 FINDINGS: Cutaneous staples. Gas within the soft tissues consistent with recent surgery. Coarse calcification posterior to the knee as before. No fracture seen. Revision of femoral prosthesis and removal of hardware from the proximal tibia with radiolucent spacer in place. Vascular calcifications. Surgical drain at the lateral knee. IMPRESSION: Postsurgical changes of the right knee Electronically Signed   By: Donavan Foil M.D.   On: 05/26/2021 19:54        Scheduled Meds:  allopurinol  300 mg Oral Daily   dapagliflozin propanediol  10 mg Oral QAC breakfast   docusate sodium  100 mg Oral BID   feeding supplement  237 mL Oral BID BM   gabapentin  100 mg Oral BID   losartan  25 mg Oral Daily   rivaroxaban  20 mg Oral Q supper   senna  1 tablet Oral BID   sertraline  150 mg Oral Daily   simvastatin  40 mg Oral QHS   Continuous  Infusions:  sodium chloride Stopped (05/26/21 1200)   cefTRIAXone (ROCEPHIN)  IV 2 g (05/27/21 0916)   methocarbamol (ROBAXIN) IV       LOS: 3 days    Time spent: 13 min    Georgette Shell, MD  05/27/2021, 3:15 PM

## 2021-05-28 DIAGNOSIS — T8459XD Infection and inflammatory reaction due to other internal joint prosthesis, subsequent encounter: Secondary | ICD-10-CM

## 2021-05-28 DIAGNOSIS — Z96659 Presence of unspecified artificial knee joint: Secondary | ICD-10-CM

## 2021-05-28 DIAGNOSIS — T8459XA Infection and inflammatory reaction due to other internal joint prosthesis, initial encounter: Principal | ICD-10-CM

## 2021-05-28 LAB — CBC
HCT: 34.5 % — ABNORMAL LOW (ref 39.0–52.0)
Hemoglobin: 10.8 g/dL — ABNORMAL LOW (ref 13.0–17.0)
MCH: 29 pg (ref 26.0–34.0)
MCHC: 31.3 g/dL (ref 30.0–36.0)
MCV: 92.7 fL (ref 80.0–100.0)
Platelets: 438 10*3/uL — ABNORMAL HIGH (ref 150–400)
RBC: 3.72 MIL/uL — ABNORMAL LOW (ref 4.22–5.81)
RDW: 13.4 % (ref 11.5–15.5)
WBC: 13.2 10*3/uL — ABNORMAL HIGH (ref 4.0–10.5)
nRBC: 0 % (ref 0.0–0.2)

## 2021-05-28 MED ORDER — MELATONIN 5 MG PO TABS
5.0000 mg | ORAL_TABLET | Freq: Every evening | ORAL | Status: DC | PRN
  Administered 2021-05-28: 5 mg via ORAL
  Filled 2021-05-28: qty 1

## 2021-05-28 NOTE — Progress Notes (Signed)
PROGRESS NOTE    [name]  [id number] DOB: Jan 08, 1951 DOA: 05/24/2021 PCP: Patrick Neer, MD   Brief Narrative: [facility] is a 70 y.o. male with PMH of bilateral TKA, systolic CHF, CAD/STEMI s/p CABG in 2017, PE/DVT on Xarelto, HTN, anxiety, PTSD, osteoarthritis/radiculopathy and OSA not on CPAP presenting with right knee pain and swelling.   Patient reports worsening of chronic right knee pain over the last 2 weeks.  He presented to ED on 7/29 and had an x-ray and venous Doppler that showed knee joint effusion with loose body in Baker's cyst.  He was discharged home to follow-up with orthopedic surgery.  He was seen at [facility] 6 days ago and had arthrocentesis.  He returned to [facility] for follow-up today, and directed to [facility] with concern for septic arthritis.  Synovial fluid from [facility] with 79,000 nucleated cells with 93.5% neutrophils and GPC in pairs.    Patient has chills but no fever.  Right knee feels hot at times.  He rates his pain 9/10 when he was at [facility] but improved to 7/10 here without medication.  He also noted swelling and limited range of motion due to pain.  He denies numbness or tingling.  He denies chest pain at rest or with exertion, cough, nausea, vomiting or UTI symptoms other than nocturnal polyuria.  He reports "a little" shortness of breath unchanged from baseline.  Last dose on his Xarelto was the evening of 8/8.  He has bilateral TKA was 26 years ago.  He denies history of diabetes.  Patient stated that his penicillin allergy was when he was very young.  He reports using penicillin prophylactically during dental procedures. Patient lives with his wife.  Denies smoking cigarette.  Admits to social alcohol.  Denies recreational drug use.  Prefers to remain full code.   On arrival, vital signs within normal.  ALP 422.  WBC 10.4.  Hgb 11.3.  Platelet 579.  Otherwise, CBC and CMP without significant finding.  CRP elevated to 32.2.   Lactic acid 1.3.  INR 1.4.  APTT 42.  COVID-19 and influenza PCR nonreactive.  Blood cultures obtained.  Patient was started on IV vancomycin for septic arthritis of right knee and IV heparin for chronic PE/DVT.  Per patient, [facility] from [facility] to do arthroscopic washout of his right knee on 8/11.  Assessment & Plan:   Active Problems:   Septic arthritis of knee, right (HCC)  #1  Right knee septic arthritis -on Rocephin 2 g IV daily.  Apparently the culture that was done in the office from the joint fluid is growing strep anginous.  ID recommending ceftriaxone 2 g daily for 4 weeks.  Await PICC line placement. He had resection of the right total knee arthroplasty with placement of articulating Prostalac spacer 05/26/2021  #2 history of chronic PE and DVT on Xarelto at home.  This has been on hold continue IV heparin perioperatively.  #3 history of CAD CABG and systolic CHF-continue home meds including Coreglosartan, Aldactone and Farxiga -Strict intake and output and daily weight  #4HTN -blood pressure still soft 115/69 continue to hold Aldactone.    Estimated body mass index is 30.15 kg/m as calculated from the following:   Height as of this encounter: '5\' 10"'$  (1.778 m).   Weight as of this encounter: 95.3 kg.  DVT prophylaxis: HEPARIN Code Status: FULL Family Communication: none at bed side Disposition Plan:  Status is: Inpatient  Remains inpatient appropriate because:IV treatments appropriate  due to intensity of illness or inability to take PO  Dispo: The patient is from: Home              Anticipated d/c is to: Home              Patient currently is not medically stable to d/c.   Difficult to place patient No       Consultants:  ortho  Procedures: rocephin Antimicrobials:rocephin  Subjective: He is resting in bed he has no specific complaints no events overnight reported by the nursing staff  Objective: Vitals:   05/27/21 1404 05/27/21 2125 05/28/21 0513  05/28/21 1241  BP: 115/69 121/64 123/68 129/67  Pulse: 67 70 67 72  Resp: '18 20 18 '$ (!) 22  Temp: 98.5 F (36.9 C) 99.7 F (37.6 C) 98.3 F (36.8 C) 97.9 F (36.6 C)  TempSrc: Oral Oral Oral Oral  SpO2: 97% 98% 93% 96%  Weight:      Height:        Intake/Output Summary (Last 24 hours) at 05/28/2021 1527 Last data filed at 05/28/2021 1452 Gross per 24 hour  Intake 820 ml  Output 1523 ml  Net -703 ml    Filed Weights   05/25/21 0452 05/26/21 0520 05/26/21 0730  Weight: 95.4 kg 95.1 kg 95.3 kg    Examination:  General exam: Appears calm and comfortable  Respiratory system: Clear to auscultation. Respiratory effort normal. Cardiovascular system: S1 & S2 heard, RRR. No JVD, murmurs, rubs, gallops or clicks. No pedal edema. Gastrointestinal system: Abdomen is nondistended, soft and nontender. No organomegaly or masses felt. Normal bowel sounds heard. Central nervous system: Alert and oriented. No focal neurological deficits. Extremities: right knee swollen and covered with dressing Skin: No rashes, lesions or ulcers Psychiatry: Judgement and insight appear normal. Mood & affect appropriate.     Data Reviewed: I have personally reviewed following labs and imaging studies  CBC: Recent Labs  Lab 05/24/21 1847 05/25/21 0609 05/26/21 0356 05/27/21 0425 05/28/21 0423  WBC 10.4 7.4 8.0 13.1* 13.2*  NEUTROABS 8.2*  --   --   --   --   HGB 11.3* 10.6* 10.3* 10.4* 10.8*  HCT 35.8* 33.7* 32.3* 32.2* 34.5*  MCV 93.0 93.1 92.6 92.3 92.7  PLT 579* 480* 467* 427* 438*    Basic Metabolic Panel: Recent Labs  Lab 05/24/21 1847 05/25/21 0609 05/26/21 0356 05/27/21 0425  NA 138 139 136 135  K 4.4 4.7 4.2 4.7  CL 99 102 102 102  CO2 '26 27 25 24  '$ GLUCOSE 102* 104* 123* 146*  BUN '16 18 17 18  '$ CREATININE 1.00 0.85 0.84 0.86  CALCIUM 9.1 8.8* 8.5* 8.6*  MG 2.0 2.1  --   --   PHOS  --  3.9  --   --     GFR: Estimated Creatinine Clearance: 89.9 mL/min (by C-G formula based  on SCr of 0.86 mg/dL). Liver Function Tests: Recent Labs  Lab 05/24/21 1847 05/25/21 0609 05/26/21 0356  AST 41 31 52*  ALT 42 33 41  ALKPHOS 422* 333* 350*  BILITOT 0.6 0.5 0.4  PROT 7.7 6.9 6.7  ALBUMIN 2.7* 2.4* 2.4*    No results for input(s): LIPASE, AMYLASE in the last 168 hours. No results for input(s): AMMONIA in the last 168 hours. Coagulation Profile: Recent Labs  Lab 05/24/21 1847  INR 1.4*    Cardiac Enzymes: No results for input(s): CKTOTAL, CKMB, CKMBINDEX, TROPONINI in the last 168 hours. BNP (last 3  results) No results for input(s): PROBNP in the last 8760 hours. HbA1C: No results for input(s): HGBA1C in the last 72 hours.  CBG: No results for input(s): GLUCAP in the last 168 hours. Lipid Profile: No results for input(s): CHOL, HDL, LDLCALC, TRIG, CHOLHDL, LDLDIRECT in the last 72 hours. Thyroid Function Tests: No results for input(s): TSH, T4TOTAL, FREET4, T3FREE, THYROIDAB in the last 72 hours. Anemia Panel: No results for input(s): VITAMINB12, FOLATE, FERRITIN, TIBC, IRON, RETICCTPCT in the last 72 hours. Sepsis Labs: Recent Labs  Lab 05/24/21 1918 05/24/21 2043  LATICACIDVEN 1.3 1.6     Recent Results (from the past 240 hour(s))  Culture, blood (routine x 2)     Status: None (Preliminary result)   Collection Time: 05/24/21  6:19 PM   Specimen: BLOOD  Result Value Ref Range Status   Specimen Description   Final    BLOOD LEFT ANTECUBITAL Performed at [facility], [place] [address], [place], [place] [address]    Special Requests   Final    Blood Culture adequate volume BOTTLES DRAWN AEROBIC AND ANAEROBIC Performed at [place] [address] [place], [place] [address]    Culture   Final    NO GROWTH 4 DAYS Performed at [facility], [place] [address] [place], [place] [address]    Report Status PENDING  Incomplete  Resp Panel by RT-PCR (Flu A&B, Covid) Nasopharyngeal Swab     Status: None    Collection Time: 05/24/21  6:35 PM   Specimen: Nasopharyngeal Swab; Nasopharyngeal(NP) swabs in vial transport medium  Result Value Ref Range Status   SARS Coronavirus 2 by RT PCR NEGATIVE NEGATIVE Final    Comment: (NOTE) SARS-CoV-2 target nucleic acids are NOT DETECTED.  The SARS-CoV-2 RNA is generally detectable in upper respiratory specimens during the acute phase of infection. The lowest concentration of SARS-CoV-2 viral copies this assay can detect is 138 copies/mL. A negative result does not preclude SARS-Cov-2 infection and should not be used as the sole basis for treatment or other patient management decisions. A negative result may occur with  improper specimen collection/handling, submission of specimen other than nasopharyngeal swab, presence of viral mutation(s) within the areas targeted by this assay, and inadequate number of viral copies(<138 copies/mL). A negative result must be combined with clinical observations, patient history, and epidemiological information. The expected result is Negative.  Fact Sheet for Patients:  EntrepreneurPulse.com.au  Fact Sheet for Healthcare Providers:  IncredibleEmployment.be  This test is no t yet approved or cleared by the [place] FDA and  has been authorized for detection and/or diagnosis of SARS-CoV-2 by FDA under an Emergency Use Authorization (EUA). This EUA will remain  in effect (meaning this test can be used) for the duration of the COVID-19 declaration under Section 564(b)(1) of the Act, 21 U.S.C.section 360bbb-3(b)(1), unless the authorization is terminated  or revoked sooner.       Influenza A by PCR NEGATIVE NEGATIVE Final   Influenza B by PCR NEGATIVE NEGATIVE Final    Comment: (NOTE) The Xpert Xpress SARS-CoV-2/FLU/RSV plus assay is intended as an aid in the diagnosis of influenza from Nasopharyngeal swab specimens and should not be used as a sole basis for treatment.  Nasal washings and aspirates are unacceptable for Xpert Xpress SARS-CoV-2/FLU/RSV testing.  Fact Sheet for Patients: EntrepreneurPulse.com.au  Fact Sheet for Healthcare Providers: IncredibleEmployment.be  This test is not yet approved or cleared by the [place] FDA and has been authorized for detection  and/or diagnosis of SARS-CoV-2 by FDA under an Emergency Use Authorization (EUA). This EUA will remain in effect (meaning this test can be used) for the duration of the COVID-19 declaration under Section 564(b)(1) of the Act, 21 U.S.C. section 360bbb-3(b)(1), unless the authorization is terminated or revoked.  Performed at [facility], [place] [address], [place], [place] [address]   Culture, blood (routine x 2)     Status: Abnormal   Collection Time: 05/24/21  6:47 PM   Specimen: BLOOD  Result Value Ref Range Status   Specimen Description   Final    BLOOD LEFT ANTECUBITAL Performed at [place] [address] [place], [place] [address]    Special Requests   Final    BOTTLES DRAWN AEROBIC AND ANAEROBIC Blood Culture adequate volume Performed at [place] [address] [place], [place] [address]    Culture  Setup Time   Final    GRAM POSITIVE COCCI IN CLUSTERS AEROBIC BOTTLE ONLY Organism ID to follow CRITICAL RESULT CALLED TO, READ BACK BY AND VERIFIED WITH: Shelda Jakes PHARMD [id number] 05/25/21 A BROWNING    Culture (A)  Final    STAPHYLOCOCCUS EPIDERMIDIS THE SIGNIFICANCE OF ISOLATING THIS ORGANISM FROM A SINGLE SET OF BLOOD CULTURES WHEN MULTIPLE SETS ARE DRAWN IS UNCERTAIN. PLEASE NOTIFY THE MICROBIOLOGY DEPARTMENT WITHIN ONE WEEK IF SPECIATION AND SENSITIVITIES ARE REQUIRED. Performed at [facility], [place] [address] [place], [place] [address]    Report Status 05/27/2021 FINAL  Final  Blood Culture ID Panel (Reflexed)     Status: Abnormal   Collection Time: 05/24/21   6:47 PM  Result Value Ref Range Status   Enterococcus faecalis NOT DETECTED NOT DETECTED Final   Enterococcus Faecium NOT DETECTED NOT DETECTED Final   Listeria monocytogenes NOT DETECTED NOT DETECTED Final   Staphylococcus species DETECTED (A) NOT DETECTED Final    Comment: CRITICAL RESULT CALLED TO, READ BACK BY AND VERIFIED WITH: Shelda Jakes PHARMD [id number] 05/25/21 A BROWNING    Staphylococcus aureus (BCID) NOT DETECTED NOT DETECTED Final   Staphylococcus epidermidis DETECTED (A) NOT DETECTED Final    Comment: CRITICAL RESULT CALLED TO, READ BACK BY AND VERIFIED WITH: Shelda Jakes PHARMD [id number] 05/25/21 A BROWNING    Staphylococcus lugdunensis NOT DETECTED NOT DETECTED Final   Streptococcus species NOT DETECTED NOT DETECTED Final   Streptococcus agalactiae NOT DETECTED NOT DETECTED Final   Streptococcus pneumoniae NOT DETECTED NOT DETECTED Final   Streptococcus pyogenes NOT DETECTED NOT DETECTED Final   A.calcoaceticus-baumannii NOT DETECTED NOT DETECTED Final   Bacteroides fragilis NOT DETECTED NOT DETECTED Final   Enterobacterales NOT DETECTED NOT DETECTED Final   Enterobacter cloacae complex NOT DETECTED NOT DETECTED Final   Escherichia coli NOT DETECTED NOT DETECTED Final   Klebsiella aerogenes NOT DETECTED NOT DETECTED Final   Klebsiella oxytoca NOT DETECTED NOT DETECTED Final   Klebsiella pneumoniae NOT DETECTED NOT DETECTED Final   Proteus species NOT DETECTED NOT DETECTED Final   Salmonella species NOT DETECTED NOT DETECTED Final   Serratia marcescens NOT DETECTED NOT DETECTED Final   Haemophilus influenzae NOT DETECTED NOT DETECTED Final   Neisseria meningitidis NOT DETECTED NOT DETECTED Final   Pseudomonas aeruginosa NOT DETECTED NOT DETECTED Final   Stenotrophomonas maltophilia NOT DETECTED NOT DETECTED Final   Candida albicans NOT DETECTED NOT DETECTED Final   Candida auris NOT DETECTED NOT DETECTED Final   Candida glabrata NOT DETECTED NOT DETECTED Final   Candida krusei NOT  DETECTED NOT DETECTED Final   Candida parapsilosis NOT DETECTED NOT DETECTED Final   Candida tropicalis NOT DETECTED NOT DETECTED Final   Cryptococcus neoformans/gattii NOT DETECTED NOT DETECTED Final   Methicillin resistance mecA/C NOT DETECTED NOT DETECTED Final    Comment: Performed at [facility], [place] [address], [place], [place] [address]  MRSA Next Gen by PCR, Nasal     Status: None   Collection Time: 05/26/21 12:51 PM   Specimen: Nasal Mucosa; Nasal Swab  Result Value Ref Range Status   MRSA by PCR Next Gen NOT DETECTED NOT DETECTED Final    Comment: (NOTE) The GeneXpert MRSA Assay (FDA approved for NASAL specimens only), is one component of a comprehensive MRSA colonization surveillance program. It is not intended to diagnose MRSA infection nor to guide or monitor treatment for MRSA infections. Test performance is not FDA approved in patients less than 91 years old. Performed at [facility], [place] [address] [place], [place] [address]           Radiology Studies: DG Knee Right Port  Result Date: 05/26/2021 CLINICAL DATA:  Knee replacement infection EXAM: PORTABLE RIGHT KNEE - 1-2 VIEW COMPARISON:  05/13/2021 FINDINGS: Cutaneous staples. Gas within the soft tissues consistent with recent surgery. Coarse calcification posterior to the knee as before. No fracture seen. Revision of femoral prosthesis and removal of hardware from the proximal tibia with radiolucent spacer in place. Vascular calcifications. Surgical drain at the lateral knee. IMPRESSION: Postsurgical changes of the right knee Electronically Signed   By: Donavan Foil M.D.   On: 05/26/2021 19:54   [place] EKG SITE RITE  Result Date: 05/27/2021 If Site Rite image not attached, placement could not be confirmed due to current cardiac rhythm.       Scheduled Meds:  allopurinol  300 mg Oral Daily   dapagliflozin propanediol  10 mg Oral QAC breakfast   docusate sodium  100 mg Oral BID    feeding supplement  237 mL Oral BID BM   gabapentin  100 mg Oral BID   losartan  25 mg Oral Daily   rivaroxaban  20 mg Oral Q supper   senna  1 tablet Oral BID   sertraline  150 mg Oral Daily   simvastatin  40 mg Oral QHS   Continuous Infusions:  cefTRIAXone (ROCEPHIN)  IV 2 g (05/28/21 1006)   methocarbamol (ROBAXIN) IV       LOS: 4 days    Time spent: 7 min    Georgette Shell, MD  05/28/2021, 3:27 PM

## 2021-05-28 NOTE — Progress Notes (Signed)
Physical Therapy Treatment Patient Details Name: [facility] MRN: [id number] DOB: [birth year] Today's Date: 05/28/2021    History of Present Illness Patient is 70 y.o. male s/p Rt TKA resection and antibiotic spacer placement on 05/26/21 due to Septic TKA. Patient is TDWB on Rt LE. PMH significant for OA, HTN, HLD, OSA, glaucoma, DVT, PE, Bil TKA in 1996, CABG in 2017.    PT Comments    Pt continues cooperative but limited this pm by c/o dizziness.  Pt ambulated 6' before c/o dizziness ; returned to sitting and BP 104/62; pt stood and BP 77/58; pt returned to sitting and RN alerted.  Family present and wife reports this has been an issue recently at home as well.  Family with many questions asked and answered.  Spouse is checking to see if family has WC with elevated leg rests available.  Follow Up Recommendations  Home health PT     Equipment Recommendations  Rolling walker with 5" wheels;3in1 (PT);Wheelchair (measurements PT) (wc with elevating leg rests)    Recommendations for Other Services       Precautions / Restrictions Precautions Precautions: Fall Required Braces or Orthoses: Knee Immobilizer - Right Knee Immobilizer - Right: On at all times Restrictions Weight Bearing Restrictions: Yes RLE Weight Bearing: Touchdown weight bearing    Mobility  Bed Mobility               General bed mobility comments: Pt up in chair and returned to same    Transfers Overall transfer level: Needs assistance Equipment used: Rolling walker (2 wheeled) Transfers: Sit to/from Stand Sit to Stand: Min assist;Mod assist         General transfer comment: cues for LE management and use of UEs to self assist  Ambulation/Gait Ambulation/Gait assistance: Min assist Gait Distance (Feet): 6 Feet Assistive device: Rolling walker (2 wheeled) Gait Pattern/deviations: Step-to pattern;Decreased stride length;Decreased weight shift to right Gait velocity: decr   General Gait  Details: cues for sequence, posture and TDWB; distance ltd by c/o dizziness   Stairs             Wheelchair Mobility    Modified Rankin (Stroke Patients Only)       Balance Overall balance assessment: History of Falls;Needs assistance Sitting-balance support: Feet supported;No upper extremity supported Sitting balance-Leahy Scale: Good     Standing balance support: Bilateral upper extremity supported Standing balance-Leahy Scale: Poor                              Cognition Arousal/Alertness: Awake/alert Behavior During Therapy: WFL for tasks assessed/performed Overall Cognitive Status: Within Functional Limits for tasks assessed                                        Exercises      General Comments        Pertinent Vitals/Pain Pain Assessment: 0-10 Pain Score: 3  Pain Location: Rt knee Pain Descriptors / Indicators: Aching;Discomfort;Burning Pain Intervention(s): Limited activity within patient's tolerance;Monitored during session    Home Living                      Prior Function            PT Goals (current goals can now be found in the care plan section) Acute Rehab PT Goals  Patient Stated Goal: get back independence PT Goal Formulation: With patient Time For Goal Achievement: 06/03/21 Potential to Achieve Goals: Good Progress towards PT goals: Progressing toward goals    Frequency    Min 5X/week      PT Plan Current plan remains appropriate    Co-evaluation              AM-PAC PT "6 Clicks" Mobility   Outcome Measure  Help needed turning from your back to your side while in a flat bed without using bedrails?: A Little Help needed moving from lying on your back to sitting on the side of a flat bed without using bedrails?: A Little Help needed moving to and from a bed to a chair (including a wheelchair)?: A Little Help needed standing up from a chair using your arms (e.g., wheelchair or  bedside chair)?: A Little Help needed to walk in hospital room?: A Little Help needed climbing 3-5 steps with a railing? : A Little 6 Click Score: 18    End of Session Equipment Utilized During Treatment: Gait belt;Right knee immobilizer Activity Tolerance: Other (comment) (orthostatic) Patient left: in chair;with call bell/phone within reach;with chair alarm set;with family/visitor present Nurse Communication: Mobility status;Weight bearing status PT Visit Diagnosis: Muscle weakness (generalized) (M62.81);Difficulty in walking, not elsewhere classified (R26.2);Pain Pain - Right/Left: Right Pain - part of body: Knee     Time: [id number] PT Time Calculation (min) (ACUTE ONLY): 27 min  Charges:  $Gait Training: 8-22 mins $Therapeutic Activity: 8-22 mins                     [place] Pager [phone] Office [phone]    [facility] 05/28/2021, 4:11 PM

## 2021-05-28 NOTE — Evaluation (Signed)
Occupational Therapy Evaluation Patient Details Name: Patrick Fritz MRN: [id number] DOB: 05-22-1951 Today's Date: 05/28/2021    History of Present Illness Patient is 70 y.o. male s/p Rt TKA resection and antibiotic spacer placement on 05/26/21 due to Septic TKA. Patient is TDWB on Rt LE. PMH significant for OA, HTN, HLD, OSA, glaucoma, DVT, PE, Bil TKA in 1996, CABG in 2017.   Clinical Impression   Patient is currently requiring assistance with ADLs including minimal assist with toileting, maximum assist with LE dressing, moderate assist with bathing, and setup assist with seated grooming and UE dressing, all of which is below patient's typical baseline of being Independent.  During this evaluation, patient was limited by TDWB restrictions to RLE and pain, which has the potential to impact patient's safety and independence during functional mobility, as well as performance for ADLs. [facility] "6-clicks" Daily Activity Inpatient Short Form score of 17/24 this session. Patient lives with spouse, who is able to provide 24/7 supervision and assistance.  Patient demonstrates good rehab potential, and should benefit from continued skilled occupational therapy services while in acute care to maximize safety, independence and quality of life at home.   ?     Follow Up Recommendations  Follow surgeon's recommendation for DC plan and follow-up therapies;Other (comment);No OT follow up (Pt reports that he would like home health therapy and not outpatient.)    Equipment Recommendations  Tub/shower bench    Recommendations for Other Services       Precautions / Restrictions Precautions Precautions: Fall Required Braces or Orthoses: Knee Immobilizer - Right Knee Immobilizer - Right: On at all times Restrictions Weight Bearing Restrictions: Yes RLE Weight Bearing: Touchdown weight bearing      Mobility Bed Mobility Overal bed mobility: Needs Assistance Bed Mobility: Supine to  Sit     Supine to sit: Min assist;HOB elevated     General bed mobility comments: Pt shown use of giat belt to assist with RLE mobilization in bed, and required Min As to fully advance RLE off EOB with pt able to manage trunk and LLE.    Transfers Overall transfer level: Needs assistance Equipment used: Rolling walker (2 wheeled) Transfers: Sit to/from [facility] Sit to Stand: Min assist;From elevated surface Stand pivot transfers: Min assist       General transfer comment: bed slightly elevated, cues for TDWB status on Rt LE and pt maintained throughout power up to walker. steady in standing as needed.    Balance Overall balance assessment: History of Falls;Needs assistance (Pt reprots 3-4 falls in past 12 months during tub/shower transfers.) Sitting-balance support: Feet supported;No upper extremity supported Sitting balance-Leahy Scale: Good     Standing balance support: Bilateral upper extremity supported Standing balance-Leahy Scale: Poor Standing balance comment: Need of BUE support on RW, TDWB RLE.             High level balance activites: Side stepping High Level Balance Comments: Side stepping x 3 with RW.           ADL either performed or assessed with clinical judgement   ADL Overall ADL's : Needs assistance/impaired Eating/Feeding: Independent;Sitting   Grooming: Set up;Sitting;Wash/dry hands   Upper Body Bathing: Sitting;Set up   Lower Body Bathing: Sitting/lateral leans;Sit to/from stand;Moderate assistance   Upper Body Dressing : Set up;Sitting   Lower Body Dressing: Maximal assistance;Sit to/from stand;Sitting/lateral leans Lower Body Dressing Details (indicate cue type and reason): Ki in place. Pt able to dress LLE, but not  RT without assistance while EOB. In stnding, pt currently requires BUE support on RW and with TDWB to RLE. Toilet Transfer: Minimal assistance;RW;Stand-pivot Armed forces technical officer Details (indicate cue type and  reason): Simulated to recliner as pt declined need to void. Pt compliant with TDWB to RLE. Toileting- Clothing Manipulation and Hygiene: Minimal assistance;Sitting/lateral lean;Sit to/from Nurse, children's Details (indicate cue type and reason): Pt educated on need of tub transfer bench if entering shower in order to comply with TDWB to RLE vs sponge bathing. Pt reports that he is familiar with the device and shown picture for reference. Functional mobility during ADLs: Minimal assistance;Rolling walker       Vision Baseline Vision/History: Wears glasses Wears Glasses: Reading only Vision Assessment?: No apparent visual deficits     Perception     Praxis      Pertinent Vitals/Pain Pain Assessment: 0-10 Pain Score: 3  Pain Location: Rt knee Pain Descriptors / Indicators: Aching;Discomfort;Burning Pain Intervention(s): Limited activity within patient's tolerance;Monitored during session;Premedicated before session;Repositioned     Hand Dominance Right   Extremity/Trunk Assessment Upper Extremity Assessment Upper Extremity Assessment: Overall WFL for tasks assessed   Lower Extremity Assessment Lower Extremity Assessment: Defer to PT evaluation   Cervical / Trunk Assessment Cervical / Trunk Assessment: Normal   Communication Communication Communication: No difficulties   Cognition Arousal/Alertness: Awake/alert Behavior During Therapy: WFL for tasks assessed/performed Overall Cognitive Status: Within Functional Limits for tasks assessed                                     General Comments       Exercises     Shoulder Instructions      Home Living     Available Help at Discharge: Available 24 hours/day               Bathroom Shower/Tub: Tub/shower unit;Curtain         Home Equipment: Environmental consultant - 2 wheels;Bedside commode;Hand held shower head          Prior Functioning/Environment Level of Independence: Independent         Comments: Keeps up with 5 acres with various fruit trees and pigs. Planning on moving to a home with 8 acres and "3 walk in showers".        OT Problem List: Pain;Decreased activity tolerance;Decreased knowledge of use of DME or AE;Impaired balance (sitting and/or standing)      OT Treatment/Interventions: Self-care/ADL training;Therapeutic activities;DME and/or AE instruction;Patient/family education;Balance training    OT Goals(Current goals can be found in the care plan section) Acute Rehab OT Goals Patient Stated Goal: get back independence OT Goal Formulation: With patient Time For Goal Achievement: 06/11/21 Potential to Achieve Goals: Good ADL Goals Pt Will Perform Lower Body Dressing: with adaptive equipment;with min guard assist;sitting/lateral leans;sit to/from stand Pt Will Transfer to Toilet: with modified independence;ambulating Pt Will Perform Toileting - Clothing Manipulation and hygiene: with modified independence;with adaptive equipment;sitting/lateral leans;sit to/from stand (AE as needed) Pt Will Perform Tub/Shower Transfer: tub bench;with supervision (Simulated as needed to learn positioning and use of bench) Additional ADL Goal #1: Pt will engage in at least 5 min standing functional activities without loss of balance, while compliant with TDWB to RLE in order to demonstrate improved activity tolerance and balance needed to perform ADLs safely at home.  OT Frequency: Min 2X/week   Barriers to D/C: Inaccessible home environment  2+2 stairs to enter.       Co-evaluation              AM-PAC OT "6 Clicks" Daily Activity     Outcome Measure Help from another person eating meals?: None Help from another person taking care of personal grooming?: A Little Help from another person toileting, which includes using toliet, bedpan, or urinal?: A Little Help from another person bathing (including washing, rinsing, drying)?: A Lot Help from another person to put on  and taking off regular upper body clothing?: A Little Help from another person to put on and taking off regular lower body clothing?: A Lot 6 Click Score: 17   End of Session Equipment Utilized During Treatment: Gait belt;Rolling walker;Right knee immobilizer Nurse Communication: Mobility status  Activity Tolerance: Patient tolerated treatment well Patient left: in chair;with call bell/phone within reach  OT Visit Diagnosis: Unsteadiness on feet (R26.81);History of falling (Z91.81);Repeated falls (R29.6)                Time: [id number] OT Time Calculation (min): 24 min Charges:  OT General Charges $OT Visit: 1 Visit OT Evaluation $OT Eval Low Complexity: 1 Low OT Treatments $Therapeutic Activity: 8-22 mins  Anderson [place], [place] Office: [phone] 05/28/2021  Julien Girt 05/28/2021, 11:06 AM

## 2021-05-28 NOTE — Progress Notes (Signed)
Spoke with CSW and RN re PICC placement.  HH not set up for today , plan on PICC placement later today or Sunday.

## 2021-05-28 NOTE — TOC Progression Note (Addendum)
Transition of Care [facility]) - Progression Note    Patient Details  Name: Patrick Fritz MRN: [id number] Date of Birth: 1951-08-28  [facility] of [facility]) CM/SW Contact  Purcell Mouton, RN Phone Number: 05/28/2021, 10:44 AM  Clinical Narrative:    Pt will need Wound Vac Form completed by ordering MD. Forms on Chart. Also at present time unable to find a [employer] agency to service pt at home with [facility] for wound Vac and IV ABX.    Expected Discharge Plan: Home/Self Care Barriers to Discharge: No Barriers Identified  Expected Discharge Plan and Services Expected Discharge Plan: Home/Self Care       Living arrangements for the past 2 months: Single Family Home                                       Social Determinants of Health (SDOH) Interventions    Readmission Risk Interventions No flowsheet data found.

## 2021-05-28 NOTE — TOC Progression Note (Signed)
Transition of Care [facility]) - Progression Note    Patient Details  Name: Patrick Fritz MRN: [id number] Date of Birth: [birth year]  Transition of [facility]) CM/SW Contact  Purcell Mouton, RN Phone Number: 05/28/2021, 2:08 PM  Clinical Narrative:    Pt will go home on [place]. Will not need Wound Vac form filled out. Thank you [place], [place]. Alvis Lemmings will follow pt with Jeannene Patella, RN for IV ABX.    Expected Discharge Plan: Home/Self Care Barriers to Discharge: No Barriers Identified  Expected Discharge Plan and Services Expected Discharge Plan: Home/Self Care       Living arrangements for the past 2 months: Single Family Home                                       Social Determinants of Health (SDOH) Interventions    Readmission Risk Interventions No flowsheet data found.

## 2021-05-28 NOTE — Progress Notes (Signed)
Physical Therapy Treatment Patient Details Name: Patrick Fritz MRN: [id number] DOB: [birth year] Today's Date: 05/28/2021    History of Present Illness Patient is 70 y.o. male s/p Rt TKA resection and antibiotic spacer placement on 05/26/21 due to Septic TKA. Patient is TDWB on Rt LE. PMH significant for OA, HTN, HLD, OSA, glaucoma, DVT, PE, Bil TKA in 1996, CABG in 2017.    PT Comments    Pt very cooperative and progressing with mobility but continues limited by TDWB  on R LE - related balance deficits and fatigues easily.  Follow Up Recommendations  Follow surgeon's recommendation for DC plan and follow-up therapies     Equipment Recommendations  Rolling walker with 5" wheels;3in1 (PT)    Recommendations for Other Services       Precautions / Restrictions Precautions Precautions: Fall Required Braces or Orthoses: Knee Immobilizer - Right Knee Immobilizer - Right: On at all times Restrictions Weight Bearing Restrictions: Yes RLE Weight Bearing: Touchdown weight bearing    Mobility  Bed Mobility Overal bed mobility: Needs Assistance Bed Mobility: Supine to Sit     Supine to sit: Min assist;HOB elevated     General bed mobility comments: Pt up in chair and requests back to same    Transfers Overall transfer level: Needs assistance Equipment used: Rolling walker (2 wheeled) Transfers: Sit to/from Stand Sit to Stand: Min assist;Mod assist Stand pivot transfers: Min assist       General transfer comment: cues for LE management and use of UEs to self assist  Ambulation/Gait Ambulation/Gait assistance: Min assist Gait Distance (Feet): 28 Feet (and additional 15') Assistive device: Rolling walker (2 wheeled) Gait Pattern/deviations: Step-to pattern;Decreased stride length;Decreased weight shift to right Gait velocity: decr   General Gait Details: cues for sequence, posture and TDWB   Stairs             Wheelchair Mobility    Modified Rankin (Stroke  Patients Only)       Balance Overall balance assessment: History of Falls;Needs assistance Sitting-balance support: Feet supported;No upper extremity supported Sitting balance-Leahy Scale: Good     Standing balance support: Bilateral upper extremity supported Standing balance-Leahy Scale: Poor Standing balance comment: Need of BUE support on RW, TDWB RLE.             High level balance activites: Side stepping High Level Balance Comments: Side stepping x 3 with RW.            Cognition Arousal/Alertness: Awake/alert Behavior During Therapy: WFL for tasks assessed/performed Overall Cognitive Status: Within Functional Limits for tasks assessed                                        Exercises General Exercises - Lower Extremity Ankle Circles/Pumps: AROM;Both;15 reps;Supine    General Comments        Pertinent Vitals/Pain Pain Assessment: 0-10 Pain Score: 3  Pain Location: Rt knee Pain Descriptors / Indicators: Aching;Discomfort;Burning Pain Intervention(s): Limited activity within patient's tolerance;Monitored during session;Premedicated before session    Home Living     Available Help at Discharge: Available 24 hours/day         Home Equipment: Gilford Rile - 2 wheels;Bedside commode;Hand held shower head      Prior Function Level of Independence: Independent      Comments: Keeps up with 5 acres with various fruit trees and pigs. Planning on moving to a  home with 8 acres and "3 walk in showers".   PT Goals (current goals can now be found in the care plan section) Acute Rehab PT Goals Patient Stated Goal: get back independence PT Goal Formulation: With patient Time For Goal Achievement: 06/03/21 Potential to Achieve Goals: Good Progress towards PT goals: Progressing toward goals    Frequency    Min 5X/week      PT Plan Current plan remains appropriate    Co-evaluation              AM-PAC PT "6 Clicks" Mobility   Outcome  Measure  Help needed turning from your back to your side while in a flat bed without using bedrails?: A Little Help needed moving from lying on your back to sitting on the side of a flat bed without using bedrails?: A Little Help needed moving to and from a bed to a chair (including a wheelchair)?: A Little Help needed standing up from a chair using your arms (e.g., wheelchair or bedside chair)?: A Little Help needed to walk in hospital room?: A Little Help needed climbing 3-5 steps with a railing? : A Lot 6 Click Score: 17    End of Session Equipment Utilized During Treatment: Gait belt;Right knee immobilizer Activity Tolerance: Patient tolerated treatment well Patient left: in chair;with call bell/phone within reach;with chair alarm set Nurse Communication: Mobility status;Weight bearing status PT Visit Diagnosis: Muscle weakness (generalized) (M62.81);Difficulty in walking, not elsewhere classified (R26.2);Pain Pain - Right/Left: Right Pain - part of body: Knee     Time: [id number] PT Time Calculation (min) (ACUTE ONLY): 29 min  Charges:  $Gait Training: 23-37 mins                     [place] Pager [phone] Office [phone]    Patrick Fritz 05/28/2021, 12:25 PM

## 2021-05-28 NOTE — Progress Notes (Signed)
    Subjective:  Patient reports pain as mild.  Denies N/V/CP/SOB. Working with therapy  Objective:   VITALS:   Vitals:   05/27/21 0647 05/27/21 1404 05/27/21 2125 05/28/21 0513  BP: 117/67 115/69 121/64 123/68  Pulse: 68 67 70 67  Resp: '20 18 20 18  '$ Temp: 98.2 F (36.8 C) 98.5 F (36.9 C) 99.7 F (37.6 C) 98.3 F (36.8 C)  TempSrc: Oral Oral Oral Oral  SpO2: 100% 97% 98% 93%  Weight:      Height:        NAD ABD soft Neurovascular intact Sensation intact distally Intact pulses distally Dorsiflexion/Plantar flexion intact Incision: Prevena dressing in place. Hemovac in place   Lab Results  Component Value Date   WBC 13.2 (H) 05/28/2021   HGB 10.8 (L) 05/28/2021   HCT 34.5 (L) 05/28/2021   MCV 92.7 05/28/2021   PLT 438 (H) 05/28/2021   BMET    Component Value Date/Time   NA 135 05/27/2021 0425   NA 139 04/07/2021 0815   K 4.7 05/27/2021 0425   CL 102 05/27/2021 0425   CO2 24 05/27/2021 0425   GLUCOSE 146 (H) 05/27/2021 0425   BUN 18 05/27/2021 0425   BUN 13 04/07/2021 0815   CREATININE 0.86 05/27/2021 0425   CREATININE 1.02 06/08/2016 1041   CALCIUM 8.6 (L) 05/27/2021 0425   GFRNONAA >60 05/27/2021 0425   GFRAA 99 10/21/2020 0932     Assessment/Plan: 2 Days Post-Op   Active Problems:   Septic arthritis of knee, right ([place])   TDWB RLE DVT ppx: Xarelto, SCDs, TEDS PO pain control PT/OT Strep anginosus in joint fluid culture. 2g IV ceftriaxone q 24.  PICC line to be placed prior to discharge Prevena dressing should be changed from house vac to Prevena portable vac at discharge Hemovac: D/C once output 30cc per shift or less.  Dispo: Pending.  Will need follow up with Dr.Swinteck 1 week post discharge for incisional vac removal.   Patrick Fritz 05/28/2021, 11:23 AM [place] [facility] now [employer] [address], [place], [place], [place] [address] Phone: [phone] [email or web address]   [employer]

## 2021-05-29 LAB — CULTURE, BLOOD (ROUTINE X 2)
Culture: NO GROWTH
Special Requests: ADEQUATE

## 2021-05-29 MED ORDER — SODIUM CHLORIDE 0.9% FLUSH: 10.0000 mL | INTRAVENOUS | Status: DC | PRN

## 2021-05-29 MED ORDER — CHLORHEXIDINE GLUCONATE CLOTH 2 % EX PADS
6.0000 | MEDICATED_PAD | Freq: Every day | CUTANEOUS | Status: DC
  Administered 2021-05-29: 6 via TOPICAL

## 2021-05-29 MED ORDER — CHLORHEXIDINE GLUCONATE CLOTH 2 % EX PADS: 6.0000 | MEDICATED_PAD | Freq: Every day | CUTANEOUS | Status: DC

## 2021-05-29 MED ORDER — CEFTRIAXONE IV (FOR PTA / DISCHARGE USE ONLY): 2.0000 g | INTRAVENOUS | 0 refills | Status: AC

## 2021-05-29 NOTE — Progress Notes (Signed)
PROGRESS NOTE    Patrick Fritz  [id number] DOB: 06-Oct-1951 DOA: 05/24/2021 PCP: Mayra Neer, MD   Brief Narrative: Patrick Fritz is a 70 y.o. male with PMH of bilateral TKA, systolic CHF, CAD/STEMI s/p CABG in 2017, PE/DVT on Xarelto, HTN, anxiety, PTSD, osteoarthritis/radiculopathy and OSA not on CPAP presenting with right knee pain and swelling.   Patient reports worsening of chronic right knee pain over the last 2 weeks.  He presented to ED on 7/29 and had an x-ray and venous Doppler that showed knee joint effusion with loose body in Baker's cyst.  He was discharged home to follow-up with orthopedic surgery.  He was seen at [facility] 6 days ago and had arthrocentesis.  He returned to [facility] for follow-up today, and directed to hospital with concern for septic arthritis.  Synovial fluid from [facility] with 79,000 nucleated cells with 93.5% neutrophils and GPC in pairs.    Patient has chills but no fever.  Right knee feels hot at times.  He rates his pain 9/10 when he was at [facility] but improved to 7/10 here without medication.  He also noted swelling and limited range of motion due to pain.  He denies numbness or tingling.  He denies chest pain at rest or with exertion, cough, nausea, vomiting or UTI symptoms other than nocturnal polyuria.  He reports "a little" shortness of breath unchanged from baseline.  Last dose on his Xarelto was the evening of 8/8.  He has bilateral TKA was 26 years ago.  He denies history of diabetes.  Patient stated that his penicillin allergy was when he was very young.  He reports using penicillin prophylactically during dental procedures. Patient lives with his wife.  Denies smoking cigarette.  Admits to social alcohol.  Denies recreational drug use.  Prefers to remain full code.   On arrival, vital signs within normal.  ALP 422.  WBC 10.4.  Hgb 11.3.  Platelet 579.  Otherwise, CBC and CMP without significant finding.  CRP elevated to 32.2.   Lactic acid 1.3.  INR 1.4.  APTT 42.  COVID-19 and influenza PCR nonreactive.  Blood cultures obtained.  Patient was started on IV vancomycin for septic arthritis of right knee and IV heparin for chronic PE/DVT.  Per patient, Dr. Lyla Glassing from [facility] to do arthroscopic washout of his right knee on 8/11.  Assessment & Plan:   Active Problems:   Septic arthritis of knee, right (HCC)   Infection of total knee replacement (HCC)  #1  Right knee septic arthritis -on Rocephin 2 g IV daily.  Apparently the culture that was done in the office from the joint fluid is growing strep anginous.  ID recommending ceftriaxone 2 g daily for 4 weeks.  Await PICC line placement. He had resection of the right total knee arthroplasty with placement of articulating Prostalac spacer 05/26/2021  #2 history of chronic PE and DVT on Xarelto at home.  This has been on hold continue IV heparin perioperatively.  #3 history of CAD CABG and systolic CHF-continue home meds including Coreglosartan, Aldactone and Farxiga -Strict intake and output and daily weight  #4HTN -blood pressure still soft 115/69 continue to hold Aldactone.    Estimated body mass index is 30.15 kg/m as calculated from the following:   Height as of this encounter: '5\' 10"'$  (1.778 m).   Weight as of this encounter: 95.3 kg.  DVT prophylaxis: HEPARIN Code Status: FULL Family Communication: none at bed side Disposition Plan:  Status is:  Inpatient  Remains inpatient appropriate because:IV treatments appropriate due to intensity of illness or inability to take PO  Dispo: The patient is from: Home              Anticipated d/c is to: Home              Patient currently is not medically stable to d/c.   Difficult to place patient No       Consultants:  ortho  Procedures: rocephin Antimicrobials:rocephin  Subjective: He is resting in bed he has no specific complaints no events overnight reported by the nursing staff  Objective: Vitals:    05/28/21 1241 05/28/21 2053 05/29/21 0648 05/29/21 1000  BP: 129/67 (!) 134/58 114/65 136/61  Pulse: 72 75 74 75  Resp: (!) '22 19 18 18  '$ Temp: 97.9 F (36.6 C) 99.1 F (37.3 C) 98.5 F (36.9 C) 97.9 F (36.6 C)  TempSrc: Oral Oral Oral Oral  SpO2: 96%  94% 95%  Weight:      Height:        Intake/Output Summary (Last 24 hours) at 05/29/2021 1218 Last data filed at 05/29/2021 1000 Gross per 24 hour  Intake 700 ml  Output 1775 ml  Net -1075 ml    Filed Weights   05/25/21 0452 05/26/21 0520 05/26/21 0730  Weight: 95.4 kg 95.1 kg 95.3 kg    Examination:  General exam: Appears calm and comfortable  Respiratory system: Clear to auscultation. Respiratory effort normal. Cardiovascular system: S1 & S2 heard, RRR. No JVD, murmurs, rubs, gallops or clicks. No pedal edema. Gastrointestinal system: Abdomen is nondistended, soft and nontender. No organomegaly or masses felt. Normal bowel sounds heard. Central nervous system: Alert and oriented. No focal neurological deficits. Extremities: right knee swollen and covered with dressing Skin: No rashes, lesions or ulcers Psychiatry: Judgement and insight appear normal. Mood & affect appropriate.     Data Reviewed: I have personally reviewed following labs and imaging studies  CBC: Recent Labs  Lab 05/24/21 1847 05/25/21 0609 05/26/21 0356 05/27/21 0425 05/28/21 0423  WBC 10.4 7.4 8.0 13.1* 13.2*  NEUTROABS 8.2*  --   --   --   --   HGB 11.3* 10.6* 10.3* 10.4* 10.8*  HCT 35.8* 33.7* 32.3* 32.2* 34.5*  MCV 93.0 93.1 92.6 92.3 92.7  PLT 579* 480* 467* 427* 438*    Basic Metabolic Panel: Recent Labs  Lab 05/24/21 1847 05/25/21 0609 05/26/21 0356 05/27/21 0425  NA 138 139 136 135  K 4.4 4.7 4.2 4.7  CL 99 102 102 102  CO2 '26 27 25 24  '$ GLUCOSE 102* 104* 123* 146*  BUN '16 18 17 18  '$ CREATININE 1.00 0.85 0.84 0.86  CALCIUM 9.1 8.8* 8.5* 8.6*  MG 2.0 2.1  --   --   PHOS  --  3.9  --   --     GFR: Estimated  Creatinine Clearance: 89.9 mL/min (by C-G formula based on SCr of 0.86 mg/dL). Liver Function Tests: Recent Labs  Lab 05/24/21 1847 05/25/21 0609 05/26/21 0356  AST 41 31 52*  ALT 42 33 41  ALKPHOS 422* 333* 350*  BILITOT 0.6 0.5 0.4  PROT 7.7 6.9 6.7  ALBUMIN 2.7* 2.4* 2.4*    No results for input(s): LIPASE, AMYLASE in the last 168 hours. No results for input(s): AMMONIA in the last 168 hours. Coagulation Profile: Recent Labs  Lab 05/24/21 1847  INR 1.4*    Cardiac Enzymes: No results for input(s): CKTOTAL, CKMB, CKMBINDEX,  TROPONINI in the last 168 hours. BNP (last 3 results) No results for input(s): PROBNP in the last 8760 hours. HbA1C: No results for input(s): HGBA1C in the last 72 hours.  CBG: No results for input(s): GLUCAP in the last 168 hours. Lipid Profile: No results for input(s): CHOL, HDL, LDLCALC, TRIG, CHOLHDL, LDLDIRECT in the last 72 hours. Thyroid Function Tests: No results for input(s): TSH, T4TOTAL, FREET4, T3FREE, THYROIDAB in the last 72 hours. Anemia Panel: No results for input(s): VITAMINB12, FOLATE, FERRITIN, TIBC, IRON, RETICCTPCT in the last 72 hours. Sepsis Labs: Recent Labs  Lab 05/24/21 1918 05/24/21 2043  LATICACIDVEN 1.3 1.6     Recent Results (from the past 240 hour(s))  Culture, blood (routine x 2)     Status: None   Collection Time: 05/24/21  6:19 PM   Specimen: BLOOD  Result Value Ref Range Status   Specimen Description   Final    BLOOD LEFT ANTECUBITAL Performed at [facility] [address] [place], [place] [address]    Special Requests   Final    Blood Culture adequate volume BOTTLES DRAWN AEROBIC AND ANAEROBIC Performed at [facility] [address], [place], [place] [address]    Culture   Final    NO GROWTH 5 DAYS Performed at [facility], [place] [address] [place], [place] [address]    Report Status 05/29/2021 FINAL  Final  Resp Panel by RT-PCR (Flu A&B, Covid)  Nasopharyngeal Swab     Status: None   Collection Time: 05/24/21  6:35 PM   Specimen: Nasopharyngeal Swab; Nasopharyngeal(NP) swabs in vial transport medium  Result Value Ref Range Status   SARS Coronavirus 2 by RT PCR NEGATIVE NEGATIVE Final    Comment: (NOTE) SARS-CoV-2 target nucleic acids are NOT DETECTED.  The SARS-CoV-2 RNA is generally detectable in upper respiratory specimens during the acute phase of infection. The lowest concentration of SARS-CoV-2 viral copies this assay can detect is 138 copies/mL. A negative result does not preclude SARS-Cov-2 infection and should not be used as the sole basis for treatment or other patient management decisions. A negative result may occur with  improper specimen collection/handling, submission of specimen other than nasopharyngeal swab, presence of viral mutation(s) within the areas targeted by this assay, and inadequate number of viral copies(<138 copies/mL). A negative result must be combined with clinical observations, patient history, and epidemiological information. The expected result is Negative.  Fact Sheet for Patients:  EntrepreneurPulse.com.au  Fact Sheet for Healthcare Providers:  IncredibleEmployment.be  This test is no t yet approved or cleared by the [place] FDA and  has been authorized for detection and/or diagnosis of SARS-CoV-2 by FDA under an Emergency Use Authorization (EUA). This EUA will remain  in effect (meaning this test can be used) for the duration of the COVID-19 declaration under Section 564(b)(1) of the Act, 21 U.S.C.section 360bbb-3(b)(1), unless the authorization is terminated  or revoked sooner.       Influenza A by PCR NEGATIVE NEGATIVE Final   Influenza B by PCR NEGATIVE NEGATIVE Final    Comment: (NOTE) The Xpert Xpress SARS-CoV-2/FLU/RSV plus assay is intended as an aid in the diagnosis of influenza from Nasopharyngeal swab specimens and should not be  used as a sole basis for treatment. Nasal washings and aspirates are unacceptable for Xpert Xpress SARS-CoV-2/FLU/RSV testing.  Fact Sheet for Patients: EntrepreneurPulse.com.au  Fact Sheet for Healthcare Providers: IncredibleEmployment.be  This test is not yet approved or cleared by the [place]  States FDA and has been authorized for detection and/or diagnosis of SARS-CoV-2 by FDA under an Emergency Use Authorization (EUA). This EUA will remain in effect (meaning this test can be used) for the duration of the COVID-19 declaration under Section 564(b)(1) of the Act, 21 U.S.C. section 360bbb-3(b)(1), unless the authorization is terminated or revoked.  Performed at [facility], [place] [address] [place], [place] [address]   Culture, blood (routine x 2)     Status: Abnormal   Collection Time: 05/24/21  6:47 PM   Specimen: BLOOD  Result Value Ref Range Status   Specimen Description   Final    BLOOD LEFT ANTECUBITAL Performed at [facility] [address] [place], [place] [address]    Special Requests   Final    BOTTLES DRAWN AEROBIC AND ANAEROBIC Blood Culture adequate volume Performed at [facility] [address], [place], [place] [address]    Culture  Setup Time   Final    GRAM POSITIVE COCCI IN CLUSTERS AEROBIC BOTTLE ONLY Organism ID to follow CRITICAL RESULT CALLED TO, READ BACK BY AND VERIFIED WITH: Shelda Jakes PHARMD [id number] 05/25/21 A BROWNING    Culture (A)  Final    STAPHYLOCOCCUS EPIDERMIDIS THE SIGNIFICANCE OF ISOLATING THIS ORGANISM FROM A SINGLE SET OF BLOOD CULTURES WHEN MULTIPLE SETS ARE DRAWN IS UNCERTAIN. PLEASE NOTIFY THE MICROBIOLOGY DEPARTMENT WITHIN ONE WEEK IF SPECIATION AND SENSITIVITIES ARE REQUIRED. Performed at [facility], [place] [address] [place], [place] [address]    Report Status 05/27/2021 FINAL  Final  Blood Culture ID Panel (Reflexed)     Status:  Abnormal   Collection Time: 05/24/21  6:47 PM  Result Value Ref Range Status   Enterococcus faecalis NOT DETECTED NOT DETECTED Final   Enterococcus Faecium NOT DETECTED NOT DETECTED Final   Listeria monocytogenes NOT DETECTED NOT DETECTED Final   Staphylococcus species DETECTED (A) NOT DETECTED Final    Comment: CRITICAL RESULT CALLED TO, READ BACK BY AND VERIFIED WITH: Shelda Jakes PHARMD [id number] 05/25/21 A BROWNING    Staphylococcus aureus (BCID) NOT DETECTED NOT DETECTED Final   Staphylococcus epidermidis DETECTED (A) NOT DETECTED Final    Comment: CRITICAL RESULT CALLED TO, READ BACK BY AND VERIFIED WITH: Shelda Jakes PHARMD [id number] 05/25/21 A BROWNING    Staphylococcus lugdunensis NOT DETECTED NOT DETECTED Final   Streptococcus species NOT DETECTED NOT DETECTED Final   Streptococcus agalactiae NOT DETECTED NOT DETECTED Final   Streptococcus pneumoniae NOT DETECTED NOT DETECTED Final   Streptococcus pyogenes NOT DETECTED NOT DETECTED Final   A.calcoaceticus-baumannii NOT DETECTED NOT DETECTED Final   Bacteroides fragilis NOT DETECTED NOT DETECTED Final   Enterobacterales NOT DETECTED NOT DETECTED Final   Enterobacter cloacae complex NOT DETECTED NOT DETECTED Final   Escherichia coli NOT DETECTED NOT DETECTED Final   Klebsiella aerogenes NOT DETECTED NOT DETECTED Final   Klebsiella oxytoca NOT DETECTED NOT DETECTED Final   Klebsiella pneumoniae NOT DETECTED NOT DETECTED Final   Proteus species NOT DETECTED NOT DETECTED Final   Salmonella species NOT DETECTED NOT DETECTED Final   Serratia marcescens NOT DETECTED NOT DETECTED Final   Haemophilus influenzae NOT DETECTED NOT DETECTED Final   Neisseria meningitidis NOT DETECTED NOT DETECTED Final   Pseudomonas aeruginosa NOT DETECTED NOT DETECTED Final   Stenotrophomonas maltophilia NOT DETECTED NOT DETECTED Final   Candida albicans NOT DETECTED NOT DETECTED Final   Candida auris NOT DETECTED NOT DETECTED Final   Candida glabrata NOT DETECTED NOT  DETECTED Final   Candida krusei NOT DETECTED NOT DETECTED Final   Candida parapsilosis NOT DETECTED NOT DETECTED Final   Candida tropicalis NOT DETECTED NOT DETECTED Final   Cryptococcus neoformans/gattii NOT DETECTED NOT DETECTED Final   Methicillin resistance mecA/C NOT DETECTED NOT DETECTED Final    Comment: Performed at [facility], [place] [address], [place], [place] [address]  MRSA Next Gen by PCR, Nasal     Status: None   Collection Time: 05/26/21 12:51 PM   Specimen: Nasal Mucosa; Nasal Swab  Result Value Ref Range Status   MRSA by PCR Next Gen NOT DETECTED NOT DETECTED Final    Comment: (NOTE) The GeneXpert MRSA Assay (FDA approved for NASAL specimens only), is one component of a comprehensive MRSA colonization surveillance program. It is not intended to diagnose MRSA infection nor to guide or monitor treatment for MRSA infections. Test performance is not FDA approved in patients less than 3 years old. Performed at [facility], [place] [address] [place], [place] [address]           Radiology Studies: [place] EKG SITE RITE  Result Date: 05/27/2021 If Site Rite image not attached, placement could not be confirmed due to current cardiac rhythm.       Scheduled Meds:  allopurinol  300 mg Oral Daily   Chlorhexidine Gluconate Cloth  6 each Topical Daily   dapagliflozin propanediol  10 mg Oral QAC breakfast   docusate sodium  100 mg Oral BID   feeding supplement  237 mL Oral BID BM   gabapentin  100 mg Oral BID   losartan  25 mg Oral Daily   rivaroxaban  20 mg Oral Q supper   senna  1 tablet Oral BID   sertraline  150 mg Oral Daily   simvastatin  40 mg Oral QHS   Continuous Infusions:  cefTRIAXone (ROCEPHIN)  IV 2 g (05/29/21 0853)   methocarbamol (ROBAXIN) IV       LOS: 5 days    Time spent: 6 min    Georgette Shell, MD  05/29/2021, 12:18 PM

## 2021-05-29 NOTE — Progress Notes (Signed)
Peripherally Inserted Central Catheter Placement  The IV Nurse has discussed with the patient and/or persons authorized to consent for the patient, the purpose of this procedure and the potential benefits and risks involved with this procedure.  The benefits include less needle sticks, lab draws from the catheter, and the patient may be discharged home with the catheter. Risks include, but not limited to, infection, bleeding, blood clot (thrombus formation), and puncture of an artery; nerve damage and irregular heartbeat and possibility to perform a PICC exchange if needed/ordered by physician.  Alternatives to this procedure were also discussed.  Bard Power PICC patient education guide, fact sheet on infection prevention and patient information card has been provided to patient /or left at bedside.    PICC Placement Documentation  PICC Single Lumen 05/29/21 Right Brachial 39 cm 0 cm (Active)  Indication for Insertion or Continuance of Line Home intravenous therapies (PICC only) 05/29/21 0833  Exposed Catheter (cm) 0 cm 05/29/21 [id number]  Site Assessment Clean;Dry;Intact 05/29/21 0833  Line Status Flushed;Saline locked;Blood return noted 05/29/21 0833  Dressing Type Transparent 05/29/21 0833  Dressing Status Clean;Dry;Intact 05/29/21 0833  Antimicrobial disc in place? Yes 05/29/21 0833  Safety Lock Not Applicable [id number]  Line Care Connections checked and tightened 05/29/21 0833  Line Adjustment (NICU/IV Team Only) No 05/29/21 0833  Dressing Intervention New dressing 05/29/21 0833  Dressing Change Due 06/05/21 05/29/21 0833       Rolena Infante 05/29/2021, 8:34 AM

## 2021-05-29 NOTE — Plan of Care (Signed)
  Problem: Education: Goal: Knowledge of General Education information will improve Description Including pain rating scale, medication(s)/side effects and non-pharmacologic comfort measures Outcome: Progressing   

## 2021-05-29 NOTE — Discharge Summary (Signed)
Physician Discharge Summary  Patrick Fritz TGG:[id number] DOB: [birth year] DOA: 05/24/2021  PCP: Mayra Neer, MD  Admit date: 05/24/2021 Discharge date: 05/29/2021  Admitted From: Home Disposition: Home  Recommendations for Outpatient Follow-up:  Follow up with PCP in 1-2 weeks Please obtain BMP/CBC in one week Please follow up Lolita: Yes Equipment/Devices: Wound VAC  Discharge Condition: Stable CODE STATUS: Full code Diet recommendation: Carb modified Brief/Interim Summary: Patrick Fritz is a 70 y.o. male with PMH of bilateral TKA, systolic CHF, CAD/STEMI s/p CABG in 2017, PE/DVT on Xarelto, HTN, anxiety, PTSD, osteoarthritis/radiculopathy and OSA not on CPAP presenting with right knee pain and swelling.   Patient reports worsening of chronic right knee pain over the last 2 weeks.  He presented to ED on 7/29 and had an x-ray and venous Doppler that showed knee joint effusion with loose body in Baker's cyst.  He was discharged home to follow-up with orthopedic surgery.  He was seen at [facility] 6 days ago and had arthrocentesis.  He returned to [facility] for follow-up today, and directed to hospital with concern for septic arthritis.  Synovial fluid from [facility] with 79,000 nucleated cells with 93.5% neutrophils and GPC in pairs.    Patient has chills but no fever.  Right knee feels hot at times.  He rates his pain 9/10 when he was at [facility] but improved to 7/10 here without medication.  He also noted swelling and limited range of motion due to pain.  He denies numbness or tingling.  He denies chest pain at rest or with exertion, cough, nausea, vomiting or UTI symptoms other than nocturnal polyuria.  He reports "a little" shortness of breath unchanged from baseline.  Last dose on his Xarelto was the evening of 8/8.  He has bilateral TKA was 26 years ago.  He denies history of diabetes.  Patient stated that his penicillin allergy was when he was very young.  He  reports using penicillin prophylactically during dental procedures. Patient lives with his wife.  Denies smoking cigarette.  Admits to social alcohol.  Denies recreational drug use.  Prefers to remain full code.   On arrival, vital signs within normal.  ALP 422.  WBC 10.4.  Hgb 11.3.  Platelet 579.  Otherwise, CBC and CMP without significant finding.  CRP elevated to 32.2.  Lactic acid 1.3.  INR 1.4.  APTT 42.  COVID-19 and influenza PCR nonreactive.  Blood cultures obtained.  Patient was started on IV vancomycin for septic arthritis of right knee and IV heparin for chronic PE/DVT.  Per patient, Dr. Lyla Glassing from [facility] to do arthroscopic washout of his right knee on 8/11.   Discharge Diagnoses:  Active Problems:   Septic arthritis of knee, right (HCC)   Infection of total knee replacement (HCC)        Nutrition Problem: Increased nutrient needs Etiology: acute illness (septic arthritis)    Signs/Symptoms: estimated needs   #1  Right knee septic arthritis -He had resection of the right total knee arthroplasty with placement of articulating Prostalac spacer 05/26/2021 by Dr. Lyla Glassing. He was seen by ID consult Dr. Juleen China.  He was started on Rocephin 2 g IV daily as his culture was growing strep anginous.  He had PICC line placed and he was discharged home on Rocephin 2 g IV daily till June 24, 2021.   #2 history of chronic PE and DVT on Xarelto at home.   #3 history of CAD CABG and systolic CHF-continue home  meds including Coreglosartan, Aldactone and Farxiga   #4HTN -continue home meds.    Interventions: Ensure Enlive (each supplement provides 350kcal and 20 grams of protein), Magic cup, MVI  Estimated body mass index is 30.15 kg/m as calculated from the following:   Height as of this encounter: _0  (1.778 m).   Weight as of this encounter: 95.3 kg.  Discharge Instructions  Discharge Instructions     [facility] pharmacist to adjust dose for  Vancomycin, Aminoglycosides and other anti-infective therapies as requested by physician.   Complete by: As directed    [facility] to provide Cath Flo 8105m   Complete by: As directed    Administer for PICC line occlusion and as ordered by physician for other access device issues.   Anaphylaxis Kit: Provided to treat any anaphylactic reaction to the medication being provided to the patient if First Dose or when requested by physician   Complete by: As directed    Epinephrine 12105mml vial / amp: Administer 0.105m70m0.105ml4105mubcutaneously once for moderate to severe anaphylaxis, nurse to call physician and pharmacy when reaction occurs and call 911 if needed for immediate care   Diphenhydramine 50mg55mIV vial: Administer 25-50mg 46mM PRN for first dose reaction, rash, itching, mild reaction, nurse to call physician and pharmacy when reaction occurs   Sodium Chloride 0.9% NS 500ml I69mdminister if needed for hypovolemic blood pressure drop or as ordered by physician after call to physician with anaphylactic reaction   Change dressing on IV access line weekly and PRN   Complete by: As directed    Diet - low sodium heart healthy   Complete by: As directed    Discharge wound care:   Complete by: As directed    See ortho dc   Flush IV access with Sodium Chloride 0.9% and Heparin 10 units/ml or 100 units/ml   Complete by: As directed    Home infusion instructions - [facility]   Complete by: As directed    Instructions: Flush IV access with Sodium Chloride 0.9% and Heparin 10units/ml or 100units/ml   Change dressing on IV access line: Weekly and PRN   Instructions Cath Flo 2mg: Ad55mister for PICC Line occlusion and as ordered by physician for other access device   [facility] pharmacist to adjust dose for: Vancomycin, Aminoglycosides and other anti-infective therapies as requested by physician   Increase activity slowly   Complete by: As directed    Method of  administration may be changed at the discretion of home infusion pharmacist based upon assessment of the patient and/or caregiver's ability to self-administer the medication ordered   Complete by: As directed    Outpatient Parenteral Antibiotic Therapy Information Antibiotic: Ceftriaxone (Rocephin) IVPB; Indications for use: septic arthritis; End Date: 06/24/2021   Complete by: As directed    Antibiotic: Ceftriaxone (Rocephin) IVPB   Indications for use: septic arthritis   End Date: 06/24/2021      Allergies as of 05/29/2021   No Active Allergies      Medication List     STOP taking these medications    traMADol 50 MG tablet Commonly known as: ULTRAM       TAKE these medications    acetaminophen 325 MG tablet Commonly known as: TYLENOL Take 650 mg by mouth every 6 (six) hours as needed for moderate pain.   allopurinol 300 MG tablet Commonly known as: ZYLOPRIM Take 300 mg by mouth daily.   carvedilol 3.125 MG tablet Commonly  known as: COREG Take 1 tablet (3.125 mg total) by mouth 2 (two) times daily.   cefTRIAXone  IVPB Commonly known as: ROCEPHIN Inject 2 g into the vein daily for 28 days. Indication:  Septic joint First Dose: No Last Day of Therapy:  06/24/2021 Labs - Once weekly:  CBC/D and BMP, Labs - Every other week:  ESR and CRP Method of administration: IV Push Method of administration may be changed at the discretion of home infusion pharmacist based upon assessment of the patient and/or caregiver's ability to self-administer the medication ordered.   dapagliflozin propanediol 10 MG Tabs tablet Commonly known as: Farxiga Take 1 tablet (10 mg total) by mouth daily before breakfast.   diclofenac Sodium 1 % Gel Commonly known as: VOLTAREN Apply 1 application topically daily as needed for pain.   diphenhydramine-acetaminophen 25-500 MG Tabs tablet Commonly known as: TYLENOL PM Take 2 tablets by mouth at bedtime as needed (pain).   gabapentin 100 MG  capsule Commonly known as: NEURONTIN Take 100 mg by mouth 2 (two) times daily.   HYDROcodone-acetaminophen 7.5-325 MG tablet Commonly known as: NORCO Take 1-2 tablets by mouth every 4 (four) hours as needed for severe pain (pain score 7-10).   losartan 50 MG tablet Commonly known as: COZAAR Take 25 mg by mouth daily.   rivaroxaban 20 MG Tabs tablet Commonly known as: Xarelto Take 1 tablet (20 mg total) by mouth daily with supper.   sertraline 100 MG tablet Commonly known as: ZOLOFT Take 150 mg by mouth daily.   simvastatin 40 MG tablet Commonly known as: ZOCOR Take 1 tablet (40 mg total) by mouth at bedtime.   spironolactone 25 MG tablet Commonly known as: ALDACTONE Take 0.5 tablets (12.5 mg total) by mouth daily.               Durable Medical Equipment  (From admission, onward)           Start     Ordered   05/29/21 1040  For home use only DME wheelchair cushion (seat and back)  Once        05/29/21 1039   05/29/21 1040  For home use only DME Walker rolling  Once       Question Answer Comment  Walker: With [place] Wheels   Patient needs a walker to treat with the following condition Septic arthritis ([place])      05/29/21 1039   05/29/21 1037  For home use only DME standard manual wheelchair with seat cushion  Once       Comments: Patient suffers from infected right knee(septic arthritis) which impairs their ability to perform daily activities like bathing dressing toileting, in the home.  A cane crutch or walker will not resolve issue with performing activities of daily living. A wheelchair will allow patient to safely perform daily activities. Patient can safely propel the wheelchair in the home or has a caregiver who can provide assistance. Length of need 12 months . Accessories: elevating leg rests (ELRs), wheel locks, extensions and anti-tippers.   05/29/21 1038              Discharge Care Instructions  (From admission, onward)           Start      Ordered   05/29/21 0000  Change dressing on IV access line weekly and PRN  (Home infusion instructions - [facility] )        05/29/21 1245   05/29/21 0000  Discharge  wound care:       Comments: See ortho dc   05/29/21 1459            Follow-up Information     [facility] Follow up.   Specialty: Home Health Services Why: Alvis Lemmings will follow you at home for Home Health RN, IV and Wound Vac care. Contact information: [place] [address] [place] [address] [phone]         Mayra Neer, MD Follow up.   Specialty: Family Medicine Contact information: [address] [place] [address] [phone]         Belva Crome, MD .   Specialty: Cardiology Contact information: [phone] [place] [address] [phone]         Rod Can, MD Follow up.   Specialty: Orthopedic Surgery Contact information: [address] [place] [address] [phone]                No Active Allergies  Consultations: ORTHO   Procedures/Studies: US Venous Img Lower Unilateral Right  Result Date: 05/13/2021 CLINICAL DATA:  Right leg pain, redness, swelling and tenderness for 5 days. No known injury. EXAM: RIGHT LOWER EXTREMITY VENOUS DOPPLER ULTRASOUND TECHNIQUE: Gray-scale sonography with compression, as well as color and duplex ultrasound, were performed to evaluate the deep venous system(s) from the level of the common femoral vein through the popliteal and proximal calf veins. COMPARISON:  Plain films right knee today. FINDINGS: VENOUS Normal compressibility of the common femoral, superficial femoral, and popliteal veins, as well as the visualized calf veins. Visualized portions of profunda femoral vein and great saphenous vein unremarkable. No filling defects to suggest DVT on grayscale or color Doppler imaging. Doppler waveforms show normal direction of venous flow,  normal respiratory plasticity and response to augmentation. Limited views of the contralateral common femoral vein are unremarkable. OTHER Superior to the patella, there is debris containing fluid likely due to a joint effusion. Also seen is a fluid collection in the popliteal fossa measuring approximately 5.5 x 1.9 x 1.8 cm. Calcification within this collection correlates with the loose body seen on the prior plain films. Limitations: None. IMPRESSION: Negative for DVT. Fluid collection superior to the patella is likely a debris containing joint effusion. This is at the patient's reported region of pain. Baker's cyst with a loose body within it as seen on plain films today. Electronically Signed   By: Inge Rise M.D.   On: 05/13/2021 21:14   DG Knee Complete 4 Views Right  Result Date: 05/13/2021 CLINICAL DATA:  Right knee pain radiating into the leg for 1 week redness and swelling. EXAM: RIGHT KNEE - COMPLETE 4+ VIEW COMPARISON:  None. FINDINGS: Total knee arthroplasty is in place. No hardware complication or acute bony abnormality is identified. Moderate to moderately large knee joint effusion is seen. Calcification in the soft tissues posterior to the knee measuring 2.9 cm craniocaudal by 1.4 cm AP by 1.8 cm transverse is consistent with a loose body in a Baker's cyst. IMPRESSION: Knee joint effusion. Negative for acute bony or joint abnormality. Loose body in a Baker's cyst. Electronically Signed   By: Inge Rise M.D.   On: 05/13/2021 20:58   DG Knee Right Port  Result Date: 05/26/2021 CLINICAL DATA:  Knee replacement infection EXAM: PORTABLE RIGHT KNEE - 1-2 VIEW COMPARISON:  05/13/2021 FINDINGS: Cutaneous staples. Gas within the soft tissues consistent with recent surgery. Coarse calcification posterior to  the knee as before. No fracture seen. Revision of femoral prosthesis and removal of hardware from the proximal tibia with radiolucent spacer in place. Vascular calcifications. Surgical  drain at the lateral knee. IMPRESSION: Postsurgical changes of the right knee Electronically Signed   By: Donavan Foil M.D.   On: 05/26/2021 19:54   [place] EKG SITE RITE  Result Date: 05/27/2021 If Site Rite image not attached, placement could not be confirmed due to current cardiac rhythm.  (Echo, Carotid, EGD, Colonoscopy, ERCP)    Subjective: He is sitting up in chair PICC line placed this AM. Hemovac drain removed today. Portable VAC placed.  Discharge Exam: Vitals:   05/29/21 1000 05/29/21 1327  BP: 136/61 121/65  Pulse: 75 75  Resp: 18 20  Temp: 97.9 F (36.6 C) 98.5 F (36.9 C)  SpO2: 95% 98%   Vitals:   05/28/21 2053 05/29/21 0648 05/29/21 1000 05/29/21 1327  BP: (!) 134/58 114/65 136/61 121/65  Pulse: 75 74 75 75  Resp: _0 Temp: 99.1 F (37.3 C) 98.5 F (36.9 C) 97.9 F (36.6 C) 98.5 F (36.9 C)  TempSrc: Oral Oral Oral Oral  SpO2:  94% 95% 98%  Weight:      Height:        General: Pt is alert, awake, not in acute distress Cardiovascular: RRR, S1/S2 +, no rubs, no gallops Respiratory: CTA bilaterally, no wheezing, no rhonchi Abdominal: Soft, NT, ND, bowel sounds + Extremities: Right foot knee covered with dressing with VAC in place  The results of significant diagnostics from this hospitalization (including imaging, microbiology, ancillary and laboratory) are listed below for reference.     Microbiology: Recent Results (from the past 240 hour(s))  Culture, blood (routine x 2)     Status: None   Collection Time: 05/24/21  6:19 PM   Specimen: BLOOD  Result Value Ref Range Status   Specimen Description   Final    BLOOD LEFT ANTECUBITAL Performed at [facility] [address], [place], [place] [address]    Special Requests   Final    Blood Culture adequate volume BOTTLES DRAWN AEROBIC AND ANAEROBIC Performed at [facility] [address], [place], [place] [address]    Culture   Final    NO GROWTH 5  DAYS Performed at [facility], [place] [address], [place], [place] [address]    Report Status 05/29/2021 FINAL  Final  Resp Panel by RT-PCR (Flu A&B, Covid) Nasopharyngeal Swab     Status: None   Collection Time: 05/24/21  6:35 PM   Specimen: Nasopharyngeal Swab; Nasopharyngeal(NP) swabs in vial transport medium  Result Value Ref Range Status   SARS Coronavirus 2 by RT PCR NEGATIVE NEGATIVE Final    Comment: (NOTE) SARS-CoV-2 target nucleic acids are NOT DETECTED.  The SARS-CoV-2 RNA is generally detectable in upper respiratory specimens during the acute phase of infection. The lowest concentration of SARS-CoV-2 viral copies this assay can detect is 138 copies/mL. A negative result does not preclude SARS-Cov-2 infection and should not be used as the sole basis for treatment or other patient management decisions. A negative result may occur with  improper specimen collection/handling, submission of specimen other than nasopharyngeal swab, presence of viral mutation(s) within the areas targeted by this assay, and inadequate number of viral copies(<138 copies/mL). A negative result must be combined with clinical observations, patient history, and epidemiological information. The expected result is Negative.  Fact Sheet for Patients:  EntrepreneurPulse.com.au  Fact Sheet for Healthcare Providers:  IncredibleEmployment.be  This test is no t yet approved or cleared by the [place] FDA and  has been authorized for detection and/or diagnosis of SARS-CoV-2 by FDA under an Emergency Use Authorization (EUA). This EUA will remain  in effect (meaning this test can be used) for the duration of the COVID-19 declaration under Section 564(b)(1) of the Act, 21 U.S.C.section 360bbb-3(b)(1), unless the authorization is terminated  or revoked sooner.       Influenza A by PCR NEGATIVE NEGATIVE Final   Influenza B by PCR NEGATIVE NEGATIVE Final     Comment: (NOTE) The Xpert Xpress SARS-CoV-2/FLU/RSV plus assay is intended as an aid in the diagnosis of influenza from Nasopharyngeal swab specimens and should not be used as a sole basis for treatment. Nasal washings and aspirates are unacceptable for Xpert Xpress SARS-CoV-2/FLU/RSV testing.  Fact Sheet for Patients: EntrepreneurPulse.com.au  Fact Sheet for Healthcare Providers: IncredibleEmployment.be  This test is not yet approved or cleared by the [place] FDA and has been authorized for detection and/or diagnosis of SARS-CoV-2 by FDA under an Emergency Use Authorization (EUA). This EUA will remain in effect (meaning this test can be used) for the duration of the COVID-19 declaration under Section 564(b)(1) of the Act, 21 U.S.C. section 360bbb-3(b)(1), unless the authorization is terminated or revoked.  Performed at [facility], [place] [address] [place], [place] [address]   Culture, blood (routine x 2)     Status: Abnormal   Collection Time: 05/24/21  6:47 PM   Specimen: BLOOD  Result Value Ref Range Status   Specimen Description   Final    BLOOD LEFT ANTECUBITAL Performed at [facility] [address], [place], [place] [address]    Special Requests   Final    BOTTLES DRAWN AEROBIC AND ANAEROBIC Blood Culture adequate volume Performed at [facility] [address] [place], [place] [address]    Culture  Setup Time   Final    GRAM POSITIVE COCCI IN CLUSTERS AEROBIC BOTTLE ONLY Organism ID to follow CRITICAL RESULT CALLED TO, READ BACK BY AND VERIFIED WITH: Shelda Jakes PHARMD [id number] 05/25/21 A BROWNING    Culture (A)  Final    STAPHYLOCOCCUS EPIDERMIDIS THE SIGNIFICANCE OF ISOLATING THIS ORGANISM FROM A SINGLE SET OF BLOOD CULTURES WHEN MULTIPLE SETS ARE DRAWN IS UNCERTAIN. PLEASE NOTIFY THE MICROBIOLOGY DEPARTMENT WITHIN ONE WEEK IF SPECIATION AND SENSITIVITIES ARE  REQUIRED. Performed at [facility], [place] [address] [place], [place] [address]    Report Status 05/27/2021 FINAL  Final  Blood Culture ID Panel (Reflexed)     Status: Abnormal   Collection Time: 05/24/21  6:47 PM  Result Value Ref Range Status   Enterococcus faecalis NOT DETECTED NOT DETECTED Final   Enterococcus Faecium NOT DETECTED NOT DETECTED Final   Listeria monocytogenes NOT DETECTED NOT DETECTED Final   Staphylococcus species DETECTED (A) NOT DETECTED Final    Comment: CRITICAL RESULT CALLED TO, READ BACK BY AND VERIFIED WITH: Shelda Jakes PHARMD [id number] 05/25/21 A BROWNING    Staphylococcus aureus (BCID) NOT DETECTED NOT DETECTED Final   Staphylococcus epidermidis DETECTED (A) NOT DETECTED Final    Comment: CRITICAL RESULT CALLED TO, READ BACK BY AND VERIFIED WITH: Shelda Jakes PHARMD [id number] 05/25/21 A BROWNING    Staphylococcus lugdunensis NOT DETECTED NOT DETECTED Final   Streptococcus species NOT DETECTED NOT DETECTED Final   Streptococcus agalactiae NOT DETECTED NOT DETECTED Final  Streptococcus pneumoniae NOT DETECTED NOT DETECTED Final   Streptococcus pyogenes NOT DETECTED NOT DETECTED Final   A.calcoaceticus-baumannii NOT DETECTED NOT DETECTED Final   Bacteroides fragilis NOT DETECTED NOT DETECTED Final   Enterobacterales NOT DETECTED NOT DETECTED Final   Enterobacter cloacae complex NOT DETECTED NOT DETECTED Final   Escherichia coli NOT DETECTED NOT DETECTED Final   Klebsiella aerogenes NOT DETECTED NOT DETECTED Final   Klebsiella oxytoca NOT DETECTED NOT DETECTED Final   Klebsiella pneumoniae NOT DETECTED NOT DETECTED Final   Proteus species NOT DETECTED NOT DETECTED Final   Salmonella species NOT DETECTED NOT DETECTED Final   Serratia marcescens NOT DETECTED NOT DETECTED Final   Haemophilus influenzae NOT DETECTED NOT DETECTED Final   Neisseria meningitidis NOT DETECTED NOT DETECTED Final   Pseudomonas aeruginosa NOT DETECTED NOT DETECTED Final   Stenotrophomonas  maltophilia NOT DETECTED NOT DETECTED Final   Candida albicans NOT DETECTED NOT DETECTED Final   Candida auris NOT DETECTED NOT DETECTED Final   Candida glabrata NOT DETECTED NOT DETECTED Final   Candida krusei NOT DETECTED NOT DETECTED Final   Candida parapsilosis NOT DETECTED NOT DETECTED Final   Candida tropicalis NOT DETECTED NOT DETECTED Final   Cryptococcus neoformans/gattii NOT DETECTED NOT DETECTED Final   Methicillin resistance mecA/C NOT DETECTED NOT DETECTED Final    Comment: Performed at [facility], [address] [place], [place] [address]  MRSA Next Gen by PCR, Nasal     Status: None   Collection Time: 05/26/21 12:51 PM   Specimen: Nasal Mucosa; Nasal Swab  Result Value Ref Range Status   MRSA by PCR Next Gen NOT DETECTED NOT DETECTED Final    Comment: (NOTE) The GeneXpert MRSA Assay (FDA approved for NASAL specimens only), is one component of a comprehensive MRSA colonization surveillance program. It is not intended to diagnose MRSA infection nor to guide or monitor treatment for MRSA infections. Test performance is not FDA approved in patients less than 24 years old. Performed at [facility], [place] [address] [place],  [address]      Labs: BNP (last 3 results) No results for input(s): BNP in the last 8760 hours. Basic Metabolic Panel: Recent Labs  Lab 05/24/21 1847 05/25/21 0609 05/26/21 0356 05/27/21 0425  NA 138 139 136 135  K 4.4 4.7 4.2 4.7  CL 99 102 102 102  CO2 _0 GLUCOSE 102* 104* 123* 146*  BUN _1 CREATININE 1.00 0.85 0.84 0.86  CALCIUM 9.1 8.8* 8.5* 8.6*  MG 2.0 2.1  --   --   PHOS  --  3.9  --   --    Liver Function Tests: Recent Labs  Lab 05/24/21 1847 05/25/21 0609 05/26/21 0356  AST 41 31 52*  ALT 42 33 41  ALKPHOS 422* 333* 350*  BILITOT 0.6 0.5 0.4  PROT 7.7 6.9 6.7  ALBUMIN 2.7* 2.4* 2.4*   No results for input(s): LIPASE, AMYLASE in the last 168 hours. No results for  input(s): AMMONIA in the last 168 hours. CBC: Recent Labs  Lab 05/24/21 1847 05/25/21 0609 05/26/21 0356 05/27/21 0425 05/28/21 0423  WBC 10.4 7.4 8.0 13.1* 13.2*  NEUTROABS 8.2*  --   --   --   --   HGB 11.3* 10.6* 10.3* 10.4* 10.8*  HCT 35.8* 33.7* 32.3* 32.2* 34.5*  MCV 93.0 93.1 92.6 92.3 92.7  PLT 579* 480* 467* 427* 438*   Cardiac Enzymes: No results for  input(s): CKTOTAL, CKMB, CKMBINDEX, TROPONINI in the last 168 hours. BNP: Invalid input(s): POCBNP CBG: No results for input(s): GLUCAP in the last 168 hours. D-Dimer No results for input(s): DDIMER in the last 72 hours. Hgb A1c No results for input(s): HGBA1C in the last 72 hours. Lipid Profile No results for input(s): CHOL, HDL, LDLCALC, TRIG, CHOLHDL, LDLDIRECT in the last 72 hours. Thyroid function studies No results for input(s): TSH, T4TOTAL, T3FREE, THYROIDAB in the last 72 hours.  Invalid input(s): FREET3 Anemia work up No results for input(s): VITAMINB12, FOLATE, FERRITIN, TIBC, IRON, RETICCTPCT in the last 72 hours. Urinalysis    Component Value Date/Time   COLORURINE STRAW (A) 01/22/2021 0550   APPEARANCEUR CLEAR 01/22/2021 0550   LABSPEC 1.045 (H) 01/22/2021 0550   PHURINE 5.0 01/22/2021 0550   GLUCOSEU NEGATIVE 01/22/2021 0550   HGBUR NEGATIVE 01/22/2021 0550   BILIRUBINUR NEGATIVE 01/22/2021 0550   KETONESUR NEGATIVE 01/22/2021 0550   PROTEINUR NEGATIVE 01/22/2021 0550   NITRITE NEGATIVE 01/22/2021 0550   LEUKOCYTESUR NEGATIVE 01/22/2021 0550   Sepsis Labs Invalid input(s): PROCALCITONIN,  WBC,  LACTICIDVEN Microbiology Recent Results (from the past 240 hour(s))  Culture, blood (routine x 2)     Status: None   Collection Time: 05/24/21  6:19 PM   Specimen: BLOOD  Result Value Ref Range Status   Specimen Description   Final    BLOOD LEFT ANTECUBITAL Performed at [facility], [place] [address], [place], [place] [address]    Special Requests   Final    Blood Culture  adequate volume BOTTLES DRAWN AEROBIC AND ANAEROBIC Performed at [facility] [facility] [address], [place], [place] [address]    Culture   Final    NO GROWTH 5 DAYS Performed at [facility], [place] [address] [place], [place] [address]    Report Status 05/29/2021 FINAL  Final  Resp Panel by RT-PCR (Flu A&B, Covid) Nasopharyngeal Swab     Status: None   Collection Time: 05/24/21  6:35 PM   Specimen: Nasopharyngeal Swab; Nasopharyngeal(NP) swabs in vial transport medium  Result Value Ref Range Status   SARS Coronavirus 2 by RT PCR NEGATIVE NEGATIVE Final    Comment: (NOTE) SARS-CoV-2 target nucleic acids are NOT DETECTED.  The SARS-CoV-2 RNA is generally detectable in upper respiratory specimens during the acute phase of infection. The lowest concentration of SARS-CoV-2 viral copies this assay can detect is 138 copies/mL. A negative result does not preclude SARS-Cov-2 infection and should not be used as the sole basis for treatment or other patient management decisions. A negative result may occur with  improper specimen collection/handling, submission of specimen other than nasopharyngeal swab, presence of viral mutation(s) within the areas targeted by this assay, and inadequate number of viral copies(<138 copies/mL). A negative result must be combined with clinical observations, patient history, and epidemiological information. The expected result is Negative.  Fact Sheet for Patients:  EntrepreneurPulse.com.au  Fact Sheet for Healthcare Providers:  IncredibleEmployment.be  This test is no t yet approved or cleared by the [place] FDA and  has been authorized for detection and/or diagnosis of SARS-CoV-2 by FDA under an Emergency Use Authorization (EUA). This EUA will remain  in effect (meaning this test can be used) for the duration of the COVID-19 declaration under Section 564(b)(1) of the Act, 21 U.S.C.section  360bbb-3(b)(1), unless the authorization is terminated  or revoked sooner.       Influenza A by PCR NEGATIVE NEGATIVE Final   Influenza  B by PCR NEGATIVE NEGATIVE Final    Comment: (NOTE) The Xpert Xpress SARS-CoV-2/FLU/RSV plus assay is intended as an aid in the diagnosis of influenza from Nasopharyngeal swab specimens and should not be used as a sole basis for treatment. Nasal washings and aspirates are unacceptable for Xpert Xpress SARS-CoV-2/FLU/RSV testing.  Fact Sheet for Patients: EntrepreneurPulse.com.au  Fact Sheet for Healthcare Providers: IncredibleEmployment.be  This test is not yet approved or cleared by the [place] FDA and has been authorized for detection and/or diagnosis of SARS-CoV-2 by FDA under an Emergency Use Authorization (EUA). This EUA will remain in effect (meaning this test can be used) for the duration of the COVID-19 declaration under Section 564(b)(1) of the Act, 21 U.S.C. section 360bbb-3(b)(1), unless the authorization is terminated or revoked.  Performed at [facility], [place] [address], [place], [place] [address]   Culture, blood (routine x 2)     Status: Abnormal   Collection Time: 05/24/21  6:47 PM   Specimen: BLOOD  Result Value Ref Range Status   Specimen Description   Final    BLOOD LEFT ANTECUBITAL Performed at [facility] [address], [place], [place] [address]    Special Requests   Final    BOTTLES DRAWN AEROBIC AND ANAEROBIC Blood Culture adequate volume Performed at [facility] [address], [place], [place] [address]    Culture  Setup Time   Final    GRAM POSITIVE COCCI IN CLUSTERS AEROBIC BOTTLE ONLY Organism ID to follow CRITICAL RESULT CALLED TO, READ BACK BY AND VERIFIED WITH: Shelda Jakes PHARMD [id number] 05/25/21 A BROWNING    Culture (A)  Final    STAPHYLOCOCCUS EPIDERMIDIS THE SIGNIFICANCE OF ISOLATING THIS ORGANISM FROM  A SINGLE SET OF BLOOD CULTURES WHEN MULTIPLE SETS ARE DRAWN IS UNCERTAIN. PLEASE NOTIFY THE MICROBIOLOGY DEPARTMENT WITHIN ONE WEEK IF SPECIATION AND SENSITIVITIES ARE REQUIRED. Performed at [facility], [place] [address] [place], [place] [address]    Report Status 05/27/2021 FINAL  Final  Blood Culture ID Panel (Reflexed)     Status: Abnormal   Collection Time: 05/24/21  6:47 PM  Result Value Ref Range Status   Enterococcus faecalis NOT DETECTED NOT DETECTED Final   Enterococcus Faecium NOT DETECTED NOT DETECTED Final   Listeria monocytogenes NOT DETECTED NOT DETECTED Final   Staphylococcus species DETECTED (A) NOT DETECTED Final    Comment: CRITICAL RESULT CALLED TO, READ BACK BY AND VERIFIED WITH: Shelda Jakes PHARMD [id number] 05/25/21 A BROWNING    Staphylococcus aureus (BCID) NOT DETECTED NOT DETECTED Final   Staphylococcus epidermidis DETECTED (A) NOT DETECTED Final    Comment: CRITICAL RESULT CALLED TO, READ BACK BY AND VERIFIED WITH: Shelda Jakes PHARMD [id number] 05/25/21 A BROWNING    Staphylococcus lugdunensis NOT DETECTED NOT DETECTED Final   Streptococcus species NOT DETECTED NOT DETECTED Final   Streptococcus agalactiae NOT DETECTED NOT DETECTED Final   Streptococcus pneumoniae NOT DETECTED NOT DETECTED Final   Streptococcus pyogenes NOT DETECTED NOT DETECTED Final   A.calcoaceticus-baumannii NOT DETECTED NOT DETECTED Final   Bacteroides fragilis NOT DETECTED NOT DETECTED Final   Enterobacterales NOT DETECTED NOT DETECTED Final   Enterobacter cloacae complex NOT DETECTED NOT DETECTED Final   Escherichia coli NOT DETECTED NOT DETECTED Final   Klebsiella aerogenes NOT DETECTED NOT DETECTED Final   Klebsiella oxytoca NOT DETECTED NOT DETECTED Final   Klebsiella pneumoniae NOT DETECTED NOT DETECTED Final   Proteus species NOT DETECTED NOT DETECTED Final  Salmonella species NOT DETECTED NOT DETECTED Final   Serratia marcescens NOT DETECTED NOT DETECTED Final   Haemophilus influenzae NOT  DETECTED NOT DETECTED Final   Neisseria meningitidis NOT DETECTED NOT DETECTED Final   Pseudomonas aeruginosa NOT DETECTED NOT DETECTED Final   Stenotrophomonas maltophilia NOT DETECTED NOT DETECTED Final   Candida albicans NOT DETECTED NOT DETECTED Final   Candida auris NOT DETECTED NOT DETECTED Final   Candida glabrata NOT DETECTED NOT DETECTED Final   Candida krusei NOT DETECTED NOT DETECTED Final   Candida parapsilosis NOT DETECTED NOT DETECTED Final   Candida tropicalis NOT DETECTED NOT DETECTED Final   Cryptococcus neoformans/gattii NOT DETECTED NOT DETECTED Final   Methicillin resistance mecA/C NOT DETECTED NOT DETECTED Final    Comment: Performed at [facility], [address] [place], [place] [address]  MRSA Next Gen by PCR, Nasal     Status: None   Collection Time: 05/26/21 12:51 PM   Specimen: Nasal Mucosa; Nasal Swab  Result Value Ref Range Status   MRSA by PCR Next Gen NOT DETECTED NOT DETECTED Final    Comment: (NOTE) The GeneXpert MRSA Assay (FDA approved for NASAL specimens only), is one component of a comprehensive MRSA colonization surveillance program. It is not intended to diagnose MRSA infection nor to guide or monitor treatment for MRSA infections. Test performance is not FDA approved in patients less than 68 years old. Performed at [facility], [place] [address], [place], [place] [address]      Time coordinating discharge: 39 minutes  SIGNED:   Georgette Shell, MD  [facility] 05/29/2021, 4:03 PM

## 2021-05-29 NOTE — Plan of Care (Signed)
  Problem: Education: Goal: Knowledge of General Education information will improve Description: Including pain rating scale, medication(s)/side effects and non-pharmacologic comfort measures 05/29/2021 1505 by Sanjuana Mae, RN Outcome: Adequate for Discharge 05/29/2021 0951 by Sanjuana Mae, RN Outcome: Progressing

## 2021-05-29 NOTE — TOC Transition Note (Signed)
Transition of Care [facility]) - CM/SW Discharge Note   Patient Details  Name: Patrick Fritz MRN: [id number] Date of Birth: 1951-08-15  [facility] CM/SW Contact:  Lennart Pall, LCSW Phone Number: 05/29/2021, 2:44 PM   Clinical Narrative:    Alerted by MD that pt has been medically cleared for dc.  Have confirmed with Carolynn Sayers, RN (with [employer]) that all teaching for home IV abx was completed Friday.  Confirmed with both Pam and with [employer] rep that [facility] set to start services tomorrow.  RN has obtained the Prevena VAC to send pt home with and all DME has been delivered to pt's room. No further TOC needs.   Final next level of care: [place] Barriers to Discharge: Barriers Resolved   Patient Goals and CMS Choice Patient states their goals for this hospitalization and ongoing recovery are:: To get better CMS Medicare.gov Compare Post Acute Care list provided to:: Patient Choice offered to / list presented to : Patient  Discharge Placement                       Discharge Plan and Services                DME Arranged: Lightweight manual wheelchair with seat cushion, Walker rolling DME Agency: [employer] Date DME Agency Contacted: 05/29/21 Time DME Agency Contacted: [id number] Representative spoke with at DME Agency: [place]: RN, OT, PT, IV Antibiotics HH Agency: [facility], [employer] Date [place]: 05/28/21   Representative spoke with at [place]: Meredeth Ide; [employer] - Pam  Social Determinants of Health (SDOH) Interventions     Readmission Risk Interventions No flowsheet data found.

## 2021-05-29 NOTE — Progress Notes (Signed)
Physical Therapy Treatment Patient Details Name: Patrick Fritz MRN: [id number] DOB: 09/19/1951 Today's Date: 05/29/2021    History of Present Illness Patient is 70 y.o. male s/p Rt TKA resection and antibiotic spacer placement on 05/26/21 due to Septic TKA. Patient is TDWB on Rt LE. PMH significant for OA, HTN, HLD, OSA, glaucoma, DVT, PE, Bil TKA in 1996, CABG in 2017.    PT Comments    Pt continues very motivated and with c/o mild transient dizziness with move to standing.  BP supine 130/71, sit 121/69, stand 82/64, stand x 3 min 102/72 and after walking 109/78.  Pt performed limited therex program with R LE in [place], ambulated increased distance in hall, and negotiated stairs with written instruction provided.  Pt hopeful for dc home this date.   Follow Up Recommendations  Home health PT     Equipment Recommendations  Rolling walker with 5" wheels;3in1 (PT);Wheelchair (measurements PT)    Recommendations for Other Services       Precautions / Restrictions Precautions Precautions: Fall Required Braces or Orthoses: Knee Immobilizer - Right Knee Immobilizer - Right: On at all times Restrictions Weight Bearing Restrictions: Yes RLE Weight Bearing: Touchdown weight bearing    Mobility  Bed Mobility               General bed mobility comments: Pt up in chair and returned to same    Transfers Overall transfer level: Needs assistance Equipment used: Rolling walker (2 wheeled) Transfers: Sit to/from Stand Sit to Stand: Min assist         General transfer comment: cues for LE management and use of UEs to self assist  Ambulation/Gait Ambulation/Gait assistance: Min guard Gait Distance (Feet): 35 Feet (and additional 15') Assistive device: Rolling walker (2 wheeled) Gait Pattern/deviations: Step-to pattern;Decreased stride length;Decreased weight shift to right Gait velocity: decr   General Gait Details: cues for sequence, posture and TDWB; c/o transient dizziness  only with move to standing   Stairs Stairs: Yes Stairs assistance: Min assist;+2 safety/equipment Stair Management: No rails;Step to pattern;Backwards;With walker Number of Stairs: 2 General stair comments: cues for sequence and foot/RW placement   Wheelchair Mobility    Modified Rankin (Stroke Patients Only)       Balance Overall balance assessment: History of Falls;Needs assistance Sitting-balance support: Feet supported;No upper extremity supported Sitting balance-Leahy Scale: Good     Standing balance support: Single extremity supported Standing balance-Leahy Scale: Fair Standing balance comment: Need of at least one UE support on RW, TDWB RLE.                            Cognition Arousal/Alertness: Awake/alert Behavior During Therapy: WFL for tasks assessed/performed Overall Cognitive Status: Within Functional Limits for tasks assessed                                        Exercises General Exercises - Lower Extremity Ankle Circles/Pumps: AROM;Both;15 reps;Supine Quad Sets: AROM;Both;10 reps;Supine Hip ABduction/ADduction: AAROM;Right;15 reps;Supine    General Comments        Pertinent Vitals/Pain Pain Assessment: 0-10 Pain Score: 3  Pain Location: Rt knee Pain Descriptors / Indicators: Aching;Discomfort;Burning Pain Intervention(s): Limited activity within patient's tolerance;Monitored during session    Home Living  Prior Function            PT Goals (current goals can now be found in the care plan section) Acute Rehab PT Goals Patient Stated Goal: get back independence PT Goal Formulation: With patient Time For Goal Achievement: 06/03/21 Potential to Achieve Goals: Good Progress towards PT goals: Progressing toward goals    Frequency    Min 5X/week      PT Plan Current plan remains appropriate    Co-evaluation              AM-PAC PT "6 Clicks" Mobility   Outcome  Measure  Help needed turning from your back to your side while in a flat bed without using bedrails?: A Little Help needed moving from lying on your back to sitting on the side of a flat bed without using bedrails?: A Little Help needed moving to and from a bed to a chair (including a wheelchair)?: A Little Help needed standing up from a chair using your arms (e.g., wheelchair or bedside chair)?: A Little Help needed to walk in hospital room?: A Little Help needed climbing 3-5 steps with a railing? : A Little 6 Click Score: 18    End of Session Equipment Utilized During Treatment: Gait belt;Right knee immobilizer Activity Tolerance: Patient tolerated treatment well Patient left: in chair;with call bell/phone within reach;with chair alarm set Nurse Communication: Mobility status;Weight bearing status PT Visit Diagnosis: Muscle weakness (generalized) (M62.81);Difficulty in walking, not elsewhere classified (R26.2);Pain Pain - Right/Left: Right Pain - part of body: Knee     Time: [id number] PT Time Calculation (min) (ACUTE ONLY): 39 min  Charges:  $Gait Training: 8-22 mins $Therapeutic Exercise: 8-22 mins $Therapeutic Activity: 8-22 mins                     Debe Coder PT Acute Rehabilitation Services Pager [phone] Office [phone]    Bethan Adamek 05/29/2021, 2:35 PM

## 2021-05-29 NOTE — Progress Notes (Signed)
Subjective: 3 Days Post-Op Procedure(s) (LRB): EXCISIONAL TOTAL KNEE ARTHROPLASTY WITH ANTIBIOTIC SPACERS (Right) Patient seen in rounds for Dr. Lyla Glassing Patient reports pain as 3 on 0-10 scale.   Doing well PICC line placed this am   Objective: Vital signs in last 24 hours: Temp:  [97.9 F (36.6 C)-99.1 F (37.3 C)] 98.5 F (36.9 C) (08/14 0648) Pulse Rate:  [72-75] 74 (08/14 0648) Resp:  [18-22] 18 (08/14 0648) BP: (114-134)/(58-67) 114/65 (08/14 0648) SpO2:  [94 %-96 %] 94 % (08/14 0648)  Intake/Output from previous day: 08/13 0701 - 08/14 0700 In: 480 [P.O.:480] Out: 1475 [Urine:1450; Drains:25] Intake/Output this shift: Total I/O In: 460 [P.O.:360; IV Piggyback:100] Out: -   Recent Labs    05/27/21 0425 05/28/21 0423  HGB 10.4* 10.8*   Recent Labs    05/27/21 0425 05/28/21 0423  WBC 13.1* 13.2*  RBC 3.49* 3.72*  HCT 32.2* 34.5*  PLT 427* 438*   Recent Labs    05/27/21 0425  NA 135  K 4.7  CL 102  CO2 24  BUN 18  CREATININE 0.86  GLUCOSE 146*  CALCIUM 8.6*   No results for input(s): LABPT, INR in the last 72 hours.  Neurologically intact Neurovascular intact Sensation intact distally Intact pulses distally Dorsiflexion/Plantar flexion intact Incision: dressing C/D/I No cellulitis present Compartment soft   Assessment/Plan: 3 Days Post-Op Procedure(s) (LRB): EXCISIONAL TOTAL KNEE ARTHROPLASTY WITH ANTIBIOTIC SPACERS (Right) Advance diet Up with therapy TDWB DVT ppx: Xarelto, SCDs, TEDs PO pain control PT/OT Hemovac drain removed today, less than 30 total yesterday Needs portable vac placed prior to d/c Okay from ortho standpoint for d/c  Needs to follow up with Dr. Lyla Glassing in 1 week    [place] 05/29/2021, 9:59 AM

## 2021-05-30 DIAGNOSIS — M00861 Arthritis due to other bacteria, right knee: Secondary | ICD-10-CM | POA: Diagnosis not present

## 2021-05-31 ENCOUNTER — Encounter (HOSPITAL_COMMUNITY): Payer: Self-pay | Admitting: Orthopedic Surgery

## 2021-05-31 DIAGNOSIS — I110 Hypertensive heart disease with heart failure: Secondary | ICD-10-CM | POA: Diagnosis not present

## 2021-05-31 DIAGNOSIS — T8453XA Infection and inflammatory reaction due to internal right knee prosthesis, initial encounter: Secondary | ICD-10-CM | POA: Diagnosis not present

## 2021-05-31 DIAGNOSIS — I5022 Chronic systolic (congestive) heart failure: Secondary | ICD-10-CM | POA: Diagnosis not present

## 2021-05-31 DIAGNOSIS — I951 Orthostatic hypotension: Secondary | ICD-10-CM | POA: Diagnosis not present

## 2021-05-31 DIAGNOSIS — M009 Pyogenic arthritis, unspecified: Secondary | ICD-10-CM | POA: Diagnosis not present

## 2021-06-01 DIAGNOSIS — T8453XA Infection and inflammatory reaction due to internal right knee prosthesis, initial encounter: Secondary | ICD-10-CM | POA: Diagnosis not present

## 2021-06-01 DIAGNOSIS — I110 Hypertensive heart disease with heart failure: Secondary | ICD-10-CM | POA: Diagnosis not present

## 2021-06-01 DIAGNOSIS — I951 Orthostatic hypotension: Secondary | ICD-10-CM | POA: Diagnosis not present

## 2021-06-01 DIAGNOSIS — M009 Pyogenic arthritis, unspecified: Secondary | ICD-10-CM | POA: Diagnosis not present

## 2021-06-01 DIAGNOSIS — I5022 Chronic systolic (congestive) heart failure: Secondary | ICD-10-CM | POA: Diagnosis not present

## 2021-06-03 DIAGNOSIS — I951 Orthostatic hypotension: Secondary | ICD-10-CM | POA: Diagnosis not present

## 2021-06-03 DIAGNOSIS — M009 Pyogenic arthritis, unspecified: Secondary | ICD-10-CM | POA: Diagnosis not present

## 2021-06-03 DIAGNOSIS — I110 Hypertensive heart disease with heart failure: Secondary | ICD-10-CM | POA: Diagnosis not present

## 2021-06-03 DIAGNOSIS — I5022 Chronic systolic (congestive) heart failure: Secondary | ICD-10-CM | POA: Diagnosis not present

## 2021-06-03 DIAGNOSIS — T8453XA Infection and inflammatory reaction due to internal right knee prosthesis, initial encounter: Secondary | ICD-10-CM | POA: Diagnosis not present

## 2021-06-06 DIAGNOSIS — I5022 Chronic systolic (congestive) heart failure: Secondary | ICD-10-CM | POA: Diagnosis not present

## 2021-06-06 DIAGNOSIS — M0080 Arthritis due to other bacteria, unspecified joint: Secondary | ICD-10-CM | POA: Diagnosis not present

## 2021-06-06 DIAGNOSIS — T8453XA Infection and inflammatory reaction due to internal right knee prosthesis, initial encounter: Secondary | ICD-10-CM | POA: Diagnosis not present

## 2021-06-06 DIAGNOSIS — I951 Orthostatic hypotension: Secondary | ICD-10-CM | POA: Diagnosis not present

## 2021-06-06 DIAGNOSIS — M009 Pyogenic arthritis, unspecified: Secondary | ICD-10-CM | POA: Diagnosis not present

## 2021-06-06 DIAGNOSIS — I110 Hypertensive heart disease with heart failure: Secondary | ICD-10-CM | POA: Diagnosis not present

## 2021-06-08 DIAGNOSIS — T8453XA Infection and inflammatory reaction due to internal right knee prosthesis, initial encounter: Secondary | ICD-10-CM | POA: Diagnosis not present

## 2021-06-08 DIAGNOSIS — I110 Hypertensive heart disease with heart failure: Secondary | ICD-10-CM | POA: Diagnosis not present

## 2021-06-08 DIAGNOSIS — I951 Orthostatic hypotension: Secondary | ICD-10-CM | POA: Diagnosis not present

## 2021-06-08 DIAGNOSIS — M009 Pyogenic arthritis, unspecified: Secondary | ICD-10-CM | POA: Diagnosis not present

## 2021-06-08 DIAGNOSIS — I5022 Chronic systolic (congestive) heart failure: Secondary | ICD-10-CM | POA: Diagnosis not present

## 2021-06-09 DIAGNOSIS — M00861 Arthritis due to other bacteria, right knee: Secondary | ICD-10-CM | POA: Diagnosis not present

## 2021-06-10 DIAGNOSIS — M009 Pyogenic arthritis, unspecified: Secondary | ICD-10-CM | POA: Diagnosis not present

## 2021-06-10 DIAGNOSIS — Z471 Aftercare following joint replacement surgery: Secondary | ICD-10-CM | POA: Diagnosis not present

## 2021-06-10 DIAGNOSIS — I951 Orthostatic hypotension: Secondary | ICD-10-CM | POA: Diagnosis not present

## 2021-06-10 DIAGNOSIS — I110 Hypertensive heart disease with heart failure: Secondary | ICD-10-CM | POA: Diagnosis not present

## 2021-06-10 DIAGNOSIS — I5022 Chronic systolic (congestive) heart failure: Secondary | ICD-10-CM | POA: Diagnosis not present

## 2021-06-10 DIAGNOSIS — Z96651 Presence of right artificial knee joint: Secondary | ICD-10-CM | POA: Diagnosis not present

## 2021-06-10 DIAGNOSIS — T8453XA Infection and inflammatory reaction due to internal right knee prosthesis, initial encounter: Secondary | ICD-10-CM | POA: Diagnosis not present

## 2021-06-13 DIAGNOSIS — I5022 Chronic systolic (congestive) heart failure: Secondary | ICD-10-CM | POA: Diagnosis not present

## 2021-06-13 DIAGNOSIS — I110 Hypertensive heart disease with heart failure: Secondary | ICD-10-CM | POA: Diagnosis not present

## 2021-06-13 DIAGNOSIS — I951 Orthostatic hypotension: Secondary | ICD-10-CM | POA: Diagnosis not present

## 2021-06-13 DIAGNOSIS — M0080 Arthritis due to other bacteria, unspecified joint: Secondary | ICD-10-CM | POA: Diagnosis not present

## 2021-06-13 DIAGNOSIS — T8453XA Infection and inflammatory reaction due to internal right knee prosthesis, initial encounter: Secondary | ICD-10-CM | POA: Diagnosis not present

## 2021-06-13 DIAGNOSIS — M009 Pyogenic arthritis, unspecified: Secondary | ICD-10-CM | POA: Diagnosis not present

## 2021-06-15 ENCOUNTER — Ambulatory Visit (INDEPENDENT_AMBULATORY_CARE_PROVIDER_SITE_OTHER): Payer: Medicare Other | Admitting: Internal Medicine

## 2021-06-15 ENCOUNTER — Encounter: Payer: Self-pay | Admitting: Internal Medicine

## 2021-06-15 ENCOUNTER — Other Ambulatory Visit: Payer: Self-pay

## 2021-06-15 VITALS — BP 99/64 | HR 76

## 2021-06-15 DIAGNOSIS — T8453XA Infection and inflammatory reaction due to internal right knee prosthesis, initial encounter: Secondary | ICD-10-CM | POA: Diagnosis not present

## 2021-06-15 DIAGNOSIS — M009 Pyogenic arthritis, unspecified: Secondary | ICD-10-CM | POA: Diagnosis not present

## 2021-06-15 DIAGNOSIS — I110 Hypertensive heart disease with heart failure: Secondary | ICD-10-CM | POA: Diagnosis not present

## 2021-06-15 DIAGNOSIS — T8459XD Infection and inflammatory reaction due to other internal joint prosthesis, subsequent encounter: Secondary | ICD-10-CM | POA: Diagnosis not present

## 2021-06-15 DIAGNOSIS — A491 Streptococcal infection, unspecified site: Secondary | ICD-10-CM | POA: Diagnosis not present

## 2021-06-15 DIAGNOSIS — Z96659 Presence of unspecified artificial knee joint: Secondary | ICD-10-CM

## 2021-06-15 DIAGNOSIS — I5022 Chronic systolic (congestive) heart failure: Secondary | ICD-10-CM | POA: Diagnosis not present

## 2021-06-15 DIAGNOSIS — I951 Orthostatic hypotension: Secondary | ICD-10-CM | POA: Diagnosis not present

## 2021-06-15 MED ORDER — CEFADROXIL 500 MG PO CAPS: 1000.0000 mg | ORAL_CAPSULE | Freq: Two times a day (BID) | ORAL | 0 refills | Status: AC

## 2021-06-15 NOTE — Patient Instructions (Addendum)
Thank you for coming to see me today. It was a pleasure seeing you.  To Do: Continue ceftriaxone via picc line through 06/23/21 Then start taking Cefadroxil '1000mg'$  (2 capsules) twice daily for 2 weeks.  I sent this prescription to your pharmacy today. Follow up in 4 weeks with me  If you have any questions or concerns, please do not hesitate to call the office at [phone].  Take Care,   Jule Ser

## 2021-06-15 NOTE — Progress Notes (Signed)
    [facility]  Reason for Consult: PJI  Referring Provider: Dr Swinteck   HPI:    Patrick Fritz is a 70 y.o. male with PMHx as below who presents to the clinic for PJI.   Patient presents today for hospital follow-up.  He was admitted to [place] [facility] hospital 8/9 through 05/29/2021 for right knee prosthetic joint infection.  This was secondary to Streptococcus anginosus and he underwent resection arthroplasty with antibiotic spacer 05/26/2021.  His baseline ESR was 139, CRP 32.2.  He had a PICC line placed and he was discharged home on ceftriaxone 2 g daily x4 weeks through 06/23/2021.  His most recent OP AT labs from 06/06/2021 showed WBC 8.2, creatinine 0.7, ESR 70, and CRP 21.  He has been tolerating ceftriaxone without any issues and his PICC line has been functioning well.  He saw orthopedic surgery for follow-up on 06/10/2021.  He worked with physical therapy today.    Patient's Medications  New Prescriptions   CEFADROXIL (DURICEF) 500 MG CAPSULE    Take 2 capsules (1,000 mg total) by mouth 2 (two) times daily for 14 days.  Previous Medications   ACETAMINOPHEN (TYLENOL) 325 MG TABLET    Take 650 mg by mouth every 6 (six) hours as needed for moderate pain.   ALLOPURINOL (ZYLOPRIM) 300 MG TABLET    Take 300 mg by mouth daily.    CARVEDILOL (COREG) 3.125 MG TABLET    Take 1 tablet (3.125 mg total) by mouth 2 (two) times daily.   CEFTRIAXONE (ROCEPHIN) IVPB    Inject 2 g into the vein daily for 28 days. Indication:  Septic joint First Dose: No Last Day of Therapy:  06/24/2021 Labs - Once weekly:  CBC/D and BMP, Labs - Every other week:  ESR and CRP Method of administration: IV Push Method of administration may be changed at the discretion of home infusion pharmacist based upon assessment of the patient and/or caregiver's ability to self-administer the medication ordered.   DAPAGLIFLOZIN PROPANEDIOL (FARXIGA) 10 MG TABS TABLET    Take 1 tablet (10 mg total)  by mouth daily before breakfast.   DICLOFENAC SODIUM (VOLTAREN) 1 % GEL    Apply 1 application topically daily as needed for pain.   DIPHENHYDRAMINE-ACETAMINOPHEN (TYLENOL PM) 25-500 MG TABS TABLET    Take 2 tablets by mouth at bedtime as needed (pain).   GABAPENTIN (NEURONTIN) 100 MG CAPSULE    Take 100 mg by mouth 2 (two) times daily.   HYDROCODONE-ACETAMINOPHEN (NORCO) 7.5-325 MG TABLET    Take 1-2 tablets by mouth every 4 (four) hours as needed for severe pain (pain score 7-10).   LOSARTAN (COZAAR) 50 MG TABLET    Take 25 mg by mouth daily.   RIVAROXABAN (XARELTO) 20 MG TABS TABLET    Take 1 tablet (20 mg total) by mouth daily with supper.   SERTRALINE (ZOLOFT) 100 MG TABLET    Take 150 mg by mouth daily.   SIMVASTATIN (ZOCOR) 40 MG TABLET    Take 1 tablet (40 mg total) by mouth at bedtime.   SPIRONOLACTONE (ALDACTONE) 25 MG TABLET    Take 0.5 tablets (12.5 mg total) by mouth daily.  Modified Medications   No medications on file  Discontinued Medications   No medications on file      Past Medical History:  Diagnosis Date   DJD (degenerative joint disease), lumbar    DVT (deep venous thrombosis) (HCC)    Family history   of adverse reaction to anesthesia    Glaucoma    R eye diminished vision approx 50%   High cholesterol    Hyperlipidemia    Hypertension    IFG (impaired fasting glucose)    OSA (obstructive sleep apnea)    Severe w AHI 34/hr on HST no on BiPAP at 19/15cm H2O   PE (pulmonary embolism)    Second occurance,enknown etiology,lifelong anticoagulation    Social History   Tobacco Use   Smoking status: Never   Smokeless tobacco: Never  Vaping Use   Vaping Use: Never used  Substance Use Topics   Alcohol use: Yes    Comment: 1-2 per week   Drug use: Never    Family History  Problem Relation Age of Onset   Osteoporosis Mother    Hypertension Father    CAD Father     No Active Allergies  Review of Systems  Constitutional: Negative.   Gastrointestinal:  Negative.   Musculoskeletal:  Positive for joint pain.  Skin: Negative.      OBJECTIVE:    Vitals:   06/15/21 1432  BP: 99/64  Pulse: 76     There is no height or weight on file to calculate BMI.  Physical Exam Constitutional:      General: He is not in acute distress.    Appearance: Normal appearance.  HENT:     Head: Normocephalic and atraumatic.  Pulmonary:     Effort: Pulmonary effort is normal. No respiratory distress.  Musculoskeletal:     Comments: Right UE PICC Right knee in brace.  Skin:    General: Skin is warm and dry.  Neurological:     General: No focal deficit present.     Mental Status: He is alert and oriented to person, place, and time.     Labs and Microbiology:  CBC Latest Ref Rng & Units 05/28/2021 05/27/2021 05/26/2021  WBC 4.0 - 10.5 K/uL 13.2(H) 13.1(H) 8.0  Hemoglobin 13.0 - 17.0 g/dL 10.8(L) 10.4(L) 10.3(L)  Hematocrit 39.0 - 52.0 % 34.5(L) 32.2(L) 32.3(L)  Platelets 150 - 400 K/uL 438(H) 427(H) 467(H)   CMP Latest Ref Rng & Units 05/27/2021 05/26/2021 05/25/2021  Glucose 70 - 99 mg/dL 146(H) 123(H) 104(H)  BUN 8 - 23 mg/dL 18 17 18  Creatinine 0.61 - 1.24 mg/dL 0.86 0.84 0.85  Sodium 135 - 145 mmol/L 135 136 139  Potassium 3.5 - 5.1 mmol/L 4.7 4.2 4.7  Chloride 98 - 111 mmol/L 102 102 102  CO2 22 - 32 mmol/L 24 25 27  Calcium 8.9 - 10.3 mg/dL 8.6(L) 8.5(L) 8.8(L)  Total Protein 6.5 - 8.1 g/dL - 6.7 6.9  Total Bilirubin 0.3 - 1.2 mg/dL - 0.4 0.5  Alkaline Phos 38 - 126 U/L - 350(H) 333(H)  AST 15 - 41 U/L - 52(H) 31  ALT 0 - 44 U/L - 41 33       ASSESSMENT & PLAN:    Infection of total knee replacement (HCC) He is doing well on ceftriaxone 2gm daily via PICC line for 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  Inflammatory markers are trending down and he has had no issues with his PICC line.  Will continue with ceftriaxone through 06/23/21 then transition to Cefadroxil 1gm BID x 14 days  to complete 6 weeks total of antibiotic therapy. He will continue to follow up with Dr Swinteck regarding next steps for second stage of procedure.  RTC 4 weeks.       [facility] 06/15/2021, 2:59 PM   I spent 30 minutes dedicated to the care of this patient on the date of this encounter to include pre-visit review of records, face-to-face time with the patient discussing PJI, strep infection, and post-visit ordering of testing.   

## 2021-06-15 NOTE — Assessment & Plan Note (Signed)
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  Inflammatory markers are trending down and he has had no issues with his PICC line.  Will continue with ceftriaxone through 06/23/21 then transition to Cefadroxil 1gm BID x 14 days to complete 6 weeks total of antibiotic therapy. He will continue to follow up with Dr Lyla Glassing regarding next steps for second stage of procedure.  RTC 4 weeks.

## 2021-06-17 DIAGNOSIS — M00861 Arthritis due to other bacteria, right knee: Secondary | ICD-10-CM | POA: Diagnosis not present

## 2021-06-20 DIAGNOSIS — I951 Orthostatic hypotension: Secondary | ICD-10-CM | POA: Diagnosis not present

## 2021-06-20 DIAGNOSIS — M009 Pyogenic arthritis, unspecified: Secondary | ICD-10-CM | POA: Diagnosis not present

## 2021-06-20 DIAGNOSIS — T8453XA Infection and inflammatory reaction due to internal right knee prosthesis, initial encounter: Secondary | ICD-10-CM | POA: Diagnosis not present

## 2021-06-20 DIAGNOSIS — I110 Hypertensive heart disease with heart failure: Secondary | ICD-10-CM | POA: Diagnosis not present

## 2021-06-20 DIAGNOSIS — I5022 Chronic systolic (congestive) heart failure: Secondary | ICD-10-CM | POA: Diagnosis not present

## 2021-06-21 DIAGNOSIS — T8453XA Infection and inflammatory reaction due to internal right knee prosthesis, initial encounter: Secondary | ICD-10-CM | POA: Diagnosis not present

## 2021-06-21 DIAGNOSIS — M009 Pyogenic arthritis, unspecified: Secondary | ICD-10-CM | POA: Diagnosis not present

## 2021-06-21 DIAGNOSIS — I110 Hypertensive heart disease with heart failure: Secondary | ICD-10-CM | POA: Diagnosis not present

## 2021-06-21 DIAGNOSIS — I5022 Chronic systolic (congestive) heart failure: Secondary | ICD-10-CM | POA: Diagnosis not present

## 2021-06-21 DIAGNOSIS — I951 Orthostatic hypotension: Secondary | ICD-10-CM | POA: Diagnosis not present

## 2021-06-21 DIAGNOSIS — M0080 Arthritis due to other bacteria, unspecified joint: Secondary | ICD-10-CM | POA: Diagnosis not present

## 2021-06-22 ENCOUNTER — Telehealth: Payer: Self-pay | Admitting: Pharmacist

## 2021-06-22 DIAGNOSIS — I951 Orthostatic hypotension: Secondary | ICD-10-CM | POA: Diagnosis not present

## 2021-06-22 DIAGNOSIS — T8453XA Infection and inflammatory reaction due to internal right knee prosthesis, initial encounter: Secondary | ICD-10-CM | POA: Diagnosis not present

## 2021-06-22 DIAGNOSIS — I110 Hypertensive heart disease with heart failure: Secondary | ICD-10-CM | POA: Diagnosis not present

## 2021-06-22 DIAGNOSIS — M009 Pyogenic arthritis, unspecified: Secondary | ICD-10-CM | POA: Diagnosis not present

## 2021-06-22 DIAGNOSIS — I5022 Chronic systolic (congestive) heart failure: Secondary | ICD-10-CM | POA: Diagnosis not present

## 2021-06-22 NOTE — Telephone Encounter (Signed)
Patrick Fritz from [facility] called today stating Patrick Fritz's ESR had increased from 67 last week to 99 yesterday. CRP is still stable at 13, WBC 5.4, and Scr at 0.87. Last [facility] appointment with Juleen China shows that patient will transition from IV antibiotics to cefadroxil for an additional 2 weeks. Home Health RN states knee as well as PICC site look "great" and have healed well. [name] calling to see if you would like to extend pull PICC orders past Friday on 9/9. Please advise.  Thanks!  Alfonse Spruce, PharmD, CPP Clinical Pharmacist Practitioner [facility]

## 2021-06-23 DIAGNOSIS — M00861 Arthritis due to other bacteria, right knee: Secondary | ICD-10-CM | POA: Diagnosis not present

## 2021-06-23 NOTE — Telephone Encounter (Signed)
Yes - I will call them back this morning to relay orders. Thanks!

## 2021-06-23 NOTE — Telephone Encounter (Signed)
Patrick Fritz will pull PICC on Friday and draw ESR, CRP, and Cmet prior to pulling PICC. Thanks!

## 2021-06-24 ENCOUNTER — Telehealth: Payer: Self-pay | Admitting: Adult Health

## 2021-06-24 DIAGNOSIS — I5022 Chronic systolic (congestive) heart failure: Secondary | ICD-10-CM | POA: Diagnosis not present

## 2021-06-24 DIAGNOSIS — T8453XA Infection and inflammatory reaction due to internal right knee prosthesis, initial encounter: Secondary | ICD-10-CM | POA: Diagnosis not present

## 2021-06-24 DIAGNOSIS — M009 Pyogenic arthritis, unspecified: Secondary | ICD-10-CM | POA: Diagnosis not present

## 2021-06-24 DIAGNOSIS — M0080 Arthritis due to other bacteria, unspecified joint: Secondary | ICD-10-CM | POA: Diagnosis not present

## 2021-06-24 DIAGNOSIS — I951 Orthostatic hypotension: Secondary | ICD-10-CM | POA: Diagnosis not present

## 2021-06-24 DIAGNOSIS — I110 Hypertensive heart disease with heart failure: Secondary | ICD-10-CM | POA: Diagnosis not present

## 2021-06-24 NOTE — Telephone Encounter (Signed)
FYI-Pt's wife called stating that the nurse that comes to the home to assist pt once a week informed her that she should report an episode that pt had about a week or two ago. Wife Patrick Fritz states that pt was sitting on the chair and she noticed that his hands started jerking and then his head went back and did not respond to her trying to wake him. Wife called the EMS and they checked his pressure and other Vital Signs and everything checked out fine and pt recognized her as well.

## 2021-06-27 DIAGNOSIS — I5022 Chronic systolic (congestive) heart failure: Secondary | ICD-10-CM | POA: Diagnosis not present

## 2021-06-27 DIAGNOSIS — M009 Pyogenic arthritis, unspecified: Secondary | ICD-10-CM | POA: Diagnosis not present

## 2021-06-27 DIAGNOSIS — I951 Orthostatic hypotension: Secondary | ICD-10-CM | POA: Diagnosis not present

## 2021-06-27 DIAGNOSIS — I110 Hypertensive heart disease with heart failure: Secondary | ICD-10-CM | POA: Diagnosis not present

## 2021-06-27 DIAGNOSIS — T8453XA Infection and inflammatory reaction due to internal right knee prosthesis, initial encounter: Secondary | ICD-10-CM | POA: Diagnosis not present

## 2021-06-27 NOTE — Telephone Encounter (Signed)
Attempted to reach wife for more information, no answer and voice MB not set up.

## 2021-06-27 NOTE — Progress Notes (Signed)
Cardiology Office Note:    Date:  06/28/2021   ID:  [name], DOB Feb 08, 1951, MRN [id number]  PCP:  Mayra Neer, MD  Cardiologist:  Sinclair Grooms, MD   Referring MD: Mayra Neer, MD   Chief Complaint  Patient presents with   Congestive Heart Failure     History of Present Illness:    Patrick Fritz is a 70 y.o. 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  Feels better on Farxiga, Losatan, Carvedilol, and Spironolactone.  In the interval since last office visit, developed right knee infection and required right total knee resection.  The prosthesis was greater than 42 years old.  We will need to have a repeat knee replacement surgery once infection is resolved.  Despite the operation and infection noted above, no cardiac complications.  He denies orthopnea, PND, and syncope.  Lower extremity swelling has resolved.  He has not had lightheadedness or dizziness.  He feels well from a cardiac standpoint and is tolerating medications without difficulty.  Past Medical History:  Diagnosis Date   DJD (degenerative joint disease), lumbar    DVT (deep venous thrombosis) (HCC)    Family history of adverse reaction to anesthesia    Glaucoma    R eye diminished vision approx 50%   High cholesterol    Hyperlipidemia    Hypertension    IFG (impaired fasting glucose)    OSA (obstructive sleep apnea)    Severe w AHI 34/hr on HST no on BiPAP at 19/15cm H2O   PE (pulmonary embolism)    Second occurance,enknown etiology,lifelong anticoagulation    Past Surgical History:  Procedure Laterality Date   CARDIAC CATHETERIZATION N/A 05/14/2016   Procedure: Left Heart Cath and Coronary Angiography;   Surgeon: Belva Crome, MD;  Location: [facility];  Service: Cardiovascular;  Laterality: N/A;   CORONARY ARTERY BYPASS GRAFT N/A 05/14/2016   Procedure: CORONARY ARTERY BYPASS GRAFTING (CABG) times two with LIMA to LAD and right leg SVG to PDA,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;  Surgeon: Ivin Poot, MD;  Location: [facility];  Service: Open Heart Surgery;  Laterality: N/A;   EXCISIONAL TOTAL KNEE ARTHROPLASTY WITH ANTIBIOTIC SPACERS Right 05/26/2021   Procedure: EXCISIONAL TOTAL KNEE ARTHROPLASTY WITH ANTIBIOTIC SPACERS;  Surgeon: Rod Can, MD;  Location: [facility];  Service: Orthopedics;  Laterality: Right;   KNEE SURGERY Bilateral    arthroscopic   TOTAL KNEE ARTHROPLASTY Bilateral 1996    Current Medications: Current Meds  Medication Sig   acetaminophen (TYLENOL) 325 MG tablet Take 650 mg by mouth every 6 (six) hours as needed for moderate pain.   allopurinol (ZYLOPRIM) 300 MG tablet Take 300 mg by mouth daily.    carvedilol (COREG) 3.125 MG tablet Take 1 tablet (3.125 mg total) by mouth 2 (two) times daily.   cefadroxil (DURICEF) 500 MG capsule Take 2 capsules (1,000 mg total) by mouth 2 (two) times daily for 14 days.   dapagliflozin propanediol (FARXIGA) 10 MG TABS tablet Take 1 tablet (10 mg total) by mouth daily before breakfast.   diclofenac Sodium (VOLTAREN) 1 % GEL Apply 1 application topically daily as needed for pain.   diphenhydramine-acetaminophen (TYLENOL PM) 25-500 MG TABS tablet Take  2 tablets by mouth at bedtime as needed (pain).   gabapentin (NEURONTIN) 100 MG capsule Take 100 mg by mouth 2 (two) times daily.   HYDROcodone-acetaminophen (NORCO) 7.5-325 MG tablet Take 1-2 tablets by mouth every 4 (four) hours as needed for severe pain (pain score 7-10).   losartan (COZAAR) 50 MG tablet Take 25 mg by mouth daily.   rivaroxaban (XARELTO) 20 MG TABS tablet Take 1 tablet (20 mg total) by mouth daily with supper.   sertraline (ZOLOFT) 100 MG tablet Take  150 mg by mouth daily.   simvastatin (ZOCOR) 40 MG tablet Take 1 tablet (40 mg total) by mouth at bedtime.   spironolactone (ALDACTONE) 25 MG tablet Take 0.5 tablets (12.5 mg total) by mouth daily.     Allergies:   Patient has no active allergies.   Social History   Socioeconomic History   Marital status: Married    Spouse name: Not on file   Number of children: Not on file   Years of education: Not on file   Highest education level: Not on file  Occupational History   Not on file  Tobacco Use   Smoking status: Never   Smokeless tobacco: Never  Vaping Use   Vaping Use: Never used  Substance and Sexual Activity   Alcohol use: Yes    Comment: 1-2 per week   Drug use: Never   Sexual activity: Not on file  Other Topics Concern   Not on file  Social History Narrative   Not on file   Social Determinants of Health   Financial Resource Strain: Not on file  Food Insecurity: Not on file  Transportation Needs: Not on file  Physical Activity: Not on file  Stress: Not on file  Social Connections: Not on file     Family History: The patient's family history includes CAD in his father; Hypertension in his father; Osteoporosis in his mother.  ROS:   Please see the history of present illness.    His wife called EMS within the past month because the patient was sitting on the couch and suddenly either fell asleep and had a twitch in his right arm or something else.  When he did not answer her over a 5 to 10-second time frame of trying to arouse him verbally, she called 911.  Before EMS could arrive and within 30 seconds of either falling asleep or having the episode, he was awake and answering questions.  EKG, blood sugar, blood pressure was wall okay by EMS and he was taken to the emergency room.  She now admits that she may have over reacted and that he could have just nodded off to sleep.  They were watching a baseball game on TV.  All other systems reviewed and are  negative.  EKGs/Labs/Other Studies Reviewed:    The following studies were reviewed today:  2D Doppler echocardiogram/06/2021 IMPRESSIONS     1. Septal and inferior wall hypokinesis . Left ventricular ejection  fraction, by estimation, is 30 to 35%. The left ventricle has moderately  decreased function. The left ventricle demonstrates regional wall motion  abnormalities (see scoring  diagram/findings for description). The left ventricular internal cavity  size was mildly dilated. Left ventricular diastolic parameters were  normal.   2. Right ventricular systolic function is normal. The right ventricular  size is normal. There is normal pulmonary artery systolic pressure.   3. Left atrial size was moderately dilated.   4. The mitral valve is abnormal. Mild mitral valve  regurgitation. No  evidence of mitral stenosis.   5. The aortic valve is tricuspid. There is moderate calcification of the  aortic valve. Aortic valve regurgitation is mild. Mild aortic valve  sclerosis is present, with no evidence of aortic valve stenosis.   6. The inferior vena cava is normal in size with greater than 50%  respiratory variability, suggesting right atrial pressure of 3 mmHg.   EKG:  EKG not repeated.  In April it revealed sinus rhythm with PACs and PVCs.  Evidence of old inferior infarct.  Recent Labs: 01/22/2021: TSH 2.532 05/25/2021: Magnesium 2.1 05/26/2021: ALT 41 05/27/2021: BUN 18; Creatinine, Ser 0.86; Potassium 4.7; Sodium 135 05/28/2021: Hemoglobin 10.8; Platelets 438  Recent Lipid Panel    Component Value Date/Time   CHOL 184 01/22/2021 0430   CHOL 165 10/21/2020 0932   TRIG 472 (H) 01/22/2021 0430   HDL 34 (L) 01/22/2021 0430   HDL 49 10/21/2020 0932   CHOLHDL 5.4 01/22/2021 0430   VLDL UNABLE TO CALCULATE IF TRIGLYCERIDE OVER 400 mg/dL 01/22/2021 0430   LDLCALC UNABLE TO CALCULATE IF TRIGLYCERIDE OVER 400 mg/dL 01/22/2021 0430   LDLCALC 97 10/21/2020 0932   LDLDIRECT 55.6 01/22/2021  0430    Physical Exam:    VS:  BP 98/68   Pulse 88   Ht '5\' 10"'$  (1.778 m)   Wt 213 lb 6.4 oz (96.8 kg)   SpO2 99%   BMI 30.62 kg/m     Wt Readings from Last 3 Encounters:  06/28/21 213 lb 6.4 oz (96.8 kg)  05/26/21 210 lb 1.6 oz (95.3 kg)  05/13/21 222 lb (100.7 kg)     GEN: He is down 10 pounds since July.. No acute distress HEENT: Normal NECK: No JVD. LYMPHATICS: No lymphadenopathy CARDIAC: No murmur. RRR S4 but no S3 gallop, or edema. VASCULAR:  Normal Pulses. No bruits. RESPIRATORY:  Clear to auscultation without rales, wheezing or rhonchi  ABDOMEN: Soft, non-tender, non-distended, No pulsatile mass, MUSCULOSKELETAL: No deformity  SKIN: Warm and dry NEUROLOGIC:  Alert and oriented x 3 PSYCHIATRIC:  Normal affect   ASSESSMENT:    1. Chronic systolic heart failure ([place])   2. S/P CABG x 2   3. Bilateral pulmonary embolism ([place])   4. Hyperlipidemia LDL goal <70   5. Chronic anticoagulation   6. Obstructive sleep apnea   7. Hx of ascending aorta replacement    PLAN:    In order of problems listed above:  Continue current quadruple therapy for systolic heart failure which includes carvedilol 3.125 mg twice daily, losartan 12.5 mg daily, spironolactone 12.5 mg daily, and Farxiga 10 mg/day.  2D Doppler echocardiogram in 6 months.  Have been unable to use Entresto because of lowish blood pressure.  On therapy patient's edema has significantly improved.  Feels well and has no cardiac complaints.  I will see him after the echocardiogram is performed. Denies angina Continue Xarelto therapy. Continue Zocor 40 mg/day. Xarelto as noted above. CPAP use encouraged.  Guideline directed therapy for left ventricular systolic dysfunction: Angiotensin receptor-neprilysin inhibitor (ARNI)-Entresto; beta-blocker therapy - carvedilol, metoprolol succinate, or bisoprolol; mineralocorticoid receptor antagonist (MRA) therapy -spironolactone or eplerenone.  SGLT-2 agents -  Dapagliflozin  [place]) or Empagliflozin ([place]).These therapies have been shown to improve clinical outcomes including reduction of rehospitalization, survival, and acute heart failure.     Medication Adjustments/Labs and Tests Ordered: Current medicines are reviewed at length with the patient today.  Concerns regarding medicines are outlined above.  Orders Placed This Encounter  Procedures  Basic metabolic panel   ECHOCARDIOGRAM COMPLETE   No orders of the defined types were placed in this encounter.   Patient Instructions  Medication Instructions:  Your physician recommends that you continue on your current medications as directed. Please refer to the Current Medication list given to you today.  *If you need a refill on your cardiac medications before your next appointment, please call your pharmacy*   Lab Work: BMET in December (can be same day as echo).  If you have labs (blood work) drawn today and your tests are completely normal, you will receive your results only by: [place] (if you have MyChart) OR A paper copy in the mail If you have any lab test that is abnormal or we need to change your treatment, we will call you to review the results.   Testing/Procedures: Your physician has requested that you have an echocardiogram 3-4 months. Echocardiography is a painless test that uses sound waves to create images of your heart. It provides your doctor with information about the size and shape of your heart and how well your heart's chambers and valves are working. This procedure takes approximately one hour. There are no restrictions for this procedure.    Follow-Up: At [facility], you and your health needs are our priority.  As part of our continuing mission to provide you with exceptional heart care, we have created designated Provider Care Teams.  These Care Teams include your primary Cardiologist (physician) and Advanced Practice Providers (APPs -  Physician Assistants  and Nurse Practitioners) who all work together to provide you with the care you need, when you need it.  We recommend signing up for the patient portal called "MyChart".  Sign up information is provided on this After Visit Summary.  MyChart is used to connect with patients for Virtual Visits (Telemedicine).  Patients are able to view lab/test results, encounter notes, upcoming appointments, etc.  Non-urgent messages can be sent to your provider as well.   To learn more about what you can do with MyChart, go to [email or web address].    Your next appointment:   Should be shortly after your echo- can have 1/10 at 1:40pm or 1/13 at 1:20pm  The format for your next appointment:   In Person  Provider:   You may see Sinclair Grooms, MD or one of the following Advanced Practice Providers on your designated Care Team:   Cecilie Kicks, NP   Other Instructions     Signed, Sinclair Grooms, MD  06/28/2021 2:19 PM    [place]

## 2021-06-28 ENCOUNTER — Encounter: Payer: Self-pay | Admitting: Interventional Cardiology

## 2021-06-28 ENCOUNTER — Other Ambulatory Visit: Payer: Self-pay

## 2021-06-28 ENCOUNTER — Ambulatory Visit: Payer: Medicare Other | Admitting: Interventional Cardiology

## 2021-06-28 VITALS — BP 98/68 | HR 88 | Ht 70.0 in | Wt 213.4 lb

## 2021-06-28 DIAGNOSIS — I2699 Other pulmonary embolism without acute cor pulmonale: Secondary | ICD-10-CM

## 2021-06-28 DIAGNOSIS — E785 Hyperlipidemia, unspecified: Secondary | ICD-10-CM | POA: Diagnosis not present

## 2021-06-28 DIAGNOSIS — Z7901 Long term (current) use of anticoagulants: Secondary | ICD-10-CM

## 2021-06-28 DIAGNOSIS — Z95828 Presence of other vascular implants and grafts: Secondary | ICD-10-CM

## 2021-06-28 DIAGNOSIS — Z951 Presence of aortocoronary bypass graft: Secondary | ICD-10-CM | POA: Diagnosis not present

## 2021-06-28 DIAGNOSIS — G4733 Obstructive sleep apnea (adult) (pediatric): Secondary | ICD-10-CM

## 2021-06-28 DIAGNOSIS — T8453XA Infection and inflammatory reaction due to internal right knee prosthesis, initial encounter: Secondary | ICD-10-CM | POA: Diagnosis not present

## 2021-06-28 DIAGNOSIS — I110 Hypertensive heart disease with heart failure: Secondary | ICD-10-CM | POA: Diagnosis not present

## 2021-06-28 DIAGNOSIS — I5022 Chronic systolic (congestive) heart failure: Secondary | ICD-10-CM

## 2021-06-28 DIAGNOSIS — M009 Pyogenic arthritis, unspecified: Secondary | ICD-10-CM | POA: Diagnosis not present

## 2021-06-28 DIAGNOSIS — I951 Orthostatic hypotension: Secondary | ICD-10-CM | POA: Diagnosis not present

## 2021-06-28 NOTE — Telephone Encounter (Signed)
I called pt's wife back.  She sts two weeks ago the pt was at home sitting in his chair and his hands started jerking and his head feel back. Pt was unable to speak to her her for 1-2 mins but did not loose consciousness. Sts this was the first time an event like this had taken place. EMS was called and evaluated the pt. Wife reports once EMS arrived vitals were stable and EKG strip was normal. Pt was not transported to the ED.  Wife reports the home health nurse encouraged her to call and report this episode. She sts since the event, the pt has returned to baseline and has been doing well.   Pt has spoke with PCP since the event and is scheduled to see cardiologist today. I advised pt's wife that I would relay message to Janett Billow and we would plan to see him back as scheduled for 08/24/21. She will continue to monitor the pt and let us know if any other issues come up.

## 2021-06-28 NOTE — Telephone Encounter (Signed)
Sounds like possible seizure.  Please schedule him for a follow-up visit. Thank you

## 2021-06-28 NOTE — Patient Instructions (Addendum)
Medication Instructions:  Your physician recommends that you continue on your current medications as directed. Please refer to the Current Medication list given to you today.  *If you need a refill on your cardiac medications before your next appointment, please call your pharmacy*   Lab Work: BMET in December (can be same day as echo).  If you have labs (blood work) drawn today and your tests are completely normal, you will receive your results only by: [place] (if you have MyChart) OR A paper copy in the mail If you have any lab test that is abnormal or we need to change your treatment, we will call you to review the results.   Testing/Procedures: Your physician has requested that you have an echocardiogram 3-4 months. Echocardiography is a painless test that uses sound waves to create images of your heart. It provides your doctor with information about the size and shape of your heart and how well your heart's chambers and valves are working. This procedure takes approximately one hour. There are no restrictions for this procedure.    Follow-Up: At [facility], you and your health needs are our priority.  As part of our continuing mission to provide you with exceptional heart care, we have created designated Provider Care Teams.  These Care Teams include your primary Cardiologist (physician) and Advanced Practice Providers (APPs -  Physician Assistants and Nurse Practitioners) who all work together to provide you with the care you need, when you need it.  We recommend signing up for the patient portal called "MyChart".  Sign up information is provided on this After Visit Summary.  MyChart is used to connect with patients for Virtual Visits (Telemedicine).  Patients are able to view lab/test results, encounter notes, upcoming appointments, etc.  Non-urgent messages can be sent to your provider as well.   To learn more about what you can do with MyChart, go to  [email or web address].    Your next appointment:   Should be shortly after your echo- can have 1/10 at 1:40pm or 1/13 at 1:20pm  The format for your next appointment:   In Person  Provider:   You may see Sinclair Grooms, MD or one of the following Advanced Practice Providers on your designated Care Team:   Cecilie Kicks, NP   Other Instructions

## 2021-06-29 DIAGNOSIS — Z96659 Presence of unspecified artificial knee joint: Secondary | ICD-10-CM | POA: Diagnosis not present

## 2021-06-29 DIAGNOSIS — M009 Pyogenic arthritis, unspecified: Secondary | ICD-10-CM | POA: Diagnosis not present

## 2021-06-29 DIAGNOSIS — T8459XA Infection and inflammatory reaction due to other internal joint prosthesis, initial encounter: Secondary | ICD-10-CM | POA: Diagnosis not present

## 2021-06-29 NOTE — Telephone Encounter (Signed)
I attempted to reach the pt's wife to get pt worked in for f/u. She was not available and vm was not set up. Will try again later.

## 2021-06-30 NOTE — Telephone Encounter (Signed)
I attempted to reach the pt's wife to get pt worked in for f/u. She was not available and vm was not set up. Will try again later.

## 2021-07-05 NOTE — Telephone Encounter (Signed)
I attempted to reach the pt's wife to get pt worked in for f/u. She was not available and vm was not set up.  This was the third attempt to reach the pt. Will mail a letter asking for a call to set up the appt.

## 2021-07-11 DIAGNOSIS — Z96651 Presence of right artificial knee joint: Secondary | ICD-10-CM | POA: Diagnosis not present

## 2021-07-11 DIAGNOSIS — Z471 Aftercare following joint replacement surgery: Secondary | ICD-10-CM | POA: Diagnosis not present

## 2021-07-13 ENCOUNTER — Ambulatory Visit (INDEPENDENT_AMBULATORY_CARE_PROVIDER_SITE_OTHER): Payer: Medicare Other | Admitting: Internal Medicine

## 2021-07-13 ENCOUNTER — Other Ambulatory Visit: Payer: Self-pay

## 2021-07-13 ENCOUNTER — Encounter: Payer: Self-pay | Admitting: Internal Medicine

## 2021-07-13 VITALS — BP 130/76 | HR 69 | Temp 97.5°F | Ht 70.0 in | Wt 218.0 lb

## 2021-07-13 DIAGNOSIS — T8459XD Infection and inflammatory reaction due to other internal joint prosthesis, subsequent encounter: Secondary | ICD-10-CM

## 2021-07-13 DIAGNOSIS — A491 Streptococcal infection, unspecified site: Secondary | ICD-10-CM | POA: Diagnosis not present

## 2021-07-13 DIAGNOSIS — Z96659 Presence of unspecified artificial knee joint: Secondary | ICD-10-CM

## 2021-07-13 NOTE — Assessment & Plan Note (Signed)
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  He has been off antibiotics now for about 1 week and will return to his orthopedist in early October for repeat labs to see if he can proceed with reimplantation.  Hopefully, the prolonged antibiotic course was effective in eradicating his infection and he will follow up in 3 months for clinical re-evaluation.

## 2021-07-13 NOTE — Patient Instructions (Signed)
Thank you for coming to see me today. It was a pleasure seeing you.  To Do: Follow up with orthopedic surgery as planned Continue to monitor your symptoms off antibiotics Follow up with me in 3 months.   If you have any questions or concerns, please do not hesitate to call the office at [phone].  Take Care,   Jule Ser

## 2021-07-13 NOTE — Progress Notes (Signed)
[facility] Infectious Disease  CHIEF COMPLAINT:    Follow up for PJI  SUBJECTIVE:    Patrick Fritz is a 70 y.o. male with PMHx as below who presents to the clinic for PJI.   Patient presents today for routine follow up after previously being seen on 06/15/21.  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  He had resection arthroplasty with antibiotic spacer 05/26/21 and completed 4 weeks of ceftriaxone 2gm daily x 4 weeks through 06/23/21 at which time his PICC line was removed and he was transitioned to Cefadroxil 1gm BID for an additional 14 days to complete 6 weeks total of antibiotic therapy.    Today, reports overall doing well.  No fevers, chills, N/V/D.  His knee pain is stable.   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 Patient saw orthopedic surgery on 07/11/21 and will return on 07/22/21 with labs at that time to determine if he is able to proceed with reimplantation.   Please see A&P for the details of today's visit and status of the patient's medical problems.   Patient's Medications  New Prescriptions   No medications on file  Previous Medications   ACETAMINOPHEN (TYLENOL) 325 MG TABLET    Take 650 mg by mouth every 6 (six) hours as needed for moderate pain.   ALLOPURINOL (ZYLOPRIM) 300 MG TABLET    Take 300 mg by mouth daily.    CARVEDILOL (COREG) 3.125 MG TABLET    Take 1 tablet (3.125 mg total) by mouth 2 (two) times daily.   DAPAGLIFLOZIN PROPANEDIOL (FARXIGA) 10 MG TABS TABLET    Take 1 tablet (10 mg total) by mouth daily before breakfast.   DICLOFENAC SODIUM (VOLTAREN) 1 % GEL    Apply 1 application topically daily as needed for pain.   DIPHENHYDRAMINE-ACETAMINOPHEN (TYLENOL PM) 25-500 MG TABS TABLET    Take 2 tablets by mouth at bedtime as needed (pain).   GABAPENTIN (NEURONTIN) 100 MG CAPSULE    Take 100 mg by mouth 2 (two) times daily.    HYDROCODONE-ACETAMINOPHEN (NORCO) 7.5-325 MG TABLET    Take 1-2 tablets by mouth every 4 (four) hours as needed for severe pain (pain score 7-10).   LOSARTAN (COZAAR) 50 MG TABLET    Take 25 mg by mouth daily.   RIVAROXABAN (XARELTO) 20 MG TABS TABLET    Take 1 tablet (20 mg total) by mouth daily with supper.   SERTRALINE (ZOLOFT) 100 MG TABLET    Take 150 mg by mouth daily.   SIMVASTATIN (ZOCOR) 40 MG TABLET    Take 1 tablet (40 mg total) by mouth at bedtime.   SPIRONOLACTONE (ALDACTONE) 25 MG TABLET    Take 0.5 tablets (12.5 mg total) by mouth daily.  Modified Medications   No medications on file  Discontinued Medications   No medications on file      Past Medical History:  Diagnosis Date   DJD (degenerative joint disease), lumbar    DVT (deep venous thrombosis) (HCC)    Family history of adverse reaction to anesthesia    Glaucoma    R eye diminished vision approx 50%   High cholesterol    Hyperlipidemia    Hypertension    IFG (impaired fasting glucose)    OSA (obstructive sleep apnea)    Severe w AHI 34/hr on HST no on  BiPAP at 19/15cm H2O   PE (pulmonary embolism)    Second occurance,enknown etiology,lifelong anticoagulation    Social History   Tobacco Use   Smoking status: Never   Smokeless tobacco: Never  Vaping Use   Vaping Use: Never used  Substance Use Topics   Alcohol use: Yes    Comment: 1-2 per week   Drug use: Never    Family History  Problem Relation Age of Onset   Osteoporosis Mother    Hypertension Father    CAD Father     No Active Allergies  ROS As noted above.  OBJECTIVE:    There were no vitals filed for this visit. There is no height or weight on file to calculate BMI.  Physical Exam Constitutional:      General: He is not in acute distress.    Appearance: Normal appearance.  HENT:     Head: Normocephalic and atraumatic.  Pulmonary:     Effort: Pulmonary effort is normal. No respiratory distress.  Musculoskeletal:     Comments:  Right knee surgical incision well healed, no drainage.   Neurological:     General: No focal deficit present.     Mental Status: He is alert and oriented to person, place, and time.  Psychiatric:        Mood and Affect: Mood normal.        Behavior: Behavior normal.     Labs and Microbiology: CBC Latest Ref Rng & Units 05/28/2021 05/27/2021 05/26/2021  WBC 4.0 - 10.5 K/uL 13.2(H) 13.1(H) 8.0  Hemoglobin 13.0 - 17.0 g/dL 10.8(L) 10.4(L) 10.3(L)  Hematocrit 39.0 - 52.0 % 34.5(L) 32.2(L) 32.3(L)  Platelets 150 - 400 K/uL 438(H) 427(H) 467(H)   CMP Latest Ref Rng & Units 05/27/2021 05/26/2021 05/25/2021  Glucose 70 - 99 mg/dL 146(H) 123(H) 104(H)  BUN 8 - 23 mg/dL 18 17 18   Creatinine 0.61 - 1.24 mg/dL 0.86 0.84 0.85  Sodium 135 - 145 mmol/L 135 136 139  Potassium 3.5 - 5.1 mmol/L 4.7 4.2 4.7  Chloride 98 - 111 mmol/L 102 102 102  CO2 22 - 32 mmol/L 24 25 27   Calcium 8.9 - 10.3 mg/dL 8.6(L) 8.5(L) 8.8(L)  Total Protein 6.5 - 8.1 g/dL - 6.7 6.9  Total Bilirubin 0.3 - 1.2 mg/dL - 0.4 0.5  Alkaline Phos 38 - 126 U/L - 350(H) 333(H)  AST 15 - 41 U/L - 52(H) 31  ALT 0 - 44 U/L - 41 33      ASSESSMENT & PLAN:    Infection of total knee replacement [facility]) Patrick Fritz is doing well after completing 6 weeks of antibiotics for 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  He has been off antibiotics now for about 1 week and will return to his orthopedist in early October for repeat labs to see if he can proceed with reimplantation.  Hopefully, the prolonged antibiotic course was effective in eradicating his infection and he will follow up in 3 months for clinical re-evaluation.     [name] Infectious [facility] 07/13/2021, 11:45 AM  I spent 30 minutes dedicated to the care of this patient on the date of this encounter to include pre-visit review of records, face-to-face time with the patient discussing PJI, strep infection, and post-visit ordering of  testing.

## 2021-07-27 DIAGNOSIS — Z96651 Presence of right artificial knee joint: Secondary | ICD-10-CM | POA: Diagnosis not present

## 2021-07-27 DIAGNOSIS — Z471 Aftercare following joint replacement surgery: Secondary | ICD-10-CM | POA: Diagnosis not present

## 2021-07-29 DIAGNOSIS — M009 Pyogenic arthritis, unspecified: Secondary | ICD-10-CM | POA: Diagnosis not present

## 2021-07-29 DIAGNOSIS — Z96659 Presence of unspecified artificial knee joint: Secondary | ICD-10-CM | POA: Diagnosis not present

## 2021-07-29 DIAGNOSIS — T8459XA Infection and inflammatory reaction due to other internal joint prosthesis, initial encounter: Secondary | ICD-10-CM | POA: Diagnosis not present

## 2021-08-02 DIAGNOSIS — T8450XD Infection and inflammatory reaction due to unspecified internal joint prosthesis, subsequent encounter: Secondary | ICD-10-CM | POA: Diagnosis not present

## 2021-08-02 DIAGNOSIS — T8459XD Infection and inflammatory reaction due to other internal joint prosthesis, subsequent encounter: Secondary | ICD-10-CM | POA: Diagnosis not present

## 2021-08-16 DIAGNOSIS — I700 Atherosclerosis of aorta: Secondary | ICD-10-CM | POA: Diagnosis not present

## 2021-08-16 DIAGNOSIS — M109 Gout, unspecified: Secondary | ICD-10-CM | POA: Diagnosis not present

## 2021-08-16 DIAGNOSIS — I2782 Chronic pulmonary embolism: Secondary | ICD-10-CM | POA: Diagnosis not present

## 2021-08-16 DIAGNOSIS — R7303 Prediabetes: Secondary | ICD-10-CM | POA: Diagnosis not present

## 2021-08-16 DIAGNOSIS — T8459XD Infection and inflammatory reaction due to other internal joint prosthesis, subsequent encounter: Secondary | ICD-10-CM | POA: Diagnosis not present

## 2021-08-16 DIAGNOSIS — I509 Heart failure, unspecified: Secondary | ICD-10-CM | POA: Diagnosis not present

## 2021-08-16 DIAGNOSIS — I25119 Atherosclerotic heart disease of native coronary artery with unspecified angina pectoris: Secondary | ICD-10-CM | POA: Diagnosis not present

## 2021-08-16 DIAGNOSIS — M064 Inflammatory polyarthropathy: Secondary | ICD-10-CM | POA: Diagnosis not present

## 2021-08-16 DIAGNOSIS — E7800 Pure hypercholesterolemia, unspecified: Secondary | ICD-10-CM | POA: Diagnosis not present

## 2021-08-16 DIAGNOSIS — Z0000 Encounter for general adult medical examination without abnormal findings: Secondary | ICD-10-CM | POA: Diagnosis not present

## 2021-08-16 DIAGNOSIS — Z96659 Presence of unspecified artificial knee joint: Secondary | ICD-10-CM | POA: Diagnosis not present

## 2021-08-23 ENCOUNTER — Telehealth: Payer: Self-pay | Admitting: *Deleted

## 2021-08-23 NOTE — Telephone Encounter (Signed)
   [facility] Pre-operative Risk Assessment    Patient Name: Patrick Fritz  DOB: 10-Aug-1951 MRN: [id number]  HEARTCARE STAFF:  - IMPORTANT!!!!!! Under Visit Info/Reason for Call, type in Other and utilize the format Clearance MM/DD/YY or Clearance TBD. Do not use dashes or single digits. - Please review there is not already an duplicate clearance open for this procedure. - If request is for dental extraction, please clarify the # of teeth to be extracted. - If the patient is currently at the dentist's office, call Pre-Op Callback Staff (MA/nurse) to input urgent request.  - If the patient is not currently in the dentist office, please route to the Pre-Op pool.  Request for surgical clearance:  What type of surgery is being performed?  RIGHT KNEE REMOVAL SPACER REVISION TKA   When is this surgery scheduled?  TBD  What type of clearance is required (medical clearance vs. Pharmacy clearance to hold med vs. Both)?  MEDICAL  Are there any medications that need to be held prior to surgery and how long?  N/A  Practice name and name of physician performing surgery?  [facility] / DR. Smejkal  What is the office phone number?  [phone]   7.   What is the office fax number?  [phone] ATTN:  KERRI MAZE  8.   Anesthesia type (None, local, MAC, general) ?  SPINAL ANESTHESIA     Jeanann Lewandowsky 08/23/2021, 12:41 PM  _________________________________________________________________   (provider comments below)

## 2021-08-24 ENCOUNTER — Ambulatory Visit: Payer: Medicare Other | Admitting: Adult Health

## 2021-08-24 NOTE — Telephone Encounter (Signed)
Hi Dr. Tamala Julian. Patrick Fritz has plans for upcoming right knee surgery. You last saw him in 06/2021. He was doing well from a cardiac standpoint at that time. You ordered a repeat Echo to reassess LV function (EF had dropped to 30-35% in 01/2021 and GDMT was optimized). I called and spoke with him today. He is doing very well from a cardiac standpoint. No chest pain, acute CHF symptoms, palpitations, or syncope. He is staying very active and able to complete >4.0 METS without any problems. I think he is at acceptable risk for surgery. However, do you want him to have this repeat Echo prior to his surgery? It is currently scheduled for 10/03/2021.  Please route response to P [facility] PREOP.  Thank you!  FYI for pre-op covering APP - Patient is on Xarelto for history of PE. PCP manages this. I advised patient that we would defer recommendation for holding Xarelto to PCP.

## 2021-08-24 NOTE — Progress Notes (Deleted)
[facility] [address] [place] [address] [phone]       HOSPITAL FOLLOW UP NOTE  Mr. VICKY MCCANLESS Date of Birth:  [birth year] Medical Record Number:  [id number]   Reason for Referral:  hospital stroke follow up    SUBJECTIVE:   CHIEF COMPLAINT:  No chief complaint on file.   HPI:   Update 08/24/2021 JM: Returns for 28-month follow-up  Reports episode in August: sitting in chair, hands started jerking and head fell back.  Unable to speak for 1 to 2 minutes.  Did not lose consciousness. Confusion/fatigue ***. EMG eval with normal vitals. Was not transported to ED.  Wife called office 9/9 as [facility] nurse advised to report episode - attempted to schedule sooner f/u visit but unable to contact (attempted 3x's). No recurrent episode since that time.  Denies prior similar episode. Per chart review, was admitted 8/9-8/14 for right knee PJI with Strep anginosis s/p resection arthroplasty with antibiotic spacer and 4 weeks of ceftriaxone via PICC and 2 additional weeks PO Cefadroxil.   No stroke/TIA symptoms or additional seizure like activity since that time.  Compliant on Xarelto and simvastatin -denies side effects Blood pressure today ***       History provided for reference purposes only Initial visit 02/22/2021 JM: Mr. Sulton is being seen for hospital follow-up accompanied by his wife, [name]  Doing well from stroke standpoint without new or reoccurring symptoms.  He does report occasional short-term memory difficulty or delayed recall but this has been on going C/o intermittent dizziness with one episode of passing out -cardiology aware who felt likely due to slight orthostasis with recent medication adjustments Also reports right leg calf and thigh pain as well as swelling that has been improving. Per wife, cardiology evaluated yesterday and no emergent concerns reported - defer ongoing monitoring to cardiology  Compliant on Xarelto and simvastatin  without associated side effects Blood pressure today 114/67 - occasionally monitors at home   No further concerns at this time   Stroke admission 01/22/2021 Mr. KAJUAN GUYTON is a 70 y.o. male with history of of DJD of lumbar spine, frequent lower back pain, history of bilateral PE, history of DVT, glaucoma, hyperlipidemia, hypertension, impaired fasting glucose, OSA no longer on BiPAP, CAD/CABG (per patient required defibrillation during CABG) who presented  on 01/22/2021 with AMS and recent episode with dysarthria. Patient symptoms resolved by the time he presented to the ED and his AMS improved on his initial exam.  Personally reviewed hospitalization pertinent progress notes, lab work and imaging with summary provided.  CT head and MRI negative for acute stroke and symptoms likely in setting of TIA vs possible unwitnessed seizure with postictal confusion as symptoms lasted for approximately 2-2.5hrs and pt unable to recall episode.  EEG negative.  Recommended continuation of Xarelto for secondary stroke prevention with history of DVT and PE.  2D echo mild worsening of HFrEF from 40 to 45% in 2017 to now 30 to 35% -recommended outpatient follow-up with established cardiologist.  LDL 55.6 on simvastatin.  Evaluated by therapy and discharged home in stable condition without therapy needs.  Posterior circulation TIA vs unwitnessed seizure with post ictal presentation Code Stroke : CT head No acute abnormality. Small vessel disease. ASPECTS 10.  CTA head & neck: No high grade stenosis of the intracranial arteries and no Hemodynamically significant stenosis of bilateral carotids CT perfusion: negative MRI : No acute findings, chronic small vessel dx and small remote frontal  lobe infarct 2D Echo: LVEF 30-35%, LV regional wall abnormalities (septal and inferior wall hypokinesis), Mild AVR, L atrium mildy enlarged, Mild MVR, EEG no evidence of seizure activity LDL 55.6 HgbA1c 5.6 VTE prophylaxis -  SCDs aspirin 81 mg daily and Xarelto (rivaroxaban) daily prior to admission will restart ,Xarelto (rivaroxaban) daily.  Additional aspirin not necessary from a stroke standpoint Therapy recommendations: none Disposition:  Home    ROS:   14 system review of systems performed and negative with exception of those listed in HPI  PMH:  Past Medical History:  Diagnosis Date   DJD (degenerative joint disease), lumbar    DVT (deep venous thrombosis) (HCC)    Family history of adverse reaction to anesthesia    Glaucoma    R eye diminished vision approx 50%   High cholesterol    Hyperlipidemia    Hypertension    IFG (impaired fasting glucose)    OSA (obstructive sleep apnea)    Severe w AHI 34/hr on HST no on BiPAP at 19/15cm H2O   PE (pulmonary embolism)    Second occurance,enknown etiology,lifelong anticoagulation    PSH:  Past Surgical History:  Procedure Laterality Date   CARDIAC CATHETERIZATION N/A 05/14/2016   Procedure: Left Heart Cath and Coronary Angiography;  Surgeon: Belva Crome, MD;  Location: [facility];  Service: Cardiovascular;  Laterality: N/A;   CORONARY ARTERY BYPASS GRAFT N/A 05/14/2016   Procedure: CORONARY ARTERY BYPASS GRAFTING (CABG) times two with LIMA to LAD and right leg SVG to PDA,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;  Surgeon: Ivin Poot, MD;  Location: [facility];  Service: Open Heart Surgery;  Laterality: N/A;   EXCISIONAL TOTAL KNEE ARTHROPLASTY WITH ANTIBIOTIC SPACERS Right 05/26/2021   Procedure: EXCISIONAL TOTAL KNEE ARTHROPLASTY WITH ANTIBIOTIC SPACERS;  Surgeon: Rod Can, MD;  Location: [facility];  Service: Orthopedics;  Laterality: Right;   KNEE SURGERY Bilateral    arthroscopic   TOTAL KNEE ARTHROPLASTY Bilateral 1996    Social History:  Social History   Socioeconomic History   Marital status: Married    Spouse name: Not on file   Number of children: Not on file   Years of education: Not on file   Highest education  level: Not on file  Occupational History   Not on file  Tobacco Use   Smoking status: Never   Smokeless tobacco: Never  Vaping Use   Vaping Use: Never used  Substance and Sexual Activity   Alcohol use: Yes    Comment: 1-2 per week   Drug use: Never   Sexual activity: Not on file  Other Topics Concern   Not on file  Social History Narrative   Not on file   Social Determinants of Health   Financial Resource Strain: Not on file  Food Insecurity: Not on file  Transportation Needs: Not on file  Physical Activity: Not on file  Stress: Not on file  Social Connections: Not on file  Intimate Partner Violence: Not on file    Family History:  Family History  Problem Relation Age of Onset   Osteoporosis Mother    Hypertension Father    CAD Father     Medications:   Current Outpatient Medications on File Prior to Visit  Medication Sig Dispense Refill   acetaminophen (TYLENOL) 325 MG tablet Take 650 mg by mouth every 6 (six) hours as needed for moderate pain.     allopurinol (ZYLOPRIM) 300 MG tablet Take 300 mg by mouth daily.  carvedilol (COREG) 3.125 MG tablet Take 1 tablet (3.125 mg total) by mouth 2 (two) times daily. 180 tablet 3   dapagliflozin propanediol (FARXIGA) 10 MG TABS tablet Take 1 tablet (10 mg total) by mouth daily before breakfast. 90 tablet 3   diclofenac Sodium (VOLTAREN) 1 % GEL Apply 1 application topically daily as needed for pain.     diphenhydramine-acetaminophen (TYLENOL PM) 25-500 MG TABS tablet Take 2 tablets by mouth at bedtime as needed (pain).     gabapentin (NEURONTIN) 100 MG capsule Take 100 mg by mouth 2 (two) times daily.     HYDROcodone-acetaminophen (NORCO) 7.5-325 MG tablet Take 1-2 tablets by mouth every 4 (four) hours as needed for severe pain (pain score 7-10). 30 tablet 0   losartan (COZAAR) 50 MG tablet Take 25 mg by mouth daily.     rivaroxaban (XARELTO) 20 MG TABS tablet Take 1 tablet (20 mg total) by mouth daily with supper. 30  tablet 2   sertraline (ZOLOFT) 100 MG tablet Take 150 mg by mouth daily.     simvastatin (ZOCOR) 40 MG tablet Take 1 tablet (40 mg total) by mouth at bedtime. 90 tablet 3   spironolactone (ALDACTONE) 25 MG tablet Take 0.5 tablets (12.5 mg total) by mouth daily. 45 tablet 3   No current facility-administered medications on file prior to visit.    Allergies:   No Active Allergies     OBJECTIVE:  Physical Exam  There were no vitals filed for this visit.  There is no height or weight on file to calculate BMI. No results found.  General: well developed, well nourished, pleasant elderly Caucasian male, seated, in no evident distress Head: head normocephalic and atraumatic.   Neck: supple with no carotid or supraclavicular bruits Cardiovascular: regular rate and rhythm, no murmurs Musculoskeletal: no deformity Skin:  no rash/petichiae Vascular:  Normal pulses all extremities   Neurologic Exam Mental Status: Awake and fully alert.   Fluent speech and language.  Oriented to place and time. Recent memory subjectively mildly impaired and remote memory intact. Attention span, concentration and fund of knowledge appropriate. Mood and affect appropriate.  Cranial Nerves: Pupils equal, briskly reactive to light. Extraocular movements full without nystagmus. Visual fields full to confrontation. Hearing intact. Facial sensation intact. Face, tongue, palate moves normally and symmetrically.  Motor: Normal bulk and tone. Normal strength in all tested extremity muscles Sensory.: intact to touch , pinprick , position and vibratory sensation.  Coordination: Rapid alternating movements normal in all extremities. Finger-to-nose and heel-to-shin performed accurately bilaterally. Gait and Station: Arises from chair without difficulty. Stance is normal. Gait demonstrates normal stride length and balance without use of assistive device. Tandem walk and heel toe without difficulty.  Reflexes: 1+ and  symmetric. Toes downgoing.          ASSESSMENT: NOEMI ISHMAEL is a 70 y.o. year old male with posterior circulation TIA vs unwitnessed seizure with postictal presentation on 01/22/2021 after presenting with altered mental status and dysarthria. Vascular risk factors include HLD, hx of PE and DVT on Xarelto, HTN, OSA not on BiPAP, CHF and CAD s/p CABG. Reports possible seizure episode in August with hands jerking and had felt back and inability to speak for 1 to 2 minutes     PLAN:  Possible seizure:  TIA vs seizure: Has not had any recurrence or new stroke/TIA symptoms.  Continue Xarelto (rivaroxaban) daily  and simvastatin for secondary stroke prevention.  Discussed secondary stroke prevention measures and importance of  close PCP follow up for aggressive stroke risk factor management  HTN: BP goal <130/90.  Stable today with occasional dizziness with quick position changes -medications currently being adjusted by cardiology and advised continued follow-up with their office HLD: LDL goal <70. Recent LDL 55.6 on simvastatin 40 mg daily.  Hx of PE/DVT: [facility]    Follow up in 6 months or call earlier if needed   CC:  [place] provider: Dr. Leonie Man PCP: Mayra Neer, MD    I spent 47 minutes of face-to-face and non-face-to-face time with patient and wife.  This included previsit chart review including recent hospitalization pertinent progress notes, lab work and imaging with extensive review regarding recent episode possibly TIA vs seizures and further monitoring, secondary stroke prevention measures and importance of close PCP follow-up for aggressive stroke risk factor management as well as compliance for all prescribed medications, history of PE/DVT and use of Xarelto chronically and answered all other questions to patient satisfaction  Frann Rider, AGNP-BC  [facility] [address] [place], [place] [address]  Phone  [phone] Fax [phone] Note: This document was prepared with digital dictation and possible smart phrase technology. Any transcriptional errors that result from this process are unintentional.

## 2021-08-29 DIAGNOSIS — T8459XA Infection and inflammatory reaction due to other internal joint prosthesis, initial encounter: Secondary | ICD-10-CM | POA: Diagnosis not present

## 2021-08-29 DIAGNOSIS — M009 Pyogenic arthritis, unspecified: Secondary | ICD-10-CM | POA: Diagnosis not present

## 2021-08-29 DIAGNOSIS — Z96659 Presence of unspecified artificial knee joint: Secondary | ICD-10-CM | POA: Diagnosis not present

## 2021-09-07 NOTE — Progress Notes (Signed)
Sent message, via epic in basket, requesting orders in epic from surgeon.  

## 2021-09-07 NOTE — Progress Notes (Addendum)
COVID swab appointment:09/20/21  COVID Vaccine Completed: yes x4 Date COVID Vaccine completed: 12/25/19, 01/22/20 Has received booster: 11/13/20, 03/21/21 COVID vaccine manufacturer: [place]   Date of COVID positive in last 90 days: no  PCP - Mayra Neer, MD Cardiologist - Daneen Schick, MD  Chest x-ray - n/a EKG - 01/24/21 Epic Stress Test - 2014 ECHO - 01/22/21 Epic Cardiac Cath - 2017 Pacemaker/ICD device last checked: n/a Spinal Cord Stimulator:n/a  Sleep Study - yes positive  CPAP - stopped using 8 years ago  Fasting Blood Sugar - n/a Checks Blood Sugar _____ times a day  Blood Thinner Instructions: Xarelto, stop 2-3 days Aspirin Instructions: Last Dose:  Activity level: Can go up a flight of stairs and perform activities of daily living without stopping and without symptoms of chest pain or shortness of breath.    Anesthesia review: DVT, PE, CAD , OSA, CABG, HTN, TIA, SOB, STEMI, PT 23.2, INR 2.1  Patient denies shortness of breath, fever, cough and chest pain at PAT appointment   Patient verbalized understanding of instructions that were given to them at the PAT appointment. Patient was also instructed that they will need to review over the PAT instructions again at home before surgery.

## 2021-09-09 ENCOUNTER — Other Ambulatory Visit (HOSPITAL_COMMUNITY): Payer: Self-pay

## 2021-09-12 ENCOUNTER — Ambulatory Visit: Payer: Self-pay | Admitting: Student

## 2021-09-12 DIAGNOSIS — Z01818 Encounter for other preprocedural examination: Secondary | ICD-10-CM

## 2021-09-12 NOTE — H&P (Signed)
TOTAL KNEE REVISION ADMISSION H&P  Patient is being admitted for right revision total knee arthroplasty.  Subjective:  Chief Complaint:right knee pain.  HPI: Patrick Fritz, 70 y.o. male, has a history of pain and functional disability in the right knee(s) due to failed previous arthroplasty and patient has failed non-surgical conservative treatments for greater than 12 weeks to include NSAID's and/or analgesics and activity modification. The indications for the revision of the total knee arthroplasty are history of total knee infection. Onset of symptoms was gradual starting 2 years ago with gradually worsening course since that time.  Prior procedures on the right knee(s) include arthroplasty.  Patient currently rates pain in the right knee(s) at 7 out of 10 with activity. There is worsening of pain with activity and weight bearing, pain that interferes with activities of daily living, and pain with passive range of motion.  Patient has evidence of  temporary spacer  by imaging studies. This condition presents safety issues increasing the risk of falls.  There is no current active infection.  Patient Active Problem List   Diagnosis Date Noted   Infection of total knee replacement (HCC)    Septic arthritis of knee, right ([place]) 05/24/2021   TIA (transient ischemic attack) 01/22/2021   Gout 01/22/2021   Glaucoma    Hyperlipidemia    CAD (coronary artery disease)    Aortic atherosclerosis (HCC)    History of DVT in adulthood    Hypertension    Bilateral pulmonary embolism ([place]) 07/12/2016   S/P CABG x 2 05/22/2016   Hx of ascending aorta replacement 05/22/2016   Chest pain    History of ST elevation myocardial infarction (STEMI)    Obstructive sleep apnea 12/16/2013   Morbid obesity ([place]) 12/16/2013   SOB (shortness of breath) 12/16/2013   Past Medical History:  Diagnosis Date   DJD (degenerative joint disease), lumbar    DVT (deep venous thrombosis) (HCC)    Family history of  adverse reaction to anesthesia    Glaucoma    R eye diminished vision approx 50%   High cholesterol    Hyperlipidemia    Hypertension    IFG (impaired fasting glucose)    OSA (obstructive sleep apnea)    Severe w AHI 34/hr on HST no on BiPAP at 19/15cm H2O   PE (pulmonary embolism)    Second occurance,enknown etiology,lifelong anticoagulation    Past Surgical History:  Procedure Laterality Date   CARDIAC CATHETERIZATION N/A 05/14/2016   Procedure: Left Heart Cath and Coronary Angiography;  Surgeon: Belva Crome, MD;  Location: [facility];  Service: Cardiovascular;  Laterality: N/A;   CORONARY ARTERY BYPASS GRAFT N/A 05/14/2016   Procedure: CORONARY ARTERY BYPASS GRAFTING (CABG) times two with LIMA to LAD and right leg SVG to PDA,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;  Surgeon: Ivin Poot, MD;  Location: [facility];  Service: Open Heart Surgery;  Laterality: N/A;   EXCISIONAL TOTAL KNEE ARTHROPLASTY WITH ANTIBIOTIC SPACERS Right 05/26/2021   Procedure: EXCISIONAL TOTAL KNEE ARTHROPLASTY WITH ANTIBIOTIC SPACERS;  Surgeon: Rod Can, MD;  Location: [facility];  Service: Orthopedics;  Laterality: Right;   KNEE SURGERY Bilateral    arthroscopic   TOTAL KNEE ARTHROPLASTY Bilateral 1996    Current Outpatient Medications  Medication Sig Dispense Refill Last Dose   acetaminophen (TYLENOL) 325 MG tablet Take 650 mg by mouth every 6 (six) hours as needed for moderate pain.      allopurinol (ZYLOPRIM) 300 MG tablet Take  300 mg by mouth daily.       carvedilol (COREG) 3.125 MG tablet Take 1 tablet (3.125 mg total) by mouth 2 (two) times daily. 180 tablet 3    dapagliflozin propanediol (FARXIGA) 10 MG TABS tablet Take 1 tablet (10 mg total) by mouth daily before breakfast. 90 tablet 3    diclofenac Sodium (VOLTAREN) 1 % GEL Apply 1 application topically daily as needed for pain.      gabapentin (NEURONTIN) 100 MG capsule Take 100 mg by mouth in the morning.       HYDROcodone-acetaminophen (NORCO) 7.5-325 MG tablet Take 1-2 tablets by mouth every 4 (four) hours as needed for severe pain (pain score 7-10). (Patient not taking: Reported on 09/07/2021) 30 tablet 0    losartan (COZAAR) 25 MG tablet Take 25 mg by mouth daily.      rivaroxaban (XARELTO) 20 MG TABS tablet Take 1 tablet (20 mg total) by mouth daily with supper. (Patient taking differently: Take 20 mg by mouth at bedtime.) 30 tablet 2    sertraline (ZOLOFT) 100 MG tablet Take 150 mg by mouth daily.      simvastatin (ZOCOR) 40 MG tablet Take 1 tablet (40 mg total) by mouth at bedtime. 90 tablet 3    spironolactone (ALDACTONE) 25 MG tablet Take 0.5 tablets (12.5 mg total) by mouth daily. 45 tablet 3    traMADol (ULTRAM) 50 MG tablet Take 50 mg by mouth at bedtime.      No current facility-administered medications for this visit.   No Known Allergies  Social History   Tobacco Use   Smoking status: Never   Smokeless tobacco: Never  Substance Use Topics   Alcohol use: Yes    Comment: 1-2 per week    Family History  Problem Relation Age of Onset   Osteoporosis Mother    Hypertension Father    CAD Father       Review of Systems  Musculoskeletal:  Positive for arthralgias.  All other systems reviewed and are negative.   Objective:  Physical Exam HENT:     Head: Normocephalic.  Eyes:     Pupils: Pupils are equal, round, and reactive to light.  Cardiovascular:     Rate and Rhythm: Normal rate.  Pulmonary:     Effort: Pulmonary effort is normal.  Abdominal:     Palpations: Abdomen is soft.  Genitourinary:    Comments: Deferred Musculoskeletal:        General: Tenderness present.     Cervical back: Normal range of motion.  Skin:    General: Skin is warm.  Neurological:     Mental Status: He is alert and oriented to person, place, and time.  Psychiatric:        Behavior: Behavior normal.    Vital signs in last 24 hours: @VSRANGES @  Labs:  Estimated body mass index is  31.28 kg/m as calculated from the following:   Height as of 07/13/21: 5\' 10"  (1.778 m).   Weight as of 07/13/21: 98.9 kg.  Imaging Review Plain radiographs demonstrate severe degenerative joint disease of the right knee(s). There is evidence of loosening of the femoral components. The bone quality appears to be adequate for age and reported activity level.     Assessment/Plan:  End stage arthritis, right knee(s) with failed previous arthroplasty.   The patient history, physical examination, clinical judgment of the provider and imaging studies are consistent with end stage degenerative joint disease of the right knee(s), previous total knee arthroplasty.  Revision total knee arthroplasty is deemed medically necessary. The treatment options including medical management, injection therapy, arthroscopy and revision arthroplasty were discussed at length. The risks and benefits of revision total knee arthroplasty were presented and reviewed. The risks due to aseptic loosening, infection, stiffness, patella tracking problems, thromboembolic complications and other imponderables were discussed. The patient acknowledged the explanation, agreed to proceed with the plan and consent was signed. Patient is being admitted for inpatient treatment for surgery, pain control, PT, OT, prophylactic antibiotics, VTE prophylaxis, progressive ambulation and ADL's and discharge planning.The patient is planning to be discharged  home

## 2021-09-12 NOTE — Patient Instructions (Addendum)
DUE TO COVID-19 ONLY ONE VISITOR IS ALLOWED TO COME WITH YOU AND STAY IN THE WAITING ROOM ONLY DURING PRE OP AND PROCEDURE.   **NO VISITORS ARE ALLOWED IN THE SHORT STAY AREA OR RECOVERY ROOM!!**  IF YOU WILL BE ADMITTED INTO THE HOSPITAL YOU ARE ALLOWED ONLY TWO SUPPORT PEOPLE DURING VISITATION HOURS ONLY (10AM -8PM)   The support person(s) may change daily. The support person(s) must pass our screening, gel in and out, and wear a mask at all times, including in the patient's room. Patients must also wear a mask when staff or their support person are in the room.  No visitors under the age of 89. Any visitor under the age of 9 must be accompanied by an adult.    COVID SWAB TESTING MUST BE COMPLETED ON:  09/20/21 **MUST PRESENT COMPLETED FORM AT TESTING SITE**    [facility] (backside of the building) Open 8am-3pm. No appointment needed. You are not required to quarantine, however you are required to wear a well-fitted mask when you are out and around people not in your household.  Hand Hygiene often Do NOT share personal items Notify your provider if you are in close contact with someone who has COVID or you develop fever 100.4 or greater, new onset of sneezing, cough, sore throat, shortness of breath or body aches.   Your procedure is scheduled on: 09/22/21   Report to [facility] Main Entrance    Report to admitting at 11:15 AM   Call this number if you have problems the morning of surgery [phone]   Do not eat food :After Midnight.   May have liquids until 11:00 am day of surgery  CLEAR LIQUID DIET  Foods Allowed                                                                     Foods Excluded  Water, Black Coffee and tea (no milk or creamer)           liquids that you cannot  Plain Jell-O in any flavor  (No red)                                    see through such as: Fruit ices (not with fruit pulp)                                            milk, soups, orange juice              Iced Popsicles (No red)                                               All solid food                                   Apple juices Sports drinks like  Gatorade (No red) Lightly seasoned clear broth or consume(fat free) Sugar     The day of surgery:  Drink ONE (1) Pre-Surgery Clear Ensure by 11:00 am the morning of surgery. Drink in one sitting. Do not sip.  This drink was given to you during your hospital  pre-op appointment visit. Nothing else to drink after completing the  Pre-Surgery Clear Ensure.          If you have questions, please contact your surgeon's office.     Oral Hygiene is also important to reduce your risk of infection.                                    Remember - BRUSH YOUR TEETH THE MORNING OF SURGERY WITH YOUR REGULAR TOOTHPASTE   Take these medicines the morning of surgery with A SIP OF WATER: Tylenol, Allopurinol, Coreg, Gabapentin, Zoloft                              You may not have any metal on your body including jewelry, and body piercing             Do not wear lotions, powders, cologne, or deodorant              Men may shave face and neck.   Do not bring valuables to the hospital. [place].   Bring small overnight bag day of surgery.               Please read over the following fact sheets you were given: IF YOU HAVE QUESTIONS ABOUT YOUR PRE-OP INSTRUCTIONS PLEASE CALL [facility] - Preparing for Surgery Before surgery, you can play an important role.  Because skin is not sterile, your skin needs to be as free of germs as possible.  You can reduce the number of germs on your skin by washing with CHG (chlorahexidine gluconate) soap before surgery.  CHG is an antiseptic cleaner which kills germs and bonds with the skin to continue killing germs even after washing. Please DO NOT use if you have an allergy to CHG or antibacterial soaps.  If  your skin becomes reddened/irritated stop using the CHG and inform your nurse when you arrive at Short Stay. Do not shave (including legs and underarms) for at least 48 hours prior to the first CHG shower.  You may shave your face/neck.  Please follow these instructions carefully:  1.  Shower with CHG Soap the night before surgery and the  morning of surgery.  2.  If you choose to wash your hair, wash your hair first as usual with your normal  shampoo.  3.  After you shampoo, rinse your hair and body thoroughly to remove the shampoo.                             4.  Use CHG as you would any other liquid soap.  You can apply chg directly to the skin and wash.  Gently with a scrungie or clean washcloth.  5.  Apply the CHG Soap to your body ONLY FROM THE NECK DOWN.   Do   not use on face/ open  Wound or open sores. Avoid contact with eyes, ears mouth and   genitals (private parts).                       Wash face,  Genitals (private parts) with your normal soap.             6.  Wash thoroughly, paying special attention to the area where your    surgery  will be performed.  7.  Thoroughly rinse your body with warm water from the neck down.  8.  DO NOT shower/wash with your normal soap after using and rinsing off the CHG Soap.                9.  Pat yourself dry with a clean towel.            10.  Wear clean pajamas.            11.  Place clean sheets on your bed the night of your first shower and do not  sleep with pets. Day of Surgery : Do not apply any lotions/deodorants the morning of surgery.  Please wear clean clothes to the hospital/surgery center.  FAILURE TO FOLLOW THESE INSTRUCTIONS MAY RESULT IN THE CANCELLATION OF YOUR SURGERY  PATIENT SIGNATURE_________________________________  NURSE SIGNATURE__________________________________  ________________________________________________________________________   Patrick Fritz  An incentive spirometer is a  tool that can help keep your lungs clear and active. This tool measures how well you are filling your lungs with each breath. Taking long deep breaths may help reverse or decrease the chance of developing breathing (pulmonary) problems (especially infection) following: A long period of time when you are unable to move or be active. BEFORE THE PROCEDURE  If the spirometer includes an indicator to show your best effort, your nurse or respiratory therapist will set it to a desired goal. If possible, sit up straight or lean slightly forward. Try not to slouch. Hold the incentive spirometer in an upright position. INSTRUCTIONS FOR USE  Sit on the edge of your bed if possible, or sit up as far as you can in bed or on a chair. Hold the incentive spirometer in an upright position. Breathe out normally. Place the mouthpiece in your mouth and seal your lips tightly around it. Breathe in slowly and as deeply as possible, raising the piston or the ball toward the top of the column. Hold your breath for 3-5 seconds or for as long as possible. Allow the piston or ball to fall to the bottom of the column. Remove the mouthpiece from your mouth and breathe out normally. Rest for a few seconds and repeat Steps 1 through 7 at least 10 times every 1-2 hours when you are awake. Take your time and take a few normal breaths between deep breaths. The spirometer may include an indicator to show your best effort. Use the indicator as a goal to work toward during each repetition. After each set of 10 deep breaths, practice coughing to be sure your lungs are clear. If you have an incision (the cut made at the time of surgery), support your incision when coughing by placing a pillow or rolled up towels firmly against it. Once you are able to get out of bed, walk around indoors and cough well. You may stop using the incentive spirometer when instructed by your caregiver.  RISKS AND COMPLICATIONS Take your time so you do not  get dizzy or light-headed. If you are in pain, you  may need to take or ask for pain medication before doing incentive spirometry. It is harder to take a deep breath if you are having pain. AFTER USE Rest and breathe slowly and easily. It can be helpful to keep track of a log of your progress. Your caregiver can provide you with a simple table to help with this. If you are using the spirometer at home, follow these instructions: [place] IF:  You are having difficultly using the spirometer. You have trouble using the spirometer as often as instructed. Your pain medication is not giving enough relief while using the spirometer. You develop fever of 100.5 F (38.1 C) or higher. SEEK IMMEDIATE MEDICAL CARE IF:  You cough up bloody sputum that had not been present before. You develop fever of 102 F (38.9 C) or greater. You develop worsening pain at or near the incision site. MAKE SURE YOU:  Understand these instructions. Will watch your condition. Will get help right away if you are not doing well or get worse. Document Released: 02/12/2007 Document Revised: 12/25/2011 Document Reviewed: 04/15/2007 Exit[employer] Patient Information 2014 [employer], [place].   ________________________________________________________________________  WHAT IS A BLOOD TRANSFUSION? Blood Transfusion Information  A transfusion is the replacement of blood or some of its parts. Blood is made up of multiple cells which provide different functions. Red blood cells carry oxygen and are used for blood loss replacement. White blood cells fight against infection. Platelets control bleeding. Plasma helps clot blood. Other blood products are available for specialized needs, such as hemophilia or other clotting disorders. BEFORE THE TRANSFUSION  Who gives blood for transfusions?  Healthy volunteers who are fully evaluated to make sure their blood is safe. This is blood bank blood. Transfusion therapy is the safest it  has ever been in the practice of medicine. Before blood is taken from a donor, a complete history is taken to make sure that person has no history of diseases nor engages in risky social behavior (examples are intravenous drug use or sexual activity with multiple partners). The donor's travel history is screened to minimize risk of transmitting infections, such as malaria. The donated blood is tested for signs of infectious diseases, such as HIV and hepatitis. The blood is then tested to be sure it is compatible with you in order to minimize the chance of a transfusion reaction. If you or a relative donates blood, this is often done in anticipation of surgery and is not appropriate for emergency situations. It takes many days to process the donated blood. RISKS AND COMPLICATIONS Although transfusion therapy is very safe and saves many lives, the main dangers of transfusion include:  Getting an infectious disease. Developing a transfusion reaction. This is an allergic reaction to something in the blood you were given. Every precaution is taken to prevent this. The decision to have a blood transfusion has been considered carefully by your caregiver before blood is given. Blood is not given unless the benefits outweigh the risks. AFTER THE TRANSFUSION Right after receiving a blood transfusion, you will usually feel much better and more energetic. This is especially true if your red blood cells have gotten low (anemic). The transfusion raises the level of the red blood cells which carry oxygen, and this usually causes an energy increase. The nurse administering the transfusion will monitor you carefully for complications. HOME CARE INSTRUCTIONS  No special instructions are needed after a transfusion. You may find your energy is better. Speak with your caregiver about any limitations on activity  for underlying diseases you may have. SEEK MEDICAL CARE IF:  Your condition is not improving after your  transfusion. You develop redness or irritation at the intravenous (IV) site. SEEK IMMEDIATE MEDICAL CARE IF:  Any of the following symptoms occur over the next 12 hours: Shaking chills. You have a temperature by mouth above 102 F (38.9 C), not controlled by medicine. Chest, back, or muscle pain. People around you feel you are not acting correctly or are confused. Shortness of breath or difficulty breathing. Dizziness and fainting. You get a rash or develop hives. You have a decrease in urine output. Your urine turns a dark color or changes to pink, red, or brown. Any of the following symptoms occur over the next 10 days: You have a temperature by mouth above 102 F (38.9 C), not controlled by medicine. Shortness of breath. Weakness after normal activity. The white part of the eye turns yellow (jaundice). You have a decrease in the amount of urine or are urinating less often. Your urine turns a dark color or changes to pink, red, or brown. Document Released: 09/29/2000 Document Revised: 12/25/2011 Document Reviewed: 05/18/2008 [facility] Patient Information 2014 [place], [place].  _______________________________________________________________________

## 2021-09-12 NOTE — H&P (View-Only) (Signed)
TOTAL KNEE REVISION ADMISSION H&P  Patient is being admitted for right revision total knee arthroplasty.  Subjective:  Chief Complaint:right knee pain.  HPI: Patrick Fritz, 70 y.o. male, has a history of pain and functional disability in the right knee(s) due to failed previous arthroplasty and patient has failed non-surgical conservative treatments for greater than 12 weeks to include NSAID's and/or analgesics and activity modification. The indications for the revision of the total knee arthroplasty are history of total knee infection. Onset of symptoms was gradual starting 2 years ago with gradually worsening course since that time.  Prior procedures on the right knee(s) include arthroplasty.  Patient currently rates pain in the right knee(s) at 7 out of 10 with activity. There is worsening of pain with activity and weight bearing, pain that interferes with activities of daily living, and pain with passive range of motion.  Patient has evidence of  temporary spacer  by imaging studies. This condition presents safety issues increasing the risk of falls.  There is no current active infection.  Patient Active Problem List   Diagnosis Date Noted   Infection of total knee replacement (HCC)    Septic arthritis of knee, right ([place]) 05/24/2021   TIA (transient ischemic attack) 01/22/2021   Gout 01/22/2021   Glaucoma    Hyperlipidemia    CAD (coronary artery disease)    Aortic atherosclerosis (HCC)    History of DVT in adulthood    Hypertension    Bilateral pulmonary embolism ([place]) 07/12/2016   S/P CABG x 2 05/22/2016   Hx of ascending aorta replacement 05/22/2016   Chest pain    History of ST elevation myocardial infarction (STEMI)    Obstructive sleep apnea 12/16/2013   Morbid obesity ([place]) 12/16/2013   SOB (shortness of breath) 12/16/2013   Past Medical History:  Diagnosis Date   DJD (degenerative joint disease), lumbar    DVT (deep venous thrombosis) (HCC)    Family history of  adverse reaction to anesthesia    Glaucoma    R eye diminished vision approx 50%   High cholesterol    Hyperlipidemia    Hypertension    IFG (impaired fasting glucose)    OSA (obstructive sleep apnea)    Severe w AHI 34/hr on HST no on BiPAP at 19/15cm H2O   PE (pulmonary embolism)    Second occurance,enknown etiology,lifelong anticoagulation    Past Surgical History:  Procedure Laterality Date   CARDIAC CATHETERIZATION N/A 05/14/2016   Procedure: Left Heart Cath and Coronary Angiography;  Surgeon: Belva Crome, MD;  Location: [facility];  Service: Cardiovascular;  Laterality: N/A;   CORONARY ARTERY BYPASS GRAFT N/A 05/14/2016   Procedure: CORONARY ARTERY BYPASS GRAFTING (CABG) times two with LIMA to LAD and right leg SVG to PDA,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;  Surgeon: Ivin Poot, MD;  Location: [place];  Service: Open Heart Surgery;  Laterality: N/A;   EXCISIONAL TOTAL KNEE ARTHROPLASTY WITH ANTIBIOTIC SPACERS Right 05/26/2021   Procedure: EXCISIONAL TOTAL KNEE ARTHROPLASTY WITH ANTIBIOTIC SPACERS;  Surgeon: Rod Can, MD;  Location: [facility];  Service: Orthopedics;  Laterality: Right;   KNEE SURGERY Bilateral    arthroscopic   TOTAL KNEE ARTHROPLASTY Bilateral 1996    Current Outpatient Medications  Medication Sig Dispense Refill Last Dose   acetaminophen (TYLENOL) 325 MG tablet Take 650 mg by mouth every 6 (six) hours as needed for moderate pain.      allopurinol (ZYLOPRIM) 300 MG tablet Take  300 mg by mouth daily.       carvedilol (COREG) 3.125 MG tablet Take 1 tablet (3.125 mg total) by mouth 2 (two) times daily. 180 tablet 3    dapagliflozin propanediol (FARXIGA) 10 MG TABS tablet Take 1 tablet (10 mg total) by mouth daily before breakfast. 90 tablet 3    diclofenac Sodium (VOLTAREN) 1 % GEL Apply 1 application topically daily as needed for pain.      gabapentin (NEURONTIN) 100 MG capsule Take 100 mg by mouth in the morning.       HYDROcodone-acetaminophen (NORCO) 7.5-325 MG tablet Take 1-2 tablets by mouth every 4 (four) hours as needed for severe pain (pain score 7-10). (Patient not taking: Reported on 09/07/2021) 30 tablet 0    losartan (COZAAR) 25 MG tablet Take 25 mg by mouth daily.      rivaroxaban (XARELTO) 20 MG TABS tablet Take 1 tablet (20 mg total) by mouth daily with supper. (Patient taking differently: Take 20 mg by mouth at bedtime.) 30 tablet 2    sertraline (ZOLOFT) 100 MG tablet Take 150 mg by mouth daily.      simvastatin (ZOCOR) 40 MG tablet Take 1 tablet (40 mg total) by mouth at bedtime. 90 tablet 3    spironolactone (ALDACTONE) 25 MG tablet Take 0.5 tablets (12.5 mg total) by mouth daily. 45 tablet 3    traMADol (ULTRAM) 50 MG tablet Take 50 mg by mouth at bedtime.      No current facility-administered medications for this visit.   No Known Allergies  Social History   Tobacco Use   Smoking status: Never   Smokeless tobacco: Never  Substance Use Topics   Alcohol use: Yes    Comment: 1-2 per week    Family History  Problem Relation Age of Onset   Osteoporosis Mother    Hypertension Father    CAD Father       Review of Systems  Musculoskeletal:  Positive for arthralgias.  All other systems reviewed and are negative.   Objective:  Physical Exam HENT:     Head: Normocephalic.  Eyes:     Pupils: Pupils are equal, round, and reactive to light.  Cardiovascular:     Rate and Rhythm: Normal rate.  Pulmonary:     Effort: Pulmonary effort is normal.  Abdominal:     Palpations: Abdomen is soft.  Genitourinary:    Comments: Deferred Musculoskeletal:        General: Tenderness present.     Cervical back: Normal range of motion.  Skin:    General: Skin is warm.  Neurological:     Mental Status: He is alert and oriented to person, place, and time.  Psychiatric:        Behavior: Behavior normal.    Vital signs in last 24 hours: @VSRANGES @  Labs:  Estimated body mass index is  31.28 kg/m as calculated from the following:   Height as of 07/13/21: 5\' 10"  (1.778 m).   Weight as of 07/13/21: 98.9 kg.  Imaging Review Plain radiographs demonstrate severe degenerative joint disease of the right knee(s). There is evidence of loosening of the femoral components. The bone quality appears to be adequate for age and reported activity level.     Assessment/Plan:  End stage arthritis, right knee(s) with failed previous arthroplasty.   The patient history, physical examination, clinical judgment of the provider and imaging studies are consistent with end stage degenerative joint disease of the right knee(s), previous total knee arthroplasty.  Revision total knee arthroplasty is deemed medically necessary. The treatment options including medical management, injection therapy, arthroscopy and revision arthroplasty were discussed at length. The risks and benefits of revision total knee arthroplasty were presented and reviewed. The risks due to aseptic loosening, infection, stiffness, patella tracking problems, thromboembolic complications and other imponderables were discussed. The patient acknowledged the explanation, agreed to proceed with the plan and consent was signed. Patient is being admitted for inpatient treatment for surgery, pain control, PT, OT, prophylactic antibiotics, VTE prophylaxis, progressive ambulation and ADL's and discharge planning.The patient is planning to be discharged  home

## 2021-09-13 ENCOUNTER — Encounter (HOSPITAL_COMMUNITY): Payer: Self-pay

## 2021-09-13 ENCOUNTER — Encounter (HOSPITAL_COMMUNITY)
Admission: RE | Admit: 2021-09-13 | Discharge: 2021-09-13 | Disposition: A | Discharge status: Home / Self Care | Payer: Medicare Other | Source: Ambulatory Visit | Attending: Orthopedic Surgery | Admitting: Orthopedic Surgery

## 2021-09-13 VITALS — BP 134/82 | HR 87 | Temp 97.7°F | Resp 14 | Ht 70.0 in | Wt 222.9 lb

## 2021-09-13 DIAGNOSIS — M1711 Unilateral primary osteoarthritis, right knee: Secondary | ICD-10-CM | POA: Diagnosis not present

## 2021-09-13 DIAGNOSIS — I110 Hypertensive heart disease with heart failure: Secondary | ICD-10-CM | POA: Insufficient documentation

## 2021-09-13 DIAGNOSIS — G4733 Obstructive sleep apnea (adult) (pediatric): Secondary | ICD-10-CM | POA: Diagnosis not present

## 2021-09-13 DIAGNOSIS — Z01818 Encounter for other preprocedural examination: Secondary | ICD-10-CM | POA: Diagnosis not present

## 2021-09-13 DIAGNOSIS — I509 Heart failure, unspecified: Secondary | ICD-10-CM | POA: Insufficient documentation

## 2021-09-13 DIAGNOSIS — Z951 Presence of aortocoronary bypass graft: Secondary | ICD-10-CM | POA: Insufficient documentation

## 2021-09-13 DIAGNOSIS — Z86718 Personal history of other venous thrombosis and embolism: Secondary | ICD-10-CM | POA: Diagnosis not present

## 2021-09-13 DIAGNOSIS — I2510 Atherosclerotic heart disease of native coronary artery without angina pectoris: Secondary | ICD-10-CM | POA: Diagnosis not present

## 2021-09-13 DIAGNOSIS — Z7901 Long term (current) use of anticoagulants: Secondary | ICD-10-CM | POA: Insufficient documentation

## 2021-09-13 DIAGNOSIS — I255 Ischemic cardiomyopathy: Secondary | ICD-10-CM | POA: Insufficient documentation

## 2021-09-13 HISTORY — DX: Malignant (primary) neoplasm, unspecified: C80.1

## 2021-09-13 HISTORY — DX: Cardiac arrhythmia, unspecified: I49.9

## 2021-09-13 LAB — COMPREHENSIVE METABOLIC PANEL
ALT: 11 U/L (ref 0–44)
AST: 23 U/L (ref 15–41)
Albumin: 3.7 g/dL (ref 3.5–5.0)
Alkaline Phosphatase: 89 U/L (ref 38–126)
Anion gap: 8 (ref 5–15)
BUN: 19 mg/dL (ref 8–23)
CO2: 23 mmol/L (ref 22–32)
Calcium: 9 mg/dL (ref 8.9–10.3)
Chloride: 107 mmol/L (ref 98–111)
Creatinine, Ser: 0.9 mg/dL (ref 0.61–1.24)
GFR, Estimated: >60 mL/min (ref >60)
Glucose, Bld: 101 mg/dL — ABNORMAL HIGH (ref 70–99)
Potassium: 4.5 mmol/L (ref 3.5–5.1)
Sodium: 138 mmol/L (ref 135–145)
Total Bilirubin: 0.7 mg/dL (ref 0.3–1.2)
Total Protein: 7 g/dL (ref 6.5–8.1)

## 2021-09-13 LAB — SURGICAL PCR SCREEN
MRSA, PCR: NEGATIVE
Staphylococcus aureus: NEGATIVE

## 2021-09-13 LAB — CBC
HCT: 38.8 % — ABNORMAL LOW (ref 39.0–52.0)
Hemoglobin: 12.4 g/dL — ABNORMAL LOW (ref 13.0–17.0)
MCH: 28.3 pg (ref 26.0–34.0)
MCHC: 32 g/dL (ref 30.0–36.0)
MCV: 88.6 fL (ref 80.0–100.0)
Platelets: 202 10*3/uL (ref 150–400)
RBC: 4.38 MIL/uL (ref 4.22–5.81)
RDW: 14.6 % (ref 11.5–15.5)
WBC: 5.3 10*3/uL (ref 4.0–10.5)
nRBC: 0 % (ref 0.0–0.2)

## 2021-09-13 LAB — PROTIME-INR
INR: 2.1 — ABNORMAL HIGH (ref 0.8–1.2)
Prothrombin Time: 23.2 seconds — ABNORMAL HIGH (ref 11.4–15.2)

## 2021-09-15 NOTE — Progress Notes (Signed)
Anesthesia Chart Review   Case: [id number] Date/Time: 09/22/21 1341   Procedure: RIGHT KNEE REMOVAL SPACER TOTAL KNEE REVISION (Right: Knee) - 210   Anesthesia type: Spinal   Pre-op diagnosis: S/P resection total knee arthroplasty, antibiotic pacer placement   Location: [facility] 08 / [facility]   Surgeons: Rod Can, MD       DISCUSSION:70 y.o. never smoker with h/o HTN, OSA, DVT/PE on Xarelto, CAD requiring emergency CABG following right coronary dissection with extension into the ascending aorta, emergency surgery (bypass grafting and replacement of the ascending aorta, and ischemic CM with CHF ef 35%, s/p resection of right total knee arthroplasty and antibiotic spacer placed scheduled for above procedure 09/22/2021 with Dr. Rod Can.   Pt last seen by cardiology 06/28/2021. Stable at this visit. Per Dr. Mallie Mussel, "I do believe he can proceed with surgery"  Pt advised to hold Xarelto 3 days prior to surgery.   Anticipate pt can proceed with planned procedure barring acute status change.   VS: BP 134/82   Pulse 87   Temp 36.5 C (Oral)   Resp 14   Ht 5\' 10"  (1.778 m)   Wt 101.1 kg   SpO2 99%   BMI 31.98 kg/m   PROVIDERS: Mayra Neer, MD is PCP   Daneen Schick, MD is Cardiologist LABS:  recheck PT/INR DOS (all labs ordered are listed, but only abnormal results are displayed)  Labs Reviewed  CBC - Abnormal; Notable for the following components:      Result Value   Hemoglobin 12.4 (*)    HCT 38.8 (*)    All other components within normal limits  COMPREHENSIVE METABOLIC PANEL - Abnormal; Notable for the following components:   Glucose, Bld 101 (*)    All other components within normal limits  PROTIME-INR - Abnormal; Notable for the following components:   Prothrombin Time 23.2 (*)    INR 2.1 (*)    All other components within normal limits  SURGICAL PCR SCREEN  TYPE AND SCREEN     IMAGES:   EKG: 01/22/2021 Rate 90 bpm  Sinus rhythm  Multiple premature  complexes Prolonged PR interval Nonspecific IVCD with LAD Anterior infarct, old  CV: Echo 01/22/2021  1. Septal and inferior wall hypokinesis . Left ventricular ejection  fraction, by estimation, is 30 to 35%. The left ventricle has moderately  decreased function. The left ventricle demonstrates regional wall motion  abnormalities (see scoring  diagram/findings for description). The left ventricular internal cavity  size was mildly dilated. Left ventricular diastolic parameters were  normal.   2. Right ventricular systolic function is normal. The right ventricular  size is normal. There is normal pulmonary artery systolic pressure.   3. Left atrial size was moderately dilated.   4. The mitral valve is abnormal. Mild mitral valve regurgitation. No  evidence of mitral stenosis.   5. The aortic valve is tricuspid. There is moderate calcification of the  aortic valve. Aortic valve regurgitation is mild. Mild aortic valve  sclerosis is present, with no evidence of aortic valve stenosis.   6. The inferior vena cava is normal in size with greater than 50%  respiratory variability, suggesting right atrial pressure of 3 mmHg. Past Medical History:  Diagnosis Date   Cancer ([place])    skin   DJD (degenerative joint disease), lumbar    DVT (deep venous thrombosis) (HCC)    Dysrhythmia    Family history of adverse reaction to anesthesia    Glaucoma    R  eye diminished vision approx 50%   High cholesterol    Hyperlipidemia    Hypertension    IFG (impaired fasting glucose)    OSA (obstructive sleep apnea)    Severe w AHI 34/hr on HST no on BiPAP at 19/15cm H2O   PE (pulmonary embolism)    Second occurance,enknown etiology,lifelong anticoagulation    Past Surgical History:  Procedure Laterality Date   CARDIAC CATHETERIZATION N/A 05/14/2016   Procedure: Left Heart Cath and Coronary Angiography;  Surgeon: Belva Crome, MD;  Location: [facility];  Service: Cardiovascular;  Laterality:  N/A;   CORONARY ARTERY BYPASS GRAFT N/A 05/14/2016   Procedure: CORONARY ARTERY BYPASS GRAFTING (CABG) times two with LIMA to LAD and right leg SVG to PDA,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;  Surgeon: Ivin Poot, MD;  Location: [place];  Service: Open Heart Surgery;  Laterality: N/A;   EXCISIONAL TOTAL KNEE ARTHROPLASTY WITH ANTIBIOTIC SPACERS Right 05/26/2021   Procedure: EXCISIONAL TOTAL KNEE ARTHROPLASTY WITH ANTIBIOTIC SPACERS;  Surgeon: Rod Can, MD;  Location: [facility];  Service: Orthopedics;  Laterality: Right;   KNEE SURGERY Bilateral    arthroscopic   TOTAL KNEE ARTHROPLASTY Bilateral 1996    MEDICATIONS:  acetaminophen (TYLENOL) 325 MG tablet   allopurinol (ZYLOPRIM) 300 MG tablet   carvedilol (COREG) 3.125 MG tablet   dapagliflozin propanediol (FARXIGA) 10 MG TABS tablet   diclofenac Sodium (VOLTAREN) 1 % GEL   gabapentin (NEURONTIN) 100 MG capsule   HYDROcodone-acetaminophen (NORCO) 7.5-325 MG tablet   losartan (COZAAR) 25 MG tablet   rivaroxaban (XARELTO) 20 MG TABS tablet   sertraline (ZOLOFT) 100 MG tablet   simvastatin (ZOCOR) 40 MG tablet   spironolactone (ALDACTONE) 25 MG tablet   traMADol (ULTRAM) 50 MG tablet   No current facility-administered medications for this encounter.    Konrad Felix Ward, PA-C [facility] [phone]

## 2021-09-19 NOTE — Anesthesia Preprocedure Evaluation (Addendum)
Anesthesia Evaluation  Patient identified by MRN, date of birth, ID band Patient awake    Reviewed: Allergy & Precautions, H&P , NPO status , Patient's Chart, lab work & pertinent test results  Airway Mallampati: II  TM Distance: >3 FB Neck ROM: Full    Dental no notable dental hx.    Pulmonary sleep apnea and Continuous Positive Airway Pressure Ventilation , PE   Pulmonary exam normal breath sounds clear to auscultation       Cardiovascular hypertension, + CAD, +CHF and + DVT  Normal cardiovascular exam Rhythm:Regular Rate:Normal  1. Septal and inferior wall hypokinesis . Left ventricular ejection  fraction, by estimation, is 30 to 35%. The left ventricle has moderately  decreased function. The left ventricle demonstrates regional wall motion  abnormalities (see scoring  diagram/findings for description). The left ventricular internal cavity  size was mildly dilated. Left ventricular diastolic parameters were  normal.  2. Right ventricular systolic function is normal. The right ventricular  size is normal. There is normal pulmonary artery systolic pressure.  3. Left atrial size was moderately dilated.  4. The mitral valve is abnormal. Mild mitral valve regurgitation. No  evidence of mitral stenosis.  5. The aortic valve is tricuspid. There is moderate calcification of the  aortic valve. Aortic valve regurgitation is mild. Mild aortic valve  sclerosis is present, with no evidence of aortic valve stenosis.  6. The inferior vena cava is normal in size with greater than 50%  respiratory variability, suggesting right atrial pressure of 3 mmHg   Neuro/Psych TIAnegative psych ROS   GI/Hepatic negative GI ROS, Neg liver ROS,   Endo/Other  negative endocrine ROS  Renal/GU negative Renal ROS  negative genitourinary   Musculoskeletal  (+) Arthritis , Osteoarthritis,    Abdominal   Peds negative pediatric ROS (+)   Hematology negative hematology ROS (+)   Anesthesia Other Findings   Reproductive/Obstetrics negative OB ROS                           Anesthesia Physical Anesthesia Plan  ASA: 3  Anesthesia Plan: Spinal   Post-op Pain Management: Regional block   Induction: Intravenous  PONV Risk Score and Plan: 2 and Propofol infusion, Ondansetron and Treatment may vary due to age or medical condition  Airway Management Planned: Simple Face Mask  Additional Equipment:   Intra-op Plan:   Post-operative Plan:   Informed Consent: I have reviewed the patients History and Physical, chart, labs and discussed the procedure including the risks, benefits and alternatives for the proposed anesthesia with the patient or authorized representative who has indicated his/her understanding and acceptance.     Dental advisory given  Plan Discussed with: CRNA and Surgeon  Anesthesia Plan Comments: (See PAT note 09/13/2021, Konrad Felix Ward, PA-C)       Anesthesia Quick Evaluation

## 2021-09-20 ENCOUNTER — Other Ambulatory Visit: Payer: Self-pay | Admitting: Orthopedic Surgery

## 2021-09-21 LAB — SARS CORONAVIRUS 2 (TAT 6-24 HRS): SARS Coronavirus 2: NEGATIVE

## 2021-09-22 ENCOUNTER — Inpatient Hospital Stay (HOSPITAL_COMMUNITY): Payer: Medicare Other | Admitting: Anesthesiology

## 2021-09-22 ENCOUNTER — Encounter (HOSPITAL_COMMUNITY)
Admission: RE | Disposition: A | Discharge status: Home Health | Payer: Self-pay | Source: Home / Self Care | Attending: Orthopedic Surgery

## 2021-09-22 ENCOUNTER — Inpatient Hospital Stay (HOSPITAL_COMMUNITY): Payer: Medicare Other | Admitting: Physician Assistant

## 2021-09-22 ENCOUNTER — Inpatient Hospital Stay (HOSPITAL_COMMUNITY)
Admission: RE | Admit: 2021-09-22 | Discharge: 2021-09-25 | DRG: 468 | Disposition: A | Discharge status: Home Health | Payer: Medicare Other | Attending: Orthopedic Surgery | Admitting: Orthopedic Surgery

## 2021-09-22 ENCOUNTER — Inpatient Hospital Stay (HOSPITAL_COMMUNITY): Payer: Medicare Other

## 2021-09-22 ENCOUNTER — Encounter (HOSPITAL_COMMUNITY): Payer: Self-pay | Admitting: Orthopedic Surgery

## 2021-09-22 DIAGNOSIS — G8918 Other acute postprocedural pain: Secondary | ICD-10-CM | POA: Diagnosis not present

## 2021-09-22 DIAGNOSIS — H409 Unspecified glaucoma: Secondary | ICD-10-CM | POA: Diagnosis present

## 2021-09-22 DIAGNOSIS — Z96659 Presence of unspecified artificial knee joint: Secondary | ICD-10-CM

## 2021-09-22 DIAGNOSIS — Y792 Prosthetic and other implants, materials and accessory orthopedic devices associated with adverse incidents: Secondary | ICD-10-CM | POA: Diagnosis present

## 2021-09-22 DIAGNOSIS — I2510 Atherosclerotic heart disease of native coronary artery without angina pectoris: Secondary | ICD-10-CM | POA: Diagnosis not present

## 2021-09-22 DIAGNOSIS — T8459XA Infection and inflammatory reaction due to other internal joint prosthesis, initial encounter: Secondary | ICD-10-CM

## 2021-09-22 DIAGNOSIS — Z951 Presence of aortocoronary bypass graft: Secondary | ICD-10-CM | POA: Diagnosis not present

## 2021-09-22 DIAGNOSIS — Z9889 Other specified postprocedural states: Secondary | ICD-10-CM | POA: Diagnosis not present

## 2021-09-22 DIAGNOSIS — Z86718 Personal history of other venous thrombosis and embolism: Secondary | ICD-10-CM | POA: Diagnosis not present

## 2021-09-22 DIAGNOSIS — Z471 Aftercare following joint replacement surgery: Secondary | ICD-10-CM | POA: Diagnosis not present

## 2021-09-22 DIAGNOSIS — T84498A Other mechanical complication of other internal orthopedic devices, implants and grafts, initial encounter: Secondary | ICD-10-CM | POA: Diagnosis present

## 2021-09-22 DIAGNOSIS — Z20822 Contact with and (suspected) exposure to covid-19: Secondary | ICD-10-CM | POA: Diagnosis present

## 2021-09-22 DIAGNOSIS — I252 Old myocardial infarction: Secondary | ICD-10-CM | POA: Diagnosis not present

## 2021-09-22 DIAGNOSIS — I10 Essential (primary) hypertension: Secondary | ICD-10-CM | POA: Diagnosis present

## 2021-09-22 DIAGNOSIS — E7800 Pure hypercholesterolemia, unspecified: Secondary | ICD-10-CM | POA: Diagnosis not present

## 2021-09-22 DIAGNOSIS — T8453XA Infection and inflammatory reaction due to internal right knee prosthesis, initial encounter: Secondary | ICD-10-CM | POA: Diagnosis not present

## 2021-09-22 DIAGNOSIS — Z96651 Presence of right artificial knee joint: Secondary | ICD-10-CM | POA: Diagnosis not present

## 2021-09-22 DIAGNOSIS — G4733 Obstructive sleep apnea (adult) (pediatric): Secondary | ICD-10-CM | POA: Diagnosis present

## 2021-09-22 DIAGNOSIS — Z8249 Family history of ischemic heart disease and other diseases of the circulatory system: Secondary | ICD-10-CM | POA: Diagnosis not present

## 2021-09-22 DIAGNOSIS — Z4733 Aftercare following explantation of knee joint prosthesis: Secondary | ICD-10-CM | POA: Diagnosis not present

## 2021-09-22 DIAGNOSIS — Z7901 Long term (current) use of anticoagulants: Secondary | ICD-10-CM

## 2021-09-22 DIAGNOSIS — Z86711 Personal history of pulmonary embolism: Secondary | ICD-10-CM | POA: Diagnosis not present

## 2021-09-22 DIAGNOSIS — I110 Hypertensive heart disease with heart failure: Secondary | ICD-10-CM | POA: Diagnosis not present

## 2021-09-22 DIAGNOSIS — T8489XA Other specified complication of internal orthopedic prosthetic devices, implants and grafts, initial encounter: Secondary | ICD-10-CM | POA: Diagnosis not present

## 2021-09-22 DIAGNOSIS — Z79899 Other long term (current) drug therapy: Secondary | ICD-10-CM

## 2021-09-22 DIAGNOSIS — I509 Heart failure, unspecified: Secondary | ICD-10-CM | POA: Diagnosis not present

## 2021-09-22 DIAGNOSIS — T8453XS Infection and inflammatory reaction due to internal right knee prosthesis, sequela: Secondary | ICD-10-CM | POA: Diagnosis not present

## 2021-09-22 HISTORY — PX: TOTAL KNEE REVISION: SHX996

## 2021-09-22 LAB — GLUCOSE, CAPILLARY
Glucose-Capillary: 111 mg/dL — ABNORMAL HIGH (ref 70–99)
Glucose-Capillary: 110 mg/dL — ABNORMAL HIGH (ref 70–99)

## 2021-09-22 LAB — TYPE AND SCREEN
ABO/RH(D): A POS
Antibody Screen: NEGATIVE

## 2021-09-22 LAB — ABO/RH: ABO/RH(D): A POS

## 2021-09-22 SURGERY — TOTAL KNEE REVISION
Anesthesia: Spinal | Site: Knee | Laterality: Right

## 2021-09-22 MED ORDER — DEXAMETHASONE SODIUM PHOSPHATE 10 MG/ML IJ SOLN
10.0000 mg | Freq: Once | INTRAMUSCULAR | Status: AC
  Administered 2021-09-23: 10 mg via INTRAVENOUS
  Filled 2021-09-22: qty 1

## 2021-09-22 MED ORDER — CELECOXIB 200 MG PO CAPS
200.0000 mg | ORAL_CAPSULE | Freq: Two times a day (BID) | ORAL | Status: DC
  Administered 2021-09-22 – 2021-09-25 (×6): 200 mg via ORAL
  Filled 2021-09-22 (×6): qty 1

## 2021-09-22 MED ORDER — ONDANSETRON HCL 4 MG PO TABS: 4.0000 mg | ORAL_TABLET | Freq: Four times a day (QID) | ORAL | Status: DC | PRN

## 2021-09-22 MED ORDER — CEFAZOLIN SODIUM-DEXTROSE 2-4 GM/100ML-% IV SOLN
2.0000 g | INTRAVENOUS | Status: AC
  Administered 2021-09-22 (×2): 2 g via INTRAVENOUS
  Filled 2021-09-22: qty 100

## 2021-09-22 MED ORDER — FENTANYL CITRATE (PF) 100 MCG/2ML IJ SOLN
INTRAMUSCULAR | Status: AC
  Filled 2021-09-22: qty 2

## 2021-09-22 MED ORDER — METOCLOPRAMIDE HCL 5 MG PO TABS: 5.0000 mg | ORAL_TABLET | Freq: Three times a day (TID) | ORAL | Status: DC | PRN

## 2021-09-22 MED ORDER — STERILE WATER FOR IRRIGATION IR SOLN
Status: DC | PRN
  Administered 2021-09-22: 2000 mL

## 2021-09-22 MED ORDER — TRANEXAMIC ACID-NACL 1000-0.7 MG/100ML-% IV SOLN
1000.0000 mg | INTRAVENOUS | Status: AC
  Administered 2021-09-22: 1000 mg via INTRAVENOUS
  Filled 2021-09-22: qty 100

## 2021-09-22 MED ORDER — PHENYLEPHRINE HCL-NACL 20-0.9 MG/250ML-% IV SOLN
INTRAVENOUS | Status: DC | PRN
  Administered 2021-09-22: 30 ug/min via INTRAVENOUS

## 2021-09-22 MED ORDER — SENNA 8.6 MG PO TABS
1.0000 | ORAL_TABLET | Freq: Two times a day (BID) | ORAL | Status: DC
  Administered 2021-09-22 – 2021-09-25 (×6): 8.6 mg via ORAL
  Filled 2021-09-22 (×6): qty 1

## 2021-09-22 MED ORDER — POVIDONE-IODINE 10 % EX SWAB
2.0000 "application " | Freq: Once | CUTANEOUS | Status: AC
  Administered 2021-09-22: 2 via TOPICAL

## 2021-09-22 MED ORDER — ONDANSETRON HCL 4 MG/2ML IJ SOLN
INTRAMUSCULAR | Status: DC | PRN
  Administered 2021-09-22: 4 mg via INTRAVENOUS

## 2021-09-22 MED ORDER — FENTANYL CITRATE (PF) 100 MCG/2ML IJ SOLN
INTRAMUSCULAR | Status: DC | PRN
  Administered 2021-09-22 (×4): 25 ug via INTRAVENOUS

## 2021-09-22 MED ORDER — SIMVASTATIN 40 MG PO TABS
40.0000 mg | ORAL_TABLET | Freq: Every day | ORAL | Status: DC
  Administered 2021-09-23 – 2021-09-24 (×2): 40 mg via ORAL
  Filled 2021-09-22 (×2): qty 1

## 2021-09-22 MED ORDER — PHENOL 1.4 % MT LIQD: 1.0000 | OROMUCOSAL | Status: DC | PRN

## 2021-09-22 MED ORDER — SODIUM CHLORIDE (PF) 0.9 % IJ SOLN
INTRAMUSCULAR | Status: DC | PRN
  Administered 2021-09-22: 30 mL

## 2021-09-22 MED ORDER — ONDANSETRON HCL 4 MG/2ML IJ SOLN: 4.0000 mg | Freq: Four times a day (QID) | INTRAMUSCULAR | Status: DC | PRN

## 2021-09-22 MED ORDER — PROPOFOL 10 MG/ML IV BOLUS
INTRAVENOUS | Status: DC | PRN
  Administered 2021-09-22: 10 mg via INTRAVENOUS

## 2021-09-22 MED ORDER — DIPHENHYDRAMINE HCL 12.5 MG/5ML PO ELIX: 12.5000 mg | ORAL_SOLUTION | ORAL | Status: DC | PRN

## 2021-09-22 MED ORDER — SODIUM CHLORIDE 0.9 % IV SOLN: INTRAVENOUS | Status: DC

## 2021-09-22 MED ORDER — OXYCODONE HCL 5 MG/5ML PO SOLN: 5.0000 mg | Freq: Once | ORAL | Status: DC | PRN

## 2021-09-22 MED ORDER — METOCLOPRAMIDE HCL 5 MG/ML IJ SOLN: 5.0000 mg | Freq: Three times a day (TID) | INTRAMUSCULAR | Status: DC | PRN

## 2021-09-22 MED ORDER — ALLOPURINOL 300 MG PO TABS
300.0000 mg | ORAL_TABLET | Freq: Every day | ORAL | Status: DC
  Administered 2021-09-22 – 2021-09-25 (×4): 300 mg via ORAL
  Filled 2021-09-22 (×4): qty 1

## 2021-09-22 MED ORDER — SERTRALINE HCL 50 MG PO TABS
150.0000 mg | ORAL_TABLET | Freq: Every day | ORAL | Status: DC
  Administered 2021-09-23 – 2021-09-25 (×3): 150 mg via ORAL
  Filled 2021-09-22 (×3): qty 1

## 2021-09-22 MED ORDER — OXYCODONE HCL 5 MG PO TABS: 5.0000 mg | ORAL_TABLET | Freq: Once | ORAL | Status: DC | PRN

## 2021-09-22 MED ORDER — HYDROMORPHONE HCL 1 MG/ML IJ SOLN
0.2500 mg | INTRAMUSCULAR | Status: DC | PRN
  Administered 2021-09-22: 0.5 mg via INTRAVENOUS

## 2021-09-22 MED ORDER — INSULIN ASPART 100 UNIT/ML IJ SOLN
0.0000 [IU] | Freq: Three times a day (TID) | INTRAMUSCULAR | Status: DC
Start: 2021-09-23 — End: 2021-09-25
  Administered 2021-09-23: 1 [IU] via SUBCUTANEOUS
  Administered 2021-09-23 (×2): 2 [IU] via SUBCUTANEOUS

## 2021-09-22 MED ORDER — MIDAZOLAM HCL 2 MG/2ML IJ SOLN
1.0000 mg | Freq: Once | INTRAMUSCULAR | Status: AC
  Administered 2021-09-22: 1 mg via INTRAVENOUS
  Filled 2021-09-22: qty 2

## 2021-09-22 MED ORDER — ONDANSETRON HCL 4 MG/2ML IJ SOLN: 4.0000 mg | Freq: Once | INTRAMUSCULAR | Status: DC | PRN

## 2021-09-22 MED ORDER — PROPOFOL 500 MG/50ML IV EMUL
INTRAVENOUS | Status: DC | PRN
  Administered 2021-09-22: 30 ug/kg/min via INTRAVENOUS

## 2021-09-22 MED ORDER — KETOROLAC TROMETHAMINE 30 MG/ML IJ SOLN
INTRAMUSCULAR | Status: AC
  Filled 2021-09-22: qty 1

## 2021-09-22 MED ORDER — LACTATED RINGERS IV SOLN: INTRAVENOUS | Status: DC

## 2021-09-22 MED ORDER — CHLORHEXIDINE GLUCONATE 0.12 % MT SOLN
15.0000 mL | Freq: Once | OROMUCOSAL | Status: AC
  Administered 2021-09-22: 15 mL via OROMUCOSAL

## 2021-09-22 MED ORDER — BUPIVACAINE IN DEXTROSE 0.75-8.25 % IT SOLN
INTRATHECAL | Status: DC | PRN
  Administered 2021-09-22: 2 mL via INTRATHECAL

## 2021-09-22 MED ORDER — ACETAMINOPHEN 325 MG PO TABS: 325.0000 mg | ORAL_TABLET | Freq: Four times a day (QID) | ORAL | Status: DC | PRN

## 2021-09-22 MED ORDER — ACETAMINOPHEN 10 MG/ML IV SOLN
1000.0000 mg | Freq: Once | INTRAVENOUS | Status: AC
  Administered 2021-09-22: 1000 mg via INTRAVENOUS
  Filled 2021-09-22: qty 100

## 2021-09-22 MED ORDER — POLYETHYLENE GLYCOL 3350 17 G PO PACK: 17.0000 g | PACK | Freq: Every day | ORAL | Status: DC | PRN

## 2021-09-22 MED ORDER — CARVEDILOL 3.125 MG PO TABS
3.1250 mg | ORAL_TABLET | Freq: Two times a day (BID) | ORAL | Status: DC
  Administered 2021-09-23 – 2021-09-25 (×5): 3.125 mg via ORAL
  Filled 2021-09-22 (×5): qty 1

## 2021-09-22 MED ORDER — BUPIVACAINE-EPINEPHRINE (PF) 0.25% -1:200000 IJ SOLN
INTRAMUSCULAR | Status: AC
  Filled 2021-09-22: qty 30

## 2021-09-22 MED ORDER — INSULIN ASPART 100 UNIT/ML IJ SOLN: 0.0000 [IU] | Freq: Every day | INTRAMUSCULAR | Status: DC

## 2021-09-22 MED ORDER — LOSARTAN POTASSIUM 25 MG PO TABS
25.0000 mg | ORAL_TABLET | Freq: Every day | ORAL | Status: DC
  Administered 2021-09-23 – 2021-09-25 (×3): 25 mg via ORAL
  Filled 2021-09-22 (×3): qty 1

## 2021-09-22 MED ORDER — FENTANYL CITRATE PF 50 MCG/ML IJ SOSY
50.0000 ug | PREFILLED_SYRINGE | Freq: Once | INTRAMUSCULAR | Status: AC
  Administered 2021-09-22: 50 ug via INTRAVENOUS
  Filled 2021-09-22: qty 2

## 2021-09-22 MED ORDER — SODIUM CHLORIDE (PF) 0.9 % IJ SOLN
INTRAMUSCULAR | Status: AC
  Filled 2021-09-22: qty 30

## 2021-09-22 MED ORDER — ISOPROPYL ALCOHOL 70 % SOLN
Status: DC | PRN
  Administered 2021-09-22: 1 via TOPICAL

## 2021-09-22 MED ORDER — MENTHOL 3 MG MT LOZG: 1.0000 | LOZENGE | OROMUCOSAL | Status: DC | PRN

## 2021-09-22 MED ORDER — SODIUM CHLORIDE 0.9 % IR SOLN
Status: DC | PRN
  Administered 2021-09-22 (×2): 1000 mL

## 2021-09-22 MED ORDER — METHOCARBAMOL 1000 MG/10ML IJ SOLN
500.0000 mg | Freq: Four times a day (QID) | INTRAVENOUS | Status: DC | PRN
  Filled 2021-09-22: qty 5

## 2021-09-22 MED ORDER — METHOCARBAMOL 500 MG PO TABS
500.0000 mg | ORAL_TABLET | Freq: Four times a day (QID) | ORAL | Status: DC | PRN
  Administered 2021-09-22 – 2021-09-23 (×2): 500 mg via ORAL
  Filled 2021-09-22 (×2): qty 1

## 2021-09-22 MED ORDER — ACETAMINOPHEN 10 MG/ML IV SOLN: 1000.0000 mg | Freq: Once | INTRAVENOUS | Status: DC | PRN

## 2021-09-22 MED ORDER — GABAPENTIN 100 MG PO CAPS
100.0000 mg | ORAL_CAPSULE | Freq: Every day | ORAL | Status: DC
  Administered 2021-09-23 – 2021-09-25 (×3): 100 mg via ORAL
  Filled 2021-09-22 (×3): qty 1

## 2021-09-22 MED ORDER — ORAL CARE MOUTH RINSE: 15.0000 mL | Freq: Once | OROMUCOSAL | Status: AC

## 2021-09-22 MED ORDER — HYDROMORPHONE HCL 1 MG/ML IJ SOLN
0.5000 mg | INTRAMUSCULAR | Status: DC | PRN
  Administered 2021-09-23: 1 mg via INTRAVENOUS
  Filled 2021-09-22: qty 1

## 2021-09-22 MED ORDER — PHENYLEPHRINE HCL-NACL 20-0.9 MG/250ML-% IV SOLN
INTRAVENOUS | Status: AC
  Filled 2021-09-22: qty 250

## 2021-09-22 MED ORDER — 0.9 % SODIUM CHLORIDE (POUR BTL) OPTIME
TOPICAL | Status: DC | PRN
  Administered 2021-09-22: 1000 mL

## 2021-09-22 MED ORDER — BUPIVACAINE-EPINEPHRINE (PF) 0.25% -1:200000 IJ SOLN
INTRAMUSCULAR | Status: DC | PRN
  Administered 2021-09-22: 30 mL

## 2021-09-22 MED ORDER — SODIUM CHLORIDE 0.9 % IV SOLN
2.0000 g | INTRAVENOUS | Status: AC
  Administered 2021-09-22 – 2021-09-24 (×3): 2 g via INTRAVENOUS
  Filled 2021-09-22 (×3): qty 20

## 2021-09-22 MED ORDER — OXYCODONE HCL 5 MG PO TABS
10.0000 mg | ORAL_TABLET | ORAL | Status: DC | PRN
  Filled 2021-09-22: qty 2

## 2021-09-22 MED ORDER — BUPIVACAINE HCL (PF) 0.5 % IJ SOLN
INTRAMUSCULAR | Status: DC | PRN
  Administered 2021-09-22: 20 mL via PERINEURAL

## 2021-09-22 MED ORDER — GLYCOPYRROLATE 0.2 MG/ML IJ SOLN
INTRAMUSCULAR | Status: DC | PRN
  Administered 2021-09-22: .1 mg via INTRAVENOUS

## 2021-09-22 MED ORDER — HYDROMORPHONE HCL 1 MG/ML IJ SOLN
INTRAMUSCULAR | Status: AC
  Filled 2021-09-22: qty 1

## 2021-09-22 MED ORDER — DOCUSATE SODIUM 100 MG PO CAPS
100.0000 mg | ORAL_CAPSULE | Freq: Two times a day (BID) | ORAL | Status: DC
  Administered 2021-09-22 – 2021-09-25 (×6): 100 mg via ORAL
  Filled 2021-09-22 (×6): qty 1

## 2021-09-22 MED ORDER — CEFAZOLIN SODIUM-DEXTROSE 2-4 GM/100ML-% IV SOLN
INTRAVENOUS | Status: AC
  Filled 2021-09-22: qty 100

## 2021-09-22 MED ORDER — OXYCODONE HCL 5 MG PO TABS
5.0000 mg | ORAL_TABLET | ORAL | Status: DC | PRN
  Administered 2021-09-22: 5 mg via ORAL
  Administered 2021-09-23 (×2): 10 mg via ORAL
  Filled 2021-09-22 (×2): qty 2
  Filled 2021-09-22: qty 1
  Filled 2021-09-22: qty 2

## 2021-09-22 MED ORDER — PROPOFOL 1000 MG/100ML IV EMUL
INTRAVENOUS | Status: AC
  Filled 2021-09-22: qty 200

## 2021-09-22 MED ORDER — TRANEXAMIC ACID-NACL 1000-0.7 MG/100ML-% IV SOLN
1000.0000 mg | Freq: Once | INTRAVENOUS | Status: AC
  Administered 2021-09-22: 1000 mg via INTRAVENOUS
  Filled 2021-09-22: qty 100

## 2021-09-22 MED ORDER — KETOROLAC TROMETHAMINE 30 MG/ML IJ SOLN
INTRAMUSCULAR | Status: DC | PRN
  Administered 2021-09-22: 30 mg

## 2021-09-22 MED ORDER — PROPOFOL 500 MG/50ML IV EMUL
INTRAVENOUS | Status: AC
  Filled 2021-09-22: qty 50

## 2021-09-22 MED ORDER — VANCOMYCIN HCL IN DEXTROSE 1-5 GM/200ML-% IV SOLN
1000.0000 mg | Freq: Two times a day (BID) | INTRAVENOUS | Status: AC
  Administered 2021-09-23: 1000 mg via INTRAVENOUS
  Filled 2021-09-22: qty 200

## 2021-09-22 MED ORDER — RIVAROXABAN 10 MG PO TABS
10.0000 mg | ORAL_TABLET | Freq: Every day | ORAL | Status: DC
  Administered 2021-09-23 – 2021-09-25 (×3): 10 mg via ORAL
  Filled 2021-09-22 (×3): qty 1

## 2021-09-22 MED ORDER — DAPAGLIFLOZIN PROPANEDIOL 10 MG PO TABS
10.0000 mg | ORAL_TABLET | Freq: Every day | ORAL | Status: DC
  Administered 2021-09-23 – 2021-09-25 (×3): 10 mg via ORAL
  Filled 2021-09-22 (×3): qty 1

## 2021-09-22 MED ORDER — POVIDONE-IODINE 10 % EX SWAB: 2.0000 "application " | Freq: Once | CUTANEOUS | Status: DC

## 2021-09-22 MED ORDER — ALUM & MAG HYDROXIDE-SIMETH 200-200-20 MG/5ML PO SUSP: 30.0000 mL | ORAL | Status: DC | PRN

## 2021-09-22 MED ORDER — SODIUM CHLORIDE 0.9 % IV SOLN: INTRAVENOUS | Status: DC | PRN

## 2021-09-22 SURGICAL SUPPLY — 103 items
ADH SKN CLS APL DERMABOND .7 (GAUZE/BANDAGES/DRESSINGS) ×2
ANCH SUT 2 2.9 2 LD TPR NDL (Anchor) ×1 IMPLANT
ANCHOR JUGGERKNOT WTAP NDL 2.9 (Anchor) ×1 IMPLANT
APL PRP STRL LF DISP 70% ISPRP (MISCELLANEOUS) ×2
AUG FEM 11 11+ 10 STRL KN DIST (Joint) ×1 IMPLANT
AUG FEM 11 11+ 15 STRL KN DIST (Joint) ×1 IMPLANT
AUG FEM DIST PS 11/11+ 15 (Joint) ×2 IMPLANT
AUG FEM LRG CONE CNTR STRL LF (Joint) ×1 IMPLANT
AUG HALF BLOCK TIB EF RM 15 (Joint) ×2 IMPLANT
AUG TIB EF 15 HLF BLCK STRL RL (Joint) ×1 IMPLANT
AUG TIB EF 15 HLF BLCK STRL RM (Joint) ×1 IMPLANT
AUG TIB LRG CONE CNTR STRL LF (Joint) ×1 IMPLANT
AUGMENT FEM DIST PS 11/11+ 15 (Joint) IMPLANT
AUGMENT HALF BLOCK TIB EF RM15 (Joint) IMPLANT
BAG COUNTER SPONGE SURGICOUNT (BAG) ×2 IMPLANT
BAG SPEC THK2 15X12 ZIP CLS (MISCELLANEOUS) ×1
BAG SPNG CNTER NS LX DISP (BAG) ×1
BAG ZIPLOCK 12X15 (MISCELLANEOUS) ×2 IMPLANT
BIT DRILL JUGRKNT W/NDL BIT2.9 (DRILL) IMPLANT
BLADE SAW RECIPROCATING 77.5 (BLADE) IMPLANT
BLADE SAW SGTL 81X20 HD (BLADE) ×2 IMPLANT
BLOCK HALF TIB PS EF RL 15 (Joint) ×1 IMPLANT
BNDG ELASTIC 4X5.8 VLCR STR LF (GAUZE/BANDAGES/DRESSINGS) ×2 IMPLANT
BNDG ELASTIC 6X5.8 VLCR STR LF (GAUZE/BANDAGES/DRESSINGS) ×2 IMPLANT
BOWL SMART MIX CTS (DISPOSABLE) ×2 IMPLANT
BUR OVAL CARBIDE 4.0 (BURR) ×2 IMPLANT
CEMENT BONE REFOBACIN R1X40 US (Cement) ×5 IMPLANT
CHLORAPREP W/TINT 26 (MISCELLANEOUS) ×4 IMPLANT
COMP FEM CMT PS PLUS 11 RT (Joint) ×2 IMPLANT
COMP TIB UNCOAT KNEE PS F (Joint) ×2 IMPLANT
COMPONENT FEM CMT PS PLUS 11RT (Joint) IMPLANT
COMPONET TIB UNCOAT KNEE PS F (Joint) IMPLANT
COVER SURGICAL LIGHT HANDLE (MISCELLANEOUS) ×2 IMPLANT
CUFF TOURN SGL QUICK 34 (TOURNIQUET CUFF) ×2
CUFF TRNQT CYL 34X4.125X (TOURNIQUET CUFF) ×1 IMPLANT
DERMABOND ADVANCED (GAUZE/BANDAGES/DRESSINGS) ×2
DERMABOND ADVANCED .7 DNX12 (GAUZE/BANDAGES/DRESSINGS) ×1 IMPLANT
DRAPE INCISE IOBAN 66X45 STRL (DRAPES) ×6 IMPLANT
DRAPE SHEET LG 3/4 BI-LAMINATE (DRAPES) ×6 IMPLANT
DRAPE U-SHAPE 47X51 STRL (DRAPES) ×2 IMPLANT
DRESSING PREVENA PLUS CUSTOM (GAUZE/BANDAGES/DRESSINGS) IMPLANT
DRILL JUGGERKNOT W/NDL BIT 2.9 (DRILL) ×2
DRSG AQUACEL AG ADV 3.5X10 (GAUZE/BANDAGES/DRESSINGS) ×2 IMPLANT
DRSG PREVENA PLUS CUSTOM (GAUZE/BANDAGES/DRESSINGS) ×2
ELECT BLADE TIP CTD 4 INCH (ELECTRODE) ×2 IMPLANT
ELECT REM PT RETURN 15FT ADLT (MISCELLANEOUS) ×2 IMPLANT
EVACUATOR DRAINAGE 10X20 100CC (DRAIN) IMPLANT
EVACUATOR SILICONE 100CC (DRAIN)
GAUZE SPONGE 4X4 12PLY STRL (GAUZE/BANDAGES/DRESSINGS) ×2 IMPLANT
GLOVE SRG 8 PF TXTR STRL LF DI (GLOVE) ×2 IMPLANT
GLOVE SURG ENC MOIS LTX SZ7.5 (GLOVE) ×2 IMPLANT
GLOVE SURG ENC MOIS LTX SZ8.5 (GLOVE) ×2 IMPLANT
GLOVE SURG UNDER POLY LF SZ8 (GLOVE) ×4
GLOVE SURG UNDER POLY LF SZ8.5 (GLOVE) ×4 IMPLANT
GOWN SPEC L3 XXLG W/TWL (GOWN DISPOSABLE) ×2 IMPLANT
GOWN SPEC L4 XLG W/TWL (GOWN DISPOSABLE) ×4 IMPLANT
HANDPIECE INTERPULSE COAX TIP (DISPOSABLE) ×2
HDLS TROCR DRIL PIN KNEE 75 (PIN) ×2
HOOD PEEL AWAY FLYTE STAYCOOL (MISCELLANEOUS) ×5 IMPLANT
IMMOBILIZER KNEE 20 (SOFTGOODS)
IMMOBILIZER KNEE 20 THIGH 36 (SOFTGOODS) IMPLANT
INSTR SCRW HEX REV FIX 3.5X48 (ORTHOPEDIC DISPOSABLE SUPPLIES) ×2
INSTRUMENT SCRW HEX REV 3.5X48 (ORTHOPEDIC DISPOSABLE SUPPLIES) IMPLANT
JET LAVAGE IRRISEPT WOUND (IRRIGATION / IRRIGATOR) ×2
KIT TURNOVER KIT A (KITS) IMPLANT
LAVAGE JET IRRISEPT WOUND (IRRIGATION / IRRIGATOR) ×3 IMPLANT
LINER TIB ASF PS EF/11-13 16 R (Liner) ×1 IMPLANT
MANIFOLD NEPTUNE II (INSTRUMENTS) ×2 IMPLANT
MARKER SKIN DUAL TIP RULER LAB (MISCELLANEOUS) ×2 IMPLANT
NDL SPNL 18GX3.5 QUINCKE PK (NEEDLE) ×1 IMPLANT
NEEDLE SPNL 18GX3.5 QUINCKE PK (NEEDLE) ×2 IMPLANT
NS IRRIG 1000ML POUR BTL (IV SOLUTION) ×4 IMPLANT
PACK TOTAL KNEE CUSTOM (KITS) ×2 IMPLANT
PADDING CAST COTTON 6X4 STRL (CAST SUPPLIES) ×2 IMPLANT
PIN DRILL HDLS TROCAR 75 4PK (PIN) IMPLANT
PROTECTOR NERVE ULNAR (MISCELLANEOUS) ×2 IMPLANT
SAW OSC TIP CART 19.5X105X1.3 (SAW) ×2 IMPLANT
SCREW HEX HEADED 3.5X27 DISP (ORTHOPEDIC DISPOSABLE SUPPLIES) ×1 IMPLANT
SEALER BIPOLAR AQUA 6.0 (INSTRUMENTS) ×2 IMPLANT
SET HNDPC FAN SPRY TIP SCT (DISPOSABLE) ×1 IMPLANT
SET PAD KNEE POSITIONER (MISCELLANEOUS) ×2 IMPLANT
STAPLER VISISTAT 35W (STAPLE) IMPLANT
STEM EXT REV PS 17X135 (Stem) ×1 IMPLANT
STEM FEM OFFSET 13X135X6 (Stem) ×1 IMPLANT
SUT MNCRL AB 3-0 PS2 18 (SUTURE) ×2 IMPLANT
SUT MNCRL AB 4-0 PS2 18 (SUTURE) ×2 IMPLANT
SUT MON AB 2-0 CT1 36 (SUTURE) ×2 IMPLANT
SUT STRATAFIX PDO 1 14 VIOLET (SUTURE) ×2
SUT STRATFX PDO 1 14 VIOLET (SUTURE) ×1
SUT VIC AB 1 CTX 36 (SUTURE) ×4
SUT VIC AB 1 CTX36XBRD ANBCTR (SUTURE) ×2 IMPLANT
SUT VIC AB 2-0 CT1 27 (SUTURE) ×2
SUT VIC AB 2-0 CT1 TAPERPNT 27 (SUTURE) ×1 IMPLANT
SUTURE STRATFX PDO 1 14 VIOLET (SUTURE) ×1 IMPLANT
SWAB COLLECTION DEVICE MRSA (MISCELLANEOUS) IMPLANT
SWAB CULTURE ESWAB REG 1ML (MISCELLANEOUS) IMPLANT
SYSTEM VACUUM CEMENT MIXING (MISCELLANEOUS) ×2 IMPLANT
TRAY FOLEY MTR SLVR 16FR STAT (SET/KITS/TRAYS/PACK) ×2 IMPLANT
WATER STERILE IRR 1000ML POUR (IV SOLUTION) ×2 IMPLANT
WEDGE FEM ARTHROPLASTY LG (Joint) ×1 IMPLANT
WEDGE FEM F/ARTHRO 11X10 (Joint) ×1 IMPLANT
WEDGE FEM F/ARTHRO LARGE (Joint) ×1 IMPLANT
WRAP KNEE MAXI GEL POST OP (GAUZE/BANDAGES/DRESSINGS) ×2 IMPLANT

## 2021-09-22 NOTE — Transfer of Care (Signed)
Immediate Anesthesia Transfer of Care Note  Patient: Patrick Fritz.  Procedure(s) Performed: RIGHT KNEE REMOVAL SPACER TOTAL KNEE REVISION (Right: Knee)  Patient Location: PACU  Anesthesia Type:Spinal  Level of Consciousness: awake, alert , oriented, drowsy and patient cooperative  Airway & Oxygen Therapy: Patient Spontanous Breathing and Patient connected to face mask oxygen  Post-op Assessment: Report given to RN and Post -op Vital signs reviewed and stable  Post vital signs: Reviewed and stable  Last Vitals:  Vitals Value Taken Time  BP 108/59 09/22/21 2001  Temp    Pulse 52 09/22/21 2002  Resp 22 09/22/21 2002  SpO2 100 % 09/22/21 2002  Vitals shown include unvalidated device data.  Last Pain:  Vitals:   09/22/21 1346  PainSc: 0-No pain         Complications: No notable events documented.

## 2021-09-22 NOTE — Anesthesia Procedure Notes (Signed)
Anesthesia Regional Block: Adductor canal block   Pre-Anesthetic Checklist: , timeout performed,  Correct Patient, Correct Site, Correct Laterality,  Correct Procedure, Correct Position, site marked,  Risks and benefits discussed,  Surgical consent,  Pre-op evaluation,  At surgeon's request and post-op pain management  Laterality: Right  Prep: chloraprep       Needles:  Injection technique: Single-shot  Needle Type: Echogenic Needle     Needle Length: 9cm      Additional Needles:   Procedures:,,,, ultrasound used (permanent image in chart),,    Narrative:  Start time: 09/22/2021 1:36 PM End time: 09/22/2021 1:41 PM Injection made incrementally with aspirations every 5 mL.  Performed by: Personally  Anesthesiologist: Myrtie Soman, MD  Additional Notes: Patient tolerated the procedure well without complications

## 2021-09-22 NOTE — Op Note (Signed)
OPERATIVE REPORT   09/22/2021  7:20 PM  PATIENT:  Patrick Fritz.   SURGEON:  Bertram Savin, MD  ASSISTANT: Cherlynn June, PA-C.   PREOPERATIVE DIAGNOSIS:  S/P resection right total knee arthroplasty, articulating antibiotic pacer placement  POSTOPERATIVE DIAGNOSIS:  Same.  PROCEDURE:  1.  Removal right knee articulating spacer. 2.  Revision right total knee arthroplasty, femoral and tibial components.  ANESTHESIA:   Spinal. Regional block. MAC.  ANTIBIOTICS: 2 g Ancef. 2 g ceftriaxone.  EXPLANTS: Prostalac articulating antibiotic spacer including CR femoral component, CR polyliner.  IMPLANTS: Zimmer persona revision femur size 11+ with 15 mm distal lateral augment, 10 mm distal medial augment, 17 x 135 mm, 6 mm offset splined stem. Persona revision trabecular metal femoral central cone, size large. Zimmer persona revision tibia size F with 15 mm augments x2, and 13 x 135 mm, 6 mm offset splined stem. Persona revision trabecular metal tibial central cone, size large. Persona revision Vivacit-E fixed-bearing CCK polyinsert, size 16 mm with locking screw and insert. Refobacin with gentamicin bone cement. Juggerknot double loaded 2.9 mm suture ankle with 2 #2 max braid sutures x2.  SPECIMENS: Right knee synovial fluid routine aerobic and anaerobic culture.  TUBES AND DRAINS: Prevena negative pressure incisional dressing. Medium Hemovac x1.  COMPLICATIONS: None.  DISPOSITION: Stable to PACU.  SURGICAL INDICATIONS:  Masayuki Sakai. is a 70 y.o. male who was diagnosed with chronic right knee periprosthetic joint infection in August of this year.  Shortly thereafter, he underwent resection total knee arthroplasty with placement of articulating antibiotic spacer.  Culture grew Streptococcus angina gnosis that was pan susceptible.  He completed 6 weeks of IV antibiotics.  After a 2-week antibiotic holiday, repeat aspiration was performed and found to be negative.  He  was therefore indicated for removal of articulating spacer, revision right total knee arthroplasty.  The risks, benefits, and alternatives were discussed with the patient. There are risks associated with the surgery including, but not limited to, problems with anesthesia (death), infection, instability (giving out of the joint), dislocation, differences in leg length/angulation/rotation, fracture of bones, loosening or failure of implants, hematoma (blood accumulation) which may require surgical drainage, blood clots, pulmonary embolism, nerve injury (foot drop and lateral thigh numbness), and blood vessel injury. The patient understands these risks and elects to proceed.  PROCEDURE IN DETAIL: The patient was identified in the holding area using 2 identifiers.  The surgical site was marked by myself.  Adductor canal block was placed by anesthesia.  He was taken to the operating room, and spinal anesthesia was obtained.  The patient was then placed supine.  Foley catheter was placed.  All bony prominences were well-padded.  Nonsterile tourniquet was applied to the right thigh.  The right lower extremity was prepped and draped in the normal sterile surgical fashion.  Timeout was called, verifying side and site of surgery.  He did receive IV antibiotics within 60 minutes of beginning the procedure.  Esmarch exsanguination was used, and the tourniquet was inflated to 320 mmHg.  #10 blade was used to sharply excise his previous anterior knee incision.  Full-thickness skin flaps were created.  Medial parapatellar arthrotomy was made.  Joint fluid appeared benign.  There was no significant evidence of chronic synovial inflammation.  Medial release was performed.  The knee was brought into extension.  Radical synovectomy was performed of the medial gutter, lateral gutter.  Infrapatellar scar was sharply excised.  The knee was then flexed up.  Using  an ACL saw, I disrupted the interface between the cement and  polyethylene liner of the proximal tibia.  Using an ACL saw, I disrupted the interface between the cement and femoral implant.  Using an osteotome, I easily removed the polyliner.  Using an osteotome, I then easily remove the femoral component.  There was no bone loss.  I disrupted the cement mantle from the proximal tibia using a straight osteotome.  Entry drills were used to regain entry into the medullary canal of the distal femur and proximal tibia.  I reamed up to 13 mm on the tibia.  Intramedullary cutting guide was used to freshen the proximal tibia cut.  I reamed up to 17 mm on the distal femur.  I sized the femur to an 11+.  I placed the 4-in-1 cutting block onto the distal femur, and determined that I needed to offset the construct medially.  Using laminar spreaders, I set the rotation of the femoral component and pinned the 4-in-1 cutting block into place.  I freshened the anterior, posterior, and chamfer cuts.  I replaced the femoral trial, and I determined that I needed a 15 mm distal augment laterally, and a 10 mm distal augment medially.  Posterior augments were not required.  There was a central defect in the proximal tibia.  I broached up for a size large tibial cone.  There was a defect in the lateral condyle of the distal femur.  I broached up for a size large femoral cone.  I sized the proximal tibia to an F.  I set the rotation to provide the best coverage.  The trial cones were placed, followed by the trial femoral and tibial implants.  I checked the gaps.  I determined that I needed additional augment height on the tibia.  Therefore, the trial was reassembled with 15 mm augments.  Trials were replaced.  The knee was brought through a range of motion.  The flexion and extension gaps were perfectly balanced.  The patella tracks centrally with no thumbs technique.  The trial components were all removed.  The real implants were opened and assembled on the back table.  The cut bony surfaces  were irrigated with pulse lavage and dried.  I placed the real femoral and tibial cones.  The tibial, and then the femoral component were cemented in place using a hybrid fixation technique.  Trial polyliner was placed, and the knee was brought into extension while the cement polymerized.  All excess cement was cleared.  I debrided all scar tissue from the patellar remnant.  Once the cement was fully hardened, I replaced the trial liner with the real CCK liner.  The locking bolt was placed according to manufacturer's instructions.  The knee was brought through a range of motion.  There was excellent varus/valgus stability throughout the range of motion.  The tourniquet was then let down.  Meticulous hemostasis was achieved with the aqua mantis and Bovie electrocautery.  The knee was then irrigated with Irrisept solution followed by normal saline with pulse lavage.  Approximately one third of the medial attachment of the patellar tendon had peeled off the tibial tubercle.  I placed 2 suture anchors and repaired the tendon over horizontal mattress sutures.    The arthrotomy was then closed with #1 Vicryl and #1 strata fix suture over a medium Hemovac drain left in the lateral gutter.  The deep dermal layer was reapproximated with 2-0 Monocryl suture.  The skin was closed with staples.  Customizable  Prevena negative pressure incisional dressing was placed according to manufacturer's instructions, and the suction was hooked up to 75 mmHg.  There was excellent seal without any leak.  Bulky dressing was placed with cast padding followed by Ace wrap.  Knee immobilizer was then placed.  The patient was then awakened from anesthesia, taken to the PACU in stable condition.  Sponge, needle, and instrument counts were correct at the end of the case x2.  There were no known complications.  POSTOPERATIVE PLAN: Postoperatively, the patient be admitted to the hospital.  Touchdown weightbearing right lower extremity with a  walker.  Knee immobilizer while out of bed for now.  Mobilize out of bed with physical therapy. We will monitor intraoperative cultures.  Beginning tomorrow, resume Xarelto at 10 mg daily.  Plan for 72 hours of IV ceftriaxone, discharge to home on oral antibiotics for at least 6 months.  The Hemovac drain can be discontinued when output is less than 30 cc per shift.  Upon discharge, we will exchange the house VAC suction unit to a portable Prevena suction unit.  He will need to follow-up in the office within 7 days of discharge for removal of the negative pressure dressing.

## 2021-09-22 NOTE — Progress Notes (Signed)
Assisted Dr. Rose with right, ultrasound guided, adductor canal block. Side rails up, monitors on throughout procedure. See vital signs in flow sheet. Tolerated Procedure well.  

## 2021-09-22 NOTE — Discharge Instructions (Addendum)
Dr. Rod Can Total Joint Specialist [facility] [address] [place], [place] [address] [phone]  TOTAL KNEE REPLACEMENT POSTOPERATIVE DIRECTIONS    Knee Rehabilitation, Guidelines Following Surgery  Results after knee surgery are often greatly improved when you follow the exercise, range of motion and muscle strengthening exercises prescribed by your doctor. Safety measures are also important to protect the knee from further injury. Any time any of these exercises cause you to have increased pain or swelling in your knee joint, decrease the amount until you are comfortable again and slowly increase them. If you have problems or questions, call your caregiver or physical therapist for advice.   WEIGHT BEARING Partial weight bearing with assist device as directed.  TDWB RLE with walker. Knee immobilizer while out of bed.  HOME CARE INSTRUCTIONS  Remove items at home which could result in a fall. This includes throw rugs or furniture in walking pathways.  Continue medications as instructed at time of discharge. You may have some home medications which will be placed on hold until you complete the course of blood thinner medication.  You may start showering once you are discharged home but do not submerge the incision under water. Just pat the incision dry and apply a dry gauze dressing on daily. Walk with walker as instructed.  You may resume a sexual relationship in one month or when given the OK by your doctor.  Use walker as long as suggested by your caregivers. Avoid periods of inactivity such as sitting longer than an hour when not asleep. This helps prevent blood clots.  You may put full weight on your legs and walk as much as is comfortable.  You may return to work once you are cleared by your doctor.  Do not drive a car for 6 weeks or until released by you surgeon.  Do not drive while taking narcotics.  Wear the elastic stockings for three  weeks following surgery during the day but you may remove then at night. Make sure you keep all of your appointments after your operation with all of your doctors and caregivers. You should call the office at the above phone number and make an appointment for approximately two weeks after the date of your surgery. Do not remove your surgical dressing. The dressing is waterproof; you may take showers in 3 days, but do not take tub baths or submerge the dressing. Please pick up a stool softener and laxative for home use as long as you are requiring pain medications. ICE to the affected knee every three hours for 30 minutes at a time and then as needed for pain and swelling.  Continue to use ice on the knee for pain and swelling from surgery. You may notice swelling that will progress down to the foot and ankle.  This is normal after surgery.  Elevate the leg when you are not up walking on it.   It is important for you to complete the blood thinner medication as prescribed by your doctor. Continue to use the breathing machine which will help keep your temperature down.  It is common for your temperature to cycle up and down following surgery, especially at night when you are not up moving around and exerting yourself.  The breathing machine keeps your lungs expanded and your temperature down.  RANGE OF MOTION AND STRENGTHENING EXERCISES  Rehabilitation of the knee is important following a knee injury or an operation. After just a few days of immobilization, the  muscles of the thigh which control the knee become weakened and shrink (atrophy). Knee exercises are designed to build up the tone and strength of the thigh muscles and to improve knee motion. Often times heat used for twenty to thirty minutes before working out will loosen up your tissues and help with improving the range of motion but do not use heat for the first two weeks following surgery. These exercises can be done on a training (exercise) mat,  on the floor, on a table or on a bed. Use what ever works the best and is most comfortable for you Knee exercises include:  Leg Lifts - While your knee is still immobilized in a splint or cast, you can do straight leg raises. Lift the leg to 60 degrees, hold for 3 sec, and slowly lower the leg. Repeat 10-20 times 2-3 times daily. Perform this exercise against resistance later as your knee gets better.  Quad and Hamstring Sets - Tighten up the muscle on the front of the thigh (Quad) and hold for 5-10 sec. Repeat this 10-20 times hourly. Hamstring sets are done by pushing the foot backward against an object and holding for 5-10 sec. Repeat as with quad sets.  A rehabilitation program following serious knee injuries can speed recovery and prevent re-injury in the future due to weakened muscles. Contact your doctor or a physical therapist for more information on knee rehabilitation.   POST-OPERATIVE OPIOID TAPER INSTRUCTIONS: It is important to wean off of your opioid medication as soon as possible. If you do not need pain medication after your surgery it is ok to stop day one. Opioids include: Codeine, Hydrocodone(Norco, Vicodin), Oxycodone(Percocet, oxycontin) and hydromorphone amongst others.  Long term and even short term use of opiods can cause: Increased pain response Dependence Constipation Depression Respiratory depression And more.  Withdrawal symptoms can include Flu like symptoms Nausea, vomiting And more Techniques to manage these symptoms Hydrate well Eat regular healthy meals Stay active Use relaxation techniques(deep breathing, meditating, yoga) Do Not substitute Alcohol to help with tapering If you have been on opioids for less than two weeks and do not have pain than it is ok to stop all together.  Plan to wean off of opioids This plan should start within one week post op of your joint replacement. Maintain the same interval or time between taking each dose and first  decrease the dose.  Cut the total daily intake of opioids by one tablet each day Next start to increase the time between doses. The last dose that should be eliminated is the evening dose.    SKILLED REHAB INSTRUCTIONS: If the patient is transferred to a skilled rehab facility following release from the hospital, a list of the current medications will be sent to the facility for the patient to continue.  When discharged from the skilled rehab facility, please have the facility set up the patient's [place] prior to being released. Also, the skilled facility will be responsible for providing the patient with their medications at time of release from the facility to include their pain medication, the muscle relaxants, and their blood thinner medication. If the patient is still at the rehab facility at time of the two week follow up appointment, the skilled rehab facility will also need to assist the patient in arranging follow up appointment in our office and any transportation needs.  MAKE SURE YOU:  Understand these instructions.  Will watch your condition.  Will get help right away if  you are not doing well or get worse.    Pick up stool softner and laxative for home use following surgery while on pain medications. Do NOT remove your dressing. You may shower.  Do not take tub baths or submerge incision under water. May shower starting three days after surgery. Please use a clean towel to pat the incision dry following showers. Continue to use ice for pain and swelling after surgery. Do not use any lotions or creams on the incision until instructed by your surgeon.  Touch down weight bearing right lower extremity with walker. Knee immobilizer while out of bed.  Charge VAC unit nightly.

## 2021-09-22 NOTE — Anesthesia Procedure Notes (Signed)
Spinal  Patient location during procedure: OR Start time: 09/22/2021 3:00 PM End time: 09/22/2021 3:04 PM Reason for block: surgical anesthesia Staffing Performed: anesthesiologist  Anesthesiologist: Myrtie Soman, MD Preanesthetic Checklist Completed: patient identified, IV checked, site marked, risks and benefits discussed, surgical consent, monitors and equipment checked, pre-op evaluation and timeout performed Spinal Block Patient position: sitting Prep: Betadine Patient monitoring: heart rate, continuous pulse ox and blood pressure Approach: midline Location: L3-4 Injection technique: single-shot Needle Needle type: Sprotte  Needle gauge: 24 G Needle length: 9 cm Assessment Sensory level: T6 Events: CSF return Additional Notes

## 2021-09-22 NOTE — Interval H&P Note (Signed)
History and Physical Interval Note:  09/22/2021 2:01 PM  [name].  has presented today for surgery, with the diagnosis of S/P resection total knee arthroplasty, antibiotic pacer placement.  The various methods of treatment have been discussed with the patient and family. After consideration of risks, benefits and other options for treatment, the patient has consented to  Procedure(s) with comments: RIGHT KNEE REMOVAL SPACER TOTAL KNEE REVISION (Right) - 210 as a surgical intervention.  The patient's history has been reviewed, patient examined, no change in status, stable for surgery.  I have reviewed the patient's chart and labs.  Questions were answered to the patient's satisfaction.     Hilton Cork Jaquel Coomer

## 2021-09-22 NOTE — Anesthesia Procedure Notes (Signed)
Anesthesia Procedure Image    

## 2021-09-23 ENCOUNTER — Other Ambulatory Visit: Payer: Self-pay

## 2021-09-23 LAB — BASIC METABOLIC PANEL
Anion gap: 4 — ABNORMAL LOW (ref 5–15)
BUN: 14 mg/dL (ref 8–23)
CO2: 29 mmol/L (ref 22–32)
Calcium: 8.5 mg/dL — ABNORMAL LOW (ref 8.9–10.3)
Chloride: 105 mmol/L (ref 98–111)
Creatinine, Ser: 1.08 mg/dL (ref 0.61–1.24)
GFR, Estimated: >60 mL/min (ref >60)
Glucose, Bld: 153 mg/dL — ABNORMAL HIGH (ref 70–99)
Potassium: 4.8 mmol/L (ref 3.5–5.1)
Sodium: 138 mmol/L (ref 135–145)

## 2021-09-23 LAB — GLUCOSE, CAPILLARY
Glucose-Capillary: 130 mg/dL — ABNORMAL HIGH (ref 70–99)
Glucose-Capillary: 153 mg/dL — ABNORMAL HIGH (ref 70–99)
Glucose-Capillary: 181 mg/dL — ABNORMAL HIGH (ref 70–99)
Glucose-Capillary: 156 mg/dL — ABNORMAL HIGH (ref 70–99)

## 2021-09-23 LAB — CBC
HCT: 32.2 % — ABNORMAL LOW (ref 39.0–52.0)
Hemoglobin: 10.3 g/dL — ABNORMAL LOW (ref 13.0–17.0)
MCH: 28.3 pg (ref 26.0–34.0)
MCHC: 32 g/dL (ref 30.0–36.0)
MCV: 88.5 fL (ref 80.0–100.0)
Platelets: 149 10*3/uL — ABNORMAL LOW (ref 150–400)
RBC: 3.64 MIL/uL — ABNORMAL LOW (ref 4.22–5.81)
RDW: 14.2 % (ref 11.5–15.5)
WBC: 8.4 10*3/uL (ref 4.0–10.5)
nRBC: 0 % (ref 0.0–0.2)

## 2021-09-23 MED ORDER — SODIUM CHLORIDE 0.9 % IV SOLN: INTRAVENOUS | Status: DC | PRN

## 2021-09-23 NOTE — Evaluation (Signed)
Physical Therapy Evaluation Patient Details Name: Patrick Fritz. MRN: [id number] DOB: December 18, 1950 Today's Date: 09/23/2021  History of Present Illness  Pt s/p R TKR resection 05/26/21 and now with reimplantation of TKR.  Pt with hx of DVT, PE, glaucoma, bil TKR in 1996 and CABG in 2017.  Clinical Impression  Pt admitted as above and presenting with functional mobility limitations 2* decreased R LE strength/ROM, post op pain and TDWB on R LE.  Pt should progress to dc home with family assist.     Recommendations for follow up therapy are one component of a multi-disciplinary discharge planning process, led by the attending physician.  Recommendations may be updated based on patient status, additional functional criteria and insurance authorization.  Follow Up Recommendations Follow physician's recommendations for discharge plan and follow up therapies    Assistance Recommended at Discharge Frequent or constant Supervision/Assistance  Functional Status Assessment Patient has had a recent decline in their functional status and demonstrates the ability to make significant improvements in function in a reasonable and predictable amount of time.  Equipment Recommendations  None recommended by PT    Recommendations for Other Services       Precautions / Restrictions Precautions Precautions: Fall;Knee;Other (comment) Precaution Comments: NO ROM at knee at this time Required Braces or Orthoses: Knee Immobilizer - Right Knee Immobilizer - Right: On at all times Restrictions Weight Bearing Restrictions: Yes RLE Weight Bearing: Touchdown weight bearing      Mobility  Bed Mobility Overal bed mobility: Needs Assistance Bed Mobility: Supine to Sit     Supine to sit: Min assist     General bed mobility comments: assist to manage R LE    Transfers Overall transfer level: Needs assistance Equipment used: Rolling walker (2 wheels) Transfers: Sit to/from Stand Sit to Stand: Min  assist           General transfer comment: cues for LE management and use of UEs to self assist    Ambulation/Gait Ambulation/Gait assistance: Min assist Gait Distance (Feet): 42 Feet Assistive device: Rolling walker (2 wheels) Gait Pattern/deviations: Step-to pattern;Decreased step length - right;Decreased step length - left;Scissoring;Trunk flexed Gait velocity: decr     General Gait Details: cues for posture, sequence, position from RW and TDWB on R  Stairs            Wheelchair Mobility    Modified Rankin (Stroke Patients Only)       Balance Overall balance assessment: Mild deficits observed, not formally tested                                           Pertinent Vitals/Pain Pain Assessment: 0-10 Pain Score: 4  Pain Location: R knee Pain Descriptors / Indicators: Aching;Sore Pain Intervention(s): Limited activity within patient's tolerance;Monitored during session;Premedicated before session    Eunice expects to be discharged to:: Private residence Living Arrangements: Spouse/significant other;Children Available Help at Discharge: Available 24 hours/day Type of Home: House Home Access: Stairs to enter       Home Layout: One level Home Equipment: None      Prior Function Prior Level of Function : Independent/Modified Independent                     Hand Dominance   Dominant Hand: Right    Extremity/Trunk Assessment   Upper Extremity Assessment Upper Extremity Assessment:  Overall WFL for tasks assessed    Lower Extremity Assessment Lower Extremity Assessment: RLE deficits/detail RLE: Unable to fully assess due to immobilization    Cervical / Trunk Assessment Cervical / Trunk Assessment: Normal  Communication   Communication: No difficulties  Cognition Arousal/Alertness: Awake/alert Behavior During Therapy: WFL for tasks assessed/performed Overall Cognitive Status: Within Functional Limits  for tasks assessed                                          General Comments      Exercises Total Joint Exercises Ankle Circles/Pumps: AROM;Both;15 reps;Supine   Assessment/Plan    PT Assessment Patient needs continued PT services  PT Problem List Decreased strength;Decreased range of motion;Decreased activity tolerance;Decreased balance;Decreased mobility;Decreased knowledge of use of DME;Pain       PT Treatment Interventions DME instruction;Gait training;Stair training;Functional mobility training;Therapeutic activities;Therapeutic exercise;Patient/family education    PT Goals (Current goals can be found in the Care Plan section)  Acute Rehab PT Goals Patient Stated Goal: Regain IND PT Goal Formulation: With patient Time For Goal Achievement: 09/30/21 Potential to Achieve Goals: Good    Frequency 7X/week   Barriers to discharge        Co-evaluation               AM-PAC PT "6 Clicks" Mobility  Outcome Measure Help needed turning from your back to your side while in a flat bed without using bedrails?: A Little Help needed moving from lying on your back to sitting on the side of a flat bed without using bedrails?: A Little Help needed moving to and from a bed to a chair (including a wheelchair)?: A Little Help needed standing up from a chair using your arms (e.g., wheelchair or bedside chair)?: A Little Help needed to walk in hospital room?: A Little Help needed climbing 3-5 steps with a railing? : A Lot 6 Click Score: 17    End of Session Equipment Utilized During Treatment: Gait belt;Right knee immobilizer Activity Tolerance: Patient tolerated treatment well Patient left: in chair;with call bell/phone within reach Nurse Communication: Mobility status PT Visit Diagnosis: Unsteadiness on feet (R26.81);Difficulty in walking, not elsewhere classified (R26.2)    Time: 6629-4765 PT Time Calculation (min) (ACUTE ONLY): 35 min   Charges:   PT  Evaluation $PT Eval Low Complexity: 1 Low PT Treatments $Gait Training: 8-22 mins        Debe Coder PT Acute Rehabilitation Services Pager [phone] Office [phone]   Kenlea Woodell 09/23/2021, 9:35 AM

## 2021-09-23 NOTE — Anesthesia Postprocedure Evaluation (Signed)
Anesthesia Post Note  Patient: Patrick Fritz.  Procedure(s) Performed: RIGHT KNEE REMOVAL SPACER TOTAL KNEE REVISION (Right: Knee)     Patient location during evaluation: PACU Anesthesia Type: Spinal Level of consciousness: oriented and awake and alert Pain management: pain level controlled Vital Signs Assessment: post-procedure vital signs reviewed and stable Respiratory status: spontaneous breathing, respiratory function stable and patient connected to nasal cannula oxygen Cardiovascular status: blood pressure returned to baseline and stable Postop Assessment: no headache, no backache and no apparent nausea or vomiting Anesthetic complications: no   No notable events documented.  Last Vitals:  Vitals:   09/22/21 2140 09/22/21 2347  BP: 128/67 125/61  Pulse: (!) 53 70  Resp: 18 18  Temp: 36.5 C 36.6 C  SpO2: 100% 100%    Last Pain:  Vitals:   09/22/21 2347  TempSrc: Oral  PainSc:                  Cephus Tupy S

## 2021-09-23 NOTE — Progress Notes (Signed)
Physical Therapy Treatment Patient Details Name: Patrick Fritz. MRN: [id number] DOB: [birth year] Today's Date: 09/23/2021   History of Present Illness Pt s/p R TKR resection 05/26/21 and now with reimplantation of TKR.  Pt with hx of DVT, PE, glaucoma, bil TKR in 1996 and CABG in 2017.    PT Comments    Pt continues very motivated and progressing well but with limited pain requiring increased cueing to limit WB on R LE.   Recommendations for follow up therapy are one component of a multi-disciplinary discharge planning process, led by the attending physician.  Recommendations may be updated based on patient status, additional functional criteria and insurance authorization.  Follow Up Recommendations  Follow physician's recommendations for discharge plan and follow up therapies     Assistance Recommended at Discharge Frequent or constant Supervision/Assistance  Equipment Recommendations  None recommended by PT    Recommendations for Other Services       Precautions / Restrictions Precautions Precautions: Fall;Knee;Other (comment) Precaution Comments: NO ROM at knee at this time Required Braces or Orthoses: Knee Immobilizer - Right Knee Immobilizer - Right: On at all times Restrictions RLE Weight Bearing: Touchdown weight bearing     Mobility  Bed Mobility Overal bed mobility: Needs Assistance Bed Mobility: Sit to Supine       Sit to supine: Min assist   General bed mobility comments: assist to manage R LE    Transfers Overall transfer level: Needs assistance Equipment used: Rolling walker (2 wheels) Transfers: Sit to/from Stand Sit to Stand: Min assist           General transfer comment: cues for LE management and use of UEs to self assist    Ambulation/Gait Ambulation/Gait assistance: Min assist;Min guard Gait Distance (Feet): 45 Feet Assistive device: Rolling walker (2 wheels) Gait Pattern/deviations: Step-to pattern;Decreased step length -  right;Decreased step length - left;Scissoring;Trunk flexed Gait velocity: decr     General Gait Details: cues for posture, sequence, position from RW and TDWB on R   Stairs             Wheelchair Mobility    Modified Rankin (Stroke Patients Only)       Balance Overall balance assessment: Mild deficits observed, not formally tested                                          Cognition Arousal/Alertness: Awake/alert Behavior During Therapy: WFL for tasks assessed/performed Overall Cognitive Status: Within Functional Limits for tasks assessed                                          Exercises      General Comments        Pertinent Vitals/Pain Pain Assessment: 0-10 Pain Score: 4  Pain Location: R knee Pain Descriptors / Indicators: Aching;Sore Pain Intervention(s): Limited activity within patient's tolerance;Monitored during session    Home Living                          Prior Function            PT Goals (current goals can now be found in the care plan section) Acute Rehab PT Goals Patient Stated Goal: Regain IND PT Goal Formulation: With patient Time  For Goal Achievement: 09/30/21 Potential to Achieve Goals: Good Progress towards PT goals: Progressing toward goals    Frequency    7X/week      PT Plan Current plan remains appropriate    Co-evaluation              AM-PAC PT "6 Clicks" Mobility   Outcome Measure  Help needed turning from your back to your side while in a flat bed without using bedrails?: A Little Help needed moving from lying on your back to sitting on the side of a flat bed without using bedrails?: A Little Help needed moving to and from a bed to a chair (including a wheelchair)?: A Little Help needed standing up from a chair using your arms (e.g., wheelchair or bedside chair)?: A Little Help needed to walk in hospital room?: A Little Help needed climbing 3-5 steps with a  railing? : A Lot 6 Click Score: 17    End of Session Equipment Utilized During Treatment: Gait belt;Right knee immobilizer Activity Tolerance: Patient tolerated treatment well Patient left: in bed;with call bell/phone within reach;with family/visitor present Nurse Communication: Mobility status PT Visit Diagnosis: Unsteadiness on feet (R26.81);Difficulty in walking, not elsewhere classified (R26.2)     Time: 5732-2025 PT Time Calculation (min) (ACUTE ONLY): 21 min  Charges:  $Gait Training: 8-22 mins                     [place] Pager [phone] Office [phone]    Delio Slates 09/23/2021, 4:37 PM

## 2021-09-23 NOTE — Progress Notes (Signed)
    Subjective:  Patient reports pain as mild.  Denies N/V/CP/SOB.   Objective:   VITALS:   Vitals:   09/22/21 2347 09/23/21 0229 09/23/21 0539 09/23/21 1045  BP: 125/61 (!) 138/59 126/77 (!) 113/57  Pulse: 70 (!) 59 66 69  Resp: _0 Temp: 97.9 F (36.6 C) 97.6 F (36.4 C) 98 F (36.7 C) 98.3 F (36.8 C)  TempSrc: Oral     SpO2: 100% 100% 100% 95%  Weight: 101.1 kg     Height: _1  (1.778 m)       NAD ABD soft Neurovascular intact Sensation intact distally Intact pulses distally Dorsiflexion/Plantar flexion intact Incision: dressing C/D/I Hemovac in place Prevena dressing connected to wound vac Knee immobilizer in place  Lab Results  Component Value Date   WBC 8.4 09/23/2021   HGB 10.3 (L) 09/23/2021   HCT 32.2 (L) 09/23/2021   MCV 88.5 09/23/2021   PLT 149 (L) 09/23/2021   BMET    Component Value Date/Time   NA 138 09/23/2021 0347   NA 139 04/07/2021 0815   K 4.8 09/23/2021 0347   CL 105 09/23/2021 0347   CO2 29 09/23/2021 0347   GLUCOSE 153 (H) 09/23/2021 0347   BUN 14 09/23/2021 0347   BUN 13 04/07/2021 0815   CREATININE 1.08 09/23/2021 0347   CREATININE 1.02 06/08/2016 1041   CALCIUM 8.5 (L) 09/23/2021 0347   EGFR 91 04/07/2021 0815   GFRNONAA >60 09/23/2021 0347     Assessment/Plan: 1 Day Post-Op   Principal Problem:   Infection of total knee replacement (HCC) Active Problems:   Failed orthopedic implant, initial encounter ([place])   TDWB RLE with walker DVT ppx: Xarelto, SCDs, TEDS PO pain control PT/OT Knee immobilizer to stay in place other than therapy Discontinue hemovac when output less than 30cc per shift Prevena should be connected to portable unit (in room) at discharge Dispo: Planning. Probable stay over weekend for pain control     Dorothyann Peng 09/23/2021, 10:50 AM   [place] is now [facility] [address] [place], [place], [place] [address] Phone:  [phone] [email or web address] Facebook  [employer]

## 2021-09-23 NOTE — Plan of Care (Signed)
  Problem: Education: Goal: Knowledge of General Education information will improve Description: Including pain rating scale, medication(s)/side effects and non-pharmacologic comfort measures Outcome: Progressing   Problem: Pain Managment: Goal: General experience of comfort will improve Outcome: Progressing   Problem: Safety: Goal: Ability to remain free from injury will improve Outcome: Progressing   

## 2021-09-23 NOTE — TOC Transition Note (Signed)
Transition of Care [facility]) - CM/SW Discharge Note   Patient Details  Name: Patrick Fritz. MRN: [id number] Date of Birth: [birth year]  [facility] of [facility]) CM/SW Contact:  Lennart Pall, LCSW Phone Number: 09/23/2021, 2:29 PM   Clinical Narrative:    Met with pt to discuss any dc needs.  Pt confirms he has all needed DME at home.  Pt and PA confirm plan for OPPT already arranged.  PA notes pt will change to p.o. abx at dc and will be placed on Prevena VAC which is already in the room. No further TOC needs.   Final next level of care: OP Rehab Barriers to Discharge: Continued Medical Work up   Patient Goals and CMS Choice Patient states their goals for this hospitalization and ongoing recovery are:: return home      Discharge Placement                       Discharge Plan and Services                DME Arranged: N/A DME Agency: NA                  Social Determinants of Health (SDOH) Interventions     Readmission Risk Interventions No flowsheet data found.

## 2021-09-24 ENCOUNTER — Other Ambulatory Visit (HOSPITAL_COMMUNITY): Payer: Self-pay

## 2021-09-24 LAB — GLUCOSE, CAPILLARY
Glucose-Capillary: 85 mg/dL (ref 70–99)
Glucose-Capillary: 94 mg/dL (ref 70–99)
Glucose-Capillary: 121 mg/dL — ABNORMAL HIGH (ref 70–99)
Glucose-Capillary: 158 mg/dL — ABNORMAL HIGH (ref 70–99)

## 2021-09-24 LAB — CBC
HCT: 28.1 % — ABNORMAL LOW (ref 39.0–52.0)
Hemoglobin: 9.2 g/dL — ABNORMAL LOW (ref 13.0–17.0)
MCH: 28.1 pg (ref 26.0–34.0)
MCHC: 32.7 g/dL (ref 30.0–36.0)
MCV: 85.9 fL (ref 80.0–100.0)
Platelets: 152 10*3/uL (ref 150–400)
RBC: 3.27 MIL/uL — ABNORMAL LOW (ref 4.22–5.81)
RDW: 14.2 % (ref 11.5–15.5)
WBC: 11.2 10*3/uL — ABNORMAL HIGH (ref 4.0–10.5)
nRBC: 0 % (ref 0.0–0.2)

## 2021-09-24 NOTE — Progress Notes (Signed)
Physical Therapy Treatment Patient Details Name: Patrick Fritz. MRN: [id number] DOB: [birth year] Today's Date: 09/24/2021   History of Present Illness Pt s/p R TKR resection 05/26/21 and now with reimplantation of TKR.  Pt with hx of DVT, PE, glaucoma, bil TKR in 1996 and CABG in 2017.    PT Comments    Pt continues very motivated and up to ambulate in hall and negotiate stairs.  Pt sequencing well with RW but with cues for TDWB on R.   Recommendations for follow up therapy are one component of a multi-disciplinary discharge planning process, led by the attending physician.  Recommendations may be updated based on patient status, additional functional criteria and insurance authorization.  Follow Up Recommendations  Follow physician's recommendations for discharge plan and follow up therapies     Assistance Recommended at Discharge Frequent or constant Supervision/Assistance  Equipment Recommendations  None recommended by PT    Recommendations for Other Services       Precautions / Restrictions Precautions Precautions: Fall;Knee;Other (comment) Precaution Comments: NO ROM at knee at this time Required Braces or Orthoses: Knee Immobilizer - Right Knee Immobilizer - Right: On at all times Restrictions Weight Bearing Restrictions: Yes RLE Weight Bearing: Touchdown weight bearing     Mobility  Bed Mobility Overal bed mobility: Needs Assistance Bed Mobility: Sit to Supine       Sit to supine: Min guard   General bed mobility comments: min guard for R LE only    Transfers Overall transfer level: Needs assistance Equipment used: Rolling walker (2 wheels) Transfers: Sit to/from Stand Sit to Stand: Min guard           General transfer comment: cues for LE management and use of UEs to self assist    Ambulation/Gait Ambulation/Gait assistance: Min guard;Supervision Gait Distance (Feet): 85 Feet Assistive device: Rolling walker (2 wheels) Gait Pattern/deviations:  Step-to pattern;Decreased step length - right;Decreased step length - left;Trunk flexed;Shuffle Gait velocity: decr     General Gait Details: cues for posture, position from RW and TDWB on R   Stairs Stairs: Yes Stairs assistance: Min assist Stair Management: No rails;Step to pattern;Backwards;With walker Number of Stairs: 2 General stair comments: cues for sequence and foot/RW placement   Wheelchair Mobility    Modified Rankin (Stroke Patients Only)       Balance Overall balance assessment: Mild deficits observed, not formally tested                                          Cognition Arousal/Alertness: Awake/alert Behavior During Therapy: WFL for tasks assessed/performed Overall Cognitive Status: Within Functional Limits for tasks assessed                                          Exercises      General Comments        Pertinent Vitals/Pain Pain Assessment: 0-10 Pain Score: 3  Pain Location: R knee Pain Descriptors / Indicators: Aching;Sore Pain Intervention(s): Limited activity within patient's tolerance;Monitored during session    Home Living                          Prior Function            PT Goals (current  goals can now be found in the care plan section) Acute Rehab PT Goals Patient Stated Goal: Regain IND PT Goal Formulation: With patient Time For Goal Achievement: 09/30/21 Potential to Achieve Goals: Good Progress towards PT goals: Progressing toward goals    Frequency    7X/week      PT Plan Current plan remains appropriate    Co-evaluation              AM-PAC PT "6 Clicks" Mobility   Outcome Measure  Help needed turning from your back to your side while in a flat bed without using bedrails?: A Little Help needed moving from lying on your back to sitting on the side of a flat bed without using bedrails?: A Little Help needed moving to and from a bed to a chair (including a  wheelchair)?: A Little Help needed standing up from a chair using your arms (e.g., wheelchair or bedside chair)?: A Little Help needed to walk in hospital room?: A Little Help needed climbing 3-5 steps with a railing? : A Little 6 Click Score: 18    End of Session Equipment Utilized During Treatment: Gait belt;Right knee immobilizer Activity Tolerance: Patient tolerated treatment well Patient left: in bed;with call bell/phone within reach Nurse Communication: Mobility status PT Visit Diagnosis: Unsteadiness on feet (R26.81);Difficulty in walking, not elsewhere classified (R26.2)     Time: 3435-6861 PT Time Calculation (min) (ACUTE ONLY): 23 min  Charges:  $Gait Training: 8-22 mins                     [place] Pager [phone] Office [phone]    Zayveon Raschke 09/24/2021, 4:47 PM

## 2021-09-24 NOTE — Plan of Care (Signed)
  Problem: Education: Goal: Knowledge of General Education information will improve Description: Including pain rating scale, medication(s)/side effects and non-pharmacologic comfort measures Outcome: Progressing   Problem: Coping: Goal: Level of anxiety will decrease Outcome: Progressing   Problem: Pain Managment: Goal: General experience of comfort will improve Outcome: Progressing   

## 2021-09-24 NOTE — Plan of Care (Signed)
  Problem: Education: Goal: Knowledge of General Education information will improve Description: Including pain rating scale, medication(s)/side effects and non-pharmacologic comfort measures Outcome: Progressing   Problem: Activity: Goal: Risk for activity intolerance will decrease Outcome: Progressing   Problem: Pain Managment: Goal: General experience of comfort will improve Outcome: Progressing   

## 2021-09-24 NOTE — Progress Notes (Signed)
   Subjective: 2 Days Post-Op Procedure(s) (LRB): RIGHT KNEE REMOVAL SPACER TOTAL KNEE REVISION (Right)  Pt c/o mild pain and continued mild drainage in hemovac Denies any new symptoms or issues Otherwise doing fairly well Patient reports pain as mild.  Objective:   VITALS:   Vitals:   09/23/21 1755 09/23/21 2150  BP: 131/67 128/60  Pulse: 71 66  Resp: 17 18  Temp: 98.6 F (37 C) 99.2 F (37.3 C)  SpO2: 96% 97%    Right knee dressing, immobilizer and hemovac intact Nv intact distally No rashes or edema distally   LABS Recent Labs    09/23/21 0347 09/24/21 0344  HGB 10.3* 9.2*  HCT 32.2* 28.1*  WBC 8.4 11.2*  PLT 149* 152    Recent Labs    09/23/21 0347  NA 138  K 4.8  BUN 14  CREATININE 1.08  GLUCOSE 153*     Assessment/Plan: 2 Days Post-Op Procedure(s) (LRB): RIGHT KNEE REMOVAL SPACER TOTAL KNEE REVISION (Right) Continue PT/OT and antibiotics Expect hemovac removal tomorrow as long as bleeding continues to decrease Will continue to monitor his progress Pain management as needed     Kathrynn Speed, MPAS [place] is now [employer] [facility] [address] [place], [place], [place] [address] Phone: [phone] [email or web address] Facebook  [employer]

## 2021-09-24 NOTE — Plan of Care (Signed)
  Problem: Education: Goal: Knowledge of General Education information will improve Description: Including pain rating scale, medication(s)/side effects and non-pharmacologic comfort measures Outcome: Progressing   Problem: Pain Managment: Goal: General experience of comfort will improve Outcome: Progressing   Problem: Safety: Goal: Ability to remain free from injury will improve Outcome: Progressing   

## 2021-09-24 NOTE — Progress Notes (Signed)
Physical Therapy Treatment Patient Details Name: Patrick Fritz. MRN: [id number] DOB: 1951-03-17 Today's Date: 09/24/2021   History of Present Illness Pt s/p R TKR resection 05/26/21 and now with reimplantation of TKR.  Pt with hx of DVT, PE, glaucoma, bil TKR in 1996 and CABG in 2017.    PT Comments    Pt continues very motivated and eager to return home.  Pt up to ambulate increased distance in hall but continues to require reminders for TWB follow through.   Recommendations for follow up therapy are one component of a multi-disciplinary discharge planning process, led by the attending physician.  Recommendations may be updated based on patient status, additional functional criteria and insurance authorization.  Follow Up Recommendations  Follow physician's recommendations for discharge plan and follow up therapies     Assistance Recommended at Discharge Frequent or constant Supervision/Assistance  Equipment Recommendations  None recommended by PT    Recommendations for Other Services       Precautions / Restrictions Precautions Precautions: Fall;Knee;Other (comment) Precaution Comments: NO ROM at knee at this time Required Braces or Orthoses: Knee Immobilizer - Right Knee Immobilizer - Right: On at all times Restrictions RLE Weight Bearing: Touchdown weight bearing     Mobility  Bed Mobility Overal bed mobility: Needs Assistance Bed Mobility: Supine to Sit     Supine to sit: Min guard     General bed mobility comments: min guard for R LE only    Transfers Overall transfer level: Needs assistance Equipment used: Rolling walker (2 wheels) Transfers: Sit to/from Stand Sit to Stand: Min guard           General transfer comment: cues for LE management and use of UEs to self assist    Ambulation/Gait Ambulation/Gait assistance: Min guard Gait Distance (Feet): 78 Feet Assistive device: Rolling walker (2 wheels) Gait Pattern/deviations: Step-to  pattern;Decreased step length - right;Decreased step length - left;Trunk flexed;Shuffle Gait velocity: decr     General Gait Details: cues for posture, position from RW and TDWB on R   Stairs             Wheelchair Mobility    Modified Rankin (Stroke Patients Only)       Balance Overall balance assessment: Mild deficits observed, not formally tested                                          Cognition Arousal/Alertness: Awake/alert Behavior During Therapy: WFL for tasks assessed/performed Overall Cognitive Status: Within Functional Limits for tasks assessed                                          Exercises Total Joint Exercises Ankle Circles/Pumps: AROM;Both;15 reps;Supine Quad Sets: AROM;Both;10 reps;Supine Hip ABduction/ADduction: AAROM;Right;15 reps;Supine Straight Leg Raises: AAROM;AROM;Right;15 reps;Supine    General Comments        Pertinent Vitals/Pain Pain Assessment: 0-10 Pain Score: 4  Pain Location: R knee Pain Descriptors / Indicators: Aching;Sore Pain Intervention(s): Limited activity within patient's tolerance    Home Living                          Prior Function            PT Goals (current goals can now  be found in the care plan section) Acute Rehab PT Goals Patient Stated Goal: Regain IND PT Goal Formulation: With patient Time For Goal Achievement: 09/30/21 Potential to Achieve Goals: Good Progress towards PT goals: Progressing toward goals    Frequency    7X/week      PT Plan Current plan remains appropriate    Co-evaluation              AM-PAC PT "6 Clicks" Mobility   Outcome Measure  Help needed turning from your back to your side while in a flat bed without using bedrails?: A Little Help needed moving from lying on your back to sitting on the side of a flat bed without using bedrails?: A Little Help needed moving to and from a bed to a chair (including a  wheelchair)?: A Little Help needed standing up from a chair using your arms (e.g., wheelchair or bedside chair)?: A Little Help needed to walk in hospital room?: A Little Help needed climbing 3-5 steps with a railing? : A Lot 6 Click Score: 17    End of Session Equipment Utilized During Treatment: Gait belt;Right knee immobilizer Activity Tolerance: Patient tolerated treatment well Patient left: in chair;with call bell/phone within reach;with chair alarm set Nurse Communication: Mobility status PT Visit Diagnosis: Unsteadiness on feet (R26.81);Difficulty in walking, not elsewhere classified (R26.2)     Time: 7092-9574 PT Time Calculation (min) (ACUTE ONLY): 30 min  Charges:  $Gait Training: 8-22 mins $Therapeutic Exercise: 8-22 mins                     Debe Coder PT Acute Rehabilitation Services Pager [phone] Office [phone]    Patrick Fritz 09/24/2021, 12:50 PM

## 2021-09-25 LAB — CBC
HCT: 28.9 % — ABNORMAL LOW (ref 39.0–52.0)
Hemoglobin: 9.5 g/dL — ABNORMAL LOW (ref 13.0–17.0)
MCH: 28.5 pg (ref 26.0–34.0)
MCHC: 32.9 g/dL (ref 30.0–36.0)
MCV: 86.8 fL (ref 80.0–100.0)
Platelets: 173 10*3/uL (ref 150–400)
RBC: 3.33 MIL/uL — ABNORMAL LOW (ref 4.22–5.81)
RDW: 14.6 % (ref 11.5–15.5)
WBC: 8.1 10*3/uL (ref 4.0–10.5)
nRBC: 0 % (ref 0.0–0.2)

## 2021-09-25 LAB — GLUCOSE, CAPILLARY: Glucose-Capillary: 89 mg/dL (ref 70–99)

## 2021-09-25 MED ORDER — SENNA 8.6 MG PO TABS: 2.0000 | ORAL_TABLET | Freq: Every day | ORAL | 2 refills | Status: DC

## 2021-09-25 MED ORDER — DOCUSATE SODIUM 100 MG PO CAPS: 100.0000 mg | ORAL_CAPSULE | Freq: Two times a day (BID) | ORAL | 2 refills | Status: DC

## 2021-09-25 MED ORDER — OXYCODONE HCL 5 MG PO TABS: 5.0000 mg | ORAL_TABLET | ORAL | 0 refills | Status: DC | PRN

## 2021-09-25 MED ORDER — ONDANSETRON HCL 4 MG PO TABS
4.0000 mg | ORAL_TABLET | Freq: Four times a day (QID) | ORAL | 0 refills | Status: DC | PRN
Start: 2021-09-25 — End: 2022-01-03

## 2021-09-25 MED ORDER — DOXYCYCLINE HYCLATE 50 MG PO CAPS: 50.0000 mg | ORAL_CAPSULE | Freq: Two times a day (BID) | ORAL | 6 refills | Status: DC

## 2021-09-25 MED ORDER — METHOCARBAMOL 500 MG PO TABS
500.0000 mg | ORAL_TABLET | Freq: Four times a day (QID) | ORAL | 0 refills | Status: DC | PRN
Start: 2021-09-25 — End: 2021-12-31

## 2021-09-25 MED ORDER — POLYETHYLENE GLYCOL 3350 17 G PO PACK: 17.0000 g | PACK | Freq: Every day | ORAL | 0 refills | Status: DC | PRN

## 2021-09-25 NOTE — Progress Notes (Signed)
RN was assisting Pt out of bed for bathroom. Pt hemovac tubing was pulled out and started bleeding. Pt was assisted to bed and tubing was connected back. Pt was put back on  bedpan. Report given to day shift RN. Will continue to monitor.

## 2021-09-25 NOTE — Progress Notes (Signed)
Physical Therapy Treatment Patient Details Name: Patrick Fritz. MRN: [id number] DOB: 1950/12/09 Today's Date: 09/25/2021   History of Present Illness Pt s/p R TKR resection 05/26/21 and now with reimplantation of TKR.  Pt with hx of DVT, PE, glaucoma, bil TKR in 1996 and CABG in 2017.    PT Comments    Pt continues progressing well with mobility and able to demonstrate ability to ambulate TDWB with RW but with reminders needed for consistent follow through.  Pt eager for dc home this date.   Recommendations for follow up therapy are one component of a multi-disciplinary discharge planning process, led by the attending physician.  Recommendations may be updated based on patient status, additional functional criteria and insurance authorization.  Follow Up Recommendations  Follow physician's recommendations for discharge plan and follow up therapies     Assistance Recommended at Discharge Frequent or constant Supervision/Assistance  Equipment Recommendations  None recommended by PT    Recommendations for Other Services       Precautions / Restrictions Precautions Precautions: Fall;Knee;Other (comment) Precaution Comments: NO ROM at knee at this time Required Braces or Orthoses: Knee Immobilizer - Right Knee Immobilizer - Right: On at all times Restrictions RLE Weight Bearing: Touchdown weight bearing     Mobility  Bed Mobility Overal bed mobility: Needs Assistance Bed Mobility: Supine to Sit     Supine to sit: Supervision     General bed mobility comments: no physical assist    Transfers Overall transfer level: Needs assistance Equipment used: Rolling walker (2 wheels) Transfers: Sit to/from Stand Sit to Stand: Supervision           General transfer comment: cues for LE management and use of UEs to self assist    Ambulation/Gait Ambulation/Gait assistance: Supervision Gait Distance (Feet): 85 Feet Assistive device: Rolling walker (2 wheels) Gait  Pattern/deviations: Step-to pattern;Decreased step length - right;Decreased step length - left;Trunk flexed;Shuffle Gait velocity: decr     General Gait Details: cues for posture, position from RW and TDWB on R   Stairs Stairs: Yes Stairs assistance: Min assist Stair Management: No rails;Step to pattern;Backwards;With walker Number of Stairs: 2 General stair comments: cues for sequence and foot/RW placement   Wheelchair Mobility    Modified Rankin (Stroke Patients Only)       Balance Overall balance assessment: Mild deficits observed, not formally tested                                          Cognition Arousal/Alertness: Awake/alert Behavior During Therapy: WFL for tasks assessed/performed Overall Cognitive Status: Within Functional Limits for tasks assessed                                          Exercises Total Joint Exercises Ankle Circles/Pumps: AROM;Both;15 reps;Supine Quad Sets: AROM;Both;10 reps;Supine Hip ABduction/ADduction: AAROM;Right;15 reps;Supine Straight Leg Raises: AAROM;AROM;Right;15 reps;Supine    General Comments        Pertinent Vitals/Pain Pain Assessment: 0-10 Pain Score: 2  Pain Location: R knee Pain Descriptors / Indicators: Aching;Sore Pain Intervention(s): Limited activity within patient's tolerance;Monitored during session;Ice applied    Home Living                          Prior  Function            PT Goals (current goals can now be found in the care plan section) Acute Rehab PT Goals Patient Stated Goal: Regain IND PT Goal Formulation: With patient Time For Goal Achievement: 09/30/21 Potential to Achieve Goals: Good Progress towards PT goals: Progressing toward goals    Frequency    7X/week      PT Plan Current plan remains appropriate    Co-evaluation              AM-PAC PT "6 Clicks" Mobility   Outcome Measure  Help needed turning from your back to your  side while in a flat bed without using bedrails?: A Little Help needed moving from lying on your back to sitting on the side of a flat bed without using bedrails?: A Little Help needed moving to and from a bed to a chair (including a wheelchair)?: A Little Help needed standing up from a chair using your arms (e.g., wheelchair or bedside chair)?: A Little Help needed to walk in hospital room?: A Little Help needed climbing 3-5 steps with a railing? : A Little 6 Click Score: 18    End of Session Equipment Utilized During Treatment: Gait belt;Right knee immobilizer Activity Tolerance: Patient tolerated treatment well Patient left: with call bell/phone within reach;in chair;with chair alarm set Nurse Communication: Mobility status PT Visit Diagnosis: Unsteadiness on feet (R26.81);Difficulty in walking, not elsewhere classified (R26.2)     Time: 3291-9166 PT Time Calculation (min) (ACUTE ONLY): 27 min  Charges:  $Gait Training: 8-22 mins $Therapeutic Exercise: 8-22 mins                     [place] Pager [phone] Office [phone]    [place] Cantero 09/25/2021, 1:00 PM

## 2021-09-25 NOTE — Progress Notes (Signed)
Subjective:  Patient reports pain as mild to moderate.  Denies N/V/CP/SOB. No c/o. Wants to go home.  Objective:   VITALS:   Vitals:   09/23/21 2150 09/24/21 1405 09/24/21 2121 09/25/21 0551  BP: 128/60 (!) 111/52 125/63 126/78  Pulse: 66 81 77 77  Resp: _0 Temp: 99.2 F (37.3 C) 98.4 F (36.9 C) 98.4 F (36.9 C) 98 F (36.7 C)  TempSrc: Oral  Oral Oral  SpO2: 97% 97% 95% 98%  Weight:      Height:        NAD ABD soft Sensation intact distally Intact pulses distally Dorsiflexion/Plantar flexion intact iVAC intact w/o leak HV scant ss  Lab Results  Component Value Date   WBC 8.1 09/25/2021   HGB 9.5 (L) 09/25/2021   HCT 28.9 (L) 09/25/2021   MCV 86.8 09/25/2021   PLT 173 09/25/2021   BMET    Component Value Date/Time   NA 138 09/23/2021 0347   NA 139 04/07/2021 0815   K 4.8 09/23/2021 0347   CL 105 09/23/2021 0347   CO2 29 09/23/2021 0347   GLUCOSE 153 (H) 09/23/2021 0347   BUN 14 09/23/2021 0347   BUN 13 04/07/2021 0815   CREATININE 1.08 09/23/2021 0347   CREATININE 1.02 06/08/2016 1041   CALCIUM 8.5 (L) 09/23/2021 0347   EGFR 91 04/07/2021 0815   GFRNONAA >60 09/23/2021 0347   Recent Results (from the past 240 hour(s))  SARS Coronavirus 2 (TAT 6-24 hrs)     Status: None   Collection Time: 09/20/21 12:00 AM  Result Value Ref Range Status   SARS Coronavirus 2 RESULT: NEGATIVE  Final    Comment: RESULT: NEGATIVESARS-CoV-2 INTERPRETATION:A NEGATIVE  test result means that SARS-CoV-2 RNA was not present in the specimen above the limit of detection of this test. This does not preclude a possible SARS-CoV-2 infection and should not be used as the  sole basis for patient management decisions. Negative results must be combined with clinical observations, patient history, and epidemiological information. Optimum specimen types and timing for peak viral levels during infections caused by SARS-CoV-2  have not been determined. Collection of multiple  specimens or types of specimens may be necessary to detect virus. Improper specimen collection and handling, sequence variability under primers/probes, or organism present below the limit of detection may  lead to false negative results. Positive and negative predictive values of testing are highly dependent on prevalence. False negative test results are more likely when prevalence of disease is high.The expected result is NEGATIVE.Fact S heet for  Healthcare Providers: LocalChronicle.no Sheet for Patients: SalonLookup.es Reference Range - Negative   Aerobic/Anaerobic Culture w Gram Stain (surgical/deep wound)     Status: None (Preliminary result)   Collection Time: 09/22/21  6:11 PM   Specimen: Synovial, Right Knee; Body Fluid  Result Value Ref Range Status   Specimen Description   Final    KNEE RIGHT KNEES SYNOVIAL Performed at [facility] [address] [place], [place] [address]    Special Requests   Final    NONE Performed at [facility], [place] [address] [place], [place] [address]    Gram Stain   Final    FEW WBC PRESENT, PREDOMINANTLY MONONUCLEAR RARE GRAM POSITIVE COCCI    Culture   Final    NO GROWTH 2 DAYS NO ANAEROBES ISOLATED; CULTURE IN PROGRESS FOR 5 DAYS Performed at [facility], [place] [address] [place] [address], [place], [place]  [address]    Report Status PENDING  Incomplete     Assessment/Plan: 3 Days Post-Op   Principal Problem:   Infection of total knee replacement (HCC) Active Problems:   Failed orthopedic implant, initial encounter ([place])   TDWB with walker, KI while OOB DVT ppx: Xarelto, SCDs, TEDS PO pain control PT/OT Dispo: D/C home today after clears PT, fu this Friday for [facility] removal    Patrick Fritz 09/25/2021, 9:36 AM   Rod Can, MD [phone] [facility] now [facility]  [place] [address], [address] [place] [address],  [place],  [address] Phone: [phone] [email or web address] Facebook  [employer]

## 2021-09-25 NOTE — Progress Notes (Signed)
Patient discharging with provina(portable wound vac) intact. Working properly and patient sent with box/charger/instructions.

## 2021-09-25 NOTE — Plan of Care (Signed)
  Problem: Education: Goal: Knowledge of General Education information will improve Description: Including pain rating scale, medication(s)/side effects and non-pharmacologic comfort measures Outcome: Progressing   Problem: Activity: Goal: Risk for activity intolerance will decrease Outcome: Progressing   Problem: Pain Managment: Goal: General experience of comfort will improve Outcome: Progressing   

## 2021-09-26 ENCOUNTER — Other Ambulatory Visit (HOSPITAL_COMMUNITY): Payer: Self-pay

## 2021-09-26 DIAGNOSIS — M25561 Pain in right knee: Secondary | ICD-10-CM | POA: Diagnosis not present

## 2021-09-27 LAB — AEROBIC/ANAEROBIC CULTURE W GRAM STAIN (SURGICAL/DEEP WOUND): Culture: NO GROWTH

## 2021-09-28 DIAGNOSIS — M009 Pyogenic arthritis, unspecified: Secondary | ICD-10-CM | POA: Diagnosis not present

## 2021-09-28 DIAGNOSIS — T8459XA Infection and inflammatory reaction due to other internal joint prosthesis, initial encounter: Secondary | ICD-10-CM | POA: Diagnosis not present

## 2021-09-28 DIAGNOSIS — Z96659 Presence of unspecified artificial knee joint: Secondary | ICD-10-CM | POA: Diagnosis not present

## 2021-09-29 ENCOUNTER — Encounter (HOSPITAL_COMMUNITY): Payer: Self-pay | Admitting: Orthopedic Surgery

## 2021-10-03 ENCOUNTER — Other Ambulatory Visit: Payer: Medicare Other

## 2021-10-03 ENCOUNTER — Other Ambulatory Visit: Payer: Self-pay

## 2021-10-03 ENCOUNTER — Ambulatory Visit (HOSPITAL_COMMUNITY): Payer: Medicare Other | Attending: Interventional Cardiology

## 2021-10-03 DIAGNOSIS — I5022 Chronic systolic (congestive) heart failure: Secondary | ICD-10-CM

## 2021-10-03 LAB — BASIC METABOLIC PANEL
BUN/Creatinine Ratio: 18 (ref 10–24)
BUN: 16 mg/dL (ref 8–27)
CO2: 24 mmol/L (ref 20–29)
Calcium: 9 mg/dL (ref 8.6–10.2)
Chloride: 101 mmol/L (ref 96–106)
Creatinine, Ser: 0.88 mg/dL (ref 0.76–1.27)
Glucose: 95 mg/dL (ref 70–99)
Potassium: 4.8 mmol/L (ref 3.5–5.2)
Sodium: 137 mmol/L (ref 134–144)
eGFR: 91 mL/min/{1.73_m2} (ref >59)

## 2021-10-03 LAB — ECHOCARDIOGRAM COMPLETE
Area-P 1/2: 5.42 cm2
P 1/2 time: 385 msec
S' Lateral: 3.7 cm

## 2021-10-06 DIAGNOSIS — M25561 Pain in right knee: Secondary | ICD-10-CM | POA: Diagnosis not present

## 2021-10-11 NOTE — Discharge Summary (Signed)
Physician Discharge Summary  Patient ID: Patrick Fritz. MRN: [id number] DOB/AGE: 72/06/1951 72 y.o.  Admit date: 09/22/2021 Discharge date: 09/25/2021  Admission Diagnoses:  Infection of total knee replacement [facility])  Discharge Diagnoses:  Principal Problem:   Infection of total knee replacement ([place]) Active Problems:   Failed orthopedic implant, initial encounter [facility])   Past Medical History:  Diagnosis Date   Cancer ([place])    skin   DJD (degenerative joint disease), lumbar    DVT (deep venous thrombosis) ([place])    Dysrhythmia    Family history of adverse reaction to anesthesia    Glaucoma    R eye diminished vision approx 50%   High cholesterol    Hyperlipidemia    Hypertension    IFG (impaired fasting glucose)    OSA (obstructive sleep apnea)    Severe w AHI 34/hr on HST no on BiPAP at 19/15cm H2O   PE (pulmonary embolism)    Second occurance,enknown etiology,lifelong anticoagulation    Surgeries: Procedure(s): RIGHT KNEE REMOVAL SPACER TOTAL KNEE REVISION on 09/22/2021   Consultants (if any):   Discharged Condition: Improved  Hospital Course: Patrick Bradwell. is an 70 y.o. male who was admitted 09/22/2021 with a diagnosis of Infection of total knee replacement ([place]) and went to the operating room on 09/22/2021 and underwent the above named procedures.    He was given perioperative antibiotics:  Anti-infectives (From admission, onward)    Start     Dose/Rate Route Frequency Ordered Stop   09/25/21 0000  doxycycline (VIBRAMYCIN) 50 MG capsule        50 mg Oral 2 times daily 09/25/21 0945     09/23/21 0030  vancomycin (VANCOCIN) IVPB 1000 mg/200 mL premix        1,000 mg 200 mL/hr over 60 Minutes Intravenous Every 12 hours 09/22/21 2104 09/23/21 0105   09/22/21 2200  cefTRIAXone (ROCEPHIN) 2 g in sodium chloride 0.9 % 100 mL IVPB        2 g 200 mL/hr over 30 Minutes Intravenous Every 24 hours 09/22/21 2104 09/24/21 2210   09/22/21 1145  ceFAZolin (ANCEF) IVPB  2g/100 mL premix        2 g 200 mL/hr over 30 Minutes Intravenous On call to O.R. 09/22/21 1139 09/22/21 1904     .  He was given sequential compression devices, early ambulation with touch down weight bearing, and apixaban for DVT prophylaxis.  He benefited maximally from the hospital stay and there were no complications.    Recent vital signs:  Vitals:   09/24/21 2121 09/25/21 0551  BP: 125/63 126/78  Pulse: 77 77  Resp: 16 16  Temp: 98.4 F (36.9 C) 98 F (36.7 C)  SpO2: 95% 98%    Recent laboratory studies:  Lab Results  Component Value Date   HGB 9.5 (L) 09/25/2021   HGB 9.2 (L) 09/24/2021   HGB 10.3 (L) 09/23/2021   Lab Results  Component Value Date   WBC 8.1 09/25/2021   PLT 173 09/25/2021   Lab Results  Component Value Date   INR 2.1 (H) 09/13/2021   Lab Results  Component Value Date   NA 137 10/03/2021   K 4.8 10/03/2021   CL 101 10/03/2021   CO2 24 10/03/2021   BUN 16 10/03/2021   CREATININE 0.88 10/03/2021   GLUCOSE 95 10/03/2021     WEIGHT BEARING   Partial weight bearing with assist device as directed.  Touch down weight bearing right lower extremity  EXERCISES  Results after joint replacement surgery are often greatly improved when you follow the exercise, range of motion and muscle strengthening exercises prescribed by your doctor. Safety measures are also important to protect the joint from further injury. Any time any of these exercises cause you to have increased pain or swelling, decrease what you are doing until you are comfortable again and then slowly increase them. If you have problems or questions, call your caregiver or physical therapist for advice.   Rehabilitation is important following a joint replacement. After just a few days of immobilization, the muscles of the leg can become weakened and shrink (atrophy).  These exercises are designed to build up the tone and strength of the thigh and leg muscles and to improve motion.  Often times heat used for twenty to thirty minutes before working out will loosen up your tissues and help with improving the range of motion but do not use heat for the first two weeks following surgery (sometimes heat can increase post-operative swelling).   These exercises can be done on a training (exercise) mat, on the floor, on a table or on a bed. Use whatever works the best and is most comfortable for you.    Use music or television while you are exercising so that the exercises are a pleasant break in your day. This will make your life better with the exercises acting as a break in your routine that you can look forward to.   Perform all exercises about fifteen times, three times per day or as directed.  You should exercise both the operative leg and the other leg as well.  Exercises include:   Quad Sets - Tighten up the muscle on the front of the thigh (Quad) and hold for 5-10 seconds.   Straight Leg Raises - With your knee straight (if you were given a brace, keep it on), lift the leg to 60 degrees, hold for 3 seconds, and slowly lower the leg.  Perform this exercise against resistance later as your leg gets stronger.  Leg Slides: Lying on your back, slowly slide your foot toward your buttocks, bending your knee up off the floor (only go as far as is comfortable). Then slowly slide your foot back down until your leg is flat on the floor again.  Angel Wings: Lying on your back spread your legs to the side as far apart as you can without causing discomfort.  Hamstring Strength:  Lying on your back, push your heel against the floor with your leg straight by tightening up the muscles of your buttocks.  Repeat, but this time bend your knee to a comfortable angle, and push your heel against the floor.  You may put a pillow under the heel to make it more comfortable if necessary.   A rehabilitation program following joint replacement surgery can speed recovery and prevent re-injury in the future due  to weakened muscles. Contact your doctor or a physical therapist for more information on knee rehabilitation.    CONSTIPATION  Constipation is defined medically as fewer than three stools per week and severe constipation as less than one stool per week.  Even if you have a regular bowel pattern at home, your normal regimen is likely to be disrupted due to multiple reasons following surgery.  Combination of anesthesia, postoperative narcotics, change in appetite and fluid intake all can affect your bowels.   YOU MUST use at least one of the following options; they are listed in order of  increasing strength to get the job done.  They are all available over the counter, and you may need to use some, POSSIBLY even all of these options:    Drink plenty of fluids (prune juice may be helpful) and high fiber foods Colace 100 mg by mouth twice a day  Senokot for constipation as directed and as needed Dulcolax (bisacodyl), take with full glass of water  Miralax (polyethylene glycol) once or twice a day as needed.  If you have tried all these things and are unable to have a bowel movement in the first 3-4 days after surgery call either your surgeon or your primary doctor.    If you experience loose stools or diarrhea, hold the medications until you stool forms back up.  If your symptoms do not get better within 1 week or if they get worse, check with your doctor.  If you experience "the worst abdominal pain ever" or develop nausea or vomiting, please contact the office immediately for further recommendations for treatment.   ITCHING:  If you experience itching with your medications, try taking only a single pain pill, or even half a pain pill at a time.  You can also use Benadryl over the counter for itching or also to help with sleep.   TED HOSE STOCKINGS:  Use stockings on both legs until for at least 2 weeks or as directed by physician office. They may be removed at night for sleeping.  MEDICATIONS:   See your medication summary on the After Visit Summary that nursing will review with you.  You may have some home medications which will be placed on hold until you complete the course of blood thinner medication.  It is important for you to complete the blood thinner medication as prescribed.  PRECAUTIONS:  If you experience chest pain or shortness of breath - call 911 immediately for transfer to the hospital emergency department.   If you develop a fever greater that 101 F, purulent drainage from wound, increased redness or drainage from wound, foul odor from the wound/dressing, or calf pain - CONTACT YOUR SURGEON.                                                   FOLLOW-UP APPOINTMENTS:  If you do not already have a post-op appointment, please call the office for an appointment to be seen by your surgeon.  Guidelines for how soon to be seen are listed in your After Visit Summary, but are typically between 1-4 weeks after surgery.  OTHER INSTRUCTIONS:   Knee Replacement:  Do not place pillow under knee, focus on keeping the knee straight while resting. CPM instructions: 0-90 degrees, 2 hours in the morning, 2 hours in the afternoon, and 2 hours in the evening. Place foam block, curve side up under heel at all times except when in CPM or when walking.  DO NOT modify, tear, cut, or change the foam block in any way.   MAKE SURE YOU:  Understand these instructions.  Get help right away if you are not doing well or get worse.    Thank you for letting us be a part of your medical care team.  It is a privilege we respect greatly.  We hope these instructions will help you stay on track for a fast and full recovery!   Diagnostic Studies:  DG Knee Right Port  Result Date: 09/22/2021 CLINICAL DATA:  Postop EXAM: PORTABLE RIGHT KNEE - 1-2 VIEW COMPARISON:  05/26/2021 FINDINGS: Revision of right knee replacement with intact hardware and normal alignment. No fracture is seen. Gas in the soft tissues  consistent with recent surgery. Coarse calcification posterior to the knee without change. Surgical drain over the suprapatellar soft tissues IMPRESSION: Right knee replacement with expected postsurgical change. Electronically Signed   By: Donavan Foil M.D.   On: 09/22/2021 20:49   ECHOCARDIOGRAM COMPLETE  Result Date: 10/03/2021    ECHOCARDIOGRAM REPORT   Patient Name:   Patrick Fritz. Date of Exam: 10/03/2021 Medical Rec #:  [id number]         Height:       70.0 in Accession #:    [id number]        Weight:       222.9 lb Date of Birth:  1951-03-27         BSA:          2.186 m Patient Age:    47 years          BP:           130/76 mmHg Patient Gender: M                 HR:           67 bpm. Exam Location:  [facility] Procedure: 2D Echo, Cardiac Doppler and Color Doppler Indications:    I50.9* Heart failure (unspecified)  History:        Patient has prior history of Echocardiogram examinations, most                 recent 01/22/2021. Previous Myocardial Infarction and CAD, Prior                 CABG, TIA, Signs/Symptoms:Chest Pain and Shortness of Breath;                 Risk Factors:Hypertension and Dyslipidemia. Aortic                 atherosclerosis. Obstructive sleep apnea. Obesity. History of                 ascending aorta replacement.  Sonographer:    Diamond Nickel RCS Referring Phys: [place]  1. Left ventricular ejection fraction, by estimation, is 50 to 55%. The left ventricle has low normal function. The left ventricle demonstrates regional wall motion abnormalities. The basal-to-mid inferior wall is severely hypokinetic. There is mild concentric left ventricular hypertrophy. Left ventricular diastolic parameters are consistent with Grade II diastolic dysfunction (pseudonormalization).  2. Right ventricular systolic function is normal. The right ventricular size is normal.  3. The mitral valve is normal in structure. Trivial mitral valve regurgitation.  4. The aortic valve  is tricuspid. There is mild calcification of the aortic valve. There is mild thickening of the aortic valve. Aortic valve regurgitation is mild. Aortic valve sclerosis/calcification is present, without any evidence of aortic stenosis.  5. Left atrial size was mildly dilated. Comparison(s): Compared to prior TTE on 01/22/2021, the LVEF has improved from 30-35% to 50-55% and wall motion has improved. FINDINGS  Left Ventricle: Left ventricular ejection fraction, by estimation, is 50 to 55%. The left ventricle has low normal function. The left ventricle demonstrates regional wall motion abnormalities. The basal-to-mid inferior wall is hypokinetic. The left ventricular internal cavity size was normal in size. There  is mild concentric left ventricular hypertrophy. Abnormal (paradoxical) septal motion consistent with post-operative status. Left ventricular diastolic parameters are consistent with Grade II diastolic dysfunction (pseudonormalization). Right Ventricle: The right ventricular size is normal. No increase in right ventricular wall thickness. Right ventricular systolic function is normal. Left Atrium: Left atrial size was mildly dilated. Right Atrium: Right atrial size was normal in size. Pericardium: There is no evidence of pericardial effusion. Mitral Valve: The mitral valve is normal in structure. There is mild thickening of the mitral valve leaflet(s). Mild mitral annular calcification. Trivial mitral valve regurgitation. Tricuspid Valve: The tricuspid valve is normal in structure. Tricuspid valve regurgitation is mild. Aortic Valve: The aortic valve is tricuspid. There is mild calcification of the aortic valve. There is mild thickening of the aortic valve. Aortic valve regurgitation is mild. Aortic regurgitation PHT measures 385 msec. Aortic valve sclerosis/calcification is present, without any evidence of aortic stenosis. Pulmonic Valve: The pulmonic valve was normal in structure. Pulmonic valve regurgitation  is trivial. Aorta: The aortic root and ascending aorta are structurally normal, with no evidence of dilitation. Venous: The inferior vena cava was not well visualized. IAS/Shunts: The atrial septum is grossly normal.  LEFT VENTRICLE PLAX 2D LVIDd:         5.30 cm   Diastology LVIDs:         3.70 cm   LV e' medial:    6.81 cm/s LV PW:         1.10 cm   LV E/e' medial:  15.3 LV IVS:        1.20 cm   LV e' lateral:   18.20 cm/s LVOT diam:     2.05 cm   LV E/e' lateral: 5.7 LV SV:         63 LV SV Index:   29 LVOT Area:     3.30 cm  RIGHT VENTRICLE RV Basal diam:  3.30 cm RV S prime:     10.60 cm/s TAPSE (M-mode): 1.0 cm RVSP:           25.8 mmHg LEFT ATRIUM             Index        RIGHT ATRIUM           Index LA diam:        5.60 cm 2.56 cm/m   RA Pressure: 3.00 mmHg LA Vol (A2C):   73.8 ml 33.77 ml/m  RA Area:     22.80 cm LA Vol (A4C):   71.1 ml 32.53 ml/m  RA Volume:   68.50 ml  31.34 ml/m LA Biplane Vol: 75.9 ml 34.73 ml/m  AORTIC VALVE LVOT Vmax:   87.80 cm/s LVOT Vmean:  59.900 cm/s LVOT VTI:    0.191 m AI PHT:      385 msec  AORTA Ao Root diam: 3.50 cm Ao Asc diam:  2.60 cm MITRAL VALVE                TRICUSPID VALVE MV Area (PHT): 5.42 cm     TR Peak grad:   22.8 mmHg MV Decel Time: 140 msec     TR Vmax:        239.00 cm/s MV E velocity: 104.00 cm/s  Estimated RAP:  3.00 mmHg MV A velocity: 60.30 cm/s   RVSP:           25.8 mmHg MV E/A ratio:  1.72  SHUNTS                             Systemic VTI:  0.19 m                             Systemic Diam: 2.05 cm Gwyndolyn Kaufman MD Electronically signed by Gwyndolyn Kaufman MD Signature Date/Time: 10/03/2021/2:41:57 PM    Final     Disposition: Discharge disposition: 01-Home or Self Care       Discharge Instructions     Call MD / Call 911   Complete by: As directed    If you experience chest pain or shortness of breath, CALL 911 and be transported to the hospital emergency room.  If you develope a fever above 101 F,  pus (white drainage) or increased drainage or redness at the wound, or calf pain, call your surgeon's office.   Constipation Prevention   Complete by: As directed    Drink plenty of fluids.  Prune juice may be helpful.  You may use a stool softener, such as Colace (over the counter) 100 mg twice a day.  Use MiraLax (over the counter) for constipation as needed.   Diet - low sodium heart healthy   Complete by: As directed    Discharge instructions   Complete by: As directed    Wear knee immobilizer while out of bed Charge VAC unit every night Do NOT get dressing wet Touch down weight bearing with a walker   Do not put a pillow under the knee. Place it under the heel.   Complete by: As directed    Driving restrictions   Complete by: As directed    No driving for 12 weeks   Increase activity slowly as tolerated   Complete by: As directed    Lifting restrictions   Complete by: As directed    No lifting for 12 weeks   Post-operative opioid taper instructions:   Complete by: As directed    POST-OPERATIVE OPIOID TAPER INSTRUCTIONS: It is important to wean off of your opioid medication as soon as possible. If you do not need pain medication after your surgery it is ok to stop day one. Opioids include: Codeine, Hydrocodone(Norco, Vicodin), Oxycodone(Percocet, oxycontin) and hydromorphone amongst others.  Long term and even short term use of opiods can cause: Increased pain response Dependence Constipation Depression Respiratory depression And more.  Withdrawal symptoms can include Flu like symptoms Nausea, vomiting And more Techniques to manage these symptoms Hydrate well Eat regular healthy meals Stay active Use relaxation techniques(deep breathing, meditating, yoga) Do Not substitute Alcohol to help with tapering If you have been on opioids for less than two weeks and do not have pain than it is ok to stop all together.  Plan to wean off of opioids This plan should start  within one week post op of your joint replacement. Maintain the same interval or time between taking each dose and first decrease the dose.  Cut the total daily intake of opioids by one tablet each day Next start to increase the time between doses. The last dose that should be eliminated is the evening dose.           Follow-up Information     Swinteck, Aaron Edelman, MD. Schedule an appointment as soon as possible for a visit on 09/30/2021.   Specialty: Orthopedic Surgery Why: VAC removal Contact information: [address]  [place] [address] [phone]                  Signed: Dorothyann Peng 10/11/2021, 10:22 AM

## 2021-10-14 DIAGNOSIS — M25561 Pain in right knee: Secondary | ICD-10-CM | POA: Diagnosis not present

## 2021-10-18 ENCOUNTER — Ambulatory Visit: Payer: Medicare Other | Admitting: Internal Medicine

## 2021-10-20 DIAGNOSIS — M25561 Pain in right knee: Secondary | ICD-10-CM | POA: Diagnosis not present

## 2021-10-23 NOTE — Progress Notes (Deleted)
Cardiology Office Note:    Date:  10/23/2021   ID:  Patrick Fritz., DOB [birth year], MRN [id number]  PCP:  Mayra Neer, MD  Cardiologist:  Sinclair Grooms, MD   Referring MD: Mayra Neer, MD   No chief complaint on file.   History of Present Illness:    Patrick Fritz. is a 71 y.o. 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   ***  Past Medical History:  Diagnosis Date   Cancer ([place])    skin   DJD (degenerative joint disease), lumbar    DVT (deep venous thrombosis) (HCC)    Dysrhythmia    Family history of adverse reaction to anesthesia    Glaucoma    R eye diminished vision approx 50%   High cholesterol    Hyperlipidemia    Hypertension    IFG (impaired fasting glucose)    OSA (obstructive sleep apnea)    Severe w AHI 34/hr on HST no on BiPAP at 19/15cm H2O   PE (pulmonary embolism)    Second occurance,enknown etiology,lifelong anticoagulation    Past Surgical History:  Procedure Laterality Date   CARDIAC CATHETERIZATION N/A 05/14/2016   Procedure: Left Heart Cath and Coronary Angiography;  Surgeon: Belva Crome, MD;  Location: [facility];  Service: Cardiovascular;  Laterality: N/A;   CORONARY ARTERY BYPASS GRAFT N/A 05/14/2016   Procedure: CORONARY ARTERY BYPASS GRAFTING (CABG) times two with LIMA to LAD and right leg SVG to PDA,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;  Surgeon: Ivin Poot, MD;  Location: [facility];  Service: Open Heart Surgery;  Laterality: N/A;   EXCISIONAL TOTAL KNEE ARTHROPLASTY WITH ANTIBIOTIC SPACERS Right 05/26/2021   Procedure: EXCISIONAL TOTAL KNEE ARTHROPLASTY WITH ANTIBIOTIC SPACERS;  Surgeon: Rod Can, MD;  Location: [facility];   Service: Orthopedics;  Laterality: Right;   KNEE SURGERY Bilateral    arthroscopic   TOTAL KNEE ARTHROPLASTY Bilateral 1996   TOTAL KNEE REVISION Right 09/22/2021   Procedure: RIGHT KNEE REMOVAL SPACER TOTAL KNEE REVISION;  Surgeon: Rod Can, MD;  Location: [facility];  Service: Orthopedics;  Laterality: Right;  210    Current Medications: No outpatient medications have been marked as taking for the 10/25/21 encounter (Appointment) with Belva Crome, MD.     Allergies:   Patient has no known allergies.   Social History   Socioeconomic History   Marital status: Married    Spouse name: Not on file   Number of children: Not on file   Years of education: Not on file   Highest education level: Not on file  Occupational History   Not on file  Tobacco Use   Smoking status: Never   Smokeless tobacco: Never  Vaping Use   Vaping Use: Never used  Substance and Sexual Activity   Alcohol use: Yes    Comment: 1-2 per week   Drug use: Never   Sexual activity: Not on file  Other Topics Concern   Not on file  Social History Narrative   Not on file   Social Determinants of Health   Financial Resource Strain: Not on file  Food Insecurity: Not on file  Transportation Needs: Not on file  Physical Activity: Not on file  Stress: Not on file  Social Connections: Not on file     Family History: The patient's family history includes CAD in his father; Hypertension in his father; Osteoporosis in his mother.  ROS:   Please see the history of present illness.    *** All other systems reviewed and are negative.  EKGs/Labs/Other Studies Reviewed:    The following studies were reviewed today: 2 D Doppler ECHO 09/2021: IMPRESSIONS   1. Left ventricular ejection fraction, by estimation, is 50 to 55%. The  left ventricle has low normal function. The left ventricle demonstrates  regional wall motion abnormalities. The basal-to-mid inferior wall is  severely hypokinetic. There is mild   concentric left ventricular hypertrophy. Left ventricular diastolic  parameters are consistent with Grade II diastolic dysfunction  (pseudonormalization).   2. Right ventricular systolic function is normal. The right ventricular  size is normal.   3. The mitral valve is normal in structure. Trivial mitral valve  regurgitation.   4. The aortic valve is tricuspid. There is mild calcification of the  aortic valve. There is mild thickening of the aortic valve. Aortic valve  regurgitation is mild. Aortic valve sclerosis/calcification is present,  without any evidence of aortic  stenosis.   5. Left atrial size was mildly dilated.   Comparison(s): Compared to prior TTE on 01/22/2021, the LVEF has improved  from 30-35% to 50-55% and wall motion has improved.   EKG:  EKG ***  Recent Labs: 01/22/2021: TSH 2.532 05/25/2021: Magnesium 2.1 09/13/2021: ALT 11 09/25/2021: Hemoglobin 9.5; Platelets 173 10/03/2021: BUN 16; Creatinine, Ser 0.88; Potassium 4.8; Sodium 137  Recent Lipid Panel    Component Value Date/Time   CHOL 184 01/22/2021 0430   CHOL 165 10/21/2020 0932   TRIG 472 (H) 01/22/2021 0430   HDL 34 (L) 01/22/2021 0430   HDL 49 10/21/2020 0932   CHOLHDL 5.4 01/22/2021 0430   VLDL UNABLE TO CALCULATE IF TRIGLYCERIDE OVER 400 mg/dL 01/22/2021 0430   LDLCALC UNABLE TO CALCULATE IF TRIGLYCERIDE OVER 400 mg/dL 01/22/2021 0430   LDLCALC 97 10/21/2020 0932   LDLDIRECT 55.6 01/22/2021 0430    Physical Exam:    VS:  There were no vitals taken for this visit.    Wt Readings from Last 3 Encounters:  09/22/21 222 lb 14.2 oz (101.1 kg)  09/13/21 222 lb 14.4 oz (101.1 kg)  07/13/21 218 lb (98.9 kg)     GEN: ***. No acute distress HEENT: Normal NECK: No JVD. LYMPHATICS: No lymphadenopathy CARDIAC: *** murmur. RRR *** gallop, or edema. VASCULAR: *** Normal Pulses. No bruits. RESPIRATORY:  Clear to auscultation without rales, wheezing or rhonchi  ABDOMEN: Soft, non-tender,  non-distended, No pulsatile mass, MUSCULOSKELETAL: No deformity  SKIN: Warm and dry NEUROLOGIC:  Alert and oriented x 3 PSYCHIATRIC:  Normal affect   ASSESSMENT:    1. Chronic systolic heart failure ([place])   2. S/P CABG x 2   3. Bilateral pulmonary embolism ([place])   4. Hyperlipidemia LDL goal <70   5. Chronic anticoagulation   6. Obstructive sleep apnea    PLAN:    In order of problems listed above:  ***   Medication Adjustments/Labs and Tests Ordered: Current medicines are reviewed at length with the patient today.  Concerns regarding medicines are outlined above.  No orders of the defined types were placed in this encounter.  No orders of the defined types were placed in this encounter.   There are no Patient Instructions on file for this visit.  Signed, Sinclair Grooms, MD  10/23/2021 3:09 PM    [facility]

## 2021-10-25 ENCOUNTER — Ambulatory Visit: Payer: Medicare Other | Admitting: Interventional Cardiology

## 2021-10-25 DIAGNOSIS — E785 Hyperlipidemia, unspecified: Secondary | ICD-10-CM

## 2021-10-25 DIAGNOSIS — Z951 Presence of aortocoronary bypass graft: Secondary | ICD-10-CM

## 2021-10-25 DIAGNOSIS — G4733 Obstructive sleep apnea (adult) (pediatric): Secondary | ICD-10-CM

## 2021-10-25 DIAGNOSIS — I2699 Other pulmonary embolism without acute cor pulmonale: Secondary | ICD-10-CM

## 2021-10-25 DIAGNOSIS — Z7901 Long term (current) use of anticoagulants: Secondary | ICD-10-CM

## 2021-10-25 DIAGNOSIS — I5022 Chronic systolic (congestive) heart failure: Secondary | ICD-10-CM

## 2021-10-29 DIAGNOSIS — Z96659 Presence of unspecified artificial knee joint: Secondary | ICD-10-CM | POA: Diagnosis not present

## 2021-10-29 DIAGNOSIS — M009 Pyogenic arthritis, unspecified: Secondary | ICD-10-CM | POA: Diagnosis not present

## 2021-10-29 DIAGNOSIS — T8459XA Infection and inflammatory reaction due to other internal joint prosthesis, initial encounter: Secondary | ICD-10-CM | POA: Diagnosis not present

## 2021-10-31 DIAGNOSIS — Z4789 Encounter for other orthopedic aftercare: Secondary | ICD-10-CM | POA: Diagnosis not present

## 2021-11-29 DIAGNOSIS — Z96659 Presence of unspecified artificial knee joint: Secondary | ICD-10-CM | POA: Diagnosis not present

## 2021-11-29 DIAGNOSIS — T8459XA Infection and inflammatory reaction due to other internal joint prosthesis, initial encounter: Secondary | ICD-10-CM | POA: Diagnosis not present

## 2021-11-29 DIAGNOSIS — M009 Pyogenic arthritis, unspecified: Secondary | ICD-10-CM | POA: Diagnosis not present

## 2021-12-27 DIAGNOSIS — M009 Pyogenic arthritis, unspecified: Secondary | ICD-10-CM | POA: Diagnosis not present

## 2021-12-27 DIAGNOSIS — T8459XA Infection and inflammatory reaction due to other internal joint prosthesis, initial encounter: Secondary | ICD-10-CM | POA: Diagnosis not present

## 2021-12-27 DIAGNOSIS — Z96659 Presence of unspecified artificial knee joint: Secondary | ICD-10-CM | POA: Diagnosis not present

## 2021-12-29 ENCOUNTER — Inpatient Hospital Stay (HOSPITAL_COMMUNITY)
Admission: EM | Admit: 2021-12-29 | Discharge: 2021-12-31 | DRG: 101 | Disposition: A | Discharge status: Home Health | Payer: Medicare Other | Attending: Internal Medicine | Admitting: Internal Medicine

## 2021-12-29 ENCOUNTER — Other Ambulatory Visit: Payer: Self-pay

## 2021-12-29 ENCOUNTER — Observation Stay (HOSPITAL_COMMUNITY): Payer: Medicare Other

## 2021-12-29 ENCOUNTER — Emergency Department (HOSPITAL_COMMUNITY): Payer: Medicare Other

## 2021-12-29 ENCOUNTER — Encounter (HOSPITAL_COMMUNITY): Payer: Self-pay | Admitting: Internal Medicine

## 2021-12-29 DIAGNOSIS — T8459XD Infection and inflammatory reaction due to other internal joint prosthesis, subsequent encounter: Secondary | ICD-10-CM | POA: Diagnosis not present

## 2021-12-29 DIAGNOSIS — Z96653 Presence of artificial knee joint, bilateral: Secondary | ICD-10-CM | POA: Diagnosis present

## 2021-12-29 DIAGNOSIS — H409 Unspecified glaucoma: Secondary | ICD-10-CM | POA: Diagnosis present

## 2021-12-29 DIAGNOSIS — G4733 Obstructive sleep apnea (adult) (pediatric): Secondary | ICD-10-CM | POA: Diagnosis not present

## 2021-12-29 DIAGNOSIS — H5461 Unqualified visual loss, right eye, normal vision left eye: Secondary | ICD-10-CM | POA: Diagnosis present

## 2021-12-29 DIAGNOSIS — Z8673 Personal history of transient ischemic attack (TIA), and cerebral infarction without residual deficits: Secondary | ICD-10-CM | POA: Diagnosis not present

## 2021-12-29 DIAGNOSIS — Z86711 Personal history of pulmonary embolism: Secondary | ICD-10-CM

## 2021-12-29 DIAGNOSIS — G9389 Other specified disorders of brain: Secondary | ICD-10-CM | POA: Diagnosis present

## 2021-12-29 DIAGNOSIS — I10 Essential (primary) hypertension: Secondary | ICD-10-CM | POA: Diagnosis not present

## 2021-12-29 DIAGNOSIS — G9349 Other encephalopathy: Secondary | ICD-10-CM | POA: Diagnosis present

## 2021-12-29 DIAGNOSIS — Z8249 Family history of ischemic heart disease and other diseases of the circulatory system: Secondary | ICD-10-CM | POA: Diagnosis not present

## 2021-12-29 DIAGNOSIS — Z86718 Personal history of other venous thrombosis and embolism: Secondary | ICD-10-CM

## 2021-12-29 DIAGNOSIS — Z8262 Family history of osteoporosis: Secondary | ICD-10-CM | POA: Diagnosis not present

## 2021-12-29 DIAGNOSIS — Z96659 Presence of unspecified artificial knee joint: Secondary | ICD-10-CM

## 2021-12-29 DIAGNOSIS — T8450XD Infection and inflammatory reaction due to unspecified internal joint prosthesis, subsequent encounter: Secondary | ICD-10-CM | POA: Diagnosis not present

## 2021-12-29 DIAGNOSIS — Z951 Presence of aortocoronary bypass graft: Secondary | ICD-10-CM | POA: Diagnosis not present

## 2021-12-29 DIAGNOSIS — Z79899 Other long term (current) drug therapy: Secondary | ICD-10-CM | POA: Diagnosis not present

## 2021-12-29 DIAGNOSIS — R1110 Vomiting, unspecified: Secondary | ICD-10-CM | POA: Diagnosis not present

## 2021-12-29 DIAGNOSIS — R509 Fever, unspecified: Secondary | ICD-10-CM | POA: Diagnosis not present

## 2021-12-29 DIAGNOSIS — T8459XA Infection and inflammatory reaction due to other internal joint prosthesis, initial encounter: Secondary | ICD-10-CM

## 2021-12-29 DIAGNOSIS — Z7901 Long term (current) use of anticoagulants: Secondary | ICD-10-CM

## 2021-12-29 DIAGNOSIS — E7800 Pure hypercholesterolemia, unspecified: Secondary | ICD-10-CM | POA: Diagnosis present

## 2021-12-29 DIAGNOSIS — R569 Unspecified convulsions: Secondary | ICD-10-CM | POA: Diagnosis not present

## 2021-12-29 DIAGNOSIS — M109 Gout, unspecified: Secondary | ICD-10-CM | POA: Diagnosis present

## 2021-12-29 DIAGNOSIS — E119 Type 2 diabetes mellitus without complications: Secondary | ICD-10-CM | POA: Diagnosis present

## 2021-12-29 LAB — DIFFERENTIAL
Abs Immature Granulocytes: 0.01 10*3/uL (ref 0.00–0.07)
Basophils Absolute: 0 10*3/uL (ref 0.0–0.1)
Basophils Relative: 1 %
Eosinophils Absolute: 0.3 10*3/uL (ref 0.0–0.5)
Eosinophils Relative: 5 %
Immature Granulocytes: 0 %
Lymphocytes Relative: 18 %
Lymphs Abs: 1 10*3/uL (ref 0.7–4.0)
Monocytes Absolute: 0.6 10*3/uL (ref 0.1–1.0)
Monocytes Relative: 10 %
Neutro Abs: 3.8 10*3/uL (ref 1.7–7.7)
Neutrophils Relative %: 66 %

## 2021-12-29 LAB — COMPREHENSIVE METABOLIC PANEL
ALT: 14 U/L (ref 0–44)
AST: 28 U/L (ref 15–41)
Albumin: 3.5 g/dL (ref 3.5–5.0)
Alkaline Phosphatase: 80 U/L (ref 38–126)
Anion gap: 13 (ref 5–15)
BUN: 18 mg/dL (ref 8–23)
CO2: 20 mmol/L — ABNORMAL LOW (ref 22–32)
Calcium: 9.1 mg/dL (ref 8.9–10.3)
Chloride: 103 mmol/L (ref 98–111)
Creatinine, Ser: 0.98 mg/dL (ref 0.61–1.24)
GFR, Estimated: >60 mL/min (ref >60)
Glucose, Bld: 109 mg/dL — ABNORMAL HIGH (ref 70–99)
Potassium: 4.3 mmol/L (ref 3.5–5.1)
Sodium: 136 mmol/L (ref 135–145)
Total Bilirubin: 0.9 mg/dL (ref 0.3–1.2)
Total Protein: 6.8 g/dL (ref 6.5–8.1)

## 2021-12-29 LAB — PROTIME-INR
INR: 1.3 — ABNORMAL HIGH (ref 0.8–1.2)
Prothrombin Time: 15.8 seconds — ABNORMAL HIGH (ref 11.4–15.2)

## 2021-12-29 LAB — I-STAT CHEM 8, ED
BUN: 21 mg/dL (ref 8–23)
Calcium, Ion: 1.21 mmol/L (ref 1.15–1.40)
Chloride: 105 mmol/L (ref 98–111)
Creatinine, Ser: 1 mg/dL (ref 0.61–1.24)
Glucose, Bld: 105 mg/dL — ABNORMAL HIGH (ref 70–99)
HCT: 35 % — ABNORMAL LOW (ref 39.0–52.0)
Hemoglobin: 11.9 g/dL — ABNORMAL LOW (ref 13.0–17.0)
Potassium: 4.2 mmol/L (ref 3.5–5.1)
Sodium: 139 mmol/L (ref 135–145)
TCO2: 24 mmol/L (ref 22–32)

## 2021-12-29 LAB — CBC
HCT: 35.8 % — ABNORMAL LOW (ref 39.0–52.0)
Hemoglobin: 11.1 g/dL — ABNORMAL LOW (ref 13.0–17.0)
MCH: 27.7 pg (ref 26.0–34.0)
MCHC: 31 g/dL (ref 30.0–36.0)
MCV: 89.3 fL (ref 80.0–100.0)
Platelets: 212 10*3/uL (ref 150–400)
RBC: 4.01 MIL/uL — ABNORMAL LOW (ref 4.22–5.81)
RDW: 15.9 % — ABNORMAL HIGH (ref 11.5–15.5)
WBC: 5.8 10*3/uL (ref 4.0–10.5)
nRBC: 0 % (ref 0.0–0.2)

## 2021-12-29 LAB — RAPID URINE DRUG SCREEN, HOSP PERFORMED
Amphetamines: NOT DETECTED
Barbiturates: NOT DETECTED
Benzodiazepines: NOT DETECTED
Cocaine: NOT DETECTED
Opiates: NOT DETECTED
Tetrahydrocannabinol: NOT DETECTED

## 2021-12-29 LAB — URINALYSIS, ROUTINE W REFLEX MICROSCOPIC
Bilirubin Urine: NEGATIVE
Glucose, UA: >=500 mg/dL — AB
Hgb urine dipstick: NEGATIVE
Ketones, ur: NEGATIVE mg/dL
Leukocytes,Ua: NEGATIVE
Nitrite: NEGATIVE
Protein, ur: NEGATIVE mg/dL
Specific Gravity, Urine: 1.012 (ref 1.005–1.030)
pH: 5 (ref 5.0–8.0)

## 2021-12-29 LAB — APTT: aPTT: 30 seconds (ref 24–36)

## 2021-12-29 LAB — ETHANOL: Alcohol, Ethyl (B): <10 mg/dL (ref <10)

## 2021-12-29 LAB — CBG MONITORING, ED: Glucose-Capillary: 122 mg/dL — ABNORMAL HIGH (ref 70–99)

## 2021-12-29 MED ORDER — SODIUM CHLORIDE 0.9 % IV SOLN
1000.0000 mL | INTRAVENOUS | Status: DC
  Administered 2021-12-29 – 2021-12-31 (×8): 1000 mL via INTRAVENOUS

## 2021-12-29 MED ORDER — LEVETIRACETAM IN NACL 500 MG/100ML IV SOLN
500.0000 mg | Freq: Once | INTRAVENOUS | Status: AC
  Administered 2021-12-29: 500 mg via INTRAVENOUS
  Filled 2021-12-29: qty 100

## 2021-12-29 MED ORDER — ORAL CARE MOUTH RINSE
15.0000 mL | OROMUCOSAL | Status: DC
  Administered 2021-12-29 – 2021-12-30 (×5): 15 mL via OROMUCOSAL

## 2021-12-29 MED ORDER — SPIRONOLACTONE 12.5 MG HALF TABLET
12.5000 mg | ORAL_TABLET | Freq: Every day | ORAL | Status: DC
  Administered 2021-12-29 – 2021-12-31 (×3): 12.5 mg via ORAL
  Filled 2021-12-29 (×3): qty 1

## 2021-12-29 MED ORDER — CARVEDILOL 3.125 MG PO TABS
3.1250 mg | ORAL_TABLET | Freq: Two times a day (BID) | ORAL | Status: DC
  Administered 2021-12-29 – 2021-12-31 (×6): 3.125 mg via ORAL
  Filled 2021-12-29 (×6): qty 1

## 2021-12-29 MED ORDER — ACETAMINOPHEN 650 MG RE SUPP: 650.0000 mg | RECTAL | Status: DC | PRN

## 2021-12-29 MED ORDER — CHLORHEXIDINE GLUCONATE 0.12% ORAL RINSE (MEDLINE KIT)
15.0000 mL | Freq: Two times a day (BID) | OROMUCOSAL | Status: DC
  Administered 2021-12-29: 15 mL via OROMUCOSAL

## 2021-12-29 MED ORDER — LORAZEPAM 2 MG/ML IJ SOLN
INTRAMUSCULAR | Status: AC
  Administered 2021-12-29: 1 mg via INTRAVENOUS
  Filled 2021-12-29: qty 1

## 2021-12-29 MED ORDER — OXYCODONE HCL 5 MG PO TABS: 5.0000 mg | ORAL_TABLET | ORAL | Status: DC | PRN

## 2021-12-29 MED ORDER — ALLOPURINOL 100 MG PO TABS
300.0000 mg | ORAL_TABLET | Freq: Every day | ORAL | Status: DC
  Administered 2021-12-29 – 2021-12-31 (×3): 300 mg via ORAL
  Filled 2021-12-29 (×3): qty 3

## 2021-12-29 MED ORDER — RIVAROXABAN 20 MG PO TABS
20.0000 mg | ORAL_TABLET | Freq: Every day | ORAL | Status: DC
  Administered 2021-12-29 – 2021-12-30 (×2): 20 mg via ORAL
  Filled 2021-12-29 (×2): qty 1

## 2021-12-29 MED ORDER — ONDANSETRON HCL 4 MG/2ML IJ SOLN: 4.0000 mg | Freq: Four times a day (QID) | INTRAMUSCULAR | Status: DC | PRN

## 2021-12-29 MED ORDER — LEVETIRACETAM IN NACL 1500 MG/100ML IV SOLN
1500.0000 mg | Freq: Once | INTRAVENOUS | Status: AC
  Administered 2021-12-29: 1500 mg via INTRAVENOUS
  Filled 2021-12-29: qty 100

## 2021-12-29 MED ORDER — LORAZEPAM 2 MG/ML IJ SOLN: 2.0000 mg | Freq: Once | INTRAMUSCULAR | Status: AC

## 2021-12-29 MED ORDER — LOSARTAN POTASSIUM 25 MG PO TABS
25.0000 mg | ORAL_TABLET | Freq: Every day | ORAL | Status: DC
  Administered 2021-12-29 – 2021-12-31 (×3): 25 mg via ORAL
  Filled 2021-12-29 (×3): qty 1

## 2021-12-29 MED ORDER — DOCUSATE SODIUM 100 MG PO CAPS
100.0000 mg | ORAL_CAPSULE | Freq: Every day | ORAL | Status: DC
  Administered 2021-12-29 – 2021-12-30 (×2): 100 mg via ORAL
  Filled 2021-12-29 (×2): qty 1

## 2021-12-29 MED ORDER — LIDOCAINE VISCOUS HCL 2 % MT SOLN
15.0000 mL | Freq: Four times a day (QID) | OROMUCOSAL | Status: DC | PRN
  Administered 2021-12-29: 15 mL via OROMUCOSAL
  Filled 2021-12-29 (×2): qty 15

## 2021-12-29 MED ORDER — LORAZEPAM 2 MG/ML IJ SOLN
1.0000 mg | Freq: Once | INTRAMUSCULAR | Status: AC
  Administered 2021-12-29: 1 mg via INTRAVENOUS
  Filled 2021-12-29: qty 1

## 2021-12-29 MED ORDER — DOXYCYCLINE HYCLATE 50 MG PO CAPS
50.0000 mg | ORAL_CAPSULE | Freq: Two times a day (BID) | ORAL | Status: DC
  Administered 2021-12-29 – 2021-12-31 (×5): 50 mg via ORAL
  Filled 2021-12-29 (×6): qty 1

## 2021-12-29 MED ORDER — LEVETIRACETAM IN NACL 500 MG/100ML IV SOLN
500.0000 mg | Freq: Two times a day (BID) | INTRAVENOUS | Status: DC
  Administered 2021-12-29 – 2021-12-31 (×5): 500 mg via INTRAVENOUS
  Filled 2021-12-29 (×6): qty 100

## 2021-12-29 MED ORDER — SERTRALINE HCL 50 MG PO TABS
150.0000 mg | ORAL_TABLET | Freq: Every day | ORAL | Status: DC
  Administered 2021-12-29 – 2021-12-31 (×3): 150 mg via ORAL
  Filled 2021-12-29 (×3): qty 1

## 2021-12-29 MED ORDER — ONDANSETRON HCL 4 MG PO TABS: 4.0000 mg | ORAL_TABLET | Freq: Four times a day (QID) | ORAL | Status: DC | PRN

## 2021-12-29 MED ORDER — DAPAGLIFLOZIN PROPANEDIOL 10 MG PO TABS
10.0000 mg | ORAL_TABLET | Freq: Every day | ORAL | Status: DC
  Administered 2021-12-29 – 2021-12-31 (×3): 10 mg via ORAL
  Filled 2021-12-29 (×4): qty 1

## 2021-12-29 MED ORDER — SODIUM CHLORIDE 0.9 % IV SOLN: 75.0000 mL/h | INTRAVENOUS | Status: DC

## 2021-12-29 MED ORDER — LORAZEPAM 2 MG/ML IJ SOLN: 1.0000 mg | Freq: Once | INTRAMUSCULAR | Status: AC

## 2021-12-29 MED ORDER — SODIUM CHLORIDE 0.9 % IV BOLUS (SEPSIS)
1000.0000 mL | Freq: Once | INTRAVENOUS | Status: AC
Start: 2021-12-29 — End: 2021-12-29
  Administered 2021-12-29: 1000 mL via INTRAVENOUS

## 2021-12-29 MED ORDER — LORAZEPAM 2 MG/ML IJ SOLN
INTRAMUSCULAR | Status: AC
  Filled 2021-12-29: qty 1

## 2021-12-29 MED ORDER — SIMVASTATIN 20 MG PO TABS
40.0000 mg | ORAL_TABLET | Freq: Every day | ORAL | Status: DC
  Administered 2021-12-29 – 2021-12-30 (×2): 40 mg via ORAL
  Filled 2021-12-29 (×2): qty 2

## 2021-12-29 MED ORDER — ACETAMINOPHEN 325 MG PO TABS: 650.0000 mg | ORAL_TABLET | ORAL | Status: DC | PRN

## 2021-12-29 NOTE — Assessment & Plan Note (Addendum)
2 seizures, 1 witnessed in ED, tonic-clonic. ?Remote stroke ?Seen by neurology. ?1. Seizure precautions ?2. AEDs per neurology ?1. Got ativan and 2g keppra in ED ?2. Per neuro: Keppra '500mg'$  BID (ordered IV for now, convert to PO when able to take POs) ?3. Tele monitor ?4. Also per neuro: ?1. Routine EEG ?2. MRI brain w/o contrast ?5. Concerned about xarelto use and risk of trauma with new onset seizures, will have to discuss risks / benefits prior to discharge. ?6. But for the moment will leave him on xarelto given h/o recurrent thrombi. ?

## 2021-12-29 NOTE — ED Notes (Signed)
EEG technician at pt bedside ?

## 2021-12-29 NOTE — ED Notes (Signed)
Posey pad placed under pt for fall risk. ?

## 2021-12-29 NOTE — Progress Notes (Signed)
Pt unable to hold still and we were unsuccessful in redirecting him. Called pt's RN and they were unable to check about ordering meds and said to just send pt back that we could try again later. ?

## 2021-12-29 NOTE — Progress Notes (Signed)
?  Transition of Care (TOC) Screening Note ? ? ?Patient Details  ?Name: Patrick Fritz. ?Date of Birth: 11-30-1950 ? ? ?Transition of Care (TOC) CM/SW Contact:    ?Pollie Friar, RN ?Phone Number: ?12/29/2021, 2:59 PM ? ? ? ?Transition of Care Department [facility]) has reviewed patient and no TOC needs have been identified at this time. We will continue to monitor patient advancement through interdisciplinary progression rounds. If new patient transition needs arise, please place a TOC consult. ?  ?

## 2021-12-29 NOTE — Consult Note (Signed)
NEUROLOGY CONSULTATION NOTE   Date of service: December 29, 2021 Patient Name: Patrick Fritz. MRN:  [id number] DOB:  01-21-1951 Reason for consult: "seizures" Requesting Provider: Nira Conn, * _ _ _   _ __   _ __ _ _  __ __   _ __   __ _  History of Present Illness  Patrick Fritz. is a 71 y.o. male with PMH significant for hypertension, hyperlipidemia, obstructive sleep apnea, DVT/PE on chronic anticoagulation with Xarelto, degenerative disc disease, knee replacement complicated by infection who presents with seizures x2.  Patient's wife reports that patient was not feeling well today.  He took a bath and then went to bed.  About 15 minutes after going to bed, they heard a loud noise and found him with blood coming out of his mouth and biting on his tongue and he had sonorous respirations.  He was not responding to them so they called EMS and he was brought into the ED.  He was following commands when he came into the ED initially.  In the ED, he had a 2 minutes witnessed generalized tonic-clonic seizure and was postictal afterwards.  Wife reports that back in September 2022, he had a event concerning for a seizure but some confusion and he was unable to get into his neurologist.  He has never had seizures in the past, he occasionally drinks alcohol about 1-2 times a week, he never binge drinks alcohol.  He has a history of TIA, has had a right frontal lobe stroke with encephalomalacia.  He has never had any ICH, no prior history of CNS infection or CNS surgery.  In the ED, CT head without contrast with old right frontal lobe infarct, chronic microvascular ischemic changes but no acute intracranial abnormality.  He was given Ativan 3 mg and loaded with Keppra 2 g IV once.  On my evaluation, patient is somnolent and unable to provide any history and most of the history was obtained from patient's wife was at the bedside.   ROS  Unable to obtain a detailed review of system due to  somnolence.  Past History   Past Medical History:  Diagnosis Date   Cancer (HCC)    skin   DJD (degenerative joint disease), lumbar    DVT (deep venous thrombosis) ([facility]CC)    Dysrhythmia    Family history of adverse reaction to anesthesia    Glaucoma    R eye diminished vision approx 50%   High cholesterol    Hyperlipidemia    Hypertension    IFG (impaired fasting glucose)    OSA (obstructive sleep apnea)    Severe w AHI 34/hr on HST no on BiPAP at 19/15cm H2O   PE (pulmonary embolism)    Second occurance,enknown etiology,lifelong anticoagulation   Past Surgical History:  Procedure Laterality Date   CARDIAC CATHETERIZATION N/A 05/14/2016   Procedure: Left Heart Cath and Coronary Angiography;  Surgeon: Lyn Records, MD;  Location: [facility];  Service: Cardiovascular;  Laterality: N/A;   CORONARY ARTERY BYPASS GRAFT N/A 05/14/2016   Procedure: CORONARY ARTERY BYPASS GRAFTING (CABG) times two with LIMA to LAD and right leg SVG to PDA,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;  Surgeon: Kerin Perna, MD;  Location: [facility];  Service: Open Heart Surgery;  Laterality: N/A;   EXCISIONAL TOTAL KNEE ARTHROPLASTY WITH ANTIBIOTIC SPACERS Right 05/26/2021   Procedure: EXCISIONAL TOTAL KNEE ARTHROPLASTY WITH ANTIBIOTIC SPACERS;  Surgeon: Samson Frederic, MD;  Location: [facility];  Service: Orthopedics;  Laterality: Right;   KNEE SURGERY Bilateral    arthroscopic   TOTAL KNEE ARTHROPLASTY Bilateral 1996   TOTAL KNEE REVISION Right 09/22/2021   Procedure: RIGHT KNEE REMOVAL SPACER TOTAL KNEE REVISION;  Surgeon: Samson Frederic, MD;  Location: [facility];  Service: Orthopedics;  Laterality: Right;  210   Family History  Problem Relation Age of Onset   Osteoporosis Mother    Hypertension Father    CAD Father    Social History   Socioeconomic History   Marital status: Married    Spouse name: Not on file   Number of children: Not on file   Years of education: Not on file   Highest  education level: Not on file  Occupational History   Not on file  Tobacco Use   Smoking status: Never   Smokeless tobacco: Never  Vaping Use   Vaping Use: Never used  Substance and Sexual Activity   Alcohol use: Yes    Comment: 1-2 per week   Drug use: Never   Sexual activity: Not on file  Other Topics Concern   Not on file  Social History Narrative   Not on file   Social Determinants of Health   Financial Resource Strain: Not on file  Food Insecurity: Not on file  Transportation Needs: Not on file  Physical Activity: Not on file  Stress: Not on file  Social Connections: Not on file   No Known Allergies  Medications  (Not in a hospital admission)    Vitals   Vitals:   12/29/21 0220 12/29/21 0251 12/29/21 0300 12/29/21 0315  BP:   (!) 147/75 133/70  Pulse: (!) 106 (!) 103 99 99  Resp: (!) 24 (!) 23 (!) 23 19  Temp:      TempSrc:      SpO2: 99% 100%  97%  Weight:      Height:         Body mass index is 32.28 kg/m.  Physical Exam   General: Laying comfortably in bed; in no acute distress.  HENT: Normal oropharynx and mucosa. Normal external appearance of ears and nose.  Neck: Supple, no pain or tenderness  CV: No JVD. No peripheral edema.  Pulmonary: Symmetric Chest rise. Normal respiratory effort.  Abdomen: Soft to touch, non-tender.  Ext: No cyanosis, edema, or deformity  Skin: No rash. Normal palpation of skin.   Musculoskeletal: Normal digits and nails by inspection. No clubbing.   Neurologic Examination  Mental status/Cognition: Opens eyes partially and briefly to loud voice and to vigorous tactile stimulation.  Closes his eyes immediately goes back to sleep.  Does not answer any orientation questions. Speech/language: His somnolence precludes detailed assessment of his speech.  He is able to follow simple commands and is able to give me a thumbs up with both of his hands and is able to wiggle his toes on command.  Cranial nerves:   CN II Pupils  equal and reactive to light, unable to assess for visual field deficit.   CN III,IV,VI EOM intact, no gaze preference or deviation, no nystagmus   CN V Corneals intact BL   CN VII Symmetric facial grimace.   CN VIII Opens eyes to loud voice, does not turn head towards voice.   CN IX & X normal palatal elevation, no uvular deviation.   CN XI Unable to assess but protecting his airway   CN XII midline tongue protrusion   Motor:  Muscle bulk:  normal, tone flaccid Somnolence precludes detailed strength testing but he is moving all of his arms and legs spontaneously and follows commands in all 4.  Reflexes:  Right Left Comments  Pectoralis      Biceps (C5/6) 2 2   Brachioradialis (C5/6) 2 2    Triceps (C6/7) 2 2    Patellar (L3/4) - 2 Unable to elicit in R Knee secondary to TKR   Achilles (S1)      Hoffman      Plantar     Jaw jerk    Sensation:  Light touch Grimaces and localizes to pinch in all extremities.   Pin prick    Temperature    Vibration   Proprioception    Coordination/Complex Motor:  Unable to assess. - Gait: deferred for patient safety.  Labs   CBC:  Recent Labs  Lab 12/29/21 0201 12/29/21 0220  WBC 5.8  --   NEUTROABS 3.8  --   HGB 11.1* 11.9*  HCT 35.8* 35.0*  MCV 89.3  --   PLT 212  --     Basic Metabolic Panel:  Lab Results  Component Value Date   NA 139 12/29/2021   K 4.2 12/29/2021   CO2 20 (L) 12/29/2021   GLUCOSE 105 (H) 12/29/2021   BUN 21 12/29/2021   CREATININE 1.00 12/29/2021   CALCIUM 9.1 12/29/2021   GFRNONAA >60 12/29/2021   GFRAA 99 10/21/2020   Lipid Panel:  Lab Results  Component Value Date   LDLCALC UNABLE TO CALCULATE IF TRIGLYCERIDE OVER 400 mg/dL 08/12/2535   UYQI3K:  Lab Results  Component Value Date   HGBA1C 6.3 (H) 05/24/2021   Urine Drug Screen:     Component Value Date/Time   LABOPIA NONE DETECTED 01/22/2021 0550   COCAINSCRNUR NONE DETECTED 01/22/2021 0550   LABBENZ NONE DETECTED 01/22/2021 0550    AMPHETMU NONE DETECTED 01/22/2021 0550   THCU NONE DETECTED 01/22/2021 0550   LABBARB NONE DETECTED 01/22/2021 0550    Alcohol Level     Component Value Date/Time   ETH <10 12/29/2021 0201    CT Head without contrast(Personally reviewed): CTH was negative for a large hypodensity concerning for a large territory infarct or hyperdensity concerning for an ICH. He does have an old chronic R frontal lobe infarct.  MRI Brain: pending  rEEG:  pending  Impression   Patrick Fritz. is a 71 y.o. male with PMH significant for hypertension, hyperlipidemia, obstructive sleep apnea, DVT/PE on chronic anticoagulation with Xarelto, degenerative disc disease, knee replacement complicated by infection who presents with seizures x2. He had a seizure in sept 2022 but due to some confusion, never followe up with his neurologist.  With the noted prior R frontal lobe encephalomalacia likely from an old infarct, I would recommend continuing Keppra 500mg  BID.  Recommendations  - MRI Brain w/o contrast - routine EEG. - Keppra load of 2G Iv once followed by Keppra 500mg  BID - Ativan 1mg  PRN for seizure lasting more than 3 mins - Seizure precautions. - No driving for 6 months. Has to be seizure free before he can resume driving. This ws discussed in detail with his wife at the bedside. - Observe overnight to ensure somnolence improves. - Full discharge seizure precautions listed below. ______________________________________________________________________   Thank you for the opportunity to take part in the care of this patient. If you have any further questions, please contact the neurology consultation attending.  Signed,  Erick Blinks [facility] Pager Number [phone] _ _  _   _ __   _ __ _ _  __ __   _ __   __ _   Seizure precautions: Per [facility] statutes, patients with seizures are not allowed to drive until they have been seizure-free for six months and cleared  by a physician    Use caution when using heavy equipment or power tools. Avoid working on ladders or at heights. Take showers instead of baths. Ensure the water temperature is not too high on the home water heater. Do not go swimming alone. Do not lock yourself in a room alone (i.e. bathroom). When caring for infants or small children, sit down when holding, feeding, or changing them to minimize risk of injury to the child in the event you have a seizure. Maintain good sleep hygiene. Avoid alcohol.    If patient has another seizure, call 911 and bring them back to the ED if: A.  The seizure lasts longer than 5 minutes.      B.  The patient doesn't wake shortly after the seizure or has new problems such as difficulty seeing, speaking or moving following the seizure C.  The patient was injured during the seizure D.  The patient has a temperature over 102 F (39C) E.  The patient vomited during the seizure and now is having trouble breathing    During the Seizure   - First, ensure adequate ventilation and place patients on the floor on their left side  Loosen clothing around the neck and ensure the airway is patent. If the patient is clenching the teeth, do not force the mouth open with any object as this can cause severe damage - Remove all items from the surrounding that can be hazardous. The patient may be oblivious to what's happening and may not even know what he or she is doing. If the patient is confused and wandering, either gently guide him/her away and block access to outside areas - Reassure the individual and be comforting - Call 911. In most cases, the seizure ends before EMS arrives. However, there are cases when seizures may last over 3 to 5 minutes. Or the individual may have developed breathing difficulties or severe injuries. If a pregnant patient or a person with diabetes develops a seizure, it is prudent to call an ambulance. - Finally, if the patient does not regain full  consciousness, then call EMS. Most patients will remain confused for about 45 to 90 minutes after a seizure, so you must use judgment in calling for help. - Avoid restraints but make sure the patient is in a bed with padded side rails - Place the individual in a lateral position with the neck slightly flexed; this will help the saliva drain from the mouth and prevent the tongue from falling backward - Remove all nearby furniture and other hazards from the area - Provide verbal assurance as the individual is regaining consciousness - Provide the patient with privacy if possible - Call for help and start treatment as ordered by the caregiver    After the Seizure (Postictal Stage)   After a seizure, most patients experience confusion, fatigue, muscle pain and/or a headache. Thus, one should permit the individual to sleep. For the next few days, reassurance is essential. Being calm and helping reorient the person is also of importance.   Most seizures are painless and end spontaneously. Seizures are not harmful to others but can lead to complications such as stress on the lungs, brain and the  heart. Individuals with prior lung problems may develop labored breathing and respiratory distress.

## 2021-12-29 NOTE — Procedures (Signed)
Patient Name: Joshia Kitchings.  ?MRN: [id number]  ?Epilepsy Attending: Lora Havens  ?Referring Physician/Provider: Etta Quill, DO ?Date: 12/29/2021 ?Duration: 22.48 mins ? ?Patient history: 71 y.o. male with PMH significant for hypertension, hyperlipidemia, obstructive sleep apnea, DVT/PE on chronic anticoagulation with Xarelto, degenerative disc disease, knee replacement complicated by infection who presents with seizures x2.  EEG to evaluate for seizure. ? ?Level of alertness: Awake, asleep ? ?AEDs during EEG study: LEV, Ativan ? ?Technical aspects: This EEG study was done with scalp electrodes positioned according to the 10-20 International system of electrode placement. Electrical activity was acquired at a sampling rate of '500Hz'$  and reviewed with a high frequency filter of '70Hz'$  and a low frequency filter of '1Hz'$ . EEG data were recorded continuously and digitally stored.  ? ?Description: The posterior dominant rhythm consists of 9 Hz activity of moderate voltage (25-35 uV) seen predominantly in posterior head regions, symmetric and reactive to eye opening and eye closing. Sleep was characterized by vertex waves, sleep spindles (12 to 14 Hz), maximal frontocentral region.  EEG also showed intermittent generalized 5 to 6 Hz theta slowing admixed with intermittent generalized 13 to 14 Hz generalized beta activity.  Hyperventilation and photic stimulation were not performed.    ? ?ABNORMALITY ?- Intermittent slow, generalized ? ?IMPRESSION: ?This study is suggestive of mild diffuse encephalopathy, nonspecific etiology. No seizures or epileptiform discharges were seen throughout the recording. ? ?Lora Havens  ? ?

## 2021-12-29 NOTE — ED Notes (Signed)
Pt's condom catheter removed by self, urinary incontinent again. This RN and Baxter Flattery, RN, cleaned pt, changed pt's linens/gown, and placed Primofit on pt.  ?

## 2021-12-29 NOTE — Progress Notes (Signed)
2nd attempt to scan patient's MRI unsuccessful. Pt unable to follow commands to hold still. He became very confused during scan and started pulling at head coil and trying to crawl out of scanner. Meds given did not seem to help.  ?

## 2021-12-29 NOTE — ED Notes (Signed)
ED TO INPATIENT HANDOFF REPORT ? ?ED Nurse Name and Phone #: Baxter Flattery, RN ? ?S ?Name/Age/Gender ?Patrick Fritz. ?71 y.o. ?male ?Room/Bed: 040C/040C ? ?Code Status ?  Code Status: Full Code ? ?Home/SNF/Other ?Home ?Patient oriented to: self, place, time, and situation ?Is this baseline? Yes  ? ?Triage Complete: Triage complete  ?Chief Complaint ?Seizure ([place]) [R56.9] ? ?Triage Note ?Pt bib gcems from home after witnessed seizure like activity involving full body lasting 3-5 minutes. Upon EMS arrival pt was post ictal A&Ox1. Pt now A&O x4. No hx of seizures. 18 g LAC.  ? ?BP: 132/76, HR 90, Spo2 96% RA, CBG 128  ? ?Allergies ?No Known Allergies ? ?Level of Care/Admitting Diagnosis ?ED Disposition   ? ? ED Disposition  ?Admit  ? Condition  ?--  ? Comment  ?Hospital Area: [facility] [[id number]] ? Level of Care: Telemetry Medical [[id number]] ? May place patient in observation at [facility] or [place] if equivalent level of care is available:: No ? Covid Evaluation: Asymptomatic Screening Protocol (No Symptoms) ? Diagnosis: Seizure ([place]) [774128] ? Admitting Physician: Etta Quill [[id number]] ? Attending Physician: Etta Quill [phone] ?  ?  ? ?  ? ? ?B ?Medical/Surgery History ?Past Medical History:  ?Diagnosis Date  ? Cancer [facility])   ? skin  ? DJD (degenerative joint disease), lumbar   ? DVT (deep venous thrombosis) ([place])   ? Dysrhythmia   ? Family history of adverse reaction to anesthesia   ? Glaucoma   ? R eye diminished vision approx 50%  ? High cholesterol   ? Hyperlipidemia   ? Hypertension   ? IFG (impaired fasting glucose)   ? OSA (obstructive sleep apnea)   ? Severe w AHI 34/hr on HST no on BiPAP at 19/15cm H2O  ? PE (pulmonary embolism)   ? Second occurance,enknown etiology,lifelong anticoagulation  ? ?Past Surgical History:  ?Procedure Laterality Date  ? CARDIAC CATHETERIZATION N/A 05/14/2016  ? Procedure: Left Heart Cath and Coronary Angiography;  Surgeon: Belva Crome, MD;  Location: [place] [facility];  Service: Cardiovascular;  Laterality: N/A;  ? CORONARY ARTERY BYPASS GRAFT N/A 05/14/2016  ? Procedure: CORONARY ARTERY BYPASS GRAFTING (CABG) times two with LIMA to LAD and right leg SVG to PDA,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;  Surgeon: Ivin Poot, MD;  Location: [place];  Service: Open Heart Surgery;  Laterality: N/A;  ? EXCISIONAL TOTAL KNEE ARTHROPLASTY WITH ANTIBIOTIC SPACERS Right 05/26/2021  ? Procedure: EXCISIONAL TOTAL KNEE ARTHROPLASTY WITH ANTIBIOTIC SPACERS;  Surgeon: Rod Can, MD;  [facility];  Service: Orthopedics;  Laterality: Right;  ? KNEE SURGERY Bilateral   ? arthroscopic  ? TOTAL KNEE ARTHROPLASTY Bilateral 1996  ? TOTAL KNEE REVISION Right 09/22/2021  ? Procedure: RIGHT KNEE REMOVAL SPACER TOTAL KNEE REVISION;  Surgeon: Rod Can, MD;  Location: [facility];  Service: Orthopedics;  Laterality: Right;  210  ?  ? ?A ?IV Location/Drains/Wounds ?Patient Lines/Drains/Airways Status   ? ? Active Line/Drains/Airways   ? ? Name Placement date Placement time Site Days  ? Peripheral IV 12/29/21 18 G Left Antecubital 12/29/21  --  Antecubital  less than 1  ? Peripheral IV 12/29/21 20 G Right;Anterior Forearm 12/29/21  0250  Forearm  less than 1  ? Closed System Drain Right Knee 09/22/21  1853  Knee  98  ? Negative Pressure Wound Therapy Knee Anterior;Right 05/26/21  --  --  217  ? Negative Pressure  Wound Therapy Knee Right 09/22/21  1850  --  98  ? External Urinary Catheter 12/29/21  0357  --  less than 1  ? Incision (Closed) 05/26/21 Leg Right 05/26/21  1704  -- 217  ? Incision (Closed) 09/22/21 Knee Right 09/22/21  1839  -- 98  ? Wound / Incision (Open or Dehisced) 05/24/21 Non-pressure wound Arm Anterior;Left;Upper 05/24/21  2000  Arm  219  ? Wound / Incision (Open or Dehisced) 05/24/21 Non-pressure wound Elbow Anterior;Left 05/24/21  2000  Elbow  219  ? ?  ?  ? ?  ? ? ?Intake/Output Last 24 hours ? ?Intake/Output Summary (Last 24 hours) at 12/29/2021  1207 ?Last data filed at 12/29/2021 1113 ?Gross per 24 hour  ?Intake 1250 ml  ?Output --  ?Net 1250 ml  ? ? ?Labs/Imaging ?Results for orders placed or performed during the hospital encounter of 12/29/21 (from the past 48 hour(s))  ?CBG monitoring, ED     Status: Abnormal  ? Collection Time: 12/29/21  1:34 AM  ?Result Value Ref Range  ? Glucose-Capillary 122 (H) 70 - 99 mg/dL  ?  Comment: Glucose reference range applies only to samples taken after fasting for at least 8 hours.  ?Ethanol     Status: None  ? Collection Time: 12/29/21  2:01 AM  ?Result Value Ref Range  ? Alcohol, Ethyl (B) <10 <10 mg/dL  ?  Comment: (NOTE) ?Lowest detectable limit for serum alcohol is 10 mg/dL. ? ?For medical purposes only. ?Performed at [facility], [place] [address], [place], [place] ?[address] ?  ?Protime-INR     Status: Abnormal  ? Collection Time: 12/29/21  2:01 AM  ?Result Value Ref Range  ? Prothrombin Time 15.8 (H) 11.4 - 15.2 seconds  ? INR 1.3 (H) 0.8 - 1.2  ?  Comment: (NOTE) ?INR goal varies based on device and disease states. ?Performed at [facility], [place] [address] [place], [place] ?[address] ?  ?APTT     Status: None  ? Collection Time: 12/29/21  2:01 AM  ?Result Value Ref Range  ? aPTT 30 24 - 36 seconds  ?  Comment: Performed at [facility], [place] [address], [place], [place] [address]  ?CBC     Status: Abnormal  ? Collection Time: 12/29/21  2:01 AM  ?Result Value Ref Range  ? WBC 5.8 4.0 - 10.5 K/uL  ? RBC 4.01 (L) 4.22 - 5.81 MIL/uL  ? Hemoglobin 11.1 (L) 13.0 - 17.0 g/dL  ? HCT 35.8 (L) 39.0 - 52.0 %  ? MCV 89.3 80.0 - 100.0 fL  ? MCH 27.7 26.0 - 34.0 pg  ? MCHC 31.0 30.0 - 36.0 g/dL  ? RDW 15.9 (H) 11.5 - 15.5 %  ? Platelets 212 150 - 400 K/uL  ? nRBC 0.0 0.0 - 0.2 %  ?  Comment: Performed at [facility], [place] [address] [place], [place] [address]  ?Differential     Status: None  ? Collection Time: 12/29/21  2:01 AM  ?Result Value Ref Range  ? Neutrophils Relative % 66 %  ? Neutro Abs 3.8  1.7 - 7.7 K/uL  ? Lymphocytes Relative 18 %  ? Lymphs Abs 1.0 0.7 - 4.0 K/uL  ? Monocytes Relative 10 %  ? Monocytes Absolute 0.6 0.1 - 1.0 K/uL  ? Eosinophils Relative 5 %  ? Eosinophils Absolute 0.3 0.0 - 0.5 K/uL  ? Basophils Relative 1 %  ? Basophils Absolute 0.0  0.0 - 0.1 K/uL  ? Immature Granulocytes 0 %  ? Abs Immature Granulocytes 0.01 0.00 - 0.07 K/uL  ?  Comment: Performed at [facility], [place] [address], [place], [place] [address]  ?Comprehensive metabolic panel     Status: Abnormal  ? Collection Time: 12/29/21  2:01 AM  ?Result Value Ref Range  ? Sodium 136 135 - 145 mmol/L  ? Potassium 4.3 3.5 - 5.1 mmol/L  ? Chloride 103 98 - 111 mmol/L  ? CO2 20 (L) 22 - 32 mmol/L  ? Glucose, Bld 109 (H) 70 - 99 mg/dL  ?  Comment: Glucose reference range applies only to samples taken after fasting for at least 8 hours.  ? BUN 18 8 - 23 mg/dL  ? Creatinine, Ser 0.98 0.61 - 1.24 mg/dL  ? Calcium 9.1 8.9 - 10.3 mg/dL  ? Total Protein 6.8 6.5 - 8.1 g/dL  ? Albumin 3.5 3.5 - 5.0 g/dL  ? AST 28 15 - 41 U/L  ? ALT 14 0 - 44 U/L  ? Alkaline Phosphatase 80 38 - 126 U/L  ? Total Bilirubin 0.9 0.3 - 1.2 mg/dL  ? GFR, Estimated >60 >60 mL/min  ?  Comment: (NOTE) ?Calculated using the CKD-EPI Creatinine Equation (2021) ?  ? Anion gap 13 5 - 15  ?  Comment: Performed at [facility], [place] [address], [place], [place] [address]  ?I-stat chem 8, ED     Status: Abnormal  ? Collection Time: 12/29/21  2:20 AM  ?Result Value Ref Range  ? Sodium 139 135 - 145 mmol/L  ? Potassium 4.2 3.5 - 5.1 mmol/L  ? Chloride 105 98 - 111 mmol/L  ? BUN 21 8 - 23 mg/dL  ? Creatinine, Ser 1.00 0.61 - 1.24 mg/dL  ? Glucose, Bld 105 (H) 70 - 99 mg/dL  ?  Comment: Glucose reference range applies only to samples taken after fasting for at least 8 hours.  ? Calcium, Ion 1.21 1.15 - 1.40 mmol/L  ? TCO2 24 22 - 32 mmol/L  ? Hemoglobin 11.9 (L) 13.0 - 17.0 g/dL  ? HCT 35.0 (L) 39.0 - 52.0 %  ?Urine rapid drug screen (hosp performed)     Status: None  ?  Collection Time: 12/29/21  9:00 AM  ?Result Value Ref Range  ? Opiates NONE DETECTED NONE DETECTED  ? Cocaine NONE DETECTED NONE DETECTED  ? Benzodiazepines NONE DETECTED NONE DETECTED  ? Amphetamines NONE DETECTED

## 2021-12-29 NOTE — ED Notes (Signed)
Pt had witnessed seizure lasting approx 2 minutes. RN and MD at bedside with suction and NRB. Verbal order for '2mg'$  ativan IV given. Seizure stopped. Pt post ictal with snoring respirations. Spo2 97% on 4L.  ?

## 2021-12-29 NOTE — Progress Notes (Signed)
EEG complete - results pending. Difficult set up ? Not cooperative due to current mental status. ?

## 2021-12-29 NOTE — ED Notes (Signed)
Injury to tongue noted on arrival.  ?

## 2021-12-29 NOTE — H&P (Addendum)
?History and Physical  ? ? ?Patient: Patrick Fritz. CBU:[id number] DOB: [birth year] ?DOA: 12/29/2021 ?DOS: the patient was seen and examined on 12/29/2021 ?PCP: Mayra Neer, MD  ?Patient coming from: Home ? ?Chief Complaint:  ?Chief Complaint  ?Patient presents with  ? Seizures  ? ?HPI: Patrick Fritz. is a 71 y.o. 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  Old frontal stroke on MRI in April 2022 (remote on MRI at that time). ? ?Pt presents to ED with seizure like activity at home. ? ?Patient was by himself in the bedroom.  Family heard him making strange noises.  When they went in, he was unresponsive with his eyes rolled in the back of his head.  They do not remember seeing any shaking but believe the patient was stiff.  This lasted 2 to 5 minutes.  Patient slowly came to but was disoriented.  EMS was called and noted that patient was postictal but gradually improving.  Patient had positive bite marks on his tongue.  No urinary incontinence. ? ?Pt recently started on colchicine for gout, half a tab every other day, no other med changes. ? ?No trauma. ? ?No recent fevers or infections. ? ?Does have chronic infection of knee, on chronic doxycycline for this. ? ?Wife reports that back in September 2022, he had a event concerning for a seizure but some confusion and he was unable to get into his neurologist. ? ?During ED stay: ?Mental status initially improving, but then he had a second, witnessed this time, seizure while in ED.  Got ativan + keppra load. ? ?Now post-ictal. ?  ?Review of Systems: unable to review all systems due to the inability of the patient to answer questions. ?Past Medical History:  ?Diagnosis Date  ? Cancer [facility])   ? skin  ? DJD (degenerative joint disease), lumbar   ? DVT (deep venous thrombosis) ([place])   ? Dysrhythmia   ? Family history of adverse reaction to anesthesia   ? Glaucoma   ? R eye diminished vision approx 50%  ? High cholesterol   ?  Hyperlipidemia   ? Hypertension   ? IFG (impaired fasting glucose)   ? OSA (obstructive sleep apnea)   ? Severe w AHI 34/hr on HST no on BiPAP at 19/15cm H2O  ? PE (pulmonary embolism)   ? Second occurance,enknown etiology,lifelong anticoagulation  ? ?Past Surgical History:  ?Procedure Laterality Date  ? CARDIAC CATHETERIZATION N/A 05/14/2016  ? Procedure: Left Heart Cath and Coronary Angiography;  Surgeon: Belva Crome, MD;  Location: [place] [facility];  Service: Cardiovascular;  Laterality: N/A;  ? CORONARY ARTERY BYPASS GRAFT N/A 05/14/2016  ? Procedure: CORONARY ARTERY BYPASS GRAFTING (CABG) times two with LIMA to LAD and right leg SVG to PDA,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;  Surgeon: Ivin Poot, MD;  Location: [place];  Service: Open Heart Surgery;  Laterality: N/A;  ? EXCISIONAL TOTAL KNEE ARTHROPLASTY WITH ANTIBIOTIC SPACERS Right 05/26/2021  ? Procedure: EXCISIONAL TOTAL KNEE ARTHROPLASTY WITH ANTIBIOTIC SPACERS;  Surgeon: Rod Can, MD;  [facility];  Service: Orthopedics;  Laterality: Right;  ? KNEE SURGERY Bilateral   ? arthroscopic  ? TOTAL KNEE ARTHROPLASTY Bilateral 1996  ? TOTAL KNEE REVISION Right 09/22/2021  ? Procedure: RIGHT KNEE REMOVAL SPACER TOTAL KNEE REVISION;  Surgeon: Rod Can, MD;  [facility];  Service: Orthopedics;  Laterality: Right;  210  ? ?Social History:  reports that he has  never smoked. He has never used smokeless tobacco. He reports current alcohol use. He reports that he does not use drugs. ? ?No Known Allergies ? ?Family History  ?Problem Relation Age of Onset  ? Osteoporosis Mother   ? Hypertension Father   ? CAD Father   ? ? ?Prior to Admission medications   ?Medication Sig Start Date End Date Taking? Authorizing Provider  ?acetaminophen (TYLENOL) 325 MG tablet Take 650 mg by mouth every 6 (six) hours as needed for moderate pain or headache.   Yes [provider]  ?acetaminophen (TYLENOL) 650 MG CR tablet Take 650 mg by mouth  every 8 (eight) hours as needed for pain.   Yes [provider]  ?allopurinol (ZYLOPRIM) 300 MG tablet Take 300 mg by mouth daily.    Yes [provider]  ?amoxicillin (AMOXIL) 500 MG tablet Take 2,000 mg by mouth See admin instructions. 1 hour prior to dental appointment 07/22/21  Yes [provider]  ?carvedilol (COREG) 3.125 MG tablet Take 1 tablet (3.125 mg total) by mouth 2 (two) times daily. 01/27/21  Yes Belva Crome, MD  ?colchicine (COLCRYS) 0.6 MG tablet Take 0.3 mg by mouth every other day.   Yes [provider]  ?dapagliflozin propanediol (FARXIGA) 10 MG TABS tablet Take 1 tablet (10 mg total) by mouth daily before breakfast. 03/21/21  Yes Belva Crome, MD  ?docusate sodium (COLACE) 100 MG capsule Take 1 capsule (100 mg total) by mouth 2 (two) times daily. ?Patient taking differently: Take 100 mg by mouth at bedtime. 09/25/21  Yes Swinteck, Aaron Edelman, MD  ?doxycycline (VIBRAMYCIN) 50 MG capsule Take 1 capsule (50 mg total) by mouth 2 (two) times daily. ?Patient taking differently: Take 50 mg by mouth 2 (two) times daily. Continuously 09/25/21  Yes Swinteck, Aaron Edelman, MD  ?losartan (COZAAR) 25 MG tablet Take 25 mg by mouth daily.   Yes [provider]  ?methocarbamol (ROBAXIN) 500 MG tablet Take 1 tablet (500 mg total) by mouth every 6 (six) hours as needed for muscle spasms. 09/25/21  Yes Swinteck, Aaron Edelman, MD  ?ondansetron (ZOFRAN) 4 MG tablet Take 1 tablet (4 mg total) by mouth every 6 (six) hours as needed for nausea. 09/25/21  Yes Swinteck, Aaron Edelman, MD  ?oxyCODONE (OXY IR/ROXICODONE) 5 MG immediate release tablet Take 1 tablet (5 mg total) by mouth every 4 (four) hours as needed for severe pain. 09/25/21  Yes Swinteck, Aaron Edelman, MD  ?polyethylene glycol (MIRALAX / GLYCOLAX) 17 g packet Take 17 g by mouth daily as needed for mild constipation. 09/25/21  Yes Swinteck, Aaron Edelman, MD  ?rivaroxaban (XARELTO) 20 MG TABS tablet Take 1 tablet (20 mg total) by mouth daily with  supper. ?Patient taking differently: Take 20 mg by mouth at bedtime. 06/09/16  Yes Isaiah Serge, NP  ?sertraline (ZOLOFT) 100 MG tablet Take 150 mg by mouth daily. 03/24/21  Yes [provider]  ?simvastatin (ZOCOR) 40 MG tablet Take 1 tablet (40 mg total) by mouth at bedtime. 08/20/20  Yes Belva Crome, MD  ?spironolactone (ALDACTONE) 25 MG tablet Take 0.5 tablets (12.5 mg total) by mouth daily. 01/27/21  Yes Belva Crome, MD  ? ? ?Physical Exam: ?Vitals:  ? 12/29/21 0300 12/29/21 0315 12/29/21 0400 12/29/21 0415  ?BP: (!) 147/75 133/70 137/82 118/65  ?Pulse: 99 99 96 97  ?Resp: (!) '23 19 20 19  '$ ?Temp:      ?TempSrc:      ?SpO2:  97% 99% 100%  ?Weight:      ?  Height:      ? ?Constitutional: Post ictal / sedated ?Eyes: PERRL, lids and conjunctivae normal ?ENMT: Mucous membranes are moist. Posterior pharynx clear of any exudate or lesions.Normal dentition.  ?Neck: normal, supple, no masses, no thyromegaly ?Respiratory: clear to auscultation bilaterally, no wheezing, no crackles. Normal respiratory effort. No accessory muscle use.  ?Cardiovascular: Regular rate and rhythm, no murmurs / rubs / gallops. No extremity edema. 2+ pedal pulses. No carotid bruits.  ?Abdomen: no tenderness, no masses palpated. No hepatosplenomegaly. Bowel sounds positive.  ?Musculoskeletal: no clubbing / cyanosis. No joint deformity upper and lower extremities. Good ROM, no contractures. Normal muscle tone.  ?Skin: no rashes, lesions, ulcers. No induration ?Neurologic: MAE, post-ictal, eye opening to voice, following one step commands. ?Psychiatric: Post-ictal / sedated ? ?Data Reviewed: ? ?CT head = old stroke, nothing acute ?CBC ?   ?Component Value Date/Time  ? WBC 5.8 12/29/2021 0201  ? RBC 4.01 (L) 12/29/2021 0201  ? HGB 11.9 (L) 12/29/2021 0220  ? HCT 35.0 (L) 12/29/2021 0220  ? PLT 212 12/29/2021 0201  ? MCV 89.3 12/29/2021 0201  ? MCH 27.7 12/29/2021 0201  ? MCHC 31.0 12/29/2021 0201  ? RDW 15.9 (H) 12/29/2021 0201  ?  LYMPHSABS 1.0 12/29/2021 0201  ? MONOABS 0.6 12/29/2021 0201  ? EOSABS 0.3 12/29/2021 0201  ? BASOSABS 0.0 12/29/2021 0201  ? ?CMP  ?   ?Component Value Date/Time  ? NA 139 12/29/2021 0220  ? NA 137 10/03/2021 1012  ? K 4.2

## 2021-12-29 NOTE — ED Notes (Signed)
2mg IV ativan given.

## 2021-12-29 NOTE — Plan of Care (Signed)
?  Problem: Education: ?Goal: Expressions of having a comfortable level of knowledge regarding the disease process will increase ?Outcome: Progressing ?  ?Problem: Health Behavior/Discharge Planning: ?Goal: Compliance with prescribed medication regimen will improve ?Outcome: Progressing ?  ?Problem: Medication: ?Goal: Risk for medication side effects will decrease ?Outcome: Progressing ?  ?Problem: Safety: ?Goal: Verbalization of understanding the information provided will improve ?Outcome: Progressing ?  ?Problem: Self-Concept: ?Goal: Level of anxiety will decrease ?Outcome: Progressing ?  ?

## 2021-12-29 NOTE — ED Notes (Signed)
RN messaged main pharmacy for 0300 dose of keppra  ?

## 2021-12-29 NOTE — Assessment & Plan Note (Signed)
Continue chronic doxycycline ?

## 2021-12-29 NOTE — ED Notes (Signed)
Pt transported to CT with RN and MD

## 2021-12-29 NOTE — Progress Notes (Signed)
Patrick Fritz. is a 71 y.o. 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  Old frontal stroke on MRI in April 2022 (remote on MRI at that time). ?  ?Pt presents to ED with seizure like activity at home. ?  ?Wife reports that back in September 2022, he had a event concerning for a seizure but some confusion and he was unable to get into his neurologist. ?  ?During ED stay: ?Mental status initially improving, but then he had a second, witnessed this time, seizure while in ED.  Got ativan + keppra load. ? ?12/29/2021: Patient was seen and examined at his bedside.  His wife was present in the room.  Postictal, somnolent and difficult to arouse.  O2 saturation 98% on room air. ? ?Informed later that the patient fell while trying to get out of bed.  Fall precautions in place. ? ?Please refer to H&P dictated by partner Dr. Alcario Drought on 12/29/2021 for further details of the assessment and plan. ?

## 2021-12-29 NOTE — ED Notes (Signed)
Pt noted to have urinary incontinence. ED staff cleaned pt, changed pt's linens and gown, and applied external catheter/condom catheter to pt. Pt continuing to sleep at this time.  ?

## 2021-12-29 NOTE — ED Provider Notes (Signed)
?[place] ?Provider Note ? ?CSN: [id number] ?Arrival date & time: 12/29/21 0119 ? ?Chief Complaint(s) ?Seizures ? ?HPI ?Patrick Fritz. is a 71 y.o. 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  He presents today for possible seizure at home.  Patient was by himself in the bedroom.  Family heard him making strange noises.  When they went in, he was unresponsive with his eyes rolled in the back of his head.  They do not remember seeing any shaking but believe the patient was stiff.  This lasted 2 to 5 minutes.  Patient slowly came to but was disoriented.  EMS was called and noted that patient was postictal but gradually improving.  Patient had positive bite marks on his tongue.  No urinary incontinence. ? ?He and the family deny any recent fevers or infections.  ?Patient was recently started on colchicine for gout, taking half a tablet every other day. ?No other medication changes. ?No trauma. ? ?The history is provided by the patient.  ? ?Past Medical History ?Past Medical History:  ?Diagnosis Date  ? Cancer [facility])   ? skin  ? DJD (degenerative joint disease), lumbar   ? DVT (deep venous thrombosis) ([place])   ? Dysrhythmia   ? Family history of adverse reaction to anesthesia   ? Glaucoma   ? R eye diminished vision approx 50%  ? High cholesterol   ? Hyperlipidemia   ? Hypertension   ? IFG (impaired fasting glucose)   ? OSA (obstructive sleep apnea)   ? Severe w AHI 34/hr on HST no on BiPAP at 19/15cm H2O  ? PE (pulmonary embolism)   ? Second occurance,enknown etiology,lifelong anticoagulation  ? ?Patient Active Problem List  ? Diagnosis Date Noted  ? Seizure ([place]) 12/29/2021  ? Failed orthopedic implant, initial encounter ([place]) 09/22/2021  ? Infection of total knee replacement ([place])   ? Septic arthritis of  knee, right ([place]) 05/24/2021  ? TIA (transient ischemic attack) 01/22/2021  ? Gout 01/22/2021  ? Glaucoma   ? Hyperlipidemia   ? CAD (coronary artery disease)   ? Aortic atherosclerosis ([place])   ? History of DVT in adulthood   ? Hypertension   ? Bilateral pulmonary embolism ([place]) 07/12/2016  ? S/P CABG x 2 05/22/2016  ? Hx of ascending aorta replacement 05/22/2016  ? Chest pain   ? History of ST elevation myocardial infarction (STEMI)   ? Obstructive sleep apnea 12/16/2013  ? Morbid obesity ([place]) 12/16/2013  ? SOB (shortness of breath) 12/16/2013  ? ?Home Medication(s) ?Prior to Admission medications   ?Medication Sig Start Date End Date Taking? Authorizing Provider  ?acetaminophen (TYLENOL) 325 MG tablet Take 650 mg by mouth every 6 (six) hours as needed for moderate pain or headache.   Yes [provider]  ?acetaminophen (TYLENOL) 650 MG CR tablet Take 650 mg by mouth every 8 (eight) hours as needed for pain.   Yes [provider]  ?allopurinol (ZYLOPRIM) 300 MG tablet Take 300 mg by mouth daily.    Yes [provider]  ?amoxicillin (AMOXIL) 500 MG tablet Take 2,000 mg by mouth See admin instructions. 1 hour prior to dental appointment 07/22/21  Yes [provider]  ?carvedilol (COREG) 3.125 MG tablet Take 1 tablet (3.125 mg total) by mouth 2 (two) times daily. 01/27/21  Yes Tamala Julian,  Lynnell Dike, MD  ?colchicine (COLCRYS) 0.6 MG tablet Take 0.3 mg by mouth every other day.   Yes [provider]  ?dapagliflozin propanediol (FARXIGA) 10 MG TABS tablet Take 1 tablet (10 mg total) by mouth daily before breakfast. 03/21/21  Yes Belva Crome, MD  ?docusate sodium (COLACE) 100 MG capsule Take 1 capsule (100 mg total) by mouth 2 (two) times daily. ?Patient taking differently: Take 100 mg by mouth at bedtime. 09/25/21  Yes Swinteck, Aaron Edelman, MD  ?doxycycline (VIBRAMYCIN) 50 MG capsule Take 1 capsule (50 mg total) by mouth 2 (two) times daily. ?Patient taking differently: Take 50 mg by  mouth 2 (two) times daily. Continuously 09/25/21  Yes Swinteck, Aaron Edelman, MD  ?losartan (COZAAR) 25 MG tablet Take 25 mg by mouth daily.   Yes [provider]  ?methocarbamol (ROBAXIN) 500 MG tablet Take 1 tablet (500 mg total) by mouth every 6 (six) hours as needed for muscle spasms. 09/25/21  Yes Swinteck, Aaron Edelman, MD  ?ondansetron (ZOFRAN) 4 MG tablet Take 1 tablet (4 mg total) by mouth every 6 (six) hours as needed for nausea. 09/25/21  Yes Swinteck, Aaron Edelman, MD  ?oxyCODONE (OXY IR/ROXICODONE) 5 MG immediate release tablet Take 1 tablet (5 mg total) by mouth every 4 (four) hours as needed for severe pain. 09/25/21  Yes Swinteck, Aaron Edelman, MD  ?polyethylene glycol (MIRALAX / GLYCOLAX) 17 g packet Take 17 g by mouth daily as needed for mild constipation. 09/25/21  Yes Swinteck, Aaron Edelman, MD  ?rivaroxaban (XARELTO) 20 MG TABS tablet Take 1 tablet (20 mg total) by mouth daily with supper. ?Patient taking differently: Take 20 mg by mouth at bedtime. 06/09/16  Yes Isaiah Serge, NP  ?sertraline (ZOLOFT) 100 MG tablet Take 150 mg by mouth daily. 03/24/21  Yes [provider]  ?simvastatin (ZOCOR) 40 MG tablet Take 1 tablet (40 mg total) by mouth at bedtime. 08/20/20  Yes Belva Crome, MD  ?spironolactone (ALDACTONE) 25 MG tablet Take 0.5 tablets (12.5 mg total) by mouth daily. 01/27/21  Yes Belva Crome, MD  ?                                                                                                                                  ?Allergies ?Patient has no known allergies. ? ?Review of Systems ?Review of Systems ?As noted in HPI ? ?Physical Exam ?Vital Signs  ?I have reviewed the triage vital signs ?BP 118/65   Pulse 97   Temp 98.7 ?F (37.1 ?C) (Oral)   Resp 19   Ht '5\' 10"'$  (1.778 m)   Wt 102.1 kg   SpO2 100%   BMI 32.28 kg/m?  ? ?Physical Exam ?Vitals reviewed.  ?Constitutional:   ?   General: He is not in acute distress. ?   Appearance: He is well-developed. He is not diaphoretic.  ?HENT:  ?    Head: Normocephalic and atraumatic.  ?   Nose:  Nose normal.  ?   Mouth/Throat:  ? ?Eyes:  ?   General: No scleral icterus.    ?   Right eye: No discharge.     ?   Left eye: No discharge.  ?   Conjunctiva/sclera: Conjunctivae normal.  ?   Pupils: Pupils are equal, round, and reactive to light.  ?Cardiovascular:  ?   Rate and Rhythm: Normal rate and regular rhythm.  ?   Heart sounds: No murmur heard. ?  No friction rub. No gallop.  ?Pulmonary:  ?   Effort: Pulmonary effort is normal. No respiratory distress.  ?   Breath sounds: Normal breath sounds. No stridor. No rales.  ?Abdominal:  ?   General: There is no distension.  ?   Palpations: Abdomen is soft.  ?   Tenderness: There is no abdominal tenderness.  ?Musculoskeletal:     ?   General: No tenderness.  ?   Cervical back: Normal range of motion and neck supple.  ?Skin: ?   General: Skin is warm and dry.  ?   Findings: No erythema or rash.  ?Neurological:  ?   Mental Status: He is alert and oriented to person, place, and time.  ?   Comments: Mental Status:  ?Alert and oriented to person, place, and time.  ?Attention and concentration normal.  ?Speech clear.  ?Recent memory is intact ? ?Cranial Nerves:  ?II Visual Fields: Intact to confrontation. Visual fields intact. ?III, IV, VI: Pupils equal and reactive to light and near. Full eye movement without nystagmus  ?V Facial Sensation: Normal. No weakness of masticatory muscles  ?VII: No facial weakness or asymmetry  ?VIII Auditory Acuity: Grossly normal  ?IX/X: The uvula is midline; the palate elevates symmetrically  ?XI: Normal sternocleidomastoid and trapezius strength  ?XII: The tongue is midline. No atrophy or fasciculations.  ? ?Motor System: Muscle Strength: 5/5 and symmetric in the upper and lower extremities. No pronation or drift.  ?Muscle Tone: Tone and muscle bulk are normal in the upper and lower extremities.  ?Reflexes:  No Clonus ?Coordination: No tremor.  ?Sensation: Intact to light touch. ?Gait:  deferred ?  ? ? ?ED Results and Treatments ?Labs ?(all labs ordered are listed, but only abnormal results are displayed) ?Labs Reviewed  ?PROTIME-INR - Abnormal; Notable for the following components:  ?    Result Value  ? Prot

## 2021-12-29 NOTE — Assessment & Plan Note (Addendum)
And h/o B PE ?Recurrent, unk cause. ?Cont chronic life long Xarelto for the moment. ?Fall risk with seizures + Xarelto is of course a concern... ?

## 2021-12-29 NOTE — ED Notes (Signed)
Coffee provided to pt's wife at bedside ?

## 2021-12-29 NOTE — Plan of Care (Signed)
  Problem: Education: Goal: Knowledge of General Education information will improve Description Including pain rating scale, medication(s)/side effects and non-pharmacologic comfort measures Outcome: Progressing   

## 2021-12-29 NOTE — ED Notes (Signed)
EEG technician informed this RN that pt has become agitated as technician attempts to place EEG cords on pt's scalp and is interfering with his plan of care. At bedside, pt seen having repeated intermittent bursts of agitation in which he attempts to move in bed and pull at cords, not fully alert or oriented to situation. Provider made aware of this. Plan to give IV ativan to treat agitation and to continue plan of care.  ?

## 2021-12-29 NOTE — ED Notes (Signed)
Pt had a unwitnessed fall at app 1000 today. Pt states he was getting up to go to the bathroom. Side rails were up and pt tried to exit the bed via the foot area of the bed. Upon arrival into room pt was on the floor trying to get up. Pt denies any injury or pain. No obvious injury noted. Pt was assisted back into bed and full assessment was done and still no injury noted. Pt still denying any pain or discomfort. Pt CAOx4. Male purewick and diaper placed on pt as well as Posey pad under pt. It was explained to pt that he did not need to get up to go to bathroom. Pt stated he understood. Provider notified and came to bedside and assessed pt. ?

## 2021-12-29 NOTE — ED Triage Notes (Signed)
Pt bib gcems from home after witnessed seizure like activity involving full body lasting 3-5 minutes. Upon EMS arrival pt was post ictal A&Ox1. Pt now A&O x4. No hx of seizures. 18 g LAC.  ? ?BP: 132/76, HR 90, Spo2 96% RA, CBG 128 ?

## 2021-12-29 NOTE — ED Notes (Signed)
Neurologist at pt bedside with pt's wife.  ?

## 2021-12-29 NOTE — Assessment & Plan Note (Signed)
Cont home BP meds ?

## 2021-12-30 DIAGNOSIS — Z7901 Long term (current) use of anticoagulants: Secondary | ICD-10-CM | POA: Diagnosis not present

## 2021-12-30 DIAGNOSIS — E119 Type 2 diabetes mellitus without complications: Secondary | ICD-10-CM | POA: Diagnosis present

## 2021-12-30 DIAGNOSIS — Z8249 Family history of ischemic heart disease and other diseases of the circulatory system: Secondary | ICD-10-CM | POA: Diagnosis not present

## 2021-12-30 DIAGNOSIS — Z86711 Personal history of pulmonary embolism: Secondary | ICD-10-CM | POA: Diagnosis not present

## 2021-12-30 DIAGNOSIS — Z8673 Personal history of transient ischemic attack (TIA), and cerebral infarction without residual deficits: Secondary | ICD-10-CM | POA: Diagnosis not present

## 2021-12-30 DIAGNOSIS — G9349 Other encephalopathy: Secondary | ICD-10-CM | POA: Diagnosis present

## 2021-12-30 DIAGNOSIS — I10 Essential (primary) hypertension: Secondary | ICD-10-CM | POA: Diagnosis present

## 2021-12-30 DIAGNOSIS — Z8262 Family history of osteoporosis: Secondary | ICD-10-CM | POA: Diagnosis not present

## 2021-12-30 DIAGNOSIS — Z951 Presence of aortocoronary bypass graft: Secondary | ICD-10-CM | POA: Diagnosis not present

## 2021-12-30 DIAGNOSIS — R509 Fever, unspecified: Secondary | ICD-10-CM | POA: Diagnosis present

## 2021-12-30 DIAGNOSIS — H409 Unspecified glaucoma: Secondary | ICD-10-CM | POA: Diagnosis present

## 2021-12-30 DIAGNOSIS — H5461 Unqualified visual loss, right eye, normal vision left eye: Secondary | ICD-10-CM | POA: Diagnosis present

## 2021-12-30 DIAGNOSIS — G9389 Other specified disorders of brain: Secondary | ICD-10-CM | POA: Diagnosis present

## 2021-12-30 DIAGNOSIS — E7800 Pure hypercholesterolemia, unspecified: Secondary | ICD-10-CM | POA: Diagnosis present

## 2021-12-30 DIAGNOSIS — Z79899 Other long term (current) drug therapy: Secondary | ICD-10-CM | POA: Diagnosis not present

## 2021-12-30 DIAGNOSIS — Z86718 Personal history of other venous thrombosis and embolism: Secondary | ICD-10-CM | POA: Diagnosis not present

## 2021-12-30 DIAGNOSIS — R569 Unspecified convulsions: Secondary | ICD-10-CM | POA: Diagnosis present

## 2021-12-30 DIAGNOSIS — M109 Gout, unspecified: Secondary | ICD-10-CM | POA: Diagnosis present

## 2021-12-30 DIAGNOSIS — G4733 Obstructive sleep apnea (adult) (pediatric): Secondary | ICD-10-CM | POA: Diagnosis present

## 2021-12-30 DIAGNOSIS — Z96653 Presence of artificial knee joint, bilateral: Secondary | ICD-10-CM | POA: Diagnosis present

## 2021-12-30 DIAGNOSIS — R1110 Vomiting, unspecified: Secondary | ICD-10-CM | POA: Diagnosis present

## 2021-12-30 DIAGNOSIS — T8450XD Infection and inflammatory reaction due to unspecified internal joint prosthesis, subsequent encounter: Secondary | ICD-10-CM | POA: Diagnosis not present

## 2021-12-30 MED ORDER — NYSTATIN 100000 UNIT/ML MT SUSP
5.0000 mL | Freq: Four times a day (QID) | OROMUCOSAL | Status: DC
  Administered 2021-12-30 – 2021-12-31 (×6): 500000 [IU] via ORAL
  Filled 2021-12-30 (×6): qty 5

## 2021-12-30 NOTE — Progress Notes (Signed)
? ?PROGRESS NOTE ? ?Patrick Fritz. QHU:[id number] DOB: 07-Sep-1951 DOA: 12/29/2021 ?PCP: Mayra Neer, MD ? ?HPI/Recap of past 24 hours: ?Patrick Fritz. is a 71 y.o. 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  Old frontal stroke on MRI in April 2022 (remote on MRI at that time).  Pt presents to ED with seizure like activity at home.  Had a recurrent seizure while in the ED.  He received IV Ativan and IV Keppra load.  Currently on IV Keppra twice daily.  EEG was negative.  MRI brain is pending. ? ?12/30/2021: Patient was seen and examined at his bedside.  At the time of this visit he is alert and oriented x3.  Denies having any pain.  No prior history of seizure disorder. ? ? ?Assessment/Plan: ?Principal Problem: ?  Seizure ([place]) ?Active Problems: ?  History of DVT in adulthood ?  Hypertension ?  Infection of total knee replacement ([place]) ? ?New onset seizure ([place]) ?2 seizures, 1 witnessed in ED, tonic-clonic. ?Remote stroke ?Seen by neurology. ?Continue seizure precautions ?Continue AED as recommended by neurology. ?IV Ativan as needed for breakthrough seizures. ?MRI brain is pending. ?  ?History of DVT/bilateral pulmonary embolism. ?Recurrent, unk cause. ?Cont chronic life long Xarelto for the moment. ?Continue fall precautions while on oral anticoagulation. ?  ?Infection of total knee replacement ([place]) ?Continue chronic doxycycline ?Currently afebrile with no leukocytosis. ?  ?Hypertension ?Cont home BP meds ?Continue to closely monitor vital signs. ?  ?  ? Advance Care Planning:   Code Status: Full Code ?  ?Consults: Neuro ?  ?Family Communication: None at bedside. ?  ? ? ?Status is: Inpatient status.  Patient requires at least 2 midnights for further evaluation and treatment of present condition. ? ? ? ?Objective: ?Vitals:  ? 12/29/21 2329 12/30/21 0304 12/30/21 4656 12/30/21 1303  ?BP: 132/70 106/65 114/67 132/81  ?Pulse: 80 79 79 80  ?Resp: '18 16 16 17  '$ ?Temp: 98.7 ?F  (37.1 ?C) 98.8 ?F (37.1 ?C) 98.2 ?F (36.8 ?C) 98.9 ?F (37.2 ?C)  ?TempSrc: Oral Oral Oral Oral  ?SpO2: 100% 94% 94% 98%  ?Weight:      ?Height:      ? ? ?Intake/Output Summary (Last 24 hours) at 12/30/2021 1310 ?Last data filed at 12/30/2021 1042 ?Gross per 24 hour  ?Intake 2787.32 ml  ?Output 1701 ml  ?Net 1086.32 ml  ? ?Filed Weights  ? 12/29/21 0123  ?Weight: 102.1 kg  ? ? ?Exam: ? ?General: 71 y.o. year-old male well developed well nourished in no acute distress.  Alert and oriented x3. ?Cardiovascular: Regular rate and rhythm with no rubs or gallops.  No thyromegaly or JVD noted.   ?Respiratory: Clear to auscultation with no wheezes or rales. Good inspiratory effort. ?Abdomen: Soft nontender nondistended with normal bowel sounds x4 quadrants. ?Musculoskeletal: Trace lower extremity edema bilaterally. ?Skin: No ulcerative lesions noted or rashes ?Psychiatry: Mood is appropriate for condition and setting ? ? ?Data Reviewed: ?CBC: ?Recent Labs  ?Lab 12/29/21 ?0201 12/29/21 ?0220  ?WBC 5.8  --   ?NEUTROABS 3.8  --   ?HGB 11.1* 11.9*  ?HCT 35.8* 35.0*  ?MCV 89.3  --   ?PLT 212  --   ? ?Basic Metabolic Panel: ?Recent Labs  ?Lab 12/29/21 ?0201 12/29/21 ?0220  ?NA 136 139  ?K 4.3 4.2  ?CL 103 105  ?CO2 20*  --   ?GLUCOSE 109* 105*  ?BUN 18 21  ?CREATININE 0.98 1.00  ?  CALCIUM 9.1  --   ? ?GFR: ?Estimated Creatinine Clearance: 79.9 mL/min (by C-G formula based on SCr of 1 mg/dL). ?Liver Function Tests: ?Recent Labs  ?Lab 12/29/21 ?0201  ?AST 28  ?ALT 14  ?ALKPHOS 80  ?BILITOT 0.9  ?PROT 6.8  ?ALBUMIN 3.5  ? ?No results for input(s): LIPASE, AMYLASE in the last 168 hours. ?No results for input(s): AMMONIA in the last 168 hours. ?Coagulation Profile: ?Recent Labs  ?Lab 12/29/21 ?0201  ?INR 1.3*  ? ?Cardiac Enzymes: ?No results for input(s): CKTOTAL, CKMB, CKMBINDEX, TROPONINI in the last 168 hours. ?BNP (last 3 results) ?No results for input(s): PROBNP in the last 8760 hours. ?HbA1C: ?No results for input(s): HGBA1C in the  last 72 hours. ?CBG: ?Recent Labs  ?Lab 12/29/21 ?0134  ?GLUCAP 122*  ? ?Lipid Profile: ?No results for input(s): CHOL, HDL, LDLCALC, TRIG, CHOLHDL, LDLDIRECT in the last 72 hours. ?Thyroid Function Tests: ?No results for input(s): TSH, T4TOTAL, FREET4, T3FREE, THYROIDAB in the last 72 hours. ?Anemia Panel: ?No results for input(s): VITAMINB12, FOLATE, FERRITIN, TIBC, IRON, RETICCTPCT in the last 72 hours. ?Urine analysis: ?   ?Component Value Date/Time  ? COLORURINE STRAW (A) 12/29/2021 0900  ? APPEARANCEUR CLEAR 12/29/2021 0900  ? LABSPEC 1.012 12/29/2021 0900  ? PHURINE 5.0 12/29/2021 0900  ? GLUCOSEU >=500 (A) 12/29/2021 0900  ? HGBUR NEGATIVE 12/29/2021 0900  ? BILIRUBINUR NEGATIVE 12/29/2021 0900  ? KETONESUR NEGATIVE 12/29/2021 0900  ? PROTEINUR NEGATIVE 12/29/2021 0900  ? NITRITE NEGATIVE 12/29/2021 0900  ? LEUKOCYTESUR NEGATIVE 12/29/2021 0900  ? ?Sepsis Labs: ?'@LABRCNTIP'$ (procalcitonin:4,lacticidven:4) ? ?)No results found for this or any previous visit (from the past 240 hour(s)).  ? ? ?Studies: ?No results found. ? ?Scheduled Meds: ? allopurinol  300 mg Oral Daily  ? carvedilol  3.125 mg Oral BID WC  ? dapagliflozin propanediol  10 mg Oral QAC breakfast  ? docusate sodium  100 mg Oral QHS  ? doxycycline  50 mg Oral BID  ? losartan  25 mg Oral Daily  ? nystatin  5 mL Oral QID  ? rivaroxaban  20 mg Oral QHS  ? sertraline  150 mg Oral Daily  ? simvastatin  40 mg Oral QHS  ? spironolactone  12.5 mg Oral Daily  ? ? ?Continuous Infusions: ? sodium chloride 125 mL/hr at 12/30/21 0554  ? levETIRAcetam 500 mg (12/30/21 1042)  ? ? ? LOS: 0 days  ? ? ? ?Kayleen Memos, MD ?[facility] ?Pager [phone] ? ?If 7PM-7AM, please contact night-coverage ?[email or web address] ?Password [email or web address] ?12/30/2021, 1:10 PM  ?  ?

## 2021-12-30 NOTE — Evaluation (Signed)
Occupational Therapy Evaluation ?Patient Details ?Name: Patrick Fritz. ?MRN: [id number] ?DOB: Apr 28, 1951 ?Today's Date: 12/30/2021 ? ? ?History of Present Illness Pt is a 71 y/o male admitted secondary to seizures. PMH includes bilateral TKA (R redone in 12/22), DVT, HTN, CVA (R frontal April 22)and glaucoma.  ? ?Clinical Impression ?  ?PTA patient reports independent and driving. Admitted for above and presents with problem list below, including impaired cognition, balance, coordination and safety.  He currently requires up to min assist for transfer and mobility, setup for seated UB ADLs and min-mod for LB ADLs.  Noted decreased coordination of B Ues with LB dressing, dropping socks 3x before successfully donning. Pt initially disoriented to day of the week, but able to recall after re-orientation. He demonstrates slow processing, decreased problem solving and decreased safety awareness.  Based on performance today, believe he will benefit from continued OT services while admitted and after dc at [facility] level.  Will follow acutely.  ?   ? ?Recommendations for follow up therapy are one component of a multi-disciplinary discharge planning process, led by the attending physician.  Recommendations may be updated based on patient status, additional functional criteria and insurance authorization.  ? ?Follow Up Recommendations ? Home health OT  ?  ?Assistance Recommended at Discharge Frequent or constant Supervision/Assistance  ?Patient can return home with the following A little help with walking and/or transfers;A little help with bathing/dressing/bathroom;Assistance with cooking/housework;Direct supervision/assist for medications management;Direct supervision/assist for financial management;Help with stairs or ramp for entrance;Assist for transportation ? ?  ?Functional Status Assessment ? Patient has had a recent decline in their functional status and demonstrates the ability to make significant improvements in  function in a reasonable and predictable amount of time.  ?Equipment Recommendations ? BSC/3in1  ?  ?Recommendations for Other Services   ? ? ?  ?Precautions / Restrictions Precautions ?Precautions: Fall ?Restrictions ?Weight Bearing Restrictions: No  ? ?  ? ?Mobility Bed Mobility ?Overal bed mobility: Needs Assistance ?Bed Mobility: Supine to Sit, Sit to Supine ?  ?  ?Supine to sit: Min assist ?Sit to supine: Min guard ?  ?General bed mobility comments: Min A for trunk elevation to come to sitting. ?  ? ?Transfers ?Overall transfer level: Needs assistance ?Equipment used: 2 person hand held assist ?Transfers: Sit to/from Stand ?Sit to Stand: Min assist, +2 safety/equipment ?  ?  ?  ?  ?  ?General transfer comment: Min A for lift assist and steadying assist. Anterior lean noted initially, but able to correct with cues. ?  ? ?  ?Balance Overall balance assessment: Needs assistance ?Sitting-balance support: No upper extremity supported, Feet supported ?Sitting balance-Leahy Scale: Fair ?  ?  ?Standing balance support: Bilateral upper extremity supported, During functional activity ?Standing balance-Leahy Scale: Poor ?Standing balance comment: Reliant on BUE support ?  ?  ?  ?  ?  ?  ?  ?  ?  ?  ?  ?   ? ?ADL either performed or assessed with clinical judgement  ? ?ADL Overall ADL's : Needs assistance/impaired ?  ?  ?Grooming: Set up;Sitting ?  ?  ?  ?  ?  ?Upper Body Dressing : Set up;Sitting ?  ?Lower Body Dressing: Minimal assistance;+2 for safety/equipment;Sit to/from stand ?  ?Toilet Transfer: Minimal assistance;+2 for safety/equipment;Ambulation ?Toilet Transfer Details (indicate cue type and reason): bil hand held support- would benefit from RW ?  ?  ?  ?  ?Functional mobility during ADLs: Minimal assistance;+2 for safety/equipment;Cueing for safety;Cueing  for sequencing ?General ADL Comments: pt with forward lean dynamically, cueing to correct  ? ? ? ?Vision   ?Additional Comments: tracking appears WFL, pt  denies deficits or changes  ?   ?Perception   ?  ?Praxis   ?  ? ?Pertinent Vitals/Pain Pain Assessment ?Pain Assessment: No/denies pain  ? ? ? ?Hand Dominance Right ?  ?Extremity/Trunk Assessment Upper Extremity Assessment ?Upper Extremity Assessment: Generalized weakness (noted decreased coordination functionally, dropping socks 3x before able to manage donning) ?  ?Lower Extremity Assessment ?Lower Extremity Assessment: Defer to PT evaluation ?RLE Deficits / Details: Noted RLE at grossly 3+/5. Did have knee replacement in 12/22. ?  ?Cervical / Trunk Assessment ?Cervical / Trunk Assessment: Normal ?  ?Communication Communication ?Communication: No difficulties ?  ?Cognition Arousal/Alertness: Awake/alert ?Behavior During Therapy: Flat affect ?Overall Cognitive Status: No family/caregiver present to determine baseline cognitive functioning ?Area of Impairment: Orientation, Following commands, Problem solving, Awareness, Safety/judgement ?  ?  ?  ?  ?  ?  ?  ?  ?Orientation Level: Disoriented to, Time ?  ?  ?Following Commands: Follows one step commands consistently, Follows one step commands with increased time, Follows multi-step commands inconsistently ?Safety/Judgement: Decreased awareness of safety, Decreased awareness of deficits ?Awareness: Emergent ?Problem Solving: Slow processing, Difficulty sequencing, Requires verbal cues ?General Comments: Unable to report what day of the week. After cues, pt was able to remember it was Friday at end of session. Slow processing and decreased safety awareness. ?  ?  ?General Comments  no family present ? ?  ?Exercises   ?  ?Shoulder Instructions    ? ? ?Home Living Family/patient expects to be discharged to:: Private residence ?Living Arrangements: Spouse/significant other ?Available Help at Discharge: Available 24 hours/day ?Type of Home: House ?Home Access: Stairs to enter ?Entrance Stairs-Number of Steps: 4 ?Entrance Stairs-Rails: None ?Home Layout: One level ?  ?   ?Bathroom Shower/Tub: Tub/shower unit;Curtain ?  ?Bathroom Toilet: Standard ?  ?  ?Home Equipment: Wheelchair - manual ?  ?  ?  ? ?  ?Prior Functioning/Environment Prior Level of Function : Independent/Modified Independent;Driving ?He reports spouse assist with med mgmt, but he drives.  ?  ?  ?  ?  ?  ?  ?  ?  ?  ? ?  ?  ?OT Problem List: Decreased strength;Decreased activity tolerance;Impaired balance (sitting and/or standing);Decreased coordination;Decreased cognition;Decreased knowledge of use of DME or AE;Decreased safety awareness;Decreased knowledge of precautions ?  ?   ?OT Treatment/Interventions: Self-care/ADL training;Therapeutic exercise;DME and/or AE instruction;Patient/family education;Balance training;Cognitive remediation/compensation;Therapeutic activities  ?  ?OT Goals(Current goals can be found in the care plan section) Acute Rehab OT Goals ?Patient Stated Goal: home ?OT Goal Formulation: With patient ?Time For Goal Achievement: 01/13/22 ?Potential to Achieve Goals: Good  ?OT Frequency: Min 2X/week ?  ? ?Co-evaluation PT/OT/SLP Co-Evaluation/Treatment: Yes ?Reason for Co-Treatment: For patient/therapist safety;To address functional/ADL transfers ?PT goals addressed during session: Mobility/safety with mobility;Balance ?OT goals addressed during session: ADL's and self-care ?  ? ?  ?AM-PAC OT "6 Clicks" Daily Activity     ?Outcome Measure Help from another person eating meals?: A Little ?Help from another person taking care of personal grooming?: A Little ?Help from another person toileting, which includes using toliet, bedpan, or urinal?: A Lot ?Help from another person bathing (including washing, rinsing, drying)?: A Little ?Help from another person to put on and taking off regular upper body clothing?: A Little ?Help from another person to put on and taking off regular  lower body clothing?: A Little ?6 Click Score: 17 ?  ?End of Session Equipment Utilized During Treatment: Gait belt ?Nurse  Communication: Mobility status ? ?Activity Tolerance: Patient tolerated treatment well ?Patient left: in bed;with call bell/phone within reach;with bed alarm set ? ?OT Visit Diagnosis: Other abnormalities of gait and mo

## 2021-12-30 NOTE — Plan of Care (Signed)
?  Problem: Education: ?Goal: Knowledge of General Education information will improve ?Description: Including pain rating scale, medication(s)/side effects and non-pharmacologic comfort measures ?Outcome: Progressing ?  ?Problem: Activity: ?Goal: Risk for activity intolerance will decrease ?Outcome: Progressing ?  ?Problem: Nutrition: ?Goal: Adequate nutrition will be maintained ?Outcome: Progressing ?  ?Problem: Coping: ?Goal: Level of anxiety will decrease ?Outcome: Progressing ?  ?Problem: Skin Integrity: ?Goal: Risk for impaired skin integrity will decrease ?Outcome: Progressing ?  ?Problem: Education: ?Goal: Expressions of having a comfortable level of knowledge regarding the disease process will increase ?Outcome: Progressing ?  ?Problem: Coping: ?Goal: Ability to adjust to condition or change in health will improve ?Outcome: Progressing ?  ?

## 2021-12-30 NOTE — Progress Notes (Signed)
NEUROLOGY CONSULTATION PROGRESS NOTE  ? ?Date of service: December 30, 2021 ?Patient Name: Patrick Fritz. ?MRN:  [id number] ?DOB:  1951-03-08 ? ?Brief HPI  ?Patrick Fritz. is a 71 y.o. 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 ?  ?Interval Hx  ? ?Patient has not been able to tolerate getting an MRI secondary to encephalopathy. However, patient with significantly improved mental status this morning.  He is fully alert and oriented with intact attention.  He denies any physical complaints. ? ?Vitals  ? ?Vitals:  ? 12/29/21 2008 12/29/21 2329 12/30/21 0304 12/30/21 5456  ?BP: 132/71 132/70 106/65 114/67  ?Pulse: 73 80 79 79  ?Resp: '16 18 16 16  '$ ?Temp: 98.5 ?F (36.9 ?C) 98.7 ?F (37.1 ?C) 98.8 ?F (37.1 ?C) 98.2 ?F (36.8 ?C)  ?TempSrc:  Oral Oral Oral  ?SpO2: 100% 100% 94% 94%  ?Weight:      ?Height:      ?  ? ?Body mass index is 32.28 kg/m?. ? ?Physical Exam  ?General: Lying comfortably in bed; in no acute distress.  ?HENT: Normal oropharynx and mucosa. Normal external appearance of ears and nose.  ?Neck: Supple, no pain or tenderness  ?CV: No peripheral edema.  ?Pulmonary: Symmetric Chest rise. Normal respiratory effort.  ?Ext: No cyanosis, edema, or deformity  ?Skin: No rash. Normal palpation of skin.   ?  ?  ?Neurologic Examination  ?Mental status/Cognition: Alert, oriented to self, place, month and year, good attention.  ?Speech/language: Fluent, comprehension intact. ?Cranial nerves:  ? CN II Pupils equal and reactive to light, no VF deficits   ? CN III,IV,VI EOM intact, no gaze preference or deviation, no nystagmus   ? CN V normal sensation in V1, V2, and V3 segments bilaterally   ? CN VII no asymmetry, no nasolabial fold flattening   ? CN VIII normal hearing to speech   ? CN  IX & X normal palatal elevation, no uvular deviation   ? CN XI 5/5 head turn and 5/5 shoulder shrug bilaterally   ? CN XII midline tongue protrusion   ?  ?Motor:  ?Mvmt Root Nerve  Muscle Right Left Comments  ?SA C5/6 Ax Deltoid 5/5 5/5    ?EF C5/6 Mc Biceps 5/5 5/5    ?EE C6/7/8 Rad Triceps 5/5 5/5    ?WF C6/7 Med FCR        ?WE C7/8 PIN ECU        ?F Ab C8/T1 U ADM/FDI        ?HF L1/2/3 Fem Illopsoas 5/5 5/5    ?KE L2/3/4 Fem Quad  4/5 5/5 Patient with previous R knee replacement   ?DF L4/5 D Peron Tib Ant        ?PF S1/2 Tibial Grc/Sol        ? ?Sensation: ? Light touch intact  ? Pin prick    ? Temperature    ? Vibration    ?Proprioception    ?  ?Coordination/Complex Motor:  ?- Finger to Nose intact ?- Heel to shin intact ?- Gait: deferred ? ? ?Labs  ? ?Basic Metabolic Panel:  ?Lab Results  ?Component Value Date  ? NA 139 12/29/2021  ? K 4.2 12/29/2021  ? CO2 20 (L) 12/29/2021  ? GLUCOSE 105 (H) 12/29/2021  ? BUN 21 12/29/2021  ?  CREATININE 1.00 12/29/2021  ? CALCIUM 9.1 12/29/2021  ? GFRNONAA >60 12/29/2021  ? GFRAA 99 10/21/2020  ? ?HbA1c:  ?Lab Results  ?Component Value Date  ? HGBA1C 6.3 (H) 05/24/2021  ? ?LDL:  ?Lab Results  ?Component Value Date  ? LDLCALC UNABLE TO CALCULATE IF TRIGLYCERIDE OVER 400 mg/dL 01/22/2021  ? ?Urine Drug Screen:  ?   ?Component Value Date/Time  ? LABOPIA NONE DETECTED 12/29/2021 0900  ? COCAINSCRNUR NONE DETECTED 12/29/2021 0900  ? LABBENZ NONE DETECTED 12/29/2021 0900  ? AMPHETMU NONE DETECTED 12/29/2021 0900  ? THCU NONE DETECTED 12/29/2021 0900  ? LABBARB NONE DETECTED 12/29/2021 0900  ?  ?Alcohol Level  ?   ?Component Value Date/Time  ? ETH <10 12/29/2021 0201  ? ?No results found for: PHENYTOIN, ZONISAMIDE, LAMOTRIGINE, LEVETIRACETA ?No results found for: PHENYTOIN, PHENOBARB, VALPROATE, CBMZ ? ?Imaging and Diagnostic studies  ? ?CT Head without contrast: ?CTH was negative for a large hypodensity concerning for a large territory infarct or hyperdensity concerning for an ICH.  He does have an old chronic R frontal lobe infarct. ?  ?MRI Brain: ?pending ?  ?rEEG:  ?Negative for seizure ? ? ?Impression  ? ?Patrick Fritz. is a 71 y.o. 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  ? ?Patient's somnolence has now resolved and he is fully alert and oriented with intact attention. His chronic infarct likely represents a seizure focus.  Given this, recommend that the patient continue on Keppra 500 mg twice daily as an outpatient until neurology follow-up. Will need MRI-Brain before D/C.  ? ?Recommendations  ? ?-MRI-Brain ?-Continue Keppra 500 mg BID until outpatient follow-up ?-No driving for 6 months. The patient and his wife were informed of this at bedside by the attending neurologist. ?-Discharge precautions listed below ? ?Corky Sox, MD ?PGY-1 ? ?I have seen the patient reviewed the above note.  Patient has had seizure in the setting of cortical encephalomalacia and therefore at high risk of recurrence.  He will need continued antiepileptic therapy.  He still has a pending MRI, but if this is negative then there be no further neurological testing. ? ? ?Seizure precautions: ?Per [facility] statutes, patients with seizures are not allowed to drive until they have been seizure-free for six months and cleared by a physician  ?  ?Use caution when using heavy equipment or power tools. Avoid working on ladders or at heights. Take showers instead of baths. Ensure the water temperature is not too high on the home water heater. Do not go swimming alone. Do not lock yourself in a room alone (i.e. bathroom). When caring for infants or small children, sit down when holding, feeding, or changing them to minimize risk of injury to the child in the event you  have a seizure. Maintain good sleep hygiene. Avoid alcohol.  ?  ?If patient has another seizure, call 911 and bring them back to the ED if: ?A.  The seizure lasts longer than 5 minutes.      ?B.  The patient doesn't wake shortly after the seizure or has new problems such as difficulty seeing, speaking or moving following the seizure ?C.  The patient was injured during the seizure ?D.  The patient has a temperature over 102 F (39C) ?E.  The patient vomited during the seizure and  now is having trouble breathing ?   ?During the Seizure ?  ?- First, ensure adequate ventilation and place patients on the floor on their left side  ?Loosen clothing around the neck and ensure the airway is patent. If the patient is clenching the teeth, do not force the mouth open with any object as this can cause severe damage ?- Remove all items from the surrounding that can be hazardous. The patient may be oblivious to what's happening and may not even know what he or she is doing. ?If the patient is confused and wandering, either gently guide him/her away and block access to outside areas ?- Reassure the individual and be comforting ?- Call 911. In most cases, the seizure ends before EMS arrives. However, there are cases when seizures may last over 3 to 5 minutes. Or the individual may have developed breathing difficulties or severe injuries. If a pregnant patient or a person with diabetes develops a seizure, it is prudent to call an ambulance. ?- Finally, if the patient does not regain full consciousness, then call EMS. Most patients will remain confused for about 45 to 90 minutes after a seizure, so you must use judgment in calling for help. ?- Avoid restraints but make sure the patient is in a bed with padded side rails ?- Place the individual in a lateral position with the neck slightly flexed; this will help the saliva drain from the mouth and prevent the tongue from falling backward ?- Remove all nearby furniture and other hazards  from the area ?- Provide verbal assurance as the individual is regaining consciousness ?- Provide the patient with privacy if possible ?- Call for help and start treatment as ordered by the caregiver ?

## 2021-12-30 NOTE — Evaluation (Signed)
Physical Therapy Evaluation ?Patient Details ?Name: Patrick Fritz. ?MRN: [id number] ?DOB: 26-Jan-1951 ?Today's Date: 12/30/2021 ? ?History of Present Illness ? Pt is a 71 y/o male admitted secondary to seizures. PMH includes bilateral TKA (R redone in 12/22), DVT, HTN, and glaucoma.  ?Clinical Impression ? Pt admitted secondary to problem above with deficits below. Pt requiring min A for mobility tasks. Demonstrated anterior lean when ambulating short distance in room. Educated about using RW at home to increase safety. Anticipate pt will progress well once symptoms improve. If pt continues to progress well, would benefit from HHPT. Will continue to follow acutely.    ?   ? ?Recommendations for follow up therapy are one component of a multi-disciplinary discharge planning process, led by the attending physician.  Recommendations may be updated based on patient status, additional functional criteria and insurance authorization. ? ?Follow Up Recommendations Home health PT (pending progression) ? ?  ?Assistance Recommended at Discharge Frequent or constant Supervision/Assistance  ?Patient can return home with the following ? A little help with walking and/or transfers;A little help with bathing/dressing/bathroom;Help with stairs or ramp for entrance;Assist for transportation;Assistance with cooking/housework;Direct supervision/assist for financial management;Direct supervision/assist for medications management ? ?  ?Equipment Recommendations Rolling walker (2 wheels)  ?Recommendations for Other Services ?    ?  ?Functional Status Assessment Patient has had a recent decline in their functional status and demonstrates the ability to make significant improvements in function in a reasonable and predictable amount of time.  ? ?  ?Precautions / Restrictions Precautions ?Precautions: Fall ?Restrictions ?Weight Bearing Restrictions: No  ? ?  ? ?Mobility ? Bed Mobility ?Overal bed mobility: Needs Assistance ?Bed Mobility:  Supine to Sit, Sit to Supine ?  ?  ?Supine to sit: Min assist ?Sit to supine: Min guard ?  ?General bed mobility comments: Min A for trunk elevation to come to sitting. ?  ? ?Transfers ?Overall transfer level: Needs assistance ?Equipment used: 2 person hand held assist ?Transfers: Sit to/from Stand ?Sit to Stand: Min assist, +2 safety/equipment ?  ?  ?  ?  ?  ?General transfer comment: Min A for lift assist and steadying assist. Anterior lean noted initially, but able to correct with cues. ?  ? ?Ambulation/Gait ?Ambulation/Gait assistance: Min assist, +2 physical assistance, +2 safety/equipment ?Gait Distance (Feet): 5 Feet ?Assistive device: 2 person hand held assist ?Gait Pattern/deviations: Step-through pattern, Decreased stride length ?Gait velocity: Decreased ?  ?  ?General Gait Details: Pt with anterior lean and LOB when taking steps forwards. Required cues for upright. Min A for steadying. ? ?Stairs ?  ?  ?  ?  ?  ? ?Wheelchair Mobility ?  ? ?Modified Rankin (Stroke Patients Only) ?  ? ?  ? ?Balance Overall balance assessment: Needs assistance ?Sitting-balance support: No upper extremity supported, Feet supported ?Sitting balance-Leahy Scale: Fair ?  ?  ?Standing balance support: Bilateral upper extremity supported ?Standing balance-Leahy Scale: Poor ?Standing balance comment: Reliant on BUE support ?  ?  ?  ?  ?  ?  ?  ?  ?  ?  ?  ?   ? ? ? ?Pertinent Vitals/Pain Pain Assessment ?Pain Assessment: No/denies pain  ? ? ?Home Living Family/patient expects to be discharged to:: Private residence ?Living Arrangements: Spouse/significant other ?Available Help at Discharge: Available 24 hours/day ?Type of Home: House ?Home Access: Stairs to enter ?Entrance Stairs-Rails: None ?Entrance Stairs-Number of Steps: 4 ?  ?Home Layout: One level ?Home Equipment: Wheelchair - manual ?   ?  ?  Prior Function Prior Level of Function : Independent/Modified Independent;Driving ?  ?  ?  ?  ?  ?  ?  ?  ?  ? ? ?Hand Dominance  ?   ? ?   ?Extremity/Trunk Assessment  ? Upper Extremity Assessment ?Upper Extremity Assessment: Defer to OT evaluation ?  ? ?Lower Extremity Assessment ?Lower Extremity Assessment: Generalized weakness;RLE deficits/detail ?RLE Deficits / Details: Noted RLE at grossly 3+/5. Did have knee replacement in 12/22. ?  ? ?Cervical / Trunk Assessment ?Cervical / Trunk Assessment: Normal  ?Communication  ? Communication: No difficulties  ?Cognition Arousal/Alertness: Awake/alert ?Behavior During Therapy: Flat affect ?Overall Cognitive Status: No family/caregiver present to determine baseline cognitive functioning ?  ?  ?  ?  ?  ?  ?  ?  ?  ?  ?  ?  ?  ?  ?  ?  ?General Comments: Unable to report what day of the week. After cues, pt was able to remember it was Friday at end of session. Slow processing and decreased safety awareness. ?  ?  ? ?  ?General Comments General comments (skin integrity, edema, etc.): No family present ? ?  ?Exercises    ? ?Assessment/Plan  ?  ?PT Assessment Patient needs continued PT services  ?PT Problem List Decreased strength;Decreased balance;Decreased activity tolerance;Decreased mobility;Decreased cognition;Decreased knowledge of use of DME;Decreased safety awareness;Decreased knowledge of precautions ? ?   ?  ?PT Treatment Interventions DME instruction;Gait training;Stair training;Therapeutic activities;Functional mobility training;Therapeutic exercise;Balance training;Patient/family education   ? ?PT Goals (Current goals can be found in the Care Plan section)  ?Acute Rehab PT Goals ?Patient Stated Goal: none stated ?PT Goal Formulation: With patient ?Time For Goal Achievement: 01/13/22 ?Potential to Achieve Goals: Good ? ?  ?Frequency Min 3X/week ?  ? ? ?Co-evaluation PT/OT/SLP Co-Evaluation/Treatment: Yes ?Reason for Co-Treatment: For patient/therapist safety;To address functional/ADL transfers ?PT goals addressed during session: Mobility/safety with mobility;Balance ?  ?  ? ? ?  ?AM-PAC PT "6 Clicks"  Mobility  ?Outcome Measure Help needed turning from your back to your side while in a flat bed without using bedrails?: A Little ?Help needed moving from lying on your back to sitting on the side of a flat bed without using bedrails?: A Little ?Help needed moving to and from a bed to a chair (including a wheelchair)?: A Little ?Help needed standing up from a chair using your arms (e.g., wheelchair or bedside chair)?: A Little ?Help needed to walk in hospital room?: A Lot ?Help needed climbing 3-5 steps with a railing? : A Lot ?6 Click Score: 16 ? ?  ?End of Session Equipment Utilized During Treatment: Gait belt ?Activity Tolerance: Patient tolerated treatment well ?Patient left: in bed;with call bell/phone within reach;with bed alarm set ?Nurse Communication: Mobility status ?PT Visit Diagnosis: Unsteadiness on feet (R26.81);Muscle weakness (generalized) (M62.81) ?  ? ?Time: 9024-0973 ?PT Time Calculation (min) (ACUTE ONLY): 15 min ? ? ?Charges:   PT Evaluation ?$PT Eval Moderate Complexity: 1 Mod ?  ?  ?   ? ? ?Reuel Derby, PT, DPT  ?Acute Rehabilitation Services  ?Pager: [phone] ?Office: [phone] ? ? ?[place] ?12/30/2021, 9:03 AM ? ?

## 2021-12-30 NOTE — Plan of Care (Signed)

## 2021-12-30 NOTE — TOC Initial Note (Signed)
Transition of Care (TOC) - Initial/Assessment Note  ? ? ?Patient Details  ?Name: Patrick Fritz. ?MRN: [id number] ?Date of Birth: [birth year] ? ?Transition of Care (TOC) CM/SW Contact:    ?Pollie Friar, RN ?Phone Number: ?12/30/2021, 2:46 PM ? ?Clinical Narrative:                 ?Patient is from home with his spouse. He has someone with almost 24/7. Patient sleepy during Cm visit so spoke mainly with wife at the bedside. She denies any issues with home medications or transportation.  ?Recommendations for home health. Wife states they have used [facility] in the past but cant remember the agency. She asked to use who ever they used last. CM found they have used [facility] most recently. Tommi Rumps with [facility] accepted the referral. Information on the AVS. ?Wife is able to provide needed transport home when discharged.  ? ?Expected Discharge Plan: [place] ?Barriers to Discharge: Continued Medical Work up ? ? ?Patient Goals and CMS Choice ?  ?CMS Medicare.gov Compare Post Acute Care list provided to:: Patient Represenative (must comment) ?Choice offered to / list presented to : Spouse ? ?Expected Discharge Plan and Services ?Expected Discharge Plan: [place] ?  ?Discharge Planning Services: CM Consult ?  ?  ?                ?  ?  ?  ?  ?  ?HH Arranged: PT, OT ?[place] Agency: [facility] ?Date HH Agency Contacted: 12/30/21 ?  ?Representative spoke with at [facility]: Tommi Rumps ? ?Prior Living Arrangements/Services ?  ?  ?Patient language and need for interpreter reviewed:: Yes ?Do you feel safe going back to the place where you live?: Yes      ?Need for Family Participation in Patient Care: Yes (Comment) ?Care giver support system in place?: Yes (comment) ?Current home services: DME (wheelchair/ walker/ shower seat) ?Criminal Activity/Legal Involvement Pertinent to Current Situation/Hospitalization: No - Comment as needed ? ?Activities of Daily Living ?Home Assistive Devices/Equipment: Gilford Rile  (specify type), Cane (specify quad or straight), Shower chair without back ?ADL Screening (condition at time of admission) ?Patient's cognitive ability adequate to safely complete daily activities?: No ?Is the patient deaf or have difficulty hearing?: Yes ?Does the patient have difficulty seeing, even when wearing glasses/contacts?: No ?Does the patient have difficulty concentrating, remembering, or making decisions?: No ?Patient able to express need for assistance with ADLs?: Yes ?Does the patient have difficulty dressing or bathing?: Yes ?Independently performs ADLs?: Yes (appropriate for developmental age) ?Does the patient have difficulty walking or climbing stairs?: Yes ?Weakness of Legs: Both ?Weakness of Arms/Hands: None ? ?Permission Sought/Granted ?  ?  ?   ?   ?   ?   ? ?Emotional Assessment ?Appearance:: Appears stated age ?Attitude/Demeanor/Rapport: Lethargic ?Affect (typically observed): Quiet ?Orientation: : Oriented to Self, Oriented to Place ?  ?Psych Involvement: No (comment) ? ?Admission diagnosis:  Seizure ([place]) [R56.9] ?Patient Active Problem List  ? Diagnosis Date Noted  ? Seizure ([place]) 12/29/2021  ? Failed orthopedic implant, initial encounter ([place]) 09/22/2021  ? Infection of total knee replacement ([place])   ? Septic arthritis of knee, right ([place]) 05/24/2021  ? TIA (transient ischemic attack) 01/22/2021  ? Gout 01/22/2021  ? Glaucoma   ? Hyperlipidemia   ? CAD (coronary artery disease)   ? Aortic atherosclerosis ([place])   ? History of DVT in adulthood   ? Hypertension   ?  Bilateral pulmonary embolism ([place]) 07/12/2016  ? S/P CABG x 2 05/22/2016  ? Hx of ascending aorta replacement 05/22/2016  ? Chest pain   ? History of ST elevation myocardial infarction (STEMI)   ? Obstructive sleep apnea 12/16/2013  ? Morbid obesity ([place]) 12/16/2013  ? SOB (shortness of breath) 12/16/2013  ? ?PCP:  Mayra Neer, MD ?Pharmacy:   ?[facility] ([place]), [place] - [address] ?[address] ?[place] [address] ?Phone: [phone] Fax: [phone] ? ? ? ? ?Social Determinants of Health (SDOH) Interventions ?  ? ?Readmission Risk Interventions ?No flowsheet data found. ? ? ?

## 2021-12-31 DIAGNOSIS — R569 Unspecified convulsions: Secondary | ICD-10-CM | POA: Diagnosis not present

## 2021-12-31 MED ORDER — LORAZEPAM 2 MG/ML IJ SOLN: 2.0000 mg | Freq: Once | INTRAMUSCULAR | Status: DC

## 2021-12-31 MED ORDER — LEVETIRACETAM 500 MG PO TABS: 500.0000 mg | ORAL_TABLET | Freq: Two times a day (BID) | ORAL | 0 refills | Status: DC

## 2021-12-31 MED ORDER — LORAZEPAM 2 MG/ML IJ SOLN: 1.0000 mg | Freq: Once | INTRAMUSCULAR | Status: DC

## 2021-12-31 NOTE — Progress Notes (Signed)
Discharge instructions, rx's and follow up appts explained and provided to patient and wife verbalized understanding. Patient left floor via wheelchair accompanied by staff no c/o pain or shortness of breath at d/c. ? ?Dorse Locy, Tivis Ringer, RN ? ?

## 2021-12-31 NOTE — Progress Notes (Signed)
Physical Therapy Treatment ?Patient Details ?Name: Patrick Fritz. ?MRN: [id number] ?DOB: 10-Feb-1951 ?Today's Date: 12/31/2021 ? ? ?History of Present Illness Pt is a 71 y/o male admitted secondary to seizures. PMH includes bilateral TKA (R redone in 12/22), DVT, HTN, CVA (R frontal April 22)and glaucoma. ? ?  ?PT Comments  ? ? Pt is progressing well towards goals. He increased his ambulation distance today with use of RW. Will continue to follow acutely. Current plan remains appropriate.   ?Recommendations for follow up therapy are one component of a multi-disciplinary discharge planning process, led by the attending physician.  Recommendations may be updated based on patient status, additional functional criteria and insurance authorization. ? ?Follow Up Recommendations ? Home health PT (pending progression) ?  ?  ?Assistance Recommended at Discharge Frequent or constant Supervision/Assistance  ?Patient can return home with the following A little help with walking and/or transfers;A little help with bathing/dressing/bathroom;Help with stairs or ramp for entrance;Assist for transportation;Assistance with cooking/housework;Direct supervision/assist for financial management;Direct supervision/assist for medications management ?  ?Equipment Recommendations ? Rolling walker (2 wheels)  ?  ?Recommendations for Other Services   ? ? ?  ?Precautions / Restrictions Precautions ?Precautions: Fall ?Restrictions ?Weight Bearing Restrictions: No  ?  ? ?Mobility ? Bed Mobility ?Overal bed mobility: Needs Assistance ?Bed Mobility: Supine to Sit, Sit to Supine ?  ?  ?Supine to sit: Min guard ?  ?  ?General bed mobility comments: able to come upright with increased time and effort with use of bed rails and HOB elevated ?  ? ?Transfers ?Overall transfer level: Needs assistance ?Equipment used: Rolling walker (2 wheels) ?Transfers: Sit to/from Stand ?Sit to Stand: Min assist ?  ?  ?  ?  ?  ?General transfer comment: light min A to  steady. Cues for hand placement ?  ? ?Ambulation/Gait ?Ambulation/Gait assistance: Min guard ?Gait Distance (Feet): 200 Feet ?Assistive device: Rolling walker (2 wheels) ?Gait Pattern/deviations: Step-through pattern, Decreased stride length, Trunk flexed ?Gait velocity: Decreased ?  ?  ?General Gait Details: pt did well with RW. He has used one with past injuries/surgeries. Cues for RW proximity and postural control. No overt LOB ? ? ?Stairs ?  ?  ?  ?  ?  ? ? ?Wheelchair Mobility ?  ? ?Modified Rankin (Stroke Patients Only) ?  ? ? ?  ?Balance Overall balance assessment: Needs assistance ?Sitting-balance support: No upper extremity supported, Feet supported ?Sitting balance-Leahy Scale: Fair ?  ?  ?Standing balance support: Bilateral upper extremity supported, During functional activity ?Standing balance-Leahy Scale: Poor ?Standing balance comment: Reliant on BUE support ?  ?  ?  ?  ?  ?  ?  ?  ?  ?  ?  ?  ? ?  ?Cognition Arousal/Alertness: Awake/alert ?Behavior During Therapy: [facility] for tasks assessed/performed ?Overall Cognitive Status: No family/caregiver present to determine baseline cognitive functioning ?Area of Impairment: Following commands, Problem solving, Awareness, Safety/judgement, Memory ?  ?  ?  ?  ?  ?  ?  ?  ?  ?  ?Memory: Decreased short-term memory ?Following Commands: Follows one step commands consistently, Follows one step commands with increased time, Follows multi-step commands inconsistently ?Safety/Judgement: Decreased awareness of safety, Decreased awareness of deficits ?Awareness: Emergent ?Problem Solving: Slow processing, Requires verbal cues ?General Comments: pt with short term memory deficits of current hospital stay. Unable to recall room number but able to find room once given number to look for. ?  ?  ? ?  ?Exercises   ? ?  ?  General Comments General comments (skin integrity, edema, etc.): wife present and helpful during session ?  ?  ? ?Pertinent Vitals/Pain Pain Assessment ?Pain  Assessment: No/denies pain  ? ? ?Home Living   ?  ?  ?  ?  ?  ?  ?  ?  ?  ?   ?  ?Prior Function    ?  ?  ?   ? ?PT Goals (current goals can now be found in the care plan section) Acute Rehab PT Goals ?Patient Stated Goal: none stated ?PT Goal Formulation: With patient ?Time For Goal Achievement: 01/13/22 ?Potential to Achieve Goals: Good ?Progress towards PT goals: Progressing toward goals ? ?  ?Frequency ? ? ? Min 3X/week ? ? ? ?  ?PT Plan Current plan remains appropriate  ? ? ?Co-evaluation   ?  ?  ?  ?  ? ?  ?AM-PAC PT "6 Clicks" Mobility   ?Outcome Measure ? Help needed turning from your back to your side while in a flat bed without using bedrails?: A Little ?Help needed moving from lying on your back to sitting on the side of a flat bed without using bedrails?: A Little ?Help needed moving to and from a bed to a chair (including a wheelchair)?: A Little ?Help needed standing up from a chair using your arms (e.g., wheelchair or bedside chair)?: A Little ?Help needed to walk in hospital room?: A Little ?Help needed climbing 3-5 steps with a railing? : A Lot ?6 Click Score: 17 ? ?  ?End of Session Equipment Utilized During Treatment: Gait belt ?Activity Tolerance: Patient tolerated treatment well ?Patient left: with call bell/phone within reach;in chair;with chair alarm set;with family/visitor present ?Nurse Communication: Mobility status ?PT Visit Diagnosis: Unsteadiness on feet (R26.81);Muscle weakness (generalized) (M62.81) ?  ? ? ?Time: 7322-0254 ?PT Time Calculation (min) (ACUTE ONLY): 23 min ? ?Charges:  $Gait Training: 23-37 mins          ?          ? ?Benjiman Core, PTA ?Pager [phone] ?Acute Rehab ? ?Allena Katz ?12/31/2021, 12:56 PM ? ?

## 2021-12-31 NOTE — Plan of Care (Signed)
?  Problem: Education: ?Goal: Knowledge of General Education information will improve ?Description: Including pain rating scale, medication(s)/side effects and non-pharmacologic comfort measures ?12/31/2021 1856 by Emmaline Life, RN ?Outcome: Completed/Met ?12/31/2021 1641 by Emmaline Life, RN ?Outcome: Progressing ?  ?Problem: Health Behavior/Discharge Planning: ?Goal: Ability to manage health-related needs will improve ?12/31/2021 1856 by Emmaline Life, RN ?Outcome: Completed/Met ?12/31/2021 1641 by Emmaline Life, RN ?Outcome: Progressing ?  ?Problem: Clinical Measurements: ?Goal: Ability to maintain clinical measurements within normal limits will improve ?Outcome: Completed/Met ?Goal: Will remain free from infection ?Outcome: Completed/Met ?Goal: Diagnostic test results will improve ?Outcome: Completed/Met ?Goal: Respiratory complications will improve ?Outcome: Completed/Met ?Goal: Cardiovascular complication will be avoided ?Outcome: Completed/Met ?  ?Problem: Activity: ?Goal: Risk for activity intolerance will decrease ?Outcome: Completed/Met ?  ?Problem: Nutrition: ?Goal: Adequate nutrition will be maintained ?Outcome: Completed/Met ?  ?Problem: Coping: ?Goal: Level of anxiety will decrease ?Outcome: Completed/Met ?  ?Problem: Elimination: ?Goal: Will not experience complications related to bowel motility ?Outcome: Completed/Met ?Goal: Will not experience complications related to urinary retention ?Outcome: Completed/Met ?  ?Problem: Pain Managment: ?Goal: General experience of comfort will improve ?Outcome: Completed/Met ?  ?Problem: Safety: ?Goal: Ability to remain free from injury will improve ?Outcome: Completed/Met ?  ?Problem: Skin Integrity: ?Goal: Risk for impaired skin integrity will decrease ?Outcome: Completed/Met ?  ?Problem: Education: ?Goal: Expressions of having a comfortable level of knowledge regarding the disease process will increase ?Outcome: Completed/Met ?  ?Problem:  Coping: ?Goal: Ability to adjust to condition or change in health will improve ?Outcome: Completed/Met ?Goal: Ability to identify appropriate support needs will improve ?Outcome: Completed/Met ?  ?Problem: Health Behavior/Discharge Planning: ?Goal: Compliance with prescribed medication regimen will improve ?Outcome: Completed/Met ?  ?Problem: Medication: ?Goal: Risk for medication side effects will decrease ?Outcome: Completed/Met ?  ?Problem: Clinical Measurements: ?Goal: Complications related to the disease process, condition or treatment will be avoided or minimized ?Outcome: Completed/Met ?Goal: Diagnostic test results will improve ?Outcome: Completed/Met ?  ?Problem: Safety: ?Goal: Verbalization of understanding the information provided will improve ?Outcome: Completed/Met ?  ?Problem: Self-Concept: ?Goal: Level of anxiety will decrease ?Outcome: Completed/Met ?Goal: Ability to verbalize feelings about condition will improve ?Outcome: Completed/Met ?  ?Problem: Acute Rehab PT Goals(only PT should resolve) ?Goal: Pt Will Go Supine/Side To Sit ?Outcome: Adequate for Discharge ?Goal: Pt Will Go Sit To Supine/Side ?Outcome: Adequate for Discharge ?Goal: Patient Will Transfer Sit To/From Stand ?Outcome: Adequate for Discharge ?Goal: Pt Will Ambulate ?Outcome: Adequate for Discharge ?Goal: Pt Will Go Up/Down Stairs ?Outcome: Adequate for Discharge ?  ?Problem: Acute Rehab OT Goals (only OT should resolve) ?Goal: Pt. Will Perform Grooming ?Outcome: Adequate for Discharge ?Goal: Pt. Will Perform Lower Body Dressing ?Outcome: Adequate for Discharge ?Goal: Pt. Will Transfer To Toilet ?Outcome: Adequate for Discharge ?Goal: Pt. Will Perform Toileting-Clothing Manipulation ?Outcome: Adequate for Discharge ?Goal: Pt. Will Perform Tub/Shower Transfer ?Outcome: Adequate for Discharge ?Goal: OT Additional ADL Goal #1 ?Outcome: Adequate for Discharge ?  ?

## 2021-12-31 NOTE — Progress Notes (Signed)
? ?PROGRESS NOTE ? ?Patrick Fritz. GHW:[id number] DOB: Dec 11, 1950 DOA: 12/29/2021 ?PCP: Mayra Neer, MD ? ?HPI/Recap of past 24 hours: ?Patrick Fritz. is a 71 y.o. 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  Old frontal stroke on MRI in April 2022 (remote on MRI at that time).  Pt presents to ED with seizure like activity at home.  Had a recurrent seizure while in the ED.  He received IV Ativan and IV Keppra load.  Currently on IV Keppra twice daily.  EEG was negative.  MRI brain is pending. ? ?12/31/2021: Patient was seen at his bedside.  No new complaints.  MRI brain is pending. ? ? ?Assessment/Plan: ?Principal Problem: ?  Seizure ([place]) ?Active Problems: ?  History of DVT in adulthood ?  Hypertension ?  Infection of total knee replacement ([place]) ? ?New onset seizure ([place]) ?2 seizures, 1 witnessed in ED, tonic-clonic. ?Remote stroke ?Seen by neurology. ?Continue seizure precautions ?Continue AED as recommended by neurology. ?IV Ativan as needed for breakthrough seizures. ?MRI brain is pending. ?  ?History of DVT/bilateral pulmonary embolism. ?Recurrent, unk cause. ?Cont chronic life long Xarelto for the moment. ?Continue fall precautions while on oral anticoagulation. ?  ?Infection of total knee replacement ([place]) ?Continue chronic doxycycline ?Currently afebrile with no leukocytosis. ?  ?Hypertension ?Cont home BP meds ?Continue to closely monitor vital signs. ?  ?  ? Advance Care Planning:   Code Status: Full Code ?  ?Consults: Neuro ?  ?Family Communication: None at bedside. ?  ? ? ?Status is: Inpatient status.  Patient requires at least 2 midnights for further evaluation and treatment of present condition. ? ? ? ?Objective: ?Vitals:  ? 12/30/21 1947 12/30/21 2319 12/31/21 0332 12/31/21 1532  ?BP: (!) 102/53 130/64 128/64 140/81  ?Pulse: 78 74 66 66  ?Resp: '16 18 18 20  '$ ?Temp: 98.7 ?F (37.1 ?C) 98.9 ?F (37.2 ?C) 98.7 ?F (37.1 ?C) 98.7 ?F (37.1 ?C)  ?TempSrc:  Oral Oral  Oral  ?SpO2: 95% 95% 96% 98%  ?Weight:      ?Height:      ? ? ?Intake/Output Summary (Last 24 hours) at 12/31/2021 1811 ?Last data filed at 12/31/2021 1731 ?Gross per 24 hour  ?Intake 237 ml  ?Output 2700 ml  ?Net -2463 ml  ? ?Filed Weights  ? 12/29/21 0123  ?Weight: 102.1 kg  ? ? ?Exam: No significant changes from prior exam. ? ?General: 71 y.o. year-old male well developed well nourished in no acute distress.  Alert and oriented x3. ?Cardiovascular: Regular rate and rhythm with no rubs or gallops.  No thyromegaly or JVD noted.   ?Respiratory: Clear to auscultation with no wheezes or rales. Good inspiratory effort. ?Abdomen: Soft nontender nondistended with normal bowel sounds x4 quadrants. ?Musculoskeletal: Trace lower extremity edema bilaterally. ?Skin: No ulcerative lesions noted or rashes ?Psychiatry: Mood is appropriate for condition and setting ? ? ?Data Reviewed: ?CBC: ?Recent Labs  ?Lab 12/29/21 ?0201 12/29/21 ?0220  ?WBC 5.8  --   ?NEUTROABS 3.8  --   ?HGB 11.1* 11.9*  ?HCT 35.8* 35.0*  ?MCV 89.3  --   ?PLT 212  --   ? ?Basic Metabolic Panel: ?Recent Labs  ?Lab 12/29/21 ?0201 12/29/21 ?0220  ?NA 136 139  ?K 4.3 4.2  ?CL 103 105  ?CO2 20*  --   ?GLUCOSE 109* 105*  ?BUN 18 21  ?CREATININE 0.98 1.00  ?CALCIUM 9.1  --   ? ?GFR: ?Estimated Creatinine Clearance:  79.9 mL/min (by C-G formula based on SCr of 1 mg/dL). ?Liver Function Tests: ?Recent Labs  ?Lab 12/29/21 ?0201  ?AST 28  ?ALT 14  ?ALKPHOS 80  ?BILITOT 0.9  ?PROT 6.8  ?ALBUMIN 3.5  ? ?No results for input(s): LIPASE, AMYLASE in the last 168 hours. ?No results for input(s): AMMONIA in the last 168 hours. ?Coagulation Profile: ?Recent Labs  ?Lab 12/29/21 ?0201  ?INR 1.3*  ? ?Cardiac Enzymes: ?No results for input(s): CKTOTAL, CKMB, CKMBINDEX, TROPONINI in the last 168 hours. ?BNP (last 3 results) ?No results for input(s): PROBNP in the last 8760 hours. ?HbA1C: ?No results for input(s): HGBA1C in the last 72 hours. ?CBG: ?Recent Labs  ?Lab 12/29/21 ?0134   ?GLUCAP 122*  ? ?Lipid Profile: ?No results for input(s): CHOL, HDL, LDLCALC, TRIG, CHOLHDL, LDLDIRECT in the last 72 hours. ?Thyroid Function Tests: ?No results for input(s): TSH, T4TOTAL, FREET4, T3FREE, THYROIDAB in the last 72 hours. ?Anemia Panel: ?No results for input(s): VITAMINB12, FOLATE, FERRITIN, TIBC, IRON, RETICCTPCT in the last 72 hours. ?Urine analysis: ?   ?Component Value Date/Time  ? COLORURINE STRAW (A) 12/29/2021 0900  ? APPEARANCEUR CLEAR 12/29/2021 0900  ? LABSPEC 1.012 12/29/2021 0900  ? PHURINE 5.0 12/29/2021 0900  ? GLUCOSEU >=500 (A) 12/29/2021 0900  ? HGBUR NEGATIVE 12/29/2021 0900  ? BILIRUBINUR NEGATIVE 12/29/2021 0900  ? KETONESUR NEGATIVE 12/29/2021 0900  ? PROTEINUR NEGATIVE 12/29/2021 0900  ? NITRITE NEGATIVE 12/29/2021 0900  ? LEUKOCYTESUR NEGATIVE 12/29/2021 0900  ? ?Sepsis Labs: ?'@LABRCNTIP'$ (procalcitonin:4,lacticidven:4) ? ?)No results found for this or any previous visit (from the past 240 hour(s)).  ? ? ?Studies: ?No results found. ? ?Scheduled Meds: ? allopurinol  300 mg Oral Daily  ? carvedilol  3.125 mg Oral BID WC  ? dapagliflozin propanediol  10 mg Oral QAC breakfast  ? docusate sodium  100 mg Oral QHS  ? doxycycline  50 mg Oral BID  ? LORazepam  2 mg Intravenous Once  ? losartan  25 mg Oral Daily  ? nystatin  5 mL Oral QID  ? rivaroxaban  20 mg Oral QHS  ? sertraline  150 mg Oral Daily  ? simvastatin  40 mg Oral QHS  ? spironolactone  12.5 mg Oral Daily  ? ? ?Continuous Infusions: ? sodium chloride 1,000 mL (12/31/21 1806)  ? levETIRAcetam 500 mg (12/31/21 0902)  ? ? ? LOS: 1 day  ? ? ? ?Kayleen Memos, MD ?[facility] ?Pager [phone] ? ?If 7PM-7AM, please contact night-coverage ?[email or web address] ?Password [email or web address] ?12/31/2021, 6:11 PM  ?  ?

## 2021-12-31 NOTE — Plan of Care (Signed)
  Problem: Education: Goal: Knowledge of General Education information will improve Description Including pain rating scale, medication(s)/side effects and non-pharmacologic comfort measures Outcome: Progressing   Problem: Health Behavior/Discharge Planning: Goal: Ability to manage health-related needs will improve Outcome: Progressing   

## 2021-12-31 NOTE — Progress Notes (Signed)
No issues overnight.  He is awake, alert, oriented and appropriate. ? ?MRI still pending. ? ?If MRI is going to be unduly delayed, no need for continued hospitalization.  If it could be obtained prior to discharge I think that would be preferable, but would not continue hospitalization solely for this. ? ?He could have an MRI done as an outpatient at a later time. ? ?I would continue Keppra 500 mg twice daily, if MRI is obtained, neurology will follow up on these results, otherwise neurology has signed off at this time and he should follow-up with outpatient neurology. ? ?Roland Rack, MD ?[facility] ?[phone] ? ?If 7pm- 7am, please page neurology on call as listed in [place]. ? ?

## 2021-12-31 NOTE — Discharge Summary (Addendum)
?Discharge Summary ? ?Patrick Fritz. ZSW:[id number] DOB: 04-11-1951 ? ?PCP: Mayra Neer, MD ? ?Admit date: 12/29/2021 ?Discharge date: 12/31/2021 ? ?Time spent: 35 minutes. ? ?Recommendations for Outpatient Follow-up:  ?Follow-up with neurology outpatient. ?Obtain an MRI brain outpatient on Monday 01/02/22 ?Continue fall precautions and seizure precautions. ?Continue PT OT with assistance and fall precautions. ?Do not drive for at least 6 months or operate heavy machinery until cleared by neurology. ? ?Discharge Diagnoses:  ?Active Hospital Problems  ? Diagnosis Date Noted  ? Seizure ([place]) 12/29/2021  ?  Priority: High  ? History of DVT in adulthood   ?  Priority: Medium   ? 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 Hypertension   ?  ?Resolved Hospital Problems  ?No resolved problems to display.  ? ? ?Discharge Condition: Stable ? ?Diet recommendation: Resume previous diet ? ?Vitals:  ? 12/31/21 0332 12/31/21 1532  ?BP: 128/64 140/81  ?Pulse: 66 66  ?Resp: 18 20  ?Temp: 98.7 ?F (37.1 ?C) 98.7 ?F (37.1 ?C)  ?SpO2: 96% 98%  ? ? ?History of present illness:  ? Patrick Fritz. is a 71 y.o. 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  Old frontal stroke on MRI in April 2022 (remote on MRI at that time).  Pt presents to ED with seizure like activity at home.  Had a recurrent seizure while in the ED.  He received IV Ativan and IV Keppra load.  Currently on IV Keppra twice daily.  EEG was negative.  MRI brain is pending. ?  ?12/31/2021: Patient was seen at his bedside.  No new complaints.  MRI brain is pending and will be obtained outpatient on Monday, 01/02/2022. ? ?Hospital Course:  ?Principal Problem: ?  Seizure ([place]) ?Active Problems: ?  History of DVT in adulthood ?  Hypertension ?  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 ?New onset seizure ([place]) ?2 seizures, 1 witnessed in ED, tonic-clonic. ?Remote stroke ?Seen by neurology. ?Continue seizure precautions and fall  precautions. ?Continue AED as recommended by neurology, Keppra 500 mg twice daily ?Outpatient neurology follow-up. ?MRI brain will be done outpatient as stated above. ?  ?History of DVT/bilateral pulmonary embolism. ?Resume home regimen. ?  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  ?Hypertension ?Resume home regimen ?Follow-up with your primary care provider ?  ?  ?Code Status: Full Code ?  ?Consults: Neuro ?  ? ?Discharge Exam: ?BP 140/81 (BP Location: Left Arm)   Pulse 66   Temp 98.7 ?F (37.1 ?C) (Oral)   Resp 20   Ht '5\' 10"'$  (1.778 m)   Wt 102.1 kg   SpO2 98%   BMI 32.28 kg/m?  ?General: 71 y.o. year-old male well developed well nourished in no acute distress.  Alert and oriented x3. ?Cardiovascular: Regular rate and rhythm with no rubs or gallops.  No thyromegaly or JVD noted.   ?Respiratory: Clear to auscultation with no wheezes or rales. Good inspiratory effort. ?Abdomen: Soft nontender nondistended with normal bowel sounds. ?Musculoskeletal: Trace lower extremity edema. ?Skin: No ulcerative lesions noted or rashes, ?Psychiatry: Mood is appropriate for condition and setting ? ?Discharge Instructions ?You were cared for by a hospitalist during your hospital stay. If you have any questions about your discharge medications or the care you received while you were in the hospital after you are discharged, you can call the unit and asked to speak with the hospitalist on call if the hospitalist that  took care of you is not available. Once you are discharged, your primary care physician will handle any further medical issues. Please note that NO REFILLS for any discharge medications will be authorized once you are discharged, as it is imperative that you return to your primary care physician (or establish a relationship with a primary care physician if you do not have one) for your aftercare needs so that they can reassess your need for medications and monitor your lab  values. ? ? ?Allergies as of 12/31/2021   ?No Known Allergies ?  ? ?  ?Medication List  ?  ? ?STOP taking these medications   ? ?methocarbamol 500 MG tablet ?Commonly known as: ROBAXIN ?  ? ?  ? ?TAKE these medications   ? ?acetaminophen 650 MG CR tablet ?Commonly known as: TYLENOL ?Take 650 mg by mouth every 8 (eight) hours as needed for pain. ?What changed: Another medication with the same name was removed. Continue taking this medication, and follow the directions you see here. ?  ?allopurinol 300 MG tablet ?Commonly known as: ZYLOPRIM ?Take 300 mg by mouth daily. ?  ?amoxicillin 500 MG tablet ?Commonly known as: AMOXIL ?Take 2,000 mg by mouth See admin instructions. 1 hour prior to dental appointment ?  ?carvedilol 3.125 MG tablet ?Commonly known as: COREG ?Take 1 tablet (3.125 mg total) by mouth 2 (two) times daily. ?  ?Colcrys 0.6 MG tablet ?Generic drug: colchicine ?Take 0.3 mg by mouth every other day. ?  ?dapagliflozin propanediol 10 MG Tabs tablet ?Commonly known as: [place] ?Take 1 tablet (10 mg total) by mouth daily before breakfast. ?  ?docusate sodium 100 MG capsule ?Commonly known as: COLACE ?Take 1 capsule (100 mg total) by mouth 2 (two) times daily. ?What changed: when to take this ?  ?doxycycline 50 MG capsule ?Commonly known as: VIBRAMYCIN ?Take 1 capsule (50 mg total) by mouth 2 (two) times daily. ?What changed: additional instructions ?  ?levETIRAcetam 500 MG tablet ?Commonly known as: Keppra ?Take 1 tablet (500 mg total) by mouth 2 (two) times daily. ?  ?losartan 25 MG tablet ?Commonly known as: COZAAR ?Take 25 mg by mouth daily. ?  ?ondansetron 4 MG tablet ?Commonly known as: ZOFRAN ?Take 1 tablet (4 mg total) by mouth every 6 (six) hours as needed for nausea. ?  ?oxyCODONE 5 MG immediate release tablet ?Commonly known as: Oxy IR/ROXICODONE ?Take 1 tablet (5 mg total) by mouth every 4 (four) hours as needed for severe pain. ?  ?polyethylene glycol 17 g packet ?Commonly known as: MIRALAX /  GLYCOLAX ?Take 17 g by mouth daily as needed for mild constipation. ?  ?rivaroxaban 20 MG Tabs tablet ?Commonly known as: Xarelto ?Take 1 tablet (20 mg total) by mouth daily with supper. ?What changed: when to take this ?  ?sertraline 100 MG tablet ?Commonly known as: ZOLOFT ?Take 150 mg by mouth daily. ?  ?simvastatin 40 MG tablet ?Commonly known as: ZOCOR ?Take 1 tablet (40 mg total) by mouth at bedtime. ?  ?spironolactone 25 MG tablet ?Commonly known as: ALDACTONE ?Take 0.5 tablets (12.5 mg total) by mouth daily. ?  ? ?  ? ?No Known Allergies ? Follow-up Information   ? ? Care, [facility] Follow up.   ?Specialty: Home Health Services ?Why: The home health agency will contact you for the first home visit. ?Contact information: ?[address] ?[address] ?[place] [address] ?[phone] ? ? ?  ?  ? ? Mayra Neer, MD. Call today.   ?  Specialty: Family Medicine ?Why: Please call for a posthospital follow-up appointment. ?Contact information: ?[address] ?[place] [address] ?[phone] ? ? ?  ?  ? ? Belva Crome, MD .   ?Specialty: Cardiology ?Contact information: ?[address] ?[address] ?[place] [address] ?[phone] ? ? ?  ?  ? ? [facility]. Call today.   ?Why: Please call for a posthospital follow-up appointment.  Please obtain MRI brain outpatient. ?Contact information: ?[address]     [place] [address] ?[phone] ? ?  ?  ? ?  ?  ? ?  ? ? ? ?The results of significant diagnostics from this hospitalization (including imaging, microbiology, ancillary and laboratory) are listed below for reference.   ? ?Significant Diagnostic Studies: ?CT HEAD WO CONTRAST ? ?Result Date: 12/29/2021 ?CLINICAL DATA:  Seizure, new onset. EXAM: CT HEAD WITHOUT CONTRAST TECHNIQUE: Contiguous axial images were obtained from the base of the skull through the vertex without intravenous contrast. RADIATION DOSE REDUCTION: This exam  was performed according to the departmental dose-optimization program which includes automated exposure control, adjustment of the mA and/or kV according to patient size and/or use of iterative reconstruction technique. COMPARISON:  04/09/

## 2021-12-31 NOTE — Progress Notes (Signed)
Occupational Therapy Treatment ?Patient Details ?Name: Patrick Fritz. ?MRN: [id number] ?DOB: 1951-01-26 ?Today's Date: 12/31/2021 ? ? ?History of present illness Pt is a 71 y/o male admitted secondary to seizures. PMH includes bilateral TKA (R redone in 12/22), DVT, HTN, CVA (R frontal April 22)and glaucoma. ?  ?OT comments ? Pt continues to progress toward established OT goals. Pt completed trail making test part A and B demonstrating limitations with visual attention and motor speed. Pt requires minguard for functional mobility at RW level with completion of ADL while standing at sink level. Pt will continue to benefit from skilled OT services to maximize safety and independence with ADL/IADL and functional mobility. Will continue to follow acutely and progress as tolerated.   ? ?Recommendations for follow up therapy are one component of a multi-disciplinary discharge planning process, led by the attending physician.  Recommendations may be updated based on patient status, additional functional criteria and insurance authorization. ?   ?Follow Up Recommendations ? Home health OT  ?  ?Assistance Recommended at Discharge Frequent or constant Supervision/Assistance  ?Patient can return home with the following ? A little help with walking and/or transfers;A little help with bathing/dressing/bathroom;Assistance with cooking/housework;Direct supervision/assist for medications management;Direct supervision/assist for financial management;Help with stairs or ramp for entrance;Assist for transportation ?  ?Equipment Recommendations ? BSC/3in1  ?  ?Recommendations for Other Services   ? ?  ?Precautions / Restrictions Precautions ?Precautions: Fall ?Restrictions ?Weight Bearing Restrictions: No  ? ? ?  ? ?Mobility Bed Mobility ?Overal bed mobility: Needs Assistance ?Bed Mobility: Supine to Sit, Sit to Supine ?  ?  ?Supine to sit: Min guard ?  ?  ?General bed mobility comments: increased effort, use of bed rails and increased  time ?  ? ?Transfers ?Overall transfer level: Needs assistance ?Equipment used: Rolling walker (2 wheels) ?Transfers: Sit to/from Stand ?Sit to Stand: Min guard ?  ?  ?  ?  ?  ?General transfer comment: minguard for safety and cues for safe hand placement ?  ?  ?Balance Overall balance assessment: Needs assistance ?Sitting-balance support: No upper extremity supported, Feet supported ?Sitting balance-Leahy Scale: Fair ?  ?  ?Standing balance support: Single extremity supported, During functional activity ?Standing balance-Leahy Scale: Poor ?Standing balance comment: reliant on at least single UE support in standing ?  ?  ?  ?  ?  ?  ?  ?  ?  ?  ?  ?   ? ?ADL either performed or assessed with clinical judgement  ? ?ADL Overall ADL's : Needs assistance/impaired ?  ?  ?Grooming: Min guard;Standing ?Grooming Details (indicate cue type and reason): at sink level ?  ?  ?  ?  ?Upper Body Dressing : Set up;Sitting ?  ?Lower Body Dressing: Sit to/from stand;Min guard ?Lower Body Dressing Details (indicate cue type and reason): for safety and stability with adjusting pants while standing ?Toilet Transfer: Min guard;Rolling walker (2 wheels) ?Toilet Transfer Details (indicate cue type and reason): for safety and stability ?  ?  ?  ?  ?Functional mobility during ADLs: Min guard;Rolling walker (2 wheels) ?  ?  ? ?Extremity/Trunk Assessment Upper Extremity Assessment ?Upper Extremity Assessment: Overall WFL for tasks assessed ?  ?Lower Extremity Assessment ?Lower Extremity Assessment: Defer to PT evaluation ?  ?  ?  ? ?Vision   ?Additional Comments: tracking WNL, visual attention is poor ~8mn 30seconds before really losing focus ?  ?Perception   ?  ?Praxis   ?  ? ?Cognition  Arousal/Alertness: Awake/alert ?Behavior During Therapy: [facility] for tasks assessed/performed ?Overall Cognitive Status: No family/caregiver present to determine baseline cognitive functioning ?Area of Impairment: Following commands, Problem solving, Awareness,  Safety/judgement, Memory ?  ?  ?  ?  ?  ?  ?  ?  ?  ?  ?Memory: Decreased short-term memory ?Following Commands: Follows one step commands consistently, Follows one step commands with increased time, Follows multi-step commands inconsistently ?Safety/Judgement: Decreased awareness of safety, Decreased awareness of deficits ?Awareness: Emergent ?Problem Solving: Slow processing, Requires verbal cues ?General Comments: pt with decreased awareness of deficits, slow processing and required cues for use of RW. Pt completed trail making test part A in 56seconds. He attempted trail making test part B (two step) and demonstrated attention span WNL for about 13mn 30seconds. Pt made it to number 7 and then began connecting numbers not letter and stated "I forgot where I was" (in regards to number/letter. ?  ?  ?   ?Exercises   ? ?  ?Shoulder Instructions   ? ? ?  ?General Comments wife present and very helpful throughout session  ? ? ?Pertinent Vitals/ Pain       Pain Assessment ?Pain Assessment: No/denies pain ? ?Home Living   ?  ?  ?  ?  ?  ?  ?  ?  ?  ?  ?  ?  ?  ?  ?  ?  ?  ?  ? ?  ?Prior Functioning/Environment    ?  ?  ?  ?   ? ?Frequency ? Min 2X/week  ? ? ? ? ?  ?Progress Toward Goals ? ?OT Goals(current goals can now be found in the care plan section) ? Progress towards OT goals: Progressing toward goals ? ?Acute Rehab OT Goals ?Patient Stated Goal: to go home ?OT Goal Formulation: With patient ?Time For Goal Achievement: 01/13/22 ?Potential to Achieve Goals: Good ?ADL Goals ?Pt Will Perform Grooming: Independently;standing ?Pt Will Perform Lower Body Dressing: with modified independence;sit to/from stand ?Pt Will Transfer to Toilet: with modified independence;bedside commode;ambulating ?Pt Will Perform Toileting - Clothing Manipulation and hygiene: with modified independence;sit to/from stand ?Pt Will Perform Tub/Shower Transfer: Tub transfer;with modified independence;ambulating;3 in 1;shower seat;rolling  walker ?Additional ADL Goal #1: Pt will complete 3 step trail making task with no more than supervision.  ?Plan Discharge plan remains appropriate   ? ?Co-evaluation ? ? ?   ?  ?  ?  ?  ? ?  ?AM-PAC OT "6 Clicks" Daily Activity     ?Outcome Measure ? ? Help from another person eating meals?: None ?Help from another person taking care of personal grooming?: A Little ?Help from another person toileting, which includes using toliet, bedpan, or urinal?: A Little ?Help from another person bathing (including washing, rinsing, drying)?: A Little ?Help from another person to put on and taking off regular upper body clothing?: A Little ?Help from another person to put on and taking off regular lower body clothing?: A Little ?6 Click Score: 19 ? ?  ?End of Session Equipment Utilized During Treatment: Gait belt;Rolling walker (2 wheels) ? ?OT Visit Diagnosis: Other abnormalities of gait and mobility (R26.89);Muscle weakness (generalized) (M62.81);Other symptoms and signs involving cognitive function;Other symptoms and signs involving the nervous system (R29.898) ?  ?Activity Tolerance Patient tolerated treatment well ?  ?Patient Left in bed;with call bell/phone within reach;with bed alarm set;with family/visitor present ?  ?Nurse Communication Mobility status ?  ? ?   ? ?Time: [address]?OT  Time Calculation (min): 29 min ? ?Charges: OT General Charges ?$OT Visit: 1 Visit ?OT Treatments ?$Self Care/Home Management : 23-37 mins ? ?Helene Kelp OTR/L ?Acute Rehabilitation Services ?Office: [phone] ? ? ?Wyn Forster ?12/31/2021, 3:47 PM ? ? ?

## 2022-01-02 NOTE — Progress Notes (Signed)
?[facility] ?[address] ?[place]. [place] [address] ?[phone] ? ?     OFFICE FOLLOW UP NOTE ? ?[name]. ?Date of Birth:  1951-10-06 ?Medical Record Number:  [id number]  ? ?Reason for visit: Seizure follow up ? ? ? ?SUBJECTIVE: ? ? ?CHIEF COMPLAINT:  ?Chief Complaint  ?Patient presents with  ? Follow-up  ?  RM 3 with spouse [name] ?Pt is well and stable, states he has been fatigued and weak since d/c  ? ? ?HPI:  ? ? ?Update 01/02/2022 JM: Patient returns due to recent seizure activity and hospital follow-up.  He is accompanied by his wife.  He has not been seen in over 10 months although requested 6 mo follow up after he was seen for TIA versus seizure activity.  He presented to ED on 3/16 with seizure-like activity and recurrent tonic-clonic seizure in ED receiving IV Ativan and IV Keppra load. Evaluated by neurologist Dr. Leonel Ramsay.  He was started on Keppra 500 mg twice daily. EEG mild diffuse encephalopathy but no seizures or epileptiform discharges.  Planned on completing MR brain outpatient and discharged home on 3/18. ? ?Of note, back in 06/2021, wife called office reporting episode of his hands starting to jerk, head fell back and unable to speak for 1-2 minutes. He did not follow up as recommended.  ? ?He has been stable since discharge, no additional seizure activity. Does report some generalized fatigue since discharge but did have some fatigue prior due to chronic low back pain. Compliant on Keppra 500 mg twice daily, denies side effects. He has not yet completed MRI - wife was under the impression this would be done today at appointment. Wife asks multiple appropriate questions regarding recent events and prior imaging.  ? ? ? ? ? ?History provided for reference purposes only ?Initial visit 02/22/2021 JM: [name] is being seen for hospital follow-up accompanied by his wife, [name] ? ?Doing well from stroke standpoint without new or reoccurring symptoms.  He does report  occasional short-term memory difficulty or delayed recall but this has been on going ?C/o intermittent dizziness with one episode of passing out -cardiology aware who felt likely due to slight orthostasis with recent medication adjustments ?Also reports right leg calf and thigh pain as well as swelling that has been improving. Per wife, cardiology evaluated yesterday and no emergent concerns reported - defer ongoing monitoring to cardiology ? ?Compliant on Xarelto and simvastatin without associated side effects ?Blood pressure today 114/67 - occasionally monitors at home  ? ?No further concerns at this time ? ?Hospitalization 01/22/2021 ?Patrick Fritz is a 71 y.o. male with history of of DJD of lumbar spine, frequent lower back pain, history of bilateral PE, history of DVT, glaucoma, hyperlipidemia, hypertension, impaired fasting glucose, OSA no longer on BiPAP, CAD/CABG (per patient required defibrillation during CABG) who presented  on 01/22/2021 with AMS and recent episode with dysarthria. Patient symptoms resolved by the time he presented to the ED and his AMS improved on his initial exam.  Personally reviewed hospitalization pertinent progress notes, lab work and imaging with summary provided.  CT head and MRI negative for acute stroke and symptoms likely in setting of TIA vs possible unwitnessed seizure with postictal confusion as symptoms lasted for approximately 2-2.5hrs and pt unable to recall episode.  EEG negative.  Recommended continuation of Xarelto for secondary stroke prevention with history of DVT and PE.  2D echo mild worsening of HFrEF from 40 to 45%  in 2017 to now 30 to 35% -recommended outpatient follow-up with established cardiologist.  LDL 55.6 on simvastatin.  Evaluated by therapy and discharged home in stable condition without therapy needs. ? ?Posterior circulation TIA vs unwitnessed seizure with post ictal presentation ?Code Stroke : CT head No acute abnormality. Small vessel disease.  ASPECTS 10.  ?CTA head & neck: No high grade stenosis of the intracranial arteries and no Hemodynamically significant stenosis of bilateral carotids ?CT perfusion: negative ?MRI : No acute findings, chronic small vessel dx and small remote frontal lobe infarct ?2D Echo: LVEF 30-35%, LV regional wall abnormalities (septal and inferior wall hypokinesis), Mild AVR, L atrium mildy enlarged, Mild MVR, ?EEG no evidence of seizure activity ?LDL 55.6 ?HgbA1c 5.6 ?VTE prophylaxis - SCDs ?aspirin 81 mg daily and Xarelto (rivaroxaban) daily prior to admission will restart ,Xarelto (rivaroxaban) daily.  Additional aspirin not necessary from a stroke standpoint ?Therapy recommendations: none ?Disposition:  Home ? ? ? ? ?ROS:   ?14 system review of systems performed and negative with exception of those listed in HPI ? ?PMH:  ?Past Medical History:  ?Diagnosis Date  ? Cancer [facility])   ? skin  ? DJD (degenerative joint disease), lumbar   ? DVT (deep venous thrombosis) ([place])   ? Dysrhythmia   ? Family history of adverse reaction to anesthesia   ? Glaucoma   ? R eye diminished vision approx 50%  ? High cholesterol   ? Hyperlipidemia   ? Hypertension   ? IFG (impaired fasting glucose)   ? OSA (obstructive sleep apnea)   ? Severe w AHI 34/hr on HST no on BiPAP at 19/15cm H2O  ? PE (pulmonary embolism)   ? Second occurance,enknown etiology,lifelong anticoagulation  ? ? ?PSH:  ?Past Surgical History:  ?Procedure Laterality Date  ? CARDIAC CATHETERIZATION N/A 05/14/2016  ? Procedure: Left Heart Cath and Coronary Angiography;  Surgeon: Belva Crome, MD;  Location: [facility];  Service: Cardiovascular;  Laterality: N/A;  ? CORONARY ARTERY BYPASS GRAFT N/A 05/14/2016  ? Procedure: CORONARY ARTERY BYPASS GRAFTING (CABG) times two with LIMA to LAD and right leg SVG to PDA,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;  Surgeon: Ivin Poot, MD;  Location: [facility];  Service: Open Heart Surgery;  Laterality: N/A;  ? EXCISIONAL TOTAL KNEE  ARTHROPLASTY WITH ANTIBIOTIC SPACERS Right 05/26/2021  ? Procedure: EXCISIONAL TOTAL KNEE ARTHROPLASTY WITH ANTIBIOTIC SPACERS;  Surgeon: Rod Can, MD;  Location: [facility];  Service: Orthopedics;  Laterality: Right;  ? KNEE SURGERY Bilateral   ? arthroscopic  ? TOTAL KNEE ARTHROPLASTY Bilateral 1996  ? TOTAL KNEE REVISION Right 09/22/2021  ? Procedure: RIGHT KNEE REMOVAL SPACER TOTAL KNEE REVISION;  Surgeon: Rod Can, MD;  Location: [facility];  Service: Orthopedics;  Laterality: Right;  210  ? ? ?Social History:  ?Social History  ? ?Socioeconomic History  ? Marital status: Married  ?  Spouse name: Not on file  ? Number of children: Not on file  ? Years of education: Not on file  ? Highest education level: Not on file  ?Occupational History  ? Not on file  ?Tobacco Use  ? Smoking status: Never  ? Smokeless tobacco: Never  ?Vaping Use  ? Vaping Use: Never used  ?Substance and Sexual Activity  ? Alcohol use: Yes  ?  Comment: 1-2 per week  ? Drug use: Never  ? Sexual activity: Not on file  ?Other Topics Concern  ? Not on file  ?Social History Narrative  ?  Not on file  ? ?Social Determinants of Health  ? ?Financial Resource Strain: Not on file  ?Food Insecurity: Not on file  ?Transportation Needs: Not on file  ?Physical Activity: Not on file  ?Stress: Not on file  ?Social Connections: Not on file  ?Intimate Partner Violence: Not on file  ? ? ?Family History:  ?Family History  ?Problem Relation Age of Onset  ? Osteoporosis Mother   ? Hypertension Father   ? CAD Father   ? ? ?Medications:   ?Current Outpatient Medications on File Prior to Visit  ?Medication Sig Dispense Refill  ? acetaminophen (TYLENOL) 650 MG CR tablet Take 650 mg by mouth every 8 (eight) hours as needed for pain.    ? allopurinol (ZYLOPRIM) 300 MG tablet Take 300 mg by mouth daily.     ? amoxicillin (AMOXIL) 500 MG tablet Take 2,000 mg by mouth See admin instructions. 1 hour prior to dental appointment    ? carvedilol (COREG) 3.125 MG tablet Take  1 tablet (3.125 mg total) by mouth 2 (two) times daily. 180 tablet 3  ? colchicine 0.6 MG tablet Take 0.3 mg by mouth every other day.    ? dapagliflozin propanediol (FARXIGA) 10 MG TABS tablet Take 1 t

## 2022-01-03 ENCOUNTER — Ambulatory Visit (INDEPENDENT_AMBULATORY_CARE_PROVIDER_SITE_OTHER): Payer: Medicare Other | Admitting: Adult Health

## 2022-01-03 ENCOUNTER — Encounter: Payer: Self-pay | Admitting: Adult Health

## 2022-01-03 VITALS — BP 101/61 | HR 68 | Ht 70.0 in | Wt 225.0 lb

## 2022-01-03 DIAGNOSIS — R569 Unspecified convulsions: Secondary | ICD-10-CM | POA: Diagnosis not present

## 2022-01-03 DIAGNOSIS — Z8673 Personal history of transient ischemic attack (TIA), and cerebral infarction without residual deficits: Secondary | ICD-10-CM

## 2022-01-03 MED ORDER — LEVETIRACETAM 500 MG PO TABS: 500.0000 mg | ORAL_TABLET | Freq: Two times a day (BID) | ORAL | 3 refills | Status: DC

## 2022-01-03 NOTE — Patient Instructions (Addendum)
Your Plan: ? ?Continue Keppra 500 mg twice daily for seizure prevention ? ?Please call with any seizure activity or call 911 immediately for any seizure lasting longer than 5 minutes, prolonged confusion/fatigue or recovery, or if more than 1 seizure occurs ? ?Avoid seizure provoking triggers such as sleep deprivation, stimulants, drastic change in dietary habits, extreme stressors and medication noncompliance ? ?Please contact [facility] to schedule your MRI at [phone] ? ? ? ? ?Follow-up in 4 months or call earlier if needed ? ? ? ? ?Thank you for coming to see [place] at [facility]. I hope we have been able to provide you high quality care today. ? ?You may receive a patient satisfaction survey over the next few weeks. We would appreciate your feedback and comments so that we may continue to improve ourselves and the health of our patients. ? ? ? ? ?Seizure, Adult ?A seizure is a sudden burst of abnormal electrical and chemical activity in the brain. Seizures usually last from 30 seconds to 2 minutes.  ?What are the causes? ?Common causes of this condition include: ?Fever or infection. ?Problems that affect the brain. These may include: ?A brain or head injury. ?Bleeding in the brain. ?A brain tumor. ?Low levels of blood sugar or salt. ?Kidney problems or liver problems. ?Conditions that are passed from parent to child (are inherited). ?Problems with a substance, such as: ?Having a reaction to a drug or a medicine. ?Stopping the use of a substance all of a sudden (withdrawal). ?A stroke. ?Disorders that affect how you develop. ?Sometimes, the cause may not be known.  ?What increases the risk? ?Having someone in your family who has epilepsy. In this condition, seizures happen again and again over time. They have no clear cause. ?Having had a tonic-clonic seizure before. This type of seizure causes you to: ?Tighten the muscles of the whole body. ?Lose consciousness. ?Having had a head  injury or strokes before. ?Having had a lack of oxygen at birth. ?What are the signs or symptoms? ?There are many types of seizures. The symptoms vary depending on the type of seizure you have. ?Symptoms during a seizure ?Shaking that you cannot control (convulsions) with fast, jerky movements of muscles. ?Stiffness of the body. ?Breathing problems. ?Feeling mixed up (confused). ?Staring or not responding to sound or touch. ?Head nodding. ?Eyes that blink, flutter, or move fast. ?Drooling, grunting, or making clicking sounds with your mouth ?Losing control of when you pee or poop. ?Symptoms before a seizure ?Feeling afraid, nervous, or worried. ?Feeling like you may vomit. ?Feeling like: ?You are moving when you are not. ?Things around you are moving when they are not. ?Feeling like you saw or heard something before (d?j? vu). ?Odd tastes or smells. ?Changes in how you see. You may see flashing lights or spots. ?Symptoms after a seizure ?Feeling confused. ?Feeling sleepy. ?Headache. ?Sore muscles. ?How is this treated? ?If your seizure stops on its own, you will not need treatment. If your seizure lasts longer than 5 minutes, you will normally need treatment. Treatment may include: ?Medicines given through an IV tube. ?Avoiding things, such as medicines, that are known to cause your seizures. ?Medicines to prevent seizures. ?A device to prevent or control seizures. ?Surgery. ?A diet low in carbohydrates and high in fat (ketogenic diet). ?Follow these instructions at home: ?Medicines ?Take over-the-counter and prescription medicines only as told by your doctor. ?Avoid foods or drinks that may keep your medicine from working, such as alcohol. ?Activity ?  Follow instructions about driving, swimming, or doing things that would be dangerous if you had another seizure. Wait until your doctor says it is safe for you to do these things. ?If you live in the [place] ask your local department of motor vehicles when you can  drive. ?Get a lot of rest. ?Teaching others ? ?Teach friends and family what to do when you have a seizure. They should: ?Help you get down to the ground. ?Protect your head and body. ?Loosen any clothing around your neck. ?Turn you on your side. ?Know whether or not you need emergency care. ?Stay with you until you are better. ?Also, tell them what not to do if you have a seizure. Tell them: ?They should not hold you down. ?They should not put anything in your mouth. ?General instructions ?Avoid anything that gives you seizures. ?Keep a seizure diary. Write down: ?What you remember about each seizure. ?What you think caused each seizure. ?Keep all follow-up visits. ?Contact a doctor if: ?You have another seizure or seizures. Call the doctor each time you have a seizure. ?The pattern of your seizures changes. ?You keep having seizures with treatment. ?You have symptoms of being sick or having an infection. ?You are not able to take your medicine. ?Get help right away if: ?You have any of these problems: ?A seizure that lasts longer than 5 minutes. ?Many seizures in a row and you do not feel better between seizures. ?A seizure that makes it harder to breathe. ?A seizure and you can no longer speak or use part of your body. ?You do not wake up right after a seizure. ?You get hurt during a seizure. ?You feel confused or have pain right after a seizure. ?These symptoms may be an emergency. Get help right away. Call your local emergency services (911 in the [place] ?Do not wait to see if the symptoms will go away. ?Do not drive yourself to the hospital. ?Summary ?A seizure is a sudden burst of abnormal electrical and chemical activity in the brain. Seizures normally last from 30 seconds to 2 minutes. ?Causes of seizures include illness, injury to the head, low levels of blood sugar or salt, and certain conditions. ?Most seizures will stop on their own in less than 5 minutes. Seizures that last longer than 5 minutes are a  medical emergency and need treatment right away. ?Many medicines are used to treat seizures. Take over-the-counter and prescription medicines only as told by your doctor. ?This information is not intended to replace advice given to you by your health care provider. Make sure you discuss any questions you have with your health care provider. ?Document Revised: 04/09/2020 Document Reviewed: 04/09/2020 ?Elsevier Patient Education ? [employer]. ? ?

## 2022-01-04 ENCOUNTER — Telehealth: Payer: Self-pay | Admitting: Adult Health

## 2022-01-04 MED ORDER — DIAZEPAM 2 MG PO TABS: 2.0000 mg | ORAL_TABLET | Freq: Once | ORAL | 0 refills | Status: AC

## 2022-01-04 NOTE — Telephone Encounter (Signed)
Pt's wife states pt has been scheduled for his MRI on 03-26.  The hospital provider can not call in anything to calm pt for the MRI because he is no longer in the hospital.  Wife reached out to pt's PCP who states she feels his neurologist should be the one to call in something for pt's upcoming MRI.  Wife is asking if Patrick Fritz will please call in something to Patrick Fritz  for pt's upcoming MRI  ?

## 2022-01-04 NOTE — Addendum Note (Signed)
Addended by: Frann Rider L on: 01/04/2022 10:50 AM ? ? Modules accepted: Orders ? ?

## 2022-01-04 NOTE — Telephone Encounter (Signed)
Attempted to reach the pt and discuss Np's response, but pt was not available and vm was full. If pt calls back phone staff can relay Np's message. I will also try and call back at a later time. ?

## 2022-01-04 NOTE — Telephone Encounter (Addendum)
I called pt's wife and was able to relay messag from NP. She verbalized understanding and agreement to plan. She will be the driver for the pt at this upcoming scan.  ?

## 2022-01-04 NOTE — Telephone Encounter (Signed)
Due to anxiety related to MRI, will place order for Valium '2mg'$  tab 30 min prior to MRI and can repeat x1 after 30 minutes if needed. Please ensure patient has a driver for this imaging to be completed. Thank you.  ?

## 2022-01-05 NOTE — Progress Notes (Signed)
I agree with the above plan 

## 2022-01-08 ENCOUNTER — Other Ambulatory Visit: Payer: Self-pay

## 2022-01-08 ENCOUNTER — Ambulatory Visit (HOSPITAL_COMMUNITY)
Admission: RE | Admit: 2022-01-08 | Discharge: 2022-01-08 | Disposition: A | Discharge status: Home / Self Care | Payer: Medicare Other | Source: Ambulatory Visit | Attending: Internal Medicine | Admitting: Internal Medicine

## 2022-01-08 DIAGNOSIS — R569 Unspecified convulsions: Secondary | ICD-10-CM | POA: Insufficient documentation

## 2022-01-08 DIAGNOSIS — G319 Degenerative disease of nervous system, unspecified: Secondary | ICD-10-CM | POA: Diagnosis not present

## 2022-01-24 NOTE — Progress Notes (Signed)
?Cardiology Office Note:   ? ?Date:  01/25/2022  ? ?ID:  Patrick Nash., DOB 1951/02/12, MRN [id number] ? ?PCP:  Mayra Neer, MD  ?Cardiologist:  Sinclair Grooms, MD  ? ?Referring MD: Mayra Neer, MD  ? ?Chief Complaint  ?Patient presents with  ? Congestive Heart Failure  ? Hypertension  ? Hyperlipidemia  ? ? ?History of Present Illness:   ? ?Patrick Veronica. is a 71 y.o. 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 ? ? ?He has had quite a bit of difficulty over the last several months.  Within the past month he had a seizure without significant abnormality being found although he does seem to have a small scar focus in his brain.  He had a TIA in April 2022.  His back gives him a lot of trouble and he is not able to walk and work as he would like to.  He admits that he has gotten somewhat depressed. ? ?Complains of shortness of breath today.  Feels tired all the time.  Sleeps a lot late.  Cannot equate this with the shortness of breath he had when EF was low.  There is no orthopnea.  He has diagnosed sleep apnea but does not wear CPAP.  Denies angina.  No peripheral edema. ? ?Past Medical History:  ?Diagnosis Date  ? Cancer [facility])   ? skin  ? DJD (degenerative joint disease), lumbar   ? DVT (deep venous thrombosis) ([place])   ? Dysrhythmia   ? Family history of adverse reaction to anesthesia   ? Glaucoma   ? R eye diminished vision approx 50%  ? High cholesterol   ? Hyperlipidemia   ? Hypertension   ? IFG (impaired fasting glucose)   ? OSA (obstructive sleep apnea)   ? Severe w AHI 34/hr on HST no on BiPAP at 19/15cm H2O  ? PE (pulmonary embolism)   ? Second occurance,enknown etiology,lifelong anticoagulation  ? ? ?Past Surgical  History:  ?Procedure Laterality Date  ? CARDIAC CATHETERIZATION N/A 05/14/2016  ? Procedure: Left Heart Cath and Coronary Angiography;  Surgeon: Belva Crome, MD;  Location: [place] [facility];  Service: Cardiovascular;  Laterality: N/A;  ? CORONARY ARTERY BYPASS GRAFT N/A 05/14/2016  ? Procedure: CORONARY ARTERY BYPASS GRAFTING (CABG) times two with LIMA to LAD and right leg SVG to PDA,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;  Surgeon: Ivin Poot, MD;  Location: [place];  Service: Open Heart Surgery;  Laterality: N/A;  ? EXCISIONAL TOTAL KNEE ARTHROPLASTY WITH ANTIBIOTIC SPACERS Right 05/26/2021  ? Procedure: EXCISIONAL TOTAL KNEE ARTHROPLASTY WITH ANTIBIOTIC SPACERS;  Surgeon: Rod Can, MD;  Location: [facility];  Service: Orthopedics;  Laterality: Right;  ? KNEE SURGERY Bilateral   ? arthroscopic  ? TOTAL KNEE ARTHROPLASTY Bilateral 1996  ? TOTAL KNEE REVISION Right 09/22/2021  ? Procedure: RIGHT KNEE REMOVAL SPACER TOTAL KNEE REVISION;  Surgeon: Rod Can, MD;  Location: [facility];  Service: Orthopedics;  Laterality: Right;  210  ? ? ?Current Medications: ?Current Meds  ?Medication Sig  ? acetaminophen (TYLENOL) 650 MG CR tablet Take 650 mg by mouth every 8 (eight) hours as needed for pain.  ? allopurinol (ZYLOPRIM) 300 MG tablet Take 300 mg  by mouth daily.   ? amoxicillin (AMOXIL) 500 MG tablet Take 2,000 mg by mouth See admin instructions. 1 hour prior to dental appointment  ? carvedilol (COREG) 3.125 MG tablet Take 1 tablet (3.125 mg total) by mouth 2 (two) times daily.  ? colchicine 0.6 MG tablet Take 0.3 mg by mouth every other day.  ? dapagliflozin propanediol (FARXIGA) 10 MG TABS tablet Take 1 tablet (10 mg total) by mouth daily before breakfast.  ? docusate sodium (COLACE) 100 MG capsule Take 1 capsule (100 mg total) by mouth 2 (two) times daily. (Patient taking differently: Take 100 mg by mouth at bedtime.)  ? doxycycline (VIBRAMYCIN) 50 MG capsule Take 1 capsule (50 mg total) by mouth 2  (two) times daily. (Patient taking differently: Take 50 mg by mouth 2 (two) times daily. Continuously)  ? levETIRAcetam (KEPPRA) 500 MG tablet Take 1 tablet (500 mg total) by mouth 2 (two) times daily.  ? losartan (COZAAR) 25 MG tablet Take 25 mg by mouth daily.  ? oxyCODONE (OXY IR/ROXICODONE) 5 MG immediate release tablet Take 1 tablet (5 mg total) by mouth every 4 (four) hours as needed for severe pain.  ? polyethylene glycol (MIRALAX / GLYCOLAX) 17 g packet Take 17 g by mouth daily as needed for mild constipation.  ? rivaroxaban (XARELTO) 20 MG TABS tablet Take 1 tablet (20 mg total) by mouth daily with supper. (Patient taking differently: Take 20 mg by mouth at bedtime.)  ? sertraline (ZOLOFT) 100 MG tablet Take 150 mg by mouth daily.  ? simvastatin (ZOCOR) 40 MG tablet Take 1 tablet (40 mg total) by mouth at bedtime.  ? spironolactone (ALDACTONE) 25 MG tablet Take 0.5 tablets (12.5 mg total) by mouth daily.  ?  ? ?Allergies:   Patient has no known allergies.  ? ?Social History  ? ?Socioeconomic History  ? Marital status: Married  ?  Spouse name: Not on file  ? Number of children: Not on file  ? Years of education: Not on file  ? Highest education level: Not on file  ?Occupational History  ? Not on file  ?Tobacco Use  ? Smoking status: Never  ? Smokeless tobacco: Never  ?Vaping Use  ? Vaping Use: Never used  ?Substance and Sexual Activity  ? Alcohol use: Yes  ?  Comment: 1-2 per week  ? Drug use: Never  ? Sexual activity: Not on file  ?Other Topics Concern  ? Not on file  ?Social History Narrative  ? Not on file  ? ?Social Determinants of Health  ? ?Financial Resource Strain: Not on file  ?Food Insecurity: Not on file  ?Transportation Needs: Not on file  ?Physical Activity: Not on file  ?Stress: Not on file  ?Social Connections: Not on file  ?  ? ?Family History: ?The patient's family history includes CAD in his father; Hypertension in his father; Osteoporosis in his mother. ? ?ROS:   ?Please see the history of  present illness.    ?Back hurts continuously and it prevents him from doing activities that he enjoys.  He cannot drive because of the recent seizure.  He stopped wearing CPAP because he could not tolerate it.  All other systems reviewed and are negative. ? ?EKGs/Labs/Other Studies Reviewed:   ? ?The following studies were reviewed today: ?2D Doppler echocardiogram December 2022: ?IMPRESSIONS  ? ? ? 1. Left ventricular ejection fraction, by estimation, is 50 to 55%. The  ?left ventricle has low normal function. The left ventricle demonstrates  ?regional  wall motion abnormalities. The basal-to-mid inferior wall is  ?severely hypokinetic. There is mild  ?concentric left ventricular hypertrophy. Left ventricular diastolic  ?parameters are consistent with Grade II diastolic dysfunction  ?(pseudonormalization).  ? 2. Right ventricular systolic function is normal. The right ventricular  ?size is normal.  ? 3. The mitral valve is normal in structure. Trivial mitral valve  ?regurgitation.  ? 4. The aortic valve is tricuspid. There is mild calcification of the  ?aortic valve. There is mild thickening of the aortic valve. Aortic valve  ?regurgitation is mild. Aortic valve sclerosis/calcification is present,  ?without any evidence of aortic  ?stenosis.  ? 5. Left atrial size was mildly dilated.  ? ?Comparison(s): Compared to prior TTE on 01/22/2021, the LVEF has improved  ?from 30-35% to 50-55% and wall motion has improved.  ? ?EKG:  EKG not repeated ? ?Recent Labs: ?05/25/2021: Magnesium 2.1 ?12/29/2021: ALT 14; BUN 21; Creatinine, Ser 1.00; Hemoglobin 11.9; Platelets 212; Potassium 4.2; Sodium 139  ?Recent Lipid Panel ?   ?Component Value Date/Time  ? CHOL 184 01/22/2021 0430  ? CHOL 165 10/21/2020 0932  ? TRIG 472 (H) 01/22/2021 0430  ? HDL 34 (L) 01/22/2021 0430  ? HDL 49 10/21/2020 0932  ? CHOLHDL 5.4 01/22/2021 0430  ? VLDL UNABLE TO CALCULATE IF TRIGLYCERIDE OVER 400 mg/dL 01/22/2021 0430  ? LDLCALC UNABLE TO CALCULATE IF  TRIGLYCERIDE OVER 400 mg/dL 01/22/2021 0430  ? [place] 97 10/21/2020 0932  ? LDLDIRECT 55.6 01/22/2021 0430  ? ? ?Physical Exam:   ? ?VS:  BP 108/62   Pulse 67   Ht '5\' 10"'$  (1.778 m)   Wt 226 lb (102.5 kg)   S

## 2022-01-25 ENCOUNTER — Ambulatory Visit: Payer: Medicare Other | Admitting: Interventional Cardiology

## 2022-01-25 ENCOUNTER — Encounter: Payer: Self-pay | Admitting: Interventional Cardiology

## 2022-01-25 VITALS — BP 108/62 | HR 67 | Ht 70.0 in | Wt 226.0 lb

## 2022-01-25 DIAGNOSIS — E785 Hyperlipidemia, unspecified: Secondary | ICD-10-CM

## 2022-01-25 DIAGNOSIS — Z95828 Presence of other vascular implants and grafts: Secondary | ICD-10-CM | POA: Diagnosis not present

## 2022-01-25 DIAGNOSIS — Z951 Presence of aortocoronary bypass graft: Secondary | ICD-10-CM | POA: Diagnosis not present

## 2022-01-25 DIAGNOSIS — I5022 Chronic systolic (congestive) heart failure: Secondary | ICD-10-CM

## 2022-01-25 DIAGNOSIS — F32A Depression, unspecified: Secondary | ICD-10-CM

## 2022-01-25 DIAGNOSIS — I2699 Other pulmonary embolism without acute cor pulmonale: Secondary | ICD-10-CM

## 2022-01-25 DIAGNOSIS — Z7901 Long term (current) use of anticoagulants: Secondary | ICD-10-CM | POA: Diagnosis not present

## 2022-01-25 DIAGNOSIS — G4733 Obstructive sleep apnea (adult) (pediatric): Secondary | ICD-10-CM

## 2022-01-25 NOTE — Patient Instructions (Signed)
Medication Instructions:  ?Your physician recommends that you continue on your current medications as directed. Please refer to the Current Medication list given to you today. ? ?*If you need a refill on your cardiac medications before your next appointment, please call your pharmacy* ? ? ?Lab Work: ?None ?If you have labs (blood work) drawn today and your tests are completely normal, you will receive your results only by: ?MyChart Message (if you have MyChart) OR ?A paper copy in the mail ?If you have any lab test that is abnormal or we need to change your treatment, we will call you to review the results. ? ? ?Testing/Procedures: ?Your physician has recommended that you have a sleep study. This test records several body functions during sleep, including: brain activity, eye movement, oxygen and carbon dioxide blood levels, heart rate and rhythm, breathing rate and rhythm, the flow of air through your mouth and nose, snoring, body muscle movements, and chest and belly movement. ? ? ?Follow-Up: ?At [facility], you and your health needs are our priority.  As part of our continuing mission to provide you with exceptional heart care, we have created designated Provider Care Teams.  These Care Teams include your primary Cardiologist (physician) and Advanced Practice Providers (APPs -  Physician Assistants and Nurse Practitioners) who all work together to provide you with the care you need, when you need it. ? ?We recommend signing up for the patient portal called "MyChart".  Sign up information is provided on this After Visit Summary.  MyChart is used to connect with patients for Virtual Visits (Telemedicine).  Patients are able to view lab/test results, encounter notes, upcoming appointments, etc.  Non-urgent messages can be sent to your provider as well.   ?To learn more about what you can do with MyChart, go to [email or web address].   ? ?Your next appointment:   ?6 month(s) ? ?The format for your next  appointment:   ?In Person ? ?Provider:   ?Sinclair Grooms, MD  ? ? ?Other Instructions ? ? ?Important Information About Sugar ? ? ? ? ?  ?

## 2022-01-27 ENCOUNTER — Telehealth: Payer: Self-pay | Admitting: *Deleted

## 2022-01-27 DIAGNOSIS — T8459XA Infection and inflammatory reaction due to other internal joint prosthesis, initial encounter: Secondary | ICD-10-CM | POA: Diagnosis not present

## 2022-01-27 DIAGNOSIS — Z96659 Presence of unspecified artificial knee joint: Secondary | ICD-10-CM | POA: Diagnosis not present

## 2022-01-27 DIAGNOSIS — G4733 Obstructive sleep apnea (adult) (pediatric): Secondary | ICD-10-CM

## 2022-01-27 DIAGNOSIS — M009 Pyogenic arthritis, unspecified: Secondary | ICD-10-CM | POA: Diagnosis not present

## 2022-01-27 NOTE — Telephone Encounter (Addendum)
Prior Authorization for SPLIT NIGHT sent to [facility] via web portal.  Prior Authorization is not required for the requested services ? ?

## 2022-01-30 NOTE — Addendum Note (Signed)
Addended by: Freada Bergeron on: 01/30/2022 09:42 AM ? ? Modules accepted: Orders ? ?

## 2022-01-30 NOTE — Telephone Encounter (Addendum)
Prior Authorization for SPLIT NIGHT sent to [facility] via web portal.  ?VALID  - 01/3022 - 03/14/22 ? ?NO PA REQUIRED ?DECISION #[id number] ?

## 2022-02-26 DIAGNOSIS — Z96659 Presence of unspecified artificial knee joint: Secondary | ICD-10-CM | POA: Diagnosis not present

## 2022-02-26 DIAGNOSIS — M009 Pyogenic arthritis, unspecified: Secondary | ICD-10-CM | POA: Diagnosis not present

## 2022-02-26 DIAGNOSIS — T8459XA Infection and inflammatory reaction due to other internal joint prosthesis, initial encounter: Secondary | ICD-10-CM | POA: Diagnosis not present

## 2022-03-06 ENCOUNTER — Telehealth: Payer: Self-pay

## 2022-03-06 NOTE — Telephone Encounter (Signed)
Patient's wife left a voicemail this morning asking for a call to discuss patient's resumption of tramadol.  Patient's wife reports that patient has been taking tramadol for chronic back pain for years.  However, Dr. Brigitte Pulse would like Jessica's approval before refilling the tramadol for this patient. Please call back at [phone].

## 2022-03-07 ENCOUNTER — Ambulatory Visit (HOSPITAL_BASED_OUTPATIENT_CLINIC_OR_DEPARTMENT_OTHER): Payer: Medicare Other | Attending: Interventional Cardiology | Admitting: Cardiology

## 2022-03-07 DIAGNOSIS — G4733 Obstructive sleep apnea (adult) (pediatric): Secondary | ICD-10-CM

## 2022-03-07 DIAGNOSIS — R0683 Snoring: Secondary | ICD-10-CM | POA: Diagnosis not present

## 2022-03-10 NOTE — Procedures (Unsigned)
   Patient Name: Patrick Fritz, Patrick Fritz Date: 03/07/2022 Gender: Male D.O.B: 1951/03/18 Age (years): 71 Referring Provider: Verdis Prime Height (inches): 70 Interpreting Physician: Armanda Magic MD, ABSM Weight (lbs): 209 RPSGT: Armen Pickup BMI: 30 MRN: [id number] Neck Size: 16.50  CLINICAL INFORMATION Sleep Study Type: NPSG  Indication for sleep study: OSA, Snoring  Epworth Sleepiness Score: 6  SLEEP STUDY TECHNIQUE As per the AASM Manual for the Scoring of Sleep and Associated Events v2.3 (April 2016) with a hypopnea requiring 4% desaturations.  The channels recorded and monitored were frontal, central and occipital EEG, electrooculogram (EOG), submentalis EMG (chin), nasal and oral airflow, thoracic and abdominal wall motion, anterior tibialis EMG, snore microphone, electrocardiogram, and pulse oximetry.  MEDICATIONS Medications self-administered by patient taken the night of the study : N/A  SLEEP ARCHITECTURE The study was initiated at 9:48:38 PM and ended at 4:14:58 AM.  Sleep onset time was 23.8 minutes and the sleep efficiency was 75.5%. The total sleep time was 291.5 minutes.  Stage REM latency was 157.0 minutes.  The patient spent 5.0% of the night in stage N1 sleep, 73.6% in stage N2 sleep, 0.0% in stage N3 and 21.4% in REM.  Alpha intrusion was absent.  Supine sleep was 11.49%.  RESPIRATORY PARAMETERS The overall apnea/hypopnea index (AHI) was 13.0 per hour. There were 26 total apneas, including 25 obstructive, 0 central and 1 mixed apneas. There were 37 hypopneas and 23 RERAs.  The AHI during Stage REM sleep was 29.8 per hour.  AHI while supine was 48.4 per hour.  The mean oxygen saturation was 93.5%. The minimum SpO2 during sleep was 86.0%.  moderate snoring was noted during this study.  CARDIAC DATA The 2 lead EKG demonstrated sinus rhythm. The mean heart rate was 70.0 beats per minute. Other EKG findings include: PVCs  LEG MOVEMENT DATA The total  PLMS were 0 with a resulting PLMS index of 0.0. Associated arousal with leg movement index was 0.2 .  IMPRESSIONS - Mild obstructive sleep apnea occurred during this study (AHI = 13.0/h). - Mild oxygen desaturation was noted during this study (Min O2 = 86.0%). - The patient snored with moderate snoring volume. - No cardiac abnormalities were noted during this study. - Clinically significant periodic limb movements did not occur during sleep. No significant associated arousals.  DIAGNOSIS - Obstructive Sleep Apnea (G47.33)  RECOMMENDATIONS - Therapeutic CPAP titration to determine optimal pressure required to alleviate sleep disordered breathing. - Positional therapy avoiding supine position during sleep. - Avoid alcohol, sedatives and other CNS depressants that may worsen sleep apnea and disrupt normal sleep architecture. - Sleep hygiene should be reviewed to assess factors that may improve sleep quality. - Weight management and regular exercise should be initiated or continued if appropriate.  [Electronically signed] 03/12/2022 05:49 PM  Armanda Magic MD, ABSM Diplomate, American Board of Sleep Medicine

## 2022-03-21 ENCOUNTER — Telehealth: Payer: Self-pay | Admitting: *Deleted

## 2022-03-21 DIAGNOSIS — G4733 Obstructive sleep apnea (adult) (pediatric): Secondary | ICD-10-CM

## 2022-03-21 NOTE — Telephone Encounter (Signed)
-----   Message from Lauralee Evener, [place] sent at 03/14/2022  8:22 AM EDT -----  ----- Message ----- From: Sueanne Margarita, MD Sent: 03/12/2022   5:52 PM EDT To: Cv Div Sleep Studies  Please let patient know that they have sleep apnea.  Recommend therapeutic CPAP titration for treatment of patient's sleep disordered breathing.  If unable to perform an in lab titration then initiate ResMed auto CPAP from 4 to 15cm H2O with heated humidity and mask of choice and overnight pulse ox on CPAP.

## 2022-03-21 NOTE — Telephone Encounter (Addendum)
The patient has been notified of the result via his mychart. No message left on his voicemail is was full.

## 2022-04-03 ENCOUNTER — Telehealth: Payer: Self-pay | Admitting: Adult Health

## 2022-04-03 NOTE — Telephone Encounter (Signed)
Wife states Pt has less than a week worth of medication  levETIRAcetam (KEPPRA) 500 MG tablet, wife is asking for enough medication be calling in to [place] until appointment

## 2022-04-04 NOTE — Telephone Encounter (Signed)
Called wife on DPR and informed her he has levetiracetam refills at [facility]. She verbalized understanding, appreciation.

## 2022-04-06 NOTE — Telephone Encounter (Signed)
The patient has been notified of the result and verbalized understanding.  All questions (if any) were answered. Marolyn Hammock, CMA 04/06/2022 7:03 PM    Will precert titration

## 2022-04-06 NOTE — Addendum Note (Signed)
Addended by: Freada Bergeron on: 04/06/2022 05:24 PM   Modules accepted: Orders

## 2022-05-09 ENCOUNTER — Encounter: Payer: Self-pay | Admitting: Adult Health

## 2022-05-09 ENCOUNTER — Ambulatory Visit (INDEPENDENT_AMBULATORY_CARE_PROVIDER_SITE_OTHER): Payer: Medicare Other | Admitting: Adult Health

## 2022-05-09 VITALS — BP 112/58 | HR 72 | Ht 70.0 in | Wt 228.5 lb

## 2022-05-09 DIAGNOSIS — I69398 Other sequelae of cerebral infarction: Secondary | ICD-10-CM

## 2022-05-09 DIAGNOSIS — M79641 Pain in right hand: Secondary | ICD-10-CM

## 2022-05-09 DIAGNOSIS — R1310 Dysphagia, unspecified: Secondary | ICD-10-CM | POA: Diagnosis not present

## 2022-05-09 DIAGNOSIS — R569 Unspecified convulsions: Secondary | ICD-10-CM

## 2022-05-09 DIAGNOSIS — Z8673 Personal history of transient ischemic attack (TIA), and cerebral infarction without residual deficits: Secondary | ICD-10-CM

## 2022-05-09 DIAGNOSIS — M79642 Pain in left hand: Secondary | ICD-10-CM

## 2022-05-09 NOTE — Progress Notes (Addendum)
[facility] [address] [place]. [place] [address] [phone]       OFFICE FOLLOW UP NOTE  Mr. Ajay Strubel. Date of Birth:  Feb 20, 1951 Medical Record Number:  [id number]   Reason for visit: Seizure and stroke follow up    SUBJECTIVE:   CHIEF COMPLAINT:  Chief Complaint  Patient presents with   Follow-up    Pt is well. No questions or concerns. Room 2 with wife    HPI:   Update 05/09/2022 JM: Patient returns for 44-monthseizure and stroke follow-up accompanied by his wife.   Has been stable since prior visit without any additional seizure activity.  Remains on Keppra 500 mg twice daily, denies side effects.  Completed MRI which did not show any new or acute findings but did note redemonstrated small chronic cortical/subcortical infarct within the mid right frontal lobe which could reflect a seizure focus.  He does note over the past couple of months coughing after swallowing food and liquids. At times, can cough so bad he will vomit. Can occur with any type of food and drink.  Does not have difficulty initiating swallowing.  Denies any issues with swallowing in the past or hx of esophageal issues.  Denies any issues with acid reflux or history of acid reflux.  Denies any new or reoccurring stroke/TIA symptoms Compliant on Xarelto and simvastatin, denies side effects Blood pressure today 112/58  Reports continued left hand pain and at times right hand. [facility] who recently started him on colchicine for possible acute gout flare with some improvement of pain, has f/u in a couple weeks. He also notes chronic back pain and is scheduled to see Dr. EEllene Routethis week.  Completed sleep study 02/2022 with Dr. TRadford Paxwhich showed mild OSA with total AHI 13/h and recommended therapeutic CPAP titration.    No further concerns at this time    History provided for reference purposes  only Update 01/02/2022 JM: Patient returns due to recent seizure activity and hospital follow-up.  He is accompanied by his wife.  He has not been seen in over 10 months although requested 6 mo follow up after he was seen for TIA versus seizure activity.  He presented to ED on 3/16 with seizure-like activity and recurrent tonic-clonic seizure in ED receiving IV Ativan and IV Keppra load. Evaluated by neurologist Dr. KLeonel Ramsay  He was started on Keppra 500 mg twice daily. EEG mild diffuse encephalopathy but no seizures or epileptiform discharges.  Planned on completing MR brain outpatient and discharged home on 3/18.  Of note, back in 06/2021, wife called office reporting episode of his hands starting to jerk, head fell back and unable to speak for 1-2 minutes. He did not follow up as recommended.   He has been stable since discharge, no additional seizure activity. Does report some generalized fatigue since discharge but did have some fatigue prior due to chronic low back pain. Compliant on Keppra 500 mg twice daily, denies side effects. He has not yet completed MRI - wife was under the impression this would be done today at appointment. Wife asks multiple appropriate questions regarding recent events and prior imaging.   Initial visit 02/22/2021 JM: Mr. SAleshireis being seen for hospital follow-up accompanied by his wife, JOpal Sidles Doing well from stroke standpoint without new or reoccurring symptoms.  He does report occasional short-term memory difficulty or delayed recall but this has been on  going C/o intermittent dizziness with one episode of passing out -cardiology aware who felt likely due to slight orthostasis with recent medication adjustments Also reports right leg calf and thigh pain as well as swelling that has been improving. Per wife, cardiology evaluated yesterday and no emergent concerns reported - defer ongoing monitoring to cardiology  Compliant on Xarelto and simvastatin without associated  side effects Blood pressure today 114/67 - occasionally monitors at home   No further concerns at this time  Hospitalization 01/22/2021 Mr. SIRE POET is a 71 y.o. male with history of of DJD of lumbar spine, frequent lower back pain, history of bilateral PE, history of DVT, glaucoma, hyperlipidemia, hypertension, impaired fasting glucose, OSA no longer on BiPAP, CAD/CABG (per patient required defibrillation during CABG) who presented  on 01/22/2021 with AMS and recent episode with dysarthria. Patient symptoms resolved by the time he presented to the ED and his AMS improved on his initial exam.  Personally reviewed hospitalization pertinent progress notes, lab work and imaging with summary provided.  CT head and MRI negative for acute stroke and symptoms likely in setting of TIA vs possible unwitnessed seizure with postictal confusion as symptoms lasted for approximately 2-2.5hrs and pt unable to recall episode.  EEG negative.  Recommended continuation of Xarelto for secondary stroke prevention with history of DVT and PE.  2D echo mild worsening of HFrEF from 40 to 45% in 2017 to now 30 to 35% -recommended outpatient follow-up with established cardiologist.  LDL 55.6 on simvastatin.  Evaluated by therapy and discharged home in stable condition without therapy needs.  Posterior circulation TIA vs unwitnessed seizure with post ictal presentation Code Stroke : CT head No acute abnormality. Small vessel disease. ASPECTS 10.  CTA head & neck: No high grade stenosis of the intracranial arteries and no Hemodynamically significant stenosis of bilateral carotids CT perfusion: negative MRI : No acute findings, chronic small vessel dx and small remote frontal lobe infarct 2D Echo: LVEF 30-35%, LV regional wall abnormalities (septal and inferior wall hypokinesis), Mild AVR, L atrium mildy enlarged, Mild MVR, EEG no evidence of seizure activity LDL 55.6 HgbA1c 5.6 VTE prophylaxis - SCDs aspirin 81 mg daily  and Xarelto (rivaroxaban) daily prior to admission will restart ,Xarelto (rivaroxaban) daily.  Additional aspirin not necessary from a stroke standpoint Therapy recommendations: none Disposition:  Home     ROS:   14 system review of systems performed and negative with exception of those listed in HPI  PMH:  Past Medical History:  Diagnosis Date   Cancer ([place])    skin   DJD (degenerative joint disease), lumbar    DVT (deep venous thrombosis) ([facility])    Dysrhythmia    Family history of adverse reaction to anesthesia    Glaucoma    R eye diminished vision approx 50%   High cholesterol    Hyperlipidemia    Hypertension    IFG (impaired fasting glucose)    OSA (obstructive sleep apnea)    Severe w AHI 34/hr on HST no on BiPAP at 19/15cm H2O   PE (pulmonary embolism)    Second occurance,enknown etiology,lifelong anticoagulation    PSH:  Past Surgical History:  Procedure Laterality Date   CARDIAC CATHETERIZATION N/A 05/14/2016   Procedure: Left Heart Cath and Coronary Angiography;  Surgeon: Belva Crome, MD;  Location: [facility];  Service: Cardiovascular;  Laterality: N/A;   CORONARY ARTERY BYPASS GRAFT N/A 05/14/2016   Procedure: CORONARY ARTERY BYPASS GRAFTING (CABG) times two  with LIMA to LAD and right leg SVG to PDA,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;  Surgeon: Ivin Poot, MD;  Location: [facility];  Service: Open Heart Surgery;  Laterality: N/A;   EXCISIONAL TOTAL KNEE ARTHROPLASTY WITH ANTIBIOTIC SPACERS Right 05/26/2021   Procedure: EXCISIONAL TOTAL KNEE ARTHROPLASTY WITH ANTIBIOTIC SPACERS;  Surgeon: Rod Can, MD;  Location: [facility];  Service: Orthopedics;  Laterality: Right;   KNEE SURGERY Bilateral    arthroscopic   TOTAL KNEE ARTHROPLASTY Bilateral 1996   TOTAL KNEE REVISION Right 09/22/2021   Procedure: RIGHT KNEE REMOVAL SPACER TOTAL KNEE REVISION;  Surgeon: Rod Can, MD;  Location: [facility];  Service: Orthopedics;  Laterality: Right;  210     Social History:  Social History   Socioeconomic History   Marital status: Married    Spouse name: Not on file   Number of children: Not on file   Years of education: Not on file   Highest education level: Not on file  Occupational History   Not on file  Tobacco Use   Smoking status: Never   Smokeless tobacco: Never  Vaping Use   Vaping Use: Never used  Substance and Sexual Activity   Alcohol use: Yes    Comment: 1-2 per week   Drug use: Never   Sexual activity: Not on file  Other Topics Concern   Not on file  Social History Narrative   Not on file   Social Determinants of Health   Financial Resource Strain: Not on file  Food Insecurity: Not on file  Transportation Needs: Not on file  Physical Activity: Not on file  Stress: Not on file  Social Connections: Not on file  Intimate Partner Violence: Not on file    Family History:  Family History  Problem Relation Age of Onset   Osteoporosis Mother    Hypertension Father    CAD Father     Medications:   Current Outpatient Medications on File Prior to Visit  Medication Sig Dispense Refill   acetaminophen (TYLENOL) 650 MG CR tablet Take 650 mg by mouth every 8 (eight) hours as needed for pain.     allopurinol (ZYLOPRIM) 300 MG tablet Take 300 mg by mouth daily.      amoxicillin (AMOXIL) 500 MG tablet Take 2,000 mg by mouth See admin instructions. 1 hour prior to dental appointment     carvedilol (COREG) 3.125 MG tablet Take 1 tablet (3.125 mg total) by mouth 2 (two) times daily. 180 tablet 3   colchicine 0.6 MG tablet Take 0.3 mg by mouth every other day.     dapagliflozin propanediol (FARXIGA) 10 MG TABS tablet Take 1 tablet (10 mg total) by mouth daily before breakfast. 90 tablet 3   docusate sodium (COLACE) 100 MG capsule Take 1 capsule (100 mg total) by mouth 2 (two) times daily. (Patient taking differently: Take 100 mg by mouth at bedtime.) 60 capsule 2   levETIRAcetam (KEPPRA) 500 MG tablet Take 1 tablet  (500 mg total) by mouth 2 (two) times daily. 180 tablet 3   losartan (COZAAR) 25 MG tablet Take 25 mg by mouth daily.     rivaroxaban (XARELTO) 20 MG TABS tablet Take 1 tablet (20 mg total) by mouth daily with supper. (Patient taking differently: Take 20 mg by mouth at bedtime.) 30 tablet 2   sertraline (ZOLOFT) 100 MG tablet Take 150 mg by mouth daily.     simvastatin (ZOCOR) 40 MG tablet Take 1 tablet (40 mg total) by  mouth at bedtime. 90 tablet 3   spironolactone (ALDACTONE) 25 MG tablet Take 0.5 tablets (12.5 mg total) by mouth daily. 45 tablet 3   doxycycline (VIBRAMYCIN) 50 MG capsule Take 1 capsule (50 mg total) by mouth 2 (two) times daily. (Patient not taking: Reported on 05/09/2022) 60 capsule 6   oxyCODONE (OXY IR/ROXICODONE) 5 MG immediate release tablet Take 1 tablet (5 mg total) by mouth every 4 (four) hours as needed for severe pain. (Patient not taking: Reported on 05/09/2022) 40 tablet 0   polyethylene glycol (MIRALAX / GLYCOLAX) 17 g packet Take 17 g by mouth daily as needed for mild constipation. (Patient not taking: Reported on 05/09/2022) 14 each 0   No current facility-administered medications on file prior to visit.    Allergies:   No Known Allergies     OBJECTIVE:  Physical Exam  Vitals:   05/09/22 1117  BP: (!) 112/58  Pulse: 72  Weight: 228 lb 8 oz (103.6 kg)  Height: '5\' 10"'$  (1.778 m)    Body mass index is 32.79 kg/m. No results found.   General: well developed, well nourished, pleasant elderly Caucasian male, seated, in no evident distress Head: head normocephalic and atraumatic.   Neck: supple with no carotid or supraclavicular bruits Cardiovascular: regular rate and rhythm, no murmurs Musculoskeletal: no deformity Skin:  no rash/petichiae Vascular:  Normal pulses all extremities   Neurologic Exam Mental Status: Awake and fully alert.  Fluent speech and language.  Oriented to place and time. Recent memory subjectively mildly impaired and remote  memory intact. Attention span, concentration and fund of knowledge appropriate. Mood and affect appropriate.  Cranial Nerves: Pupils equal, briskly reactive to light. Extraocular movements full without nystagmus. Visual fields full to confrontation. Hearing intact. Facial sensation intact. Very slight left facial asymmetry. tongue, palate moves normally and symmetrically.  Motor: Normal bulk and tone. Normal strength in all tested extremity muscles  Sensory.: intact to touch , pinprick , position and vibratory sensation.  Coordination: Rapid alternating movements normal in all extremities slightly decreased left hand. Finger-to-nose and heel-to-shin performed accurately bilaterally.  Gait and Station: Arises from chair without difficulty. Stance is slightly hunched. Gait demonstrates slow cautious steps with decreased stride length and step height without use of assistive device.  Tandem walk and heel toe not attempted due to safety concerns Reflexes: 1+ and symmetric. Toes downgoing.          ASSESSMENT: Adaiah Morken. is a 71 y.o. year old male with posterior circulation TIA vs unwitnessed seizure with postictal state on 01/22/2021 after presenting with altered mental status and dysarthria.  Witnessed generalized tonic-clonic seizure 12/29/2021 possibly symptomatic from old left frontal stroke as seen on [facility].  Vascular risk factors include HLD, hx of PE and DVT on Xarelto, multiple prior strokes on imaging, HTN, OSA not on BiPAP, CHF and CAD s/p CABG.      PLAN:  Seizure likely from prior stroke:  continue Keppra 500 mg twice daily  -refill up-to-date EEG 3/16 diffuse encephalopathy, no seizures or epileptiform discharges  MRI brain 12/2021 no acute findings, high cortical/subcortical infarct within the mid right frontal lobe which could reflect a seizure focus as well as chronic small vessel Shikhman changes stable since prior imaging, redemonstrated chronic infarcts in the right BG and  left cerebral hemisphere and mild generalized cerebral atrophy MR brain 01/2021 chronic right frontal infarct, R BG lacunes, left cerebellar infarct and remote microhemorrhages Discussed avoidance of seizure provoking triggers, ensuring medication compliance  and no driving for 6 months post seizure activity per [place] law (around 06/2022) Advised to call office with any recurrent seizure activity Prolonged discussion re: seizures, possible etiology and other pertinent education Hx of multiple strokes (per imaging, ?silent strokes) Continue current treatment regimen with Xarelto (hx of PE/DVT) and simvastatin.  Continue close PCP follow-up for aggressive stroke risk factor management including HTN with BP goal<130/90 and HLD with LDL goal<70 Stroke labs 03/2022: A1c 5.8, LDL 52 Dysphagia: Order placed for MBS for further evaluation. Will hold off on repeat MRI brain at this time as no other associated symptoms and neurological exam intact.  Hand pain: Suspect form of arthritis contributing to pain although noted some improvement of pain after initiating colchicine.  [facility]     Follow up in 6 months or call earlier if needed   CC:  PCP: Mayra Neer, MD     I spent 38 minutes of face-to-face and non-face-to-face time with patient and wife.  This included previsit chart review, lab review, study review, electronic health record documentation, patient and wife education and discussion regarding seizure disorder and current medication use, history of prior stroke, secondary stroke prevention and aggressive stroke risk factor management and answered all other questions to patient and wife's satisfaction  Frann Rider, AGNP-BC  [facility] [address] [place], [place] [address]  Phone [phone] Fax [phone] Note: This document was prepared with digital dictation and possible smart phrase technology. Any transcriptional  errors that result from this process are unintentional.

## 2022-05-09 NOTE — Addendum Note (Signed)
Addended by: Frann Rider L on: 05/09/2022 01:30 PM   Modules accepted: Orders

## 2022-05-09 NOTE — Patient Instructions (Addendum)
Your Plan:  Continue Keppra 500 mg twice daily for seizure prevention  Avoid seizure provoking triggers such as medication noncompliance, extreme stress, sleep deprivation, substance abuse, and extreme changes in diet/exercise    Follow-up in 6 months or call earlier if needed     Thank you for coming to see [place] at [facility]. I hope we have been able to provide you high quality care today.  You may receive a patient satisfaction survey over the next few weeks. We would appreciate your feedback and comments so that we may continue to improve ourselves and the health of our patients.    Seizure, Adult A seizure is a sudden burst of abnormal electrical and chemical activity in the brain. Seizures usually last from 30 seconds to 2 minutes.  What are the causes? Common causes of this condition include: Fever or infection. Problems that affect the brain. These may include: A brain or head injury. Bleeding in the brain. A brain tumor. Low levels of blood sugar or salt. Kidney problems or liver problems. Conditions that are passed from parent to child (are inherited). Problems with a substance, such as: Having a reaction to a drug or a medicine. Stopping the use of a substance all of a sudden (withdrawal). A stroke. Disorders that affect how you develop. Sometimes, the cause may not be known.  What increases the risk? Having someone in your family who has epilepsy. In this condition, seizures happen again and again over time. They have no clear cause. Having had a tonic-clonic seizure before. This type of seizure causes you to: Tighten the muscles of the whole body. Lose consciousness. Having had a head injury or strokes before. Having had a lack of oxygen at birth. What are the signs or symptoms? There are many types of seizures. The symptoms vary depending on the type of seizure you have. Symptoms during a seizure Shaking that you cannot control  (convulsions) with fast, jerky movements of muscles. Stiffness of the body. Breathing problems. Feeling mixed up (confused). Staring or not responding to sound or touch. Head nodding. Eyes that blink, flutter, or move fast. Drooling, grunting, or making clicking sounds with your mouth Losing control of when you pee or poop. Symptoms before a seizure Feeling afraid, nervous, or worried. Feeling like you may vomit. Feeling like: You are moving when you are not. Things around you are moving when they are not. Feeling like you saw or heard something before (dj vu). Odd tastes or smells. Changes in how you see. You may see flashing lights or spots. Symptoms after a seizure Feeling confused. Feeling sleepy. Headache. Sore muscles. How is this treated? If your seizure stops on its own, you will not need treatment. If your seizure lasts longer than 5 minutes, you will normally need treatment. Treatment may include: Medicines given through an IV tube. Avoiding things, such as medicines, that are known to cause your seizures. Medicines to prevent seizures. A device to prevent or control seizures. Surgery. A diet low in carbohydrates and high in fat (ketogenic diet). Follow these instructions at home: Medicines Take over-the-counter and prescription medicines only as told by your doctor. Avoid foods or drinks that may keep your medicine from working, such as alcohol. Activity Follow instructions about driving, swimming, or doing things that would be dangerous if you had another seizure. Wait until your doctor says it is safe for you to do these things. If you live in the [place] ask your local department of motor vehicles  when you can drive. Get a lot of rest. Teaching others  Teach friends and family what to do when you have a seizure. They should: Help you get down to the ground. Protect your head and body. Loosen any clothing around your neck. Turn you on your side. Know whether  or not you need emergency care. Stay with you until you are better. Also, tell them what not to do if you have a seizure. Tell them: They should not hold you down. They should not put anything in your mouth. General instructions Avoid anything that gives you seizures. Keep a seizure diary. Write down: What you remember about each seizure. What you think caused each seizure. Keep all follow-up visits. Contact a doctor if: You have another seizure or seizures. Call the doctor each time you have a seizure. The pattern of your seizures changes. You keep having seizures with treatment. You have symptoms of being sick or having an infection. You are not able to take your medicine. Get help right away if: You have any of these problems: A seizure that lasts longer than 5 minutes. Many seizures in a row and you do not feel better between seizures. A seizure that makes it harder to breathe. A seizure and you can no longer speak or use part of your body. You do not wake up right after a seizure. You get hurt during a seizure. You feel confused or have pain right after a seizure. These symptoms may be an emergency. Get help right away. Call your local emergency services (911 in the [place] Do not wait to see if the symptoms will go away. Do not drive yourself to the hospital. Summary A seizure is a sudden burst of abnormal electrical and chemical activity in the brain. Seizures normally last from 30 seconds to 2 minutes. Causes of seizures include illness, injury to the head, low levels of blood sugar or salt, and certain conditions. Most seizures will stop on their own in less than 5 minutes. Seizures that last longer than 5 minutes are a medical emergency and need treatment right away. Many medicines are used to treat seizures. Take over-the-counter and prescription medicines only as told by your doctor. This information is not intended to replace advice given to you by your health care  provider. Make sure you discuss any questions you have with your health care provider. Document Revised: 04/09/2020 Document Reviewed: 04/09/2020 Elsevier Patient Education  [place].

## 2022-05-22 ENCOUNTER — Other Ambulatory Visit: Payer: Self-pay | Admitting: Interventional Cardiology

## 2022-06-12 DIAGNOSIS — M5136 Other intervertebral disc degeneration, lumbar region: Secondary | ICD-10-CM | POA: Diagnosis not present

## 2022-06-12 DIAGNOSIS — M5416 Radiculopathy, lumbar region: Secondary | ICD-10-CM | POA: Diagnosis not present

## 2022-06-14 DIAGNOSIS — M5127 Other intervertebral disc displacement, lumbosacral region: Secondary | ICD-10-CM | POA: Diagnosis not present

## 2022-06-14 DIAGNOSIS — M4807 Spinal stenosis, lumbosacral region: Secondary | ICD-10-CM | POA: Diagnosis not present

## 2022-06-14 DIAGNOSIS — M5416 Radiculopathy, lumbar region: Secondary | ICD-10-CM | POA: Diagnosis not present

## 2022-06-30 DIAGNOSIS — G8929 Other chronic pain: Secondary | ICD-10-CM | POA: Diagnosis not present

## 2022-08-30 DIAGNOSIS — M064 Inflammatory polyarthropathy: Secondary | ICD-10-CM | POA: Diagnosis not present

## 2022-08-30 DIAGNOSIS — E7800 Pure hypercholesterolemia, unspecified: Secondary | ICD-10-CM | POA: Diagnosis not present

## 2022-08-30 DIAGNOSIS — Z23 Encounter for immunization: Secondary | ICD-10-CM | POA: Diagnosis not present

## 2022-08-30 DIAGNOSIS — I700 Atherosclerosis of aorta: Secondary | ICD-10-CM | POA: Diagnosis not present

## 2022-08-30 DIAGNOSIS — I69398 Other sequelae of cerebral infarction: Secondary | ICD-10-CM | POA: Diagnosis not present

## 2022-08-30 DIAGNOSIS — I25119 Atherosclerotic heart disease of native coronary artery with unspecified angina pectoris: Secondary | ICD-10-CM | POA: Diagnosis not present

## 2022-08-30 DIAGNOSIS — D6869 Other thrombophilia: Secondary | ICD-10-CM | POA: Diagnosis not present

## 2022-08-30 DIAGNOSIS — R7303 Prediabetes: Secondary | ICD-10-CM | POA: Diagnosis not present

## 2022-08-30 DIAGNOSIS — G4733 Obstructive sleep apnea (adult) (pediatric): Secondary | ICD-10-CM | POA: Diagnosis not present

## 2022-08-30 DIAGNOSIS — I509 Heart failure, unspecified: Secondary | ICD-10-CM | POA: Diagnosis not present

## 2022-08-30 DIAGNOSIS — Z0000 Encounter for general adult medical examination without abnormal findings: Secondary | ICD-10-CM | POA: Diagnosis not present

## 2022-08-30 DIAGNOSIS — I2782 Chronic pulmonary embolism: Secondary | ICD-10-CM | POA: Diagnosis not present

## 2022-09-11 DIAGNOSIS — M47816 Spondylosis without myelopathy or radiculopathy, lumbar region: Secondary | ICD-10-CM | POA: Diagnosis not present

## 2022-09-11 DIAGNOSIS — M5136 Other intervertebral disc degeneration, lumbar region: Secondary | ICD-10-CM | POA: Diagnosis not present

## 2022-09-21 DIAGNOSIS — M47816 Spondylosis without myelopathy or radiculopathy, lumbar region: Secondary | ICD-10-CM | POA: Diagnosis not present

## 2022-10-03 DIAGNOSIS — M47816 Spondylosis without myelopathy or radiculopathy, lumbar region: Secondary | ICD-10-CM | POA: Diagnosis not present

## 2022-10-13 IMAGING — CT CT HEAD CODE STROKE
3 series · 15 of 47 positions shown, 18 images · non-contrast
Comparison: None.

CLINICAL DATA: Code stroke.  Aphasia and dysarthria

EXAM:
CT HEAD WITHOUT CONTRAST
TECHNIQUE: Contiguous axial images were obtained from the base of the skull
through the vertex without intravenous contrast.

[Series 3: head 5.0 st · axial · 0.43mm/px · z∈[-219,-79]mm · 9 of 34 slices shown, 12 images]
[im 3/34  brain]
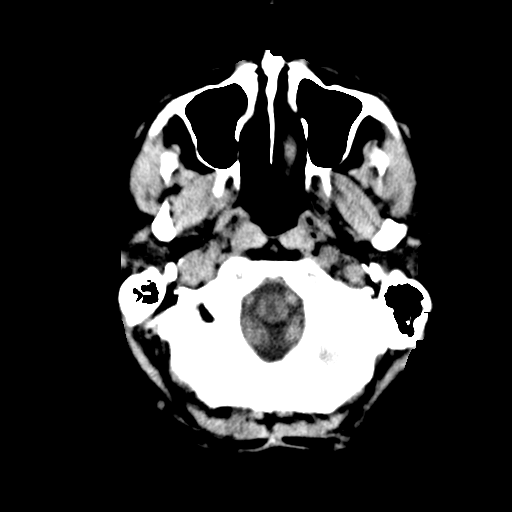
[im 3/34  bone]
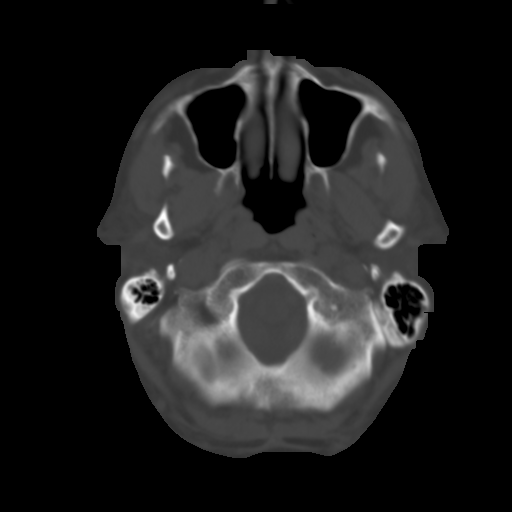
[im 6/34  brain]
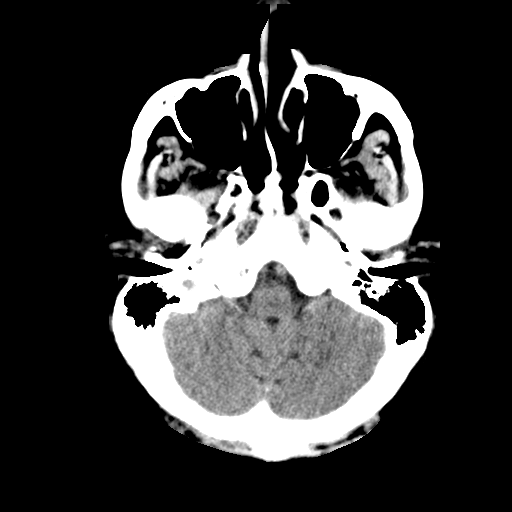
[im 10/34  brain]
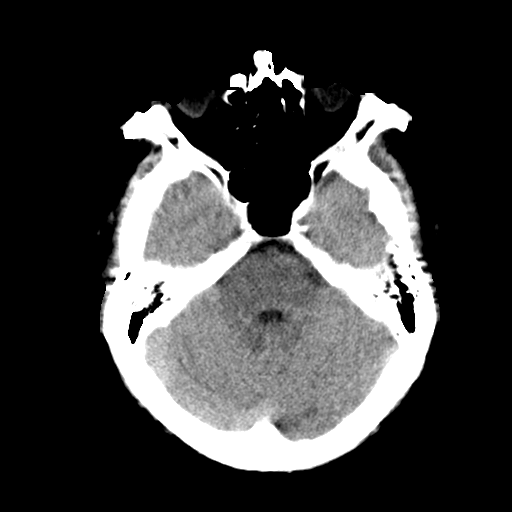
[im 13/34  brain]
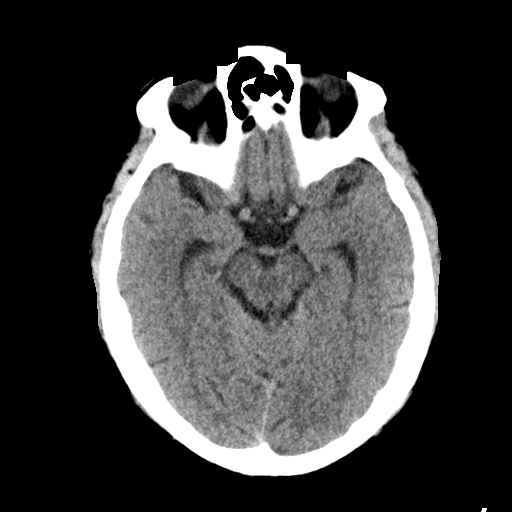
[im 18/34  brain]
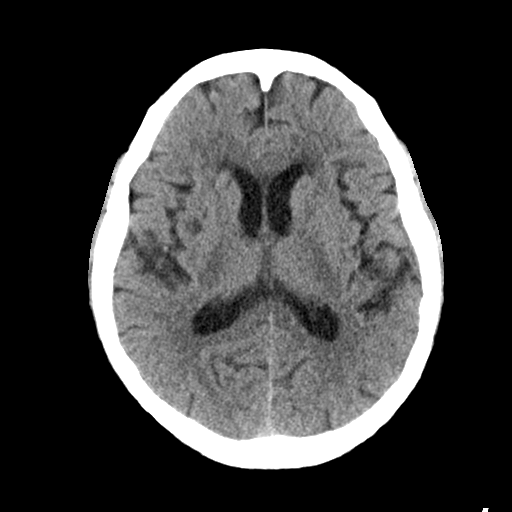
[im 18/34  bone]
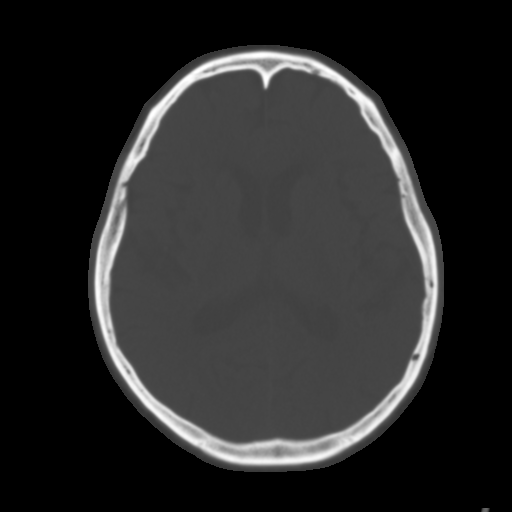
[im 21/34  brain]
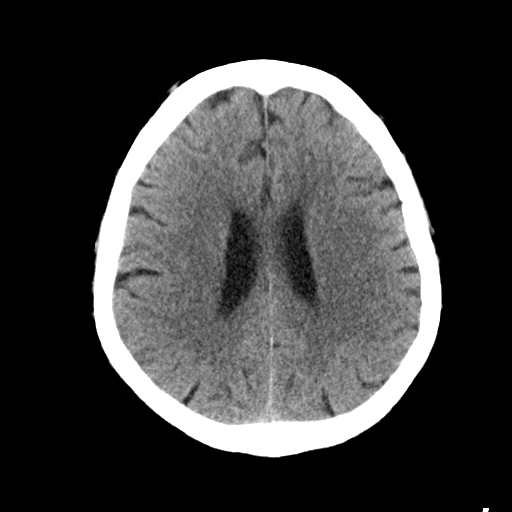
[im 24/34  brain]
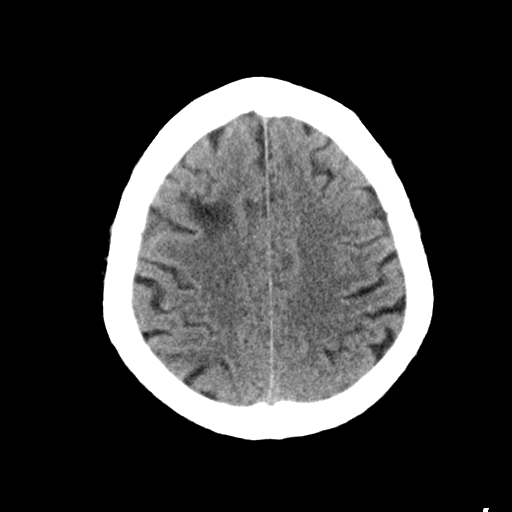
[im 28/34  brain]
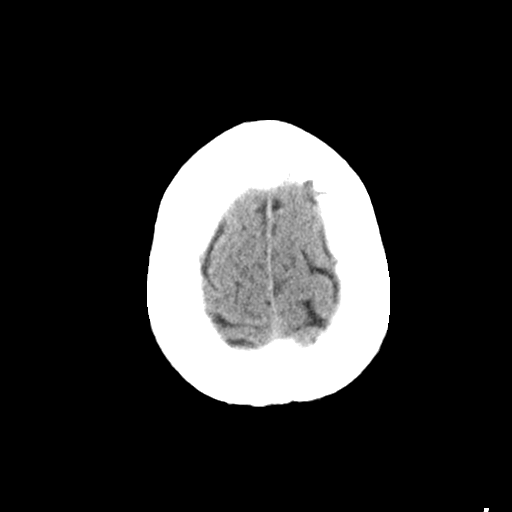
[im 31/34  brain]
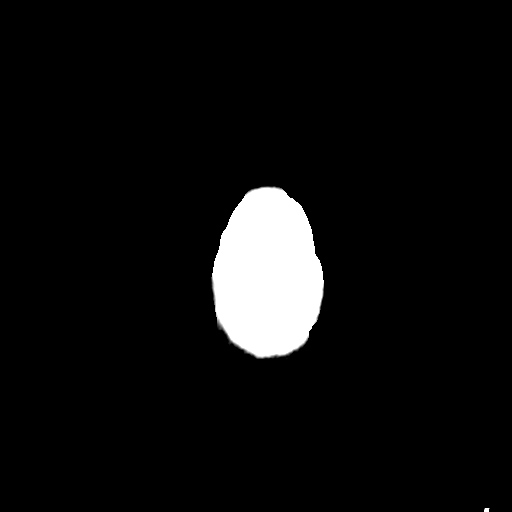
[im 31/34  bone]
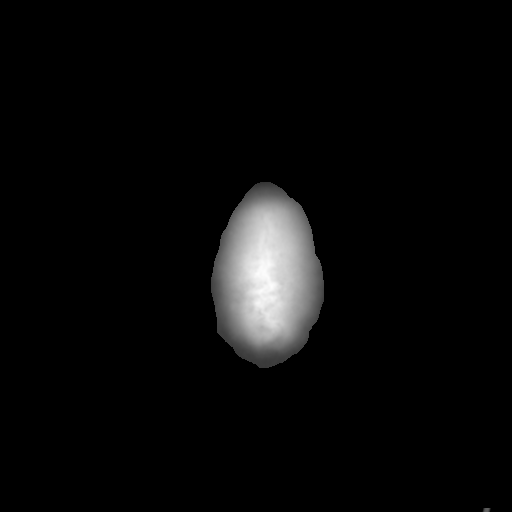

[Series 5: head 3.0 cor st · coronal · 0.32mm/px · 3 of 76 slices shown]
[im 26/76  brain]
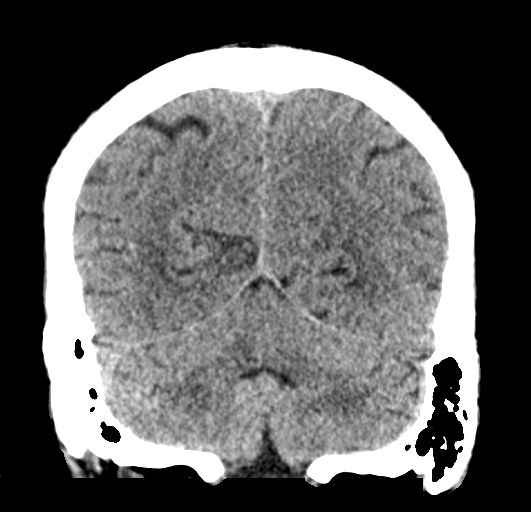
[im 34/76  brain]
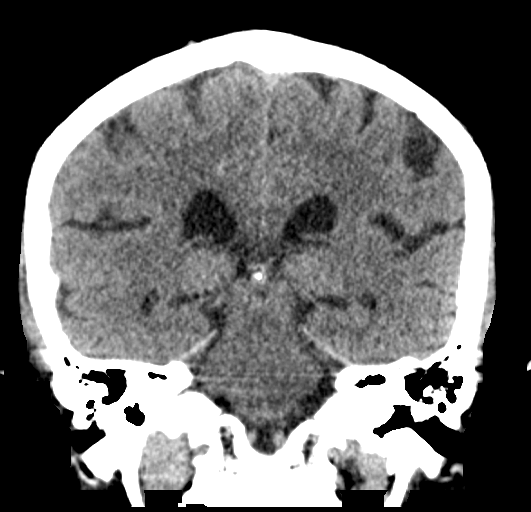
[im 42/76  brain]
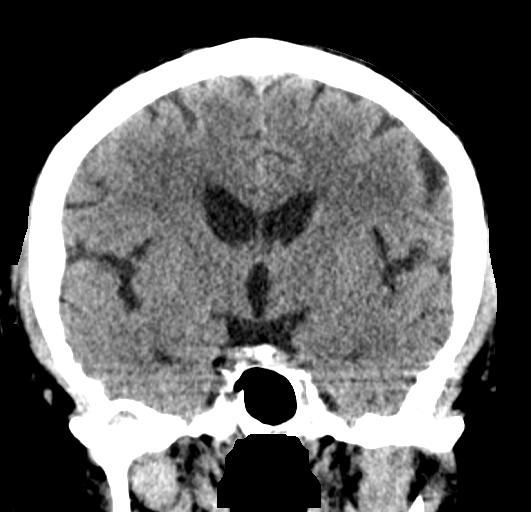

[Series 6: head 3.0 sag st · sagittal · 0.32mm/px · 3 of 60 slices shown]
[im 20/60  brain]
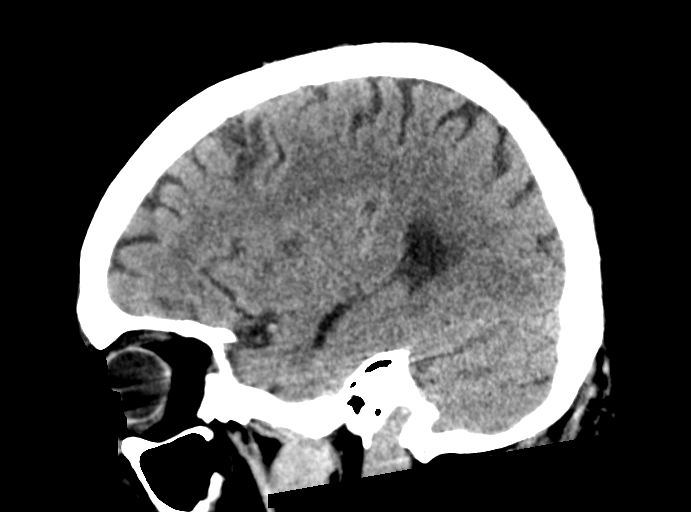
[im 30/60  brain]
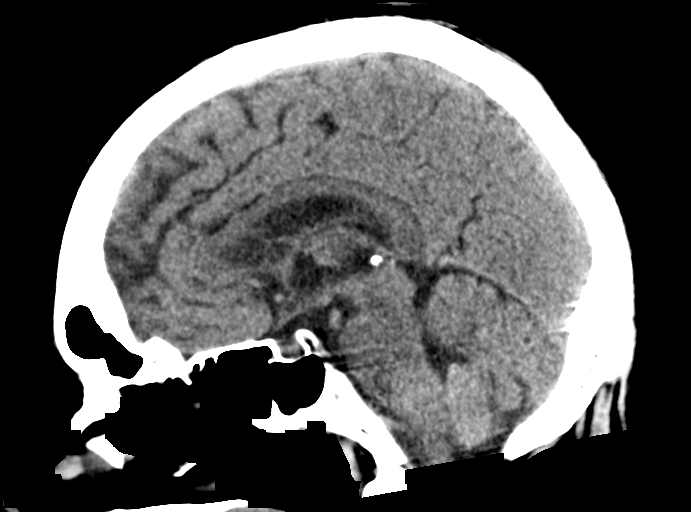
[im 40/60  brain]
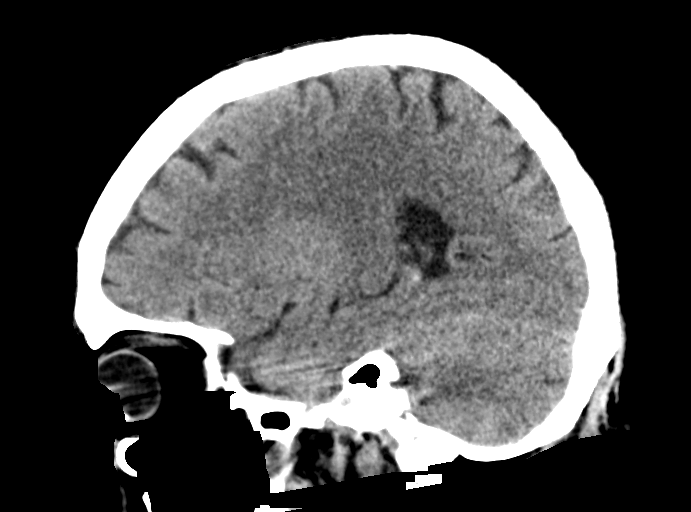

[15 of 47 positions shown; findings below may reference images not displayed]

FINDINGS: Brain: There is no mass, hemorrhage or extra-axial collection. The
size and configuration of the ventricles and extra-axial CSF spaces
are normal. Subcortical hypoattenuation within the right frontal
lobe, likely a chronic ischemic insult. Old small vessel infarcts in
the right subinsular region.

Vascular: No abnormal hyperdensity of the major intracranial
arteries or dural venous sinuses. No intracranial atherosclerosis.

Skull: The visualized skull base, calvarium and extracranial soft
tissues are normal.

Sinuses/Orbits: No fluid levels or advanced mucosal thickening of
the visualized paranasal sinuses. No mastoid or middle ear effusion.
The orbits are normal.

ASPECTS (Alberta Stroke Program Early CT Score)

- Ganglionic level infarction (caudate, lentiform nuclei, internal
capsule, insula, M1-M3 cortex): 7

- Supraganglionic infarction (M4-M6 cortex): 3

Total score (0-10 with 10 being normal): 10
IMPRESSION: 1. No acute intracranial abnormality.
2. ASPECTS is 10.
3. Chronic ischemic insult in the right frontal lobe and right
subinsular region.

These results were communicated to Dr. Gemelita Gastelbondo at [DATE] on
01/22/2021 by text page via the [employer] messaging system.

## 2022-10-13 IMAGING — MR MR HEAD W/O CM
12 of 13 series · 44 of 48 positions shown · non-contrast
Comparison: CT and CTA from earlier today

CLINICAL DATA: TIA.  Aphasia

EXAM:
MRI HEAD WITHOUT CONTRAST
TECHNIQUE: Multiplanar, multiecho pulse sequences of the brain and surrounding
structures were obtained without intravenous contrast.

[Series 5: DWI · axial · 3.0mm · 0.88mm/px · z∈[-79,+61]mm · 8 of 96 slices shown (1 of 4)]
[im 1/96]
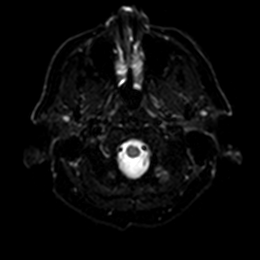
[im 14/96]
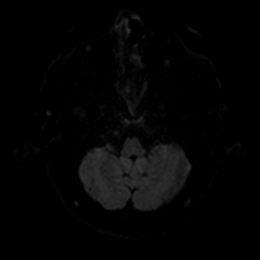
[im 28/96]
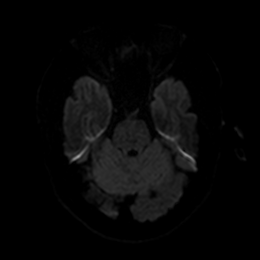
[im 41/96]
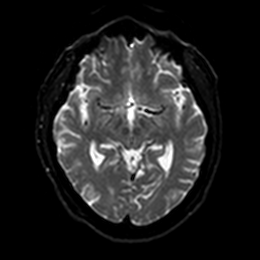
[im 55/96]
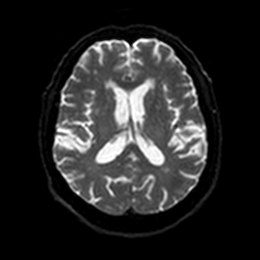
[im 68/96]
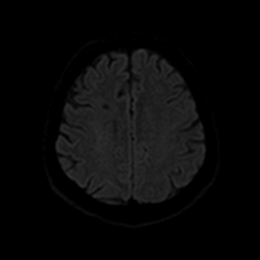
[im 82/96]
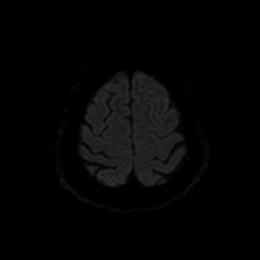
[im 96/96]
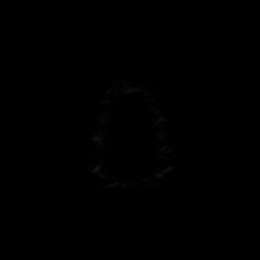

[Series 6: DWI · axial · 3.0mm · 0.88mm/px · z∈[-79,+61]mm · 4 of 48 slices shown (2 of 4)]
[im 1/48]
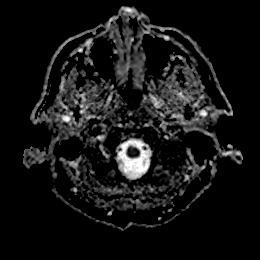
[im 16/48]
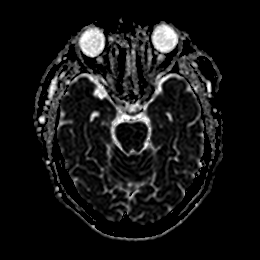
[im 32/48]
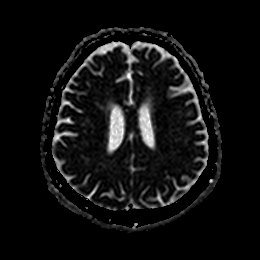
[im 48/48]
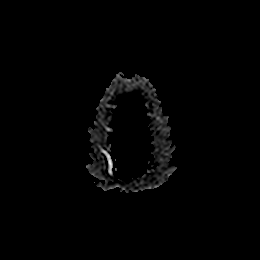

[Series 7: DWI · coronal · 4.0mm · 0.88mm/px · 5 of 66 slices shown (3 of 4)]
[im 1/66]
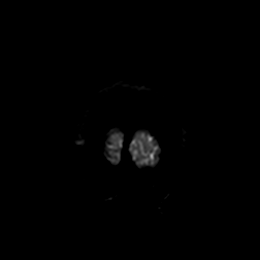
[im 17/66]
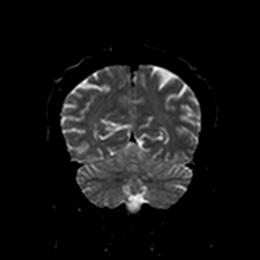
[im 33/66]
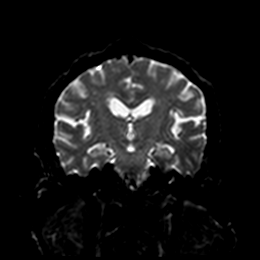
[im 49/66]
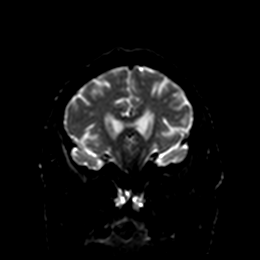
[im 66/66]
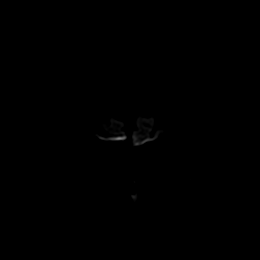

[Series 8: DWI · coronal · 4.0mm · 0.88mm/px · 3 of 33 slices shown (4 of 4)]
[im 1/33]
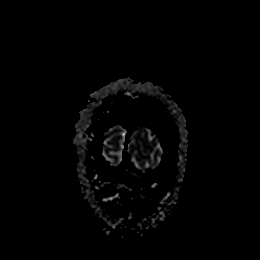
[im 17/33]
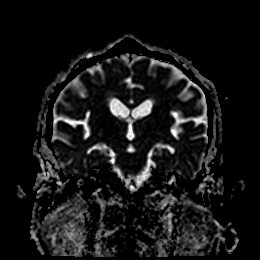
[im 33/33]
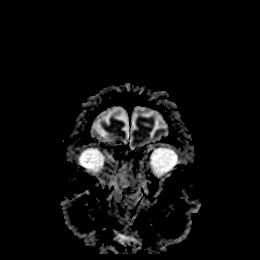

[Series 9: T1 · sagittal · 5.0mm · 0.75mm/px · 2 of 23 slices shown]
[im 1/23]
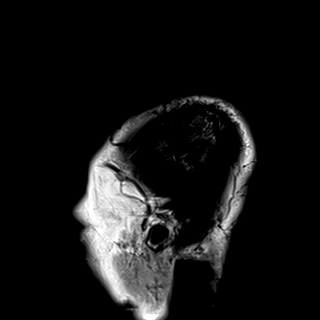
[im 23/23]
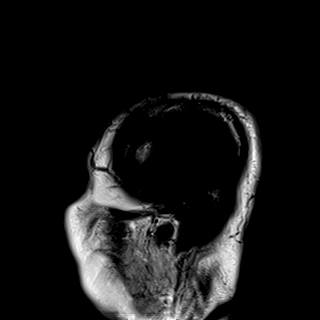

[Series 10: T2 · axial · 5.0mm · 0.72mm/px · z∈[-89,+59]mm · 2 of 26 slices shown (1 of 2)]
[im 1/26]
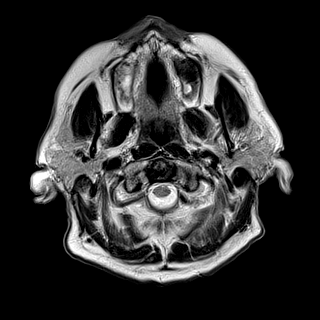
[im 26/26]
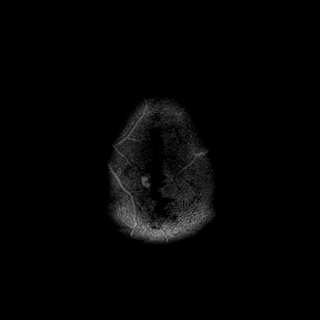

[Series 11: FLAIR · axial · 5.0mm · 0.45mm/px · z∈[-90,+58]mm · 2 of 26 slices shown]
[im 1/26]
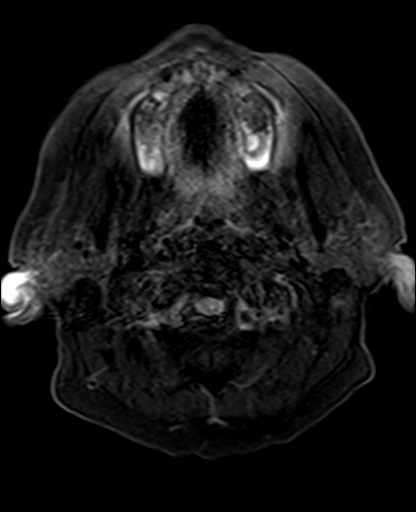
[im 26/26]
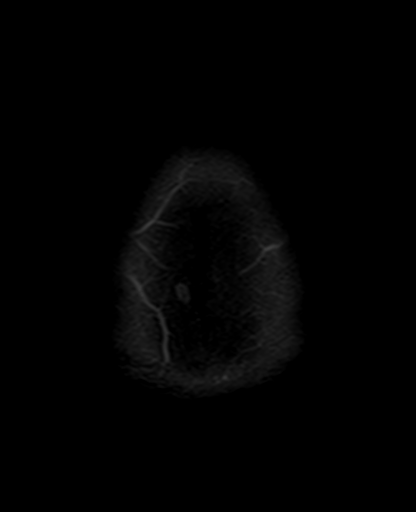

[Series 12: mag_images · axial · 3.0mm · 0.90mm/px · z∈[-98,+65]mm · 4 of 56 slices shown]
[im 1/56]
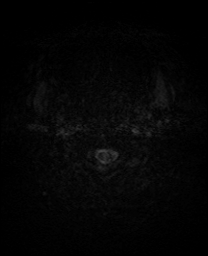
[im 19/56]
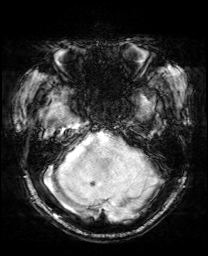
[im 37/56]
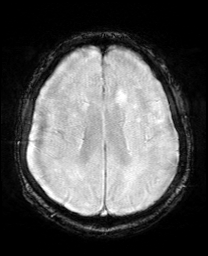
[im 56/56]
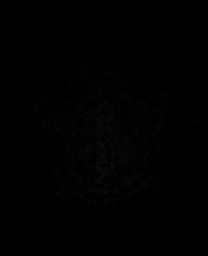

[Series 13: pha_images · axial · 3.0mm · 0.90mm/px · z∈[-92,+65]mm · 4 of 53 slices shown]
[im 1/53]
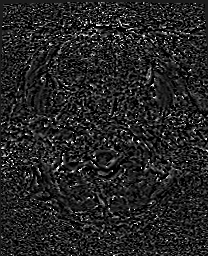
[im 18/53]
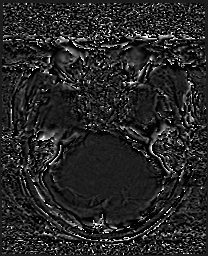
[im 35/53]
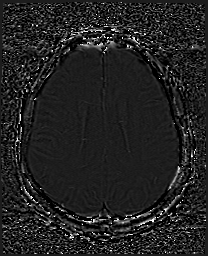
[im 53/53]
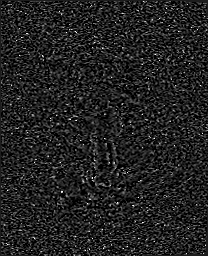

[Series 14: swi_images · axial · 3.0mm · 0.90mm/px · z∈[-98,+65]mm · 4 of 56 slices shown]
[im 1/56]
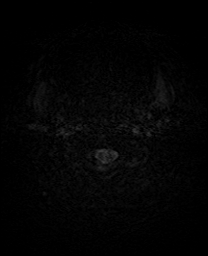
[im 19/56]
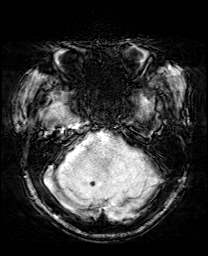
[im 37/56]
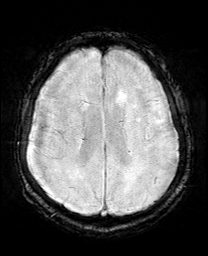
[im 56/56]
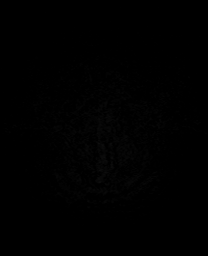

[Series 15: mip_images(sw) · axial · 24.0mm · 0.90mm/px · z∈[-88,+55]mm · 4 of 49 slices shown]
[im 1/49]
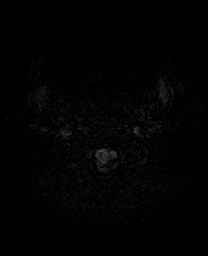
[im 17/49]
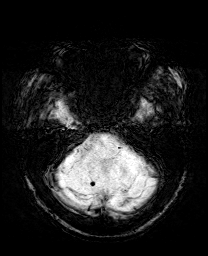
[im 33/49]
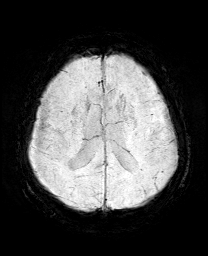
[im 49/49]
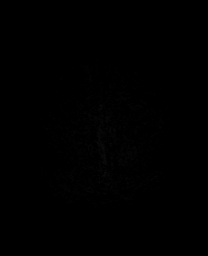

[Series 17: T2 · coronal · 5.0mm · 0.34mm/px · 2 of 29 slices shown (2 of 2)]
[im 1/29]
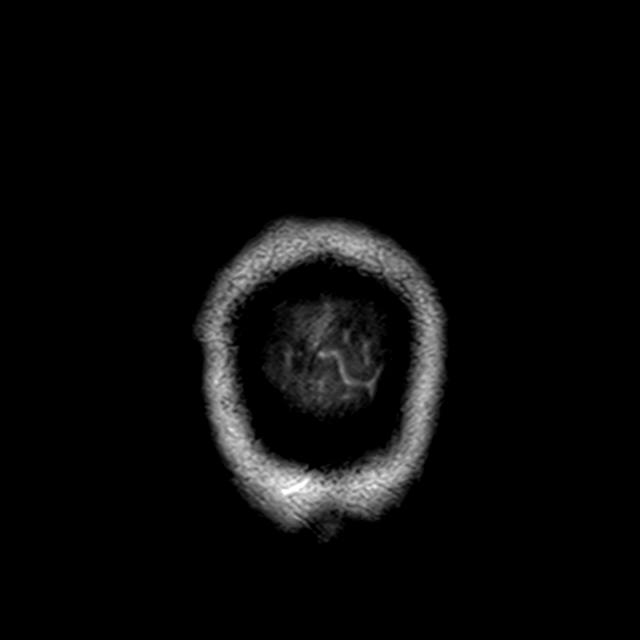
[im 29/29]
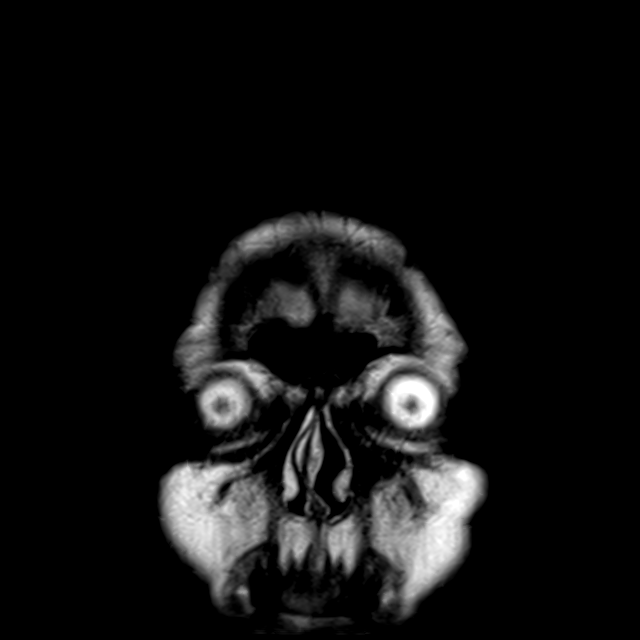

[44 of 48 positions shown; findings below may reference images not displayed]

FINDINGS: Brain: No acute infarction, hemorrhage, hydrocephalus, extra-axial
collection or mass lesion. Chronic small vessel ischemia in the
cerebral white matter to a mild degree. Small remote right frontal
infarct affecting anterior cortex and subjacent white matter.
Chronic right basal ganglia lacunes. Small remote left cerebellar
infarction. Remote micro hemorrhages in the left centrum semiovale,
right cerebellum, and left low parietal lobe. No generalized
cortical involvement for amyloid angiopathy.

Vascular: Preserved flow voids. Notable ICA tortuosity below the
skull base.

Skull and upper cervical spine: No focal marrow lesion

Sinuses/Orbits: Negative
IMPRESSION: 1. No acute finding.
2. Chronic small vessel disease and small remote right frontal lobe
infarct.

## 2022-10-20 ENCOUNTER — Telehealth: Payer: Self-pay | Admitting: Interventional Cardiology

## 2022-10-20 NOTE — Telephone Encounter (Signed)
Wife calling to see what dr, dr Tamala Julian will recommend him to see. Please advise

## 2022-10-20 NOTE — Telephone Encounter (Signed)
Spoke with patient's wife [name] (OK per [facility]) and discussed Dr. Thompson Caul recommendations for who to follow-up with upon his retirement. Provided the names of the 3 providers Dr. Tamala Julian recommends: Candee Furbish, MD or Rudean Haskell, MD or Gwyndolyn Kaufman, MD.  Patient overdue for 9-12 month follow-up.  Offered next available appt with Dr. Candee Furbish on 11/10/2022 at 2:00PM, [name] accepted the appointment for patient.  Jane expressed appreciation for assistance.

## 2022-10-25 DIAGNOSIS — M47816 Spondylosis without myelopathy or radiculopathy, lumbar region: Secondary | ICD-10-CM | POA: Diagnosis not present

## 2022-11-10 ENCOUNTER — Encounter: Payer: Self-pay | Admitting: Cardiology

## 2022-11-10 ENCOUNTER — Ambulatory Visit: Payer: Medicare Other | Attending: Cardiology | Admitting: Cardiology

## 2022-11-10 VITALS — BP 110/60 | HR 64 | Ht 70.0 in | Wt 218.0 lb

## 2022-11-10 DIAGNOSIS — Z95828 Presence of other vascular implants and grafts: Secondary | ICD-10-CM

## 2022-11-10 DIAGNOSIS — Z951 Presence of aortocoronary bypass graft: Secondary | ICD-10-CM | POA: Diagnosis not present

## 2022-11-10 DIAGNOSIS — I5022 Chronic systolic (congestive) heart failure: Secondary | ICD-10-CM

## 2022-11-10 NOTE — Progress Notes (Signed)
Cardiology Office Note:    Date:  11/10/2022   ID:  Patrick Nash., DOB 1950/11/25, MRN [id number]  PCP:  Mayra Neer, MD   [facility] Providers Cardiologist:  Sinclair Grooms, MD     Referring MD: Mayra Neer, MD    History of Present Illness:    Patrick Cali. is a 72 y.o. 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   Echocardiogram now shows EF 50 to 55%.  He recounted the story of Dr. Tamala Julian who could not sleep that night and came back into the hospital and when he woke up from his surgery he was sitting next to him.  Impressed he and his wife so much.  RFA of back for pain. Helping him with sleep but still has pain doing things.   Seizures/mini stroke 2021 and 2 seizures.   No tob  Wheezing at night. [place] but did not smoke  Past Medical History:  Diagnosis Date   Cancer ([place])    skin   DJD (degenerative joint disease), lumbar    DVT (deep venous thrombosis) ([facility])    Dysrhythmia    Family history of adverse reaction to anesthesia    Glaucoma    R eye diminished vision approx 50%   High cholesterol    Hyperlipidemia    Hypertension    IFG (impaired fasting glucose)    OSA (obstructive sleep apnea)    Severe w AHI 34/hr on HST no on BiPAP at 19/15cm H2O   PE (pulmonary embolism)    Second occurance,enknown etiology,lifelong anticoagulation    Past Surgical History:  Procedure Laterality Date   CARDIAC CATHETERIZATION N/A 05/14/2016   Procedure: Left Heart Cath and Coronary Angiography;  Surgeon: Belva Crome, MD;  Location: [facility];  Service: Cardiovascular;  Laterality: N/A;   CORONARY ARTERY BYPASS GRAFT N/A 05/14/2016    Procedure: CORONARY ARTERY BYPASS GRAFTING (CABG) times two with LIMA to LAD and right leg SVG to PDA,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;  Surgeon: Ivin Poot, MD;  Location: [facility];  Service: Open Heart Surgery;  Laterality: N/A;   EXCISIONAL TOTAL KNEE ARTHROPLASTY WITH ANTIBIOTIC SPACERS Right 05/26/2021   Procedure: EXCISIONAL TOTAL KNEE ARTHROPLASTY WITH ANTIBIOTIC SPACERS;  Surgeon: Rod Can, MD;  Location: [facility];  Service: Orthopedics;  Laterality: Right;   KNEE SURGERY Bilateral    arthroscopic   TOTAL KNEE ARTHROPLASTY Bilateral 1996   TOTAL KNEE REVISION Right 09/22/2021   Procedure: RIGHT KNEE REMOVAL SPACER TOTAL KNEE REVISION;  Surgeon: Rod Can, MD;  Location: [facility];  Service: Orthopedics;  Laterality: Right;  210    Current Medications: Current Meds  Medication Sig   acetaminophen (TYLENOL) 650 MG CR tablet Take 650 mg by mouth every 8 (eight) hours as needed for pain.   allopurinol (ZYLOPRIM) 300 MG tablet Take 300 mg by mouth daily.    amoxicillin (AMOXIL) 500 MG tablet Take 2,000 mg by mouth See admin instructions. 1 hour prior to dental appointment   carvedilol (COREG) 3.125 MG tablet Take 1 tablet by mouth twice daily   Cetirizine HCl (ZYRTEC ALLERGY) 10 MG CAPS as needed (allergies).   dapagliflozin propanediol (FARXIGA) 10 MG TABS  tablet Take 1 tablet (10 mg total) by mouth daily before breakfast.   diclofenac Sodium (VOLTAREN) 1 % GEL as needed (pain).   docusate sodium (COLACE) 100 MG capsule Take 1 capsule (100 mg total) by mouth 2 (two) times daily. (Patient taking differently: Take 100 mg by mouth as needed for mild constipation.)   dorzolamide-timolol (COSOPT) 2-0.5 % ophthalmic solution 2 (two) times daily.   gabapentin (NEURONTIN) 100 MG capsule Take 1 capsule by mouth daily.   levETIRAcetam (KEPPRA) 500 MG tablet Take 1 tablet (500 mg total) by mouth 2 (two) times daily.   losartan (COZAAR) 25 MG tablet Take 25 mg by mouth daily.    Multiple Vitamins-Minerals (PRESERVISION AREDS 2) CAPS 2 (two) times daily.   rivaroxaban (XARELTO) 20 MG TABS tablet Take 1 tablet (20 mg total) by mouth daily with supper.   sertraline (ZOLOFT) 100 MG tablet Take 150 mg by mouth daily.   simvastatin (ZOCOR) 40 MG tablet Take 1 tablet (40 mg total) by mouth at bedtime.   spironolactone (ALDACTONE) 25 MG tablet Take 0.5 tablets (12.5 mg total) by mouth daily.   traMADol (ULTRAM) 50 MG tablet Take 50 mg by mouth as needed for moderate pain or severe pain.     Allergies:   Oxycodone   Social History   Socioeconomic History   Marital status: Married    Spouse name: Not on file   Number of children: Not on file   Years of education: Not on file   Highest education level: Not on file  Occupational History   Not on file  Tobacco Use   Smoking status: Never   Smokeless tobacco: Never  Vaping Use   Vaping Use: Never used  Substance and Sexual Activity   Alcohol use: Yes    Comment: 1-2 per week   Drug use: Never   Sexual activity: Not on file  Other Topics Concern   Not on file  Social History Narrative   Not on file   Social Determinants of Health   Financial Resource Strain: Not on file  Food Insecurity: Not on file  Transportation Needs: Not on file  Physical Activity: Not on file  Stress: Not on file  Social Connections: Not on file     Family History: The patient's family history includes CAD in his father; Hypertension in his father; Osteoporosis in his mother.  ROS:   Please see the history of present illness.     All other systems reviewed and are negative.  EKGs/Labs/Other Studies Reviewed:    The following studies were reviewed today:  ECHO 2022:   1. Left ventricular ejection fraction, by estimation, is 50 to 55%. The  left ventricle has low normal function. The left ventricle demonstrates  regional wall motion abnormalities. The basal-to-mid inferior wall is  severely hypokinetic. There is mild   concentric left ventricular hypertrophy. Left ventricular diastolic  parameters are consistent with Grade II diastolic dysfunction  (pseudonormalization).   2. Right ventricular systolic function is normal. The right ventricular  size is normal.   3. The mitral valve is normal in structure. Trivial mitral valve  regurgitation.   4. The aortic valve is tricuspid. There is mild calcification of the  aortic valve. There is mild thickening of the aortic valve. Aortic valve  regurgitation is mild. Aortic valve sclerosis/calcification is present,  without any evidence of aortic  stenosis.   5. Left atrial size was mildly dilated.   Comparison(s): Compared to prior TTE on 01/22/2021, the  LVEF has improved  from 30-35% to 50-55% and wall motion has improved.     EKG: No new  Recent Labs: 12/29/2021: ALT 14; BUN 21; Creatinine, Ser 1.00; Hemoglobin 11.9; Platelets 212; Potassium 4.2; Sodium 139  Recent Lipid Panel    Component Value Date/Time   CHOL 184 01/22/2021 0430   CHOL 165 10/21/2020 0932   TRIG 472 (H) 01/22/2021 0430   HDL 34 (L) 01/22/2021 0430   HDL 49 10/21/2020 0932   CHOLHDL 5.4 01/22/2021 0430   VLDL UNABLE TO CALCULATE IF TRIGLYCERIDE OVER 400 mg/dL 01/22/2021 0430   LDLCALC UNABLE TO CALCULATE IF TRIGLYCERIDE OVER 400 mg/dL 01/22/2021 0430   LDLCALC 97 10/21/2020 0932   LDLDIRECT 55.6 01/22/2021 0430     Risk Assessment/Calculations:               Physical Exam:    VS:  BP 110/60   Pulse 64   Ht '5\' 10"'$  (1.778 m)   Wt 218 lb (98.9 kg)   SpO2 96%   BMI 31.28 kg/m     Wt Readings from Last 3 Encounters:  11/10/22 218 lb (98.9 kg)  05/09/22 228 lb 8 oz (103.6 kg)  03/07/22 209 lb (94.8 kg)     GEN:  Well nourished, well developed in no acute distress HEENT: Normal NECK: No JVD; No carotid bruits LYMPHATICS: No lymphadenopathy CARDIAC: CABG scar RRR, no murmurs, rubs, gallops RESPIRATORY:  Clear to auscultation without rales, wheezing or rhonchi   ABDOMEN: Soft, non-tender, non-distended MUSCULOSKELETAL:  No edema; No deformity  SKIN: Warm and dry NEUROLOGIC:  Alert and oriented x 3 PSYCHIATRIC:  Normal affect   ASSESSMENT:    1. S/P CABG x 2   2. Chronic systolic heart failure ([place])   3. Hx of ascending aorta replacement    PLAN:    In order of problems listed above:  CABG ascending aorta dissection repair -Doing well Dr. Darcey Nora stable.  Ischemic cardiomyopathy - 35% EF up to 50 to 55% EF excellent.  Continuing with goal-directed medical therapy which includes ARB SGLT2 inhibitor MRI as well as beta-blocker.  Chronic anticoagulation secondary to history of DVT PE - Continue with Xarelto no changes.  Every 6 months lab work.  Last in November 2023 hemoglobin 12.6 creatinine 1.0  Hyperlipidemia - Continue with simvastatin 40 mg.  No side effects.  1 year follow-up.          Medication Adjustments/Labs and Tests Ordered: Current medicines are reviewed at length with the patient today.  Concerns regarding medicines are outlined above.  No orders of the defined types were placed in this encounter.  No orders of the defined types were placed in this encounter.   Patient Instructions  Medication Instructions:  The current medical regimen is effective;  continue present plan and medications.  *If you need a refill on your cardiac medications before your next appointment, please call your pharmacy*  Follow-Up: At [facility], you and your health needs are our priority.  As part of our continuing mission to provide you with exceptional heart care, we have created designated Provider Care Teams.  These Care Teams include your primary Cardiologist (physician) and Advanced Practice Providers (APPs -  Physician Assistants and Nurse Practitioners) who all work together to provide you with the care you need, when you need it.  We recommend signing up for the patient portal called "MyChart".  Sign up  information is provided on this After Visit Summary.  MyChart is used  to connect with patients for Virtual Visits (Telemedicine).  Patients are able to view lab/test results, encounter notes, upcoming appointments, etc.  Non-urgent messages can be sent to your provider as well.   To learn more about what you can do with MyChart, go to [email or web address].    Your next appointment:   1 year(s)  Provider:   Dr Candee Furbish        Signed, Candee Furbish, MD  11/10/2022 2:54 PM    Patrick Fritz

## 2022-11-10 NOTE — Patient Instructions (Signed)
Medication Instructions:  The current medical regimen is effective;  continue present plan and medications.  *If you need a refill on your cardiac medications before your next appointment, please call your pharmacy*   Follow-Up: At [facility], you and your health needs are our priority.  As part of our continuing mission to provide you with exceptional heart care, we have created designated Provider Care Teams.  These Care Teams include your primary Cardiologist (physician) and Advanced Practice Providers (APPs -  Physician Assistants and Nurse Practitioners) who all work together to provide you with the care you need, when you need it.  We recommend signing up for the patient portal called "MyChart".  Sign up information is provided on this After Visit Summary.  MyChart is used to connect with patients for Virtual Visits (Telemedicine).  Patients are able to view lab/test results, encounter notes, upcoming appointments, etc.  Non-urgent messages can be sent to your provider as well.   To learn more about what you can do with MyChart, go to [email or web address].    Your next appointment:   1 year(s)  Provider:   Dr Mark Skains      

## 2022-11-14 NOTE — Progress Notes (Unsigned)
[facility] [address] [place]. [place] [address] [phone]       OFFICE FOLLOW UP NOTE  Patrick Fritz. Date of Birth:  1951-01-25 Medical Record Number:  [id number]   Reason for visit: Seizure and stroke follow up    SUBJECTIVE:   CHIEF COMPLAINT:  No chief complaint on file.   HPI:   Update 11/15/2022 JM: Patient returns for 42-monthstroke and seizure follow-up accompanied by his wife.  Overall stable since prior visit.  No new stroke/TIA symptoms or seizure activity.  Compliant on Keppra 500 mg twice daily.  Compliant on Xarelto and simvastatin.  Blood pressure well-controlled.  [facility]  At prior visit, c/o coughing after swallowing foods and liquids.  Order placed for MBS but not yet completed.     History provided for reference purposes only Update 05/09/2022 JM: Patient returns for 458-montheizure and stroke follow-up accompanied by his wife.   Has been stable since prior visit without any additional seizure activity.  Remains on Keppra 500 mg twice daily, denies side effects.  Completed MRI which did not show any new or acute findings but did note redemonstrated small chronic cortical/subcortical infarct within the mid right frontal lobe which could reflect a seizure focus.  He does note over the past couple of months coughing after swallowing food and liquids. At times, can cough so bad he will vomit. Can occur with any type of food and drink.  Does not have difficulty initiating swallowing.  Denies any issues with swallowing in the past or hx of esophageal issues.  Denies any issues with acid reflux or history of acid reflux.  Denies any new or reoccurring stroke/TIA symptoms Compliant on Xarelto and simvastatin, denies side effects Blood pressure today 112/58  Reports continued left hand pain and at times right hand. [facility]  started him on colchicine for possible acute gout flare with some improvement of pain, has f/u in a couple weeks. He also notes chronic back pain and is scheduled to see Dr. ElEllene Routehis week.  Completed sleep study 02/2022 with Dr. TuRadford Paxhich showed mild OSA with total AHI 13/h and recommended therapeutic CPAP titration.    No further concerns at this time  Update 01/02/2022 JM: Patient returns due to recent seizure activity and hospital follow-up.  He is accompanied by his wife.  He has not been seen in over 10 months although requested 6 mo follow up after he was seen for TIA versus seizure activity.  He presented to ED on 3/16 with seizure-like activity and recurrent tonic-clonic seizure in ED receiving IV Ativan and IV Keppra load. Evaluated by neurologist Dr. KiLeonel Ramsay He was started on Keppra 500 mg twice daily. EEG mild diffuse encephalopathy but no seizures or epileptiform discharges.  Planned on completing MR brain outpatient and discharged home on 3/18.  Of note, back in 06/2021, wife called office reporting episode of his hands starting to jerk, head fell back and unable to speak for 1-2 minutes. He did not follow up as recommended.   He has been stable since discharge, no additional seizure activity. Does report some generalized fatigue since discharge but did have some fatigue prior due to chronic low back pain. Compliant on Keppra 500 mg twice daily, denies side effects. He has not yet completed MRI - wife was under the impression this would be done today at appointment. Wife asks  multiple appropriate questions regarding recent events and prior imaging.   Initial visit 02/22/2021 JM: Patrick Fritz is being seen for hospital follow-up accompanied by his wife, Patrick Fritz  Doing well from stroke standpoint without new or reoccurring symptoms.  He does report occasional short-term memory difficulty or delayed recall but this has been on going C/o intermittent dizziness with one episode of passing out  -cardiology aware who felt likely due to slight orthostasis with recent medication adjustments Also reports right leg calf and thigh pain as well as swelling that has been improving. Per wife, cardiology evaluated yesterday and no emergent concerns reported - defer ongoing monitoring to cardiology  Compliant on Xarelto and simvastatin without associated side effects Blood pressure today 114/67 - occasionally monitors at home   No further concerns at this time  Hospitalization 01/22/2021 [name] is a 72 y.o. male with history of of DJD of lumbar spine, frequent lower back pain, history of bilateral PE, history of DVT, glaucoma, hyperlipidemia, hypertension, impaired fasting glucose, OSA no longer on BiPAP, CAD/CABG (per patient required defibrillation during CABG) who presented  on 01/22/2021 with AMS and recent episode with dysarthria. Patient symptoms resolved by the time he presented to the ED and his AMS improved on his initial exam.  Personally reviewed hospitalization pertinent progress notes, lab work and imaging with summary provided.  CT head and MRI negative for acute stroke and symptoms likely in setting of TIA vs possible unwitnessed seizure with postictal confusion as symptoms lasted for approximately 2-2.5hrs and pt unable to recall episode.  EEG negative.  Recommended continuation of Xarelto for secondary stroke prevention with history of DVT and PE.  2D echo mild worsening of HFrEF from 40 to 45% in 2017 to now 30 to 35% -recommended outpatient follow-up with established cardiologist.  LDL 55.6 on simvastatin.  Evaluated by therapy and discharged home in stable condition without therapy needs.  Posterior circulation TIA vs unwitnessed seizure with post ictal presentation Code Stroke : CT head No acute abnormality. Small vessel disease. ASPECTS 10.  CTA head & neck: No high grade stenosis of the intracranial arteries and no Hemodynamically significant stenosis of bilateral  carotids CT perfusion: negative MRI : No acute findings, chronic small vessel dx and small remote frontal lobe infarct 2D Echo: LVEF 30-35%, LV regional wall abnormalities (septal and inferior wall hypokinesis), Mild AVR, L atrium mildy enlarged, Mild MVR, EEG no evidence of seizure activity LDL 55.6 HgbA1c 5.6 VTE prophylaxis - SCDs aspirin 81 mg daily and Xarelto (rivaroxaban) daily prior to admission will restart ,Xarelto (rivaroxaban) daily.  Additional aspirin not necessary from a stroke standpoint Therapy recommendations: none Disposition:  Home     ROS:   14 system review of systems performed and negative with exception of those listed in HPI  PMH:  Past Medical History:  Diagnosis Date   Cancer ([facility])    skin   DJD (degenerative joint disease), lumbar    DVT (deep venous thrombosis) ([facility])    Dysrhythmia    Family history of adverse reaction to anesthesia    Glaucoma    R eye diminished vision approx 50%   High cholesterol    Hyperlipidemia    Hypertension    IFG (impaired fasting glucose)    OSA (obstructive sleep apnea)    Severe w AHI 34/hr on HST no on BiPAP at 19/15cm H2O   PE (pulmonary embolism)    Second occurance,enknown etiology,lifelong anticoagulation    PSH:  Past Surgical History:  Procedure Laterality Date   CARDIAC CATHETERIZATION N/A 05/14/2016   Procedure: Left Heart Cath and Coronary Angiography;  Surgeon: Belva Crome, MD;  Location: [facility];  Service: Cardiovascular;  Laterality: N/A;   CORONARY ARTERY BYPASS GRAFT N/A 05/14/2016   Procedure: CORONARY ARTERY BYPASS GRAFTING (CABG) times two with LIMA to LAD and right leg SVG to PDA,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;  Surgeon: Ivin Poot, MD;  Location: [facility];  Service: Open Heart Surgery;  Laterality: N/A;   EXCISIONAL TOTAL KNEE ARTHROPLASTY WITH ANTIBIOTIC SPACERS Right 05/26/2021   Procedure: EXCISIONAL TOTAL KNEE ARTHROPLASTY WITH ANTIBIOTIC SPACERS;  Surgeon:  Rod Can, MD;  Location: [facility];  Service: Orthopedics;  Laterality: Right;   KNEE SURGERY Bilateral    arthroscopic   TOTAL KNEE ARTHROPLASTY Bilateral 1996   TOTAL KNEE REVISION Right 09/22/2021   Procedure: RIGHT KNEE REMOVAL SPACER TOTAL KNEE REVISION;  Surgeon: Rod Can, MD;  Location: [facility];  Service: Orthopedics;  Laterality: Right;  210    Social History:  Social History   Socioeconomic History   Marital status: Married    Spouse name: Not on file   Number of children: Not on file   Years of education: Not on file   Highest education level: Not on file  Occupational History   Not on file  Tobacco Use   Smoking status: Never   Smokeless tobacco: Never  Vaping Use   Vaping Use: Never used  Substance and Sexual Activity   Alcohol use: Yes    Comment: 1-2 per week   Drug use: Never   Sexual activity: Not on file  Other Topics Concern   Not on file  Social History Narrative   Not on file   Social Determinants of Health   Financial Resource Strain: Not on file  Food Insecurity: Not on file  Transportation Needs: Not on file  Physical Activity: Not on file  Stress: Not on file  Social Connections: Not on file  Intimate Partner Violence: Not on file    Family History:  Family History  Problem Relation Age of Onset   Osteoporosis Mother    Hypertension Father    CAD Father     Medications:   Current Outpatient Medications on File Prior to Visit  Medication Sig Dispense Refill   acetaminophen (TYLENOL) 650 MG CR tablet Take 650 mg by mouth every 8 (eight) hours as needed for pain.     allopurinol (ZYLOPRIM) 300 MG tablet Take 300 mg by mouth daily.      amoxicillin (AMOXIL) 500 MG tablet Take 2,000 mg by mouth See admin instructions. 1 hour prior to dental appointment     carvedilol (COREG) 3.125 MG tablet Take 1 tablet by mouth twice daily 180 tablet 1   Cetirizine HCl (ZYRTEC ALLERGY) 10 MG CAPS as needed (allergies).     dapagliflozin  propanediol (FARXIGA) 10 MG TABS tablet Take 1 tablet (10 mg total) by mouth daily before breakfast. 90 tablet 3   diclofenac Sodium (VOLTAREN) 1 % GEL as needed (pain).     docusate sodium (COLACE) 100 MG capsule Take 1 capsule (100 mg total) by mouth 2 (two) times daily. (Patient taking differently: Take 100 mg by mouth as needed for mild constipation.) 60 capsule 2   dorzolamide-timolol (COSOPT) 2-0.5 % ophthalmic solution 2 (two) times daily.     gabapentin (NEURONTIN) 100 MG capsule Take 1 capsule by mouth daily.     levETIRAcetam (KEPPRA) 500 MG  tablet Take 1 tablet (500 mg total) by mouth 2 (two) times daily. 180 tablet 3   losartan (COZAAR) 25 MG tablet Take 25 mg by mouth daily.     Multiple Vitamins-Minerals (PRESERVISION AREDS 2) CAPS 2 (two) times daily.     rivaroxaban (XARELTO) 20 MG TABS tablet Take 1 tablet (20 mg total) by mouth daily with supper. 30 tablet 2   sertraline (ZOLOFT) 100 MG tablet Take 150 mg by mouth daily.     simvastatin (ZOCOR) 40 MG tablet Take 1 tablet (40 mg total) by mouth at bedtime. 90 tablet 3   spironolactone (ALDACTONE) 25 MG tablet Take 0.5 tablets (12.5 mg total) by mouth daily. 45 tablet 3   traMADol (ULTRAM) 50 MG tablet Take 50 mg by mouth as needed for moderate pain or severe pain.     No current facility-administered medications on file prior to visit.    Allergies:   Allergies  Allergen Reactions   Oxycodone Nausea Only       OBJECTIVE:  Physical Exam  There were no vitals filed for this visit.   There is no height or weight on file to calculate BMI. No results found.   General: well developed, well nourished, pleasant elderly Caucasian male, seated, in no evident distress Head: head normocephalic and atraumatic.   Neck: supple with no carotid or supraclavicular bruits Cardiovascular: regular rate and rhythm, no murmurs Musculoskeletal: no deformity Skin:  no rash/petichiae Vascular:  Normal pulses all extremities    Neurologic Exam Mental Status: Awake and fully alert.  Fluent speech and language.  Oriented to place and time. Recent memory subjectively mildly impaired and remote memory intact. Attention span, concentration and fund of knowledge appropriate. Mood and affect appropriate.  Cranial Nerves: Pupils equal, briskly reactive to light. Extraocular movements full without nystagmus. Visual fields full to confrontation. Hearing intact. Facial sensation intact. Very slight left facial asymmetry. tongue, palate moves normally and symmetrically.  Motor: Normal bulk and tone. Normal strength in all tested extremity muscles  Sensory.: intact to touch , pinprick , position and vibratory sensation.  Coordination: Rapid alternating movements normal in all extremities slightly decreased left hand. Finger-to-nose and heel-to-shin performed accurately bilaterally.  Gait and Station: Arises from chair without difficulty. Stance is slightly hunched. Gait demonstrates slow cautious steps with decreased stride length and step height without use of assistive device.  Tandem walk and heel toe not attempted due to safety concerns Reflexes: 1+ and symmetric. Toes downgoing.          ASSESSMENT: Patrick Fritz. is a 72 y.o. year old male with posterior circulation TIA vs unwitnessed seizure with postictal state on 01/22/2021 after presenting with altered mental status and dysarthria.  Witnessed generalized tonic-clonic seizure 12/29/2021 possibly symptomatic from old left frontal stroke as seen on [facility].  Vascular risk factors include HLD, hx of PE and DVT on Xarelto, multiple prior strokes on imaging, HTN, OSA not on BiPAP, CHF and CAD s/p CABG.      PLAN:  Seizure likely from prior stroke:  continue Keppra 500 mg twice daily  -refill up-to-date EEG 3/16 diffuse encephalopathy, no seizures or epileptiform discharges  MRI brain 12/2021 no acute findings, high cortical/subcortical infarct within the mid right frontal  lobe which could reflect a seizure focus as well as chronic small vessel Patrick Fritz changes stable since prior imaging, redemonstrated chronic infarcts in the right BG and left cerebral hemisphere and mild generalized cerebral atrophy MR brain 01/2021 chronic right frontal infarct,  R BG lacunes, left cerebellar infarct and remote microhemorrhages Discussed avoidance of seizure provoking triggers, ensuring medication compliance and no driving for 6 months post seizure activity per [place] law (around 06/2022) Advised to call office with any recurrent seizure activity Prolonged discussion re: seizures, possible etiology and other pertinent education Hx of multiple strokes (per imaging, ?silent strokes) Continue current treatment regimen with Xarelto (hx of PE/DVT) and simvastatin.  Continue close PCP follow-up for aggressive stroke risk factor management including HTN with BP goal<130/90 and HLD with LDL goal<70 Stroke labs 03/2022: A1c 5.8, LDL 52 Dysphagia: Order placed for MBS for further evaluation. Will hold off on repeat MRI brain at this time as no other associated symptoms and neurological exam intact.  Hand pain: Suspect form of arthritis contributing to pain although noted some improvement of pain after initiating colchicine.  [facility]     Follow up in 6 months or call earlier if needed   CC:  PCP: Mayra Neer, MD     I spent 38 minutes of face-to-face and non-face-to-face time with patient and wife.  This included previsit chart review, lab review, study review, electronic health record documentation, patient and wife education and discussion regarding above diagnoses and treatment plan and answered all other questions to patient satisfaction  Frann Rider, [facility]  [facility] [address] [place], [place] [address]  Phone [phone] Fax [phone] Note: This document was prepared with digital dictation and  possible smart phrase technology. Any transcriptional errors that result from this process are unintentional.

## 2022-11-15 ENCOUNTER — Encounter: Payer: Self-pay | Admitting: Adult Health

## 2022-11-15 ENCOUNTER — Ambulatory Visit (INDEPENDENT_AMBULATORY_CARE_PROVIDER_SITE_OTHER): Payer: Medicare Other | Admitting: Adult Health

## 2022-11-15 VITALS — BP 135/63 | HR 63 | Ht 70.0 in | Wt 223.0 lb

## 2022-11-15 DIAGNOSIS — I69398 Other sequelae of cerebral infarction: Secondary | ICD-10-CM | POA: Diagnosis not present

## 2022-11-15 DIAGNOSIS — R569 Unspecified convulsions: Secondary | ICD-10-CM | POA: Diagnosis not present

## 2022-11-15 DIAGNOSIS — Z8673 Personal history of transient ischemic attack (TIA), and cerebral infarction without residual deficits: Secondary | ICD-10-CM

## 2022-11-15 MED ORDER — LEVETIRACETAM ER 500 MG PO TB24: 1000.0000 mg | ORAL_TABLET | Freq: Every day | ORAL | 11 refills | Status: DC

## 2022-11-15 NOTE — Patient Instructions (Addendum)
Continue Keppra but will change to XR (extended release) formulation - will you take 2 '500mg'$  XR tablets at night - please call or send MyChart message with any additional events   Continue Xarelto (rivaroxaban) daily  and simvastatin for secondary stroke prevention  Continue to follow up with PCP regarding blood pressure and cholesterol management  Maintain strict control of hypertension with blood pressure goal below 130/90 and cholesterol with LDL cholesterol (bad cholesterol) goal below 70 mg/dL.   Signs of a Stroke? Follow the BEFAST method:  Balance Watch for a sudden loss of balance, trouble with coordination or vertigo Eyes Is there a sudden loss of vision in one or both eyes? Or double vision?  Face: Ask the person to smile. Does one side of the face droop or is it numb?  Arms: Ask the person to raise both arms. Does one arm drift downward? Is there weakness or numbness of a leg? Speech: Ask the person to repeat a simple phrase. Does the speech sound slurred/strange? Is the person confused ? Time: If you observe any of these signs, call 911.     Followup in the future with me in 6 months or call earlier if needed     Thank you for coming to see [place] at [facility]. I hope we have been able to provide you high quality care today.  You may receive a patient satisfaction survey over the next few weeks. We would appreciate your feedback and comments so that we may continue to improve ourselves and the health of our patients.

## 2022-11-30 DIAGNOSIS — M5136 Other intervertebral disc degeneration, lumbar region: Secondary | ICD-10-CM | POA: Diagnosis not present

## 2022-11-30 DIAGNOSIS — M47816 Spondylosis without myelopathy or radiculopathy, lumbar region: Secondary | ICD-10-CM | POA: Diagnosis not present

## 2023-01-07 ENCOUNTER — Other Ambulatory Visit: Payer: Self-pay | Admitting: Adult Health

## 2023-01-10 ENCOUNTER — Other Ambulatory Visit: Payer: Self-pay

## 2023-01-10 MED ORDER — CARVEDILOL 3.125 MG PO TABS: 3.1250 mg | ORAL_TABLET | Freq: Two times a day (BID) | ORAL | 3 refills | Status: DC

## 2023-01-25 ENCOUNTER — Other Ambulatory Visit: Payer: Self-pay | Admitting: Family Medicine

## 2023-01-25 ENCOUNTER — Ambulatory Visit
Admission: RE | Admit: 2023-01-25 | Discharge: 2023-01-25 | Disposition: A | Discharge status: Home / Self Care | Payer: Medicare Other | Source: Ambulatory Visit | Attending: Family Medicine | Admitting: Family Medicine

## 2023-01-25 DIAGNOSIS — I509 Heart failure, unspecified: Secondary | ICD-10-CM | POA: Diagnosis not present

## 2023-01-25 DIAGNOSIS — R062 Wheezing: Secondary | ICD-10-CM

## 2023-01-25 DIAGNOSIS — R059 Cough, unspecified: Secondary | ICD-10-CM | POA: Diagnosis not present

## 2023-01-25 DIAGNOSIS — I2782 Chronic pulmonary embolism: Secondary | ICD-10-CM | POA: Diagnosis not present

## 2023-02-01 ENCOUNTER — Other Ambulatory Visit: Payer: Self-pay

## 2023-02-01 MED ORDER — DAPAGLIFLOZIN PROPANEDIOL 10 MG PO TABS: 10.0000 mg | ORAL_TABLET | Freq: Every day | ORAL | 2 refills | Status: DC

## 2023-02-01 NOTE — Telephone Encounter (Signed)
Pt's medication was sent to pt's pharmacy as requested. Confirmation received.  °

## 2023-03-19 DIAGNOSIS — I509 Heart failure, unspecified: Secondary | ICD-10-CM | POA: Diagnosis not present

## 2023-03-19 DIAGNOSIS — E782 Mixed hyperlipidemia: Secondary | ICD-10-CM | POA: Diagnosis not present

## 2023-03-19 DIAGNOSIS — M069 Rheumatoid arthritis, unspecified: Secondary | ICD-10-CM | POA: Diagnosis not present

## 2023-03-19 DIAGNOSIS — I25119 Atherosclerotic heart disease of native coronary artery with unspecified angina pectoris: Secondary | ICD-10-CM | POA: Diagnosis not present

## 2023-03-26 DIAGNOSIS — M5136 Other intervertebral disc degeneration, lumbar region: Secondary | ICD-10-CM | POA: Diagnosis not present

## 2023-03-26 DIAGNOSIS — M47816 Spondylosis without myelopathy or radiculopathy, lumbar region: Secondary | ICD-10-CM | POA: Diagnosis not present

## 2023-05-16 ENCOUNTER — Encounter: Payer: Self-pay | Admitting: Adult Health

## 2023-05-16 ENCOUNTER — Ambulatory Visit (INDEPENDENT_AMBULATORY_CARE_PROVIDER_SITE_OTHER): Payer: Medicare Other | Admitting: Adult Health

## 2023-05-16 VITALS — BP 126/74 | HR 58 | Ht 70.0 in | Wt 219.0 lb

## 2023-05-16 DIAGNOSIS — M79642 Pain in left hand: Secondary | ICD-10-CM

## 2023-05-16 DIAGNOSIS — Z8673 Personal history of transient ischemic attack (TIA), and cerebral infarction without residual deficits: Secondary | ICD-10-CM

## 2023-05-16 DIAGNOSIS — I69398 Other sequelae of cerebral infarction: Secondary | ICD-10-CM | POA: Diagnosis not present

## 2023-05-16 DIAGNOSIS — R569 Unspecified convulsions: Secondary | ICD-10-CM

## 2023-05-16 DIAGNOSIS — M79641 Pain in right hand: Secondary | ICD-10-CM | POA: Diagnosis not present

## 2023-05-16 MED ORDER — BRIVIACT 50 MG PO TABS: 50.0000 mg | ORAL_TABLET | Freq: Two times a day (BID) | ORAL | 5 refills | Status: DC

## 2023-05-16 NOTE — Patient Instructions (Addendum)
Recommend trying Briviact in place of Keppra - you will start Briviact at 50mg  twice daily and stop Keppra  Follow up with your rheumatologist regarding continued hand pain - if symptoms persist, can discuss with [facility], if they would like [place] to further evaluate, please have them place a referral to our office  Continue Xarelto and simvastatin for secondary stroke prevention measures  Continue to follow with your PCP and [facility] for stroke risk factor management     Follow up in 6 months or call earlier if needed

## 2023-05-16 NOTE — Progress Notes (Signed)
[facility] [address] [place]. [place] [address] [phone]       OFFICE FOLLOW UP NOTE  [name]. Date of Birth:  23-Jan-1951 Medical Record Number:  [id number]   Reason for visit: Seizure and stroke follow up    SUBJECTIVE:   CHIEF COMPLAINT:  Chief Complaint  Patient presents with   Follow-up    Patient in room #8 with wife. Patient states when he working on cars and having to get up from the ground he gets dizzy.    HPI:   Update 05/16/2023 JM: Patient returns for follow-up visit accompanied by his wife.  Denies any recurrent seizure activity.  Was switched from Keppra IR 500mg  BID to Keppra XR 1000mg  daily at prior visit due to changes in mood but no significant improvement. Wife does note since changing, he has been taking longer to get going in the morning.   Denies any new stroke/TIA symptoms.  Compliant on medications.  Routinely follows with PCP Dr. Clelia Croft as [facility]   Reports continued hand pain, right greater than left, routinely followed with rheumatology for rheumatoid arthritis.  Started on methotrexate 6 weeks ago and has follow-up next month.  He does note bilateral fingertip numbness/tingling, at times can have difficulty holding objects. Per wife, [place] spoke with him about possibly needing neuropathy workup.    History provided for reference purposes only Update 11/15/2022 JM: Patient returns for 74-month stroke and seizure follow-up accompanied by his wife.  Reports episode back in December after they recently moved and unpacking, wife asked him a question and he responded with "I don't know, I don't understand" and then was unable to speak for 1-2 minutes after and then returned back to baseline. No other symptoms. Did not lose consciousness, no postictal symptoms. Similar event occurred in November while in [employer]. No additional events since then. Reports compliance on Keppra 500 mg twice  daily. Patient reports increased stressors as they care for their grandchildren and is constantly worrying about them. Wife mentions that he has been more irritable, questions if this is from medications.    At prior visit, complained of coughing after swallowing foods and liquids, order placed for MBS but did not pursue as symptoms resolved, has not had any issues with this since then.   Denies any new stroke/TIA symptoms. Compliant on Xarelto and simvastatin.  Blood pressure well-controlled.  [facility]   Update 05/09/2022 JM: Patient returns for 74-month seizure and stroke follow-up accompanied by his wife.   Has been stable since prior visit without any additional seizure activity.  Remains on Keppra 500 mg twice daily, denies side effects.  Completed MRI which did not show any new or acute findings but did note redemonstrated small chronic cortical/subcortical infarct within the mid right frontal lobe which could reflect a seizure focus.  He does note over the past couple of months coughing after swallowing food and liquids. At times, can cough so bad he will vomit. Can occur with any type of food and drink.  Does not have difficulty initiating swallowing.  Denies any issues with swallowing in the past or hx of esophageal issues.  Denies any issues with acid reflux or history of acid reflux.  Denies any new or reoccurring stroke/TIA symptoms Compliant on Xarelto and simvastatin, denies side effects Blood pressure today 112/58  Reports continued left hand pain and at times right hand. He is being [facility] by [facility] for questionable rheumatoid arthritis who recently started him on colchicine for possible acute gout flare with some improvement of pain, has f/u in a couple weeks. He also notes chronic back pain and is scheduled to see Dr. Danielle Dess this week.  Completed sleep study 02/2022 with Dr. Mayford Knife which showed mild OSA with total AHI 13/h and recommended  therapeutic CPAP titration.    No further concerns at this time  Update 01/02/2022 JM: Patient returns due to recent seizure activity and hospital follow-up.  He is accompanied by his wife.  He has not been seen in over 10 months although requested 6 mo follow up after he was seen for TIA versus seizure activity.  He presented to ED on 3/16 with seizure-like activity and recurrent tonic-clonic seizure in ED receiving IV Ativan and IV Keppra load. Evaluated by neurologist Dr. Amada Jupiter.  He was started on Keppra 500 mg twice daily. EEG mild diffuse encephalopathy but no seizures or epileptiform discharges.  Planned on completing MR brain outpatient and discharged home on 3/18.  Of note, back in 06/2021, wife called office reporting episode of his hands starting to jerk, head fell back and unable to speak for 1-2 minutes. He did not follow up as recommended.   He has been stable since discharge, no additional seizure activity. Does report some generalized fatigue since discharge but did have some fatigue prior due to chronic low back pain. Compliant on Keppra 500 mg twice daily, denies side effects. He has not yet completed MRI - wife was under the impression this would be done today at appointment. Wife asks multiple appropriate questions regarding recent events and prior imaging.   Initial visit 02/22/2021 JM: Mr. Considine is being seen for hospital follow-up accompanied by his wife, Erskine Squibb  Doing well from stroke standpoint without new or reoccurring symptoms.  He does report occasional short-term memory difficulty or delayed recall but this has been on going C/o intermittent dizziness with one episode of passing out -cardiology aware who felt likely due to slight orthostasis with recent medication adjustments Also reports right leg calf and thigh pain as well as swelling that has been improving. Per wife, cardiology evaluated yesterday and no emergent concerns reported - defer ongoing monitoring to  cardiology  Compliant on Xarelto and simvastatin without associated side effects Blood pressure today 114/67 - occasionally monitors at home   No further concerns at this time  Hospitalization 01/22/2021 Mr. HARRIET WELL is a 73 y.o. male with history of of DJD of lumbar spine, frequent lower back pain, history of bilateral PE, history of DVT, glaucoma, hyperlipidemia, hypertension, impaired fasting glucose, OSA no longer on BiPAP, CAD/CABG (per patient required defibrillation during CABG) who presented  on 01/22/2021 with AMS and recent episode with dysarthria. Patient symptoms resolved by the time he presented to the ED and his AMS improved on his initial exam.  Personally reviewed hospitalization pertinent progress notes, lab work and imaging with summary provided.  CT head and MRI negative for acute stroke and symptoms likely in setting of TIA vs possible unwitnessed seizure with postictal confusion as symptoms lasted for approximately 2-2.5hrs and pt unable to recall episode.  EEG negative.  Recommended continuation of Xarelto for secondary stroke prevention with history of DVT and PE.  2D echo mild worsening of HFrEF from 40 to 45% in 2017 to now 30 to 35% -recommended outpatient follow-up with established cardiologist.  LDL 55.6 on simvastatin.  Evaluated by therapy and discharged home in stable condition  without therapy needs.  Posterior circulation TIA vs unwitnessed seizure with post ictal presentation Code Stroke : CT head No acute abnormality. Small vessel disease. ASPECTS 10.  CTA head & neck: No high grade stenosis of the intracranial arteries and no Hemodynamically significant stenosis of bilateral carotids CT perfusion: negative MRI : No acute findings, chronic small vessel dx and small remote frontal lobe infarct 2D Echo: LVEF 30-35%, LV regional wall abnormalities (septal and inferior wall hypokinesis), Mild AVR, L atrium mildy enlarged, Mild MVR, EEG no evidence of seizure  activity LDL 55.6 HgbA1c 5.6 VTE prophylaxis - SCDs aspirin 81 mg daily and Xarelto (rivaroxaban) daily prior to admission will restart ,Xarelto (rivaroxaban) daily.  Additional aspirin not necessary from a stroke standpoint Therapy recommendations: none Disposition:  Home     ROS:   14 system review of systems performed and negative with exception of those listed in HPI  PMH:  Past Medical History:  Diagnosis Date   Cancer (HCC)    skin   DJD (degenerative joint disease), lumbar    DVT (deep venous thrombosis) (HCC)    Dysrhythmia    Family history of adverse reaction to anesthesia    Glaucoma    R eye diminished vision approx 50%   High cholesterol    Hyperlipidemia    Hypertension    IFG (impaired fasting glucose)    OSA (obstructive sleep apnea)    Severe w AHI 34/hr on HST no on BiPAP at 19/15cm H2O   PE (pulmonary embolism)    Second occurance,enknown etiology,lifelong anticoagulation    PSH:  Past Surgical History:  Procedure Laterality Date   CARDIAC CATHETERIZATION N/A 05/14/2016   Procedure: Left Heart Cath and Coronary Angiography;  Surgeon: Lyn Records, MD;  Location: [facility] INVASIVE CV LAB;  Service: Cardiovascular;  Laterality: N/A;   CORONARY ARTERY BYPASS GRAFT N/A 05/14/2016   Procedure: CORONARY ARTERY BYPASS GRAFTING (CABG) times two with LIMA to LAD and right leg SVG to PDA,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;  Surgeon: Kerin Perna, MD;  Location: [facility] OR;  Service: Open Heart Surgery;  Laterality: N/A;   EXCISIONAL TOTAL KNEE ARTHROPLASTY WITH ANTIBIOTIC SPACERS Right 05/26/2021   Procedure: EXCISIONAL TOTAL KNEE ARTHROPLASTY WITH ANTIBIOTIC SPACERS;  Surgeon: Samson Frederic, MD;  Location: [facility] ORS;  Service: Orthopedics;  Laterality: Right;   KNEE SURGERY Bilateral    arthroscopic   TOTAL KNEE ARTHROPLASTY Bilateral 1996   TOTAL KNEE REVISION Right 09/22/2021   Procedure: RIGHT KNEE REMOVAL SPACER TOTAL KNEE REVISION;  Surgeon: Samson Frederic, MD;  Location: [facility] ORS;  Service: Orthopedics;  Laterality: Right;  210    Social History:  Social History   Socioeconomic History   Marital status: Married    Spouse name: Not on file   Number of children: Not on file   Years of education: Not on file   Highest education level: Not on file  Occupational History   Not on file  Tobacco Use   Smoking status: Never   Smokeless tobacco: Never  Vaping Use   Vaping status: Never Used  Substance and Sexual Activity   Alcohol use: Yes    Comment: 1-2 per week   Drug use: Never   Sexual activity: Not on file  Other Topics Concern   Not on file  Social History Narrative   Not on file   Social Determinants of Health   Financial Resource Strain: Not on file  Food Insecurity: Not on file  Transportation Needs:  Not on file  Physical Activity: Not on file  Stress: Not on file  Social Connections: Not on file  Intimate Partner Violence: Not on file    Family History:  Family History  Problem Relation Age of Onset   Osteoporosis Mother    Hypertension Father    CAD Father     Medications:   Current Outpatient Medications on File Prior to Visit  Medication Sig Dispense Refill   acetaminophen (TYLENOL) 650 MG CR tablet Take 650 mg by mouth every 8 (eight) hours as needed for pain.     allopurinol (ZYLOPRIM) 300 MG tablet Take 300 mg by mouth daily.      amoxicillin (AMOXIL) 500 MG tablet Take 2,000 mg by mouth See admin instructions. 1 hour prior to dental appointment     carvedilol (COREG) 3.125 MG tablet Take 1 tablet (3.125 mg total) by mouth 2 (two) times daily. 180 tablet 3   Cetirizine HCl (ZYRTEC ALLERGY) 10 MG CAPS as needed (allergies).     dapagliflozin propanediol (FARXIGA) 10 MG TABS tablet Take 1 tablet (10 mg total) by mouth daily before breakfast. 90 tablet 2   diclofenac Sodium (VOLTAREN) 1 % GEL as needed (pain).     docusate sodium (COLACE) 100 MG capsule Take 1 capsule (100 mg total) by mouth 2 (two)  times daily. (Patient taking differently: Take 100 mg by mouth as needed for mild constipation.) 60 capsule 2   dorzolamide-timolol (COSOPT) 2-0.5 % ophthalmic solution 2 (two) times daily.     folic acid (FOLVITE) 1 MG tablet Take by mouth.     gabapentin (NEURONTIN) 100 MG capsule Take 1 capsule by mouth daily.     levETIRAcetam (KEPPRA XR) 500 MG 24 hr tablet Take 2 tablets (1,000 mg total) by mouth daily. 60 tablet 11   losartan (COZAAR) 25 MG tablet Take 25 mg by mouth daily.     methotrexate (RHEUMATREX) 2.5 MG tablet Take 2.5 mg by mouth once a week.     Multiple Vitamins-Minerals (PRESERVISION AREDS 2) CAPS 2 (two) times daily.     rivaroxaban (XARELTO) 20 MG TABS tablet Take 1 tablet (20 mg total) by mouth daily with supper. 30 tablet 2   sertraline (ZOLOFT) 100 MG tablet Take 150 mg by mouth daily.     simvastatin (ZOCOR) 40 MG tablet Take 1 tablet (40 mg total) by mouth at bedtime. 90 tablet 3   spironolactone (ALDACTONE) 25 MG tablet Take 0.5 tablets (12.5 mg total) by mouth daily. 45 tablet 3   traMADol (ULTRAM) 50 MG tablet Take 50 mg by mouth as needed for moderate pain or severe pain.     No current facility-administered medications on file prior to visit.    Allergies:   Allergies  Allergen Reactions   Oxycodone Nausea Only       OBJECTIVE:  Physical Exam  Vitals:   05/16/23 1036  BP: 126/74  Pulse: (!) 58  Weight: 219 lb (99.3 kg)  Height: 5\' 10"  (1.778 m)   Body mass index is 31.42 kg/m. No results found.  General: well developed, well nourished, pleasant elderly Caucasian male, seated, in no evident distress Head: head normocephalic and atraumatic.   Neck: supple with no carotid or supraclavicular bruits Cardiovascular: regular rate and rhythm, no murmurs Musculoskeletal: no deformity Skin:  no rash/petichiae Vascular:  Normal pulses all extremities   Neurologic Exam Mental Status: Awake and fully alert.  Fluent speech and language.  Oriented to  place and time. Recent memory  subjectively mildly impaired and remote memory intact. Attention span, concentration and fund of knowledge appropriate. Mood and affect appropriate.  Cranial Nerves: Pupils equal, briskly reactive to light. Extraocular movements full without nystagmus. Visual fields full to confrontation. Hearing intact. Facial sensation intact. Very slight left facial asymmetry. tongue, palate moves normally and symmetrically.  Motor: Normal bulk and tone. Normal strength in all tested extremity muscles  Sensory.: intact to touch , pinprick , position and vibratory sensation.  Coordination: Rapid alternating movements normal in all extremities slightly decreased left hand. Finger-to-nose and heel-to-shin performed accurately bilaterally.  Gait and Station: Arises from chair without difficulty. Stance is slightly hunched. Gait demonstrates slow cautious steps with decreased stride length and step height without use of assistive device.  Tandem walk and heel toe not attempted due to safety concerns Reflexes: 1+ and symmetric. Toes downgoing.          ASSESSMENT: Jabier Culhane. is a 72 y.o. year old male with posterior circulation TIA vs unwitnessed seizure with postictal state on 01/22/2021 after presenting with altered mental status and dysarthria.  Witnessed generalized tonic-clonic seizure 12/29/2021 possibly symptomatic from old left frontal stroke as seen on [facility].  Vascular risk factors include HLD, hx of PE and DVT on Xarelto, multiple prior strokes on imaging, HTN, OSA not on BiPAP, CHF and CAD s/p CABG.      PLAN:  Seizure likely from prior stroke:  Event in Nov and Dec 2023 which consisted of disorientation and speech arrest for 1-2 minutes possibly in setting of focal seizure. Discussed increased keppra dosage but is hesitant to do so at this time. Advised to call with any additional events and may need to consider increase at that time Recommend starting Briviact 50mg   twice daily in place of Keppra due to mood changes. Possible underlying mood disorder also contributing.  Advised if symptoms persist after switching, to follow-up with PCP for further evaluation. EEG 12/29/2021 diffuse encephalopathy, no seizures or epileptiform discharges  MRI brain 12/2021 no acute findings, high cortical/subcortical infarct within the mid right frontal lobe which could reflect a seizure focus as well as chronic small vessel Shikhman changes stable since prior imaging, redemonstrated chronic infarcts in the right BG and left cerebral hemisphere and mild generalized cerebral atrophy MR brain 01/2021 chronic right frontal infarct, R BG lacunes, left cerebellar infarct and remote microhemorrhages Discussed avoidance of seizure provoking triggers/activity Advised to call office with any recurrent seizure activity  Hx of multiple strokes (per imaging, ?silent strokes) Continue current treatment regimen with Xarelto (hx of PE/DVT) and simvastatin managed by PCP Continue close PCP follow-up for aggressive stroke risk factor management including HTN with BP goal<130/90 and HLD with LDL goal<70  Rheumatoid arthritis C/o bilateral hand pain -recently started on methotrexate by rheumatology C/o bilateral hand fingertip numbness, no sensory changes on exam Advised to continue to follow with rheumatology. If [facility] hand numbness/tingling, can [facility] or have them send a referral to this office for evaluation     Follow up in 6 months or call earlier if needed    CC:  PCP: Lupita Raider, MD     I spent 36 minutes of face-to-face and non-face-to-face time with patient and wife.  This included previsit chart review, lab review, study review, electronic health record documentation, patient and wife education and discussion regarding above diagnoses and treatment plan and answered all other questions to patient satisfaction  Ihor Austin, AGNP-BC  [facility] [address] [place], [place] [address]  Phone [phone] Fax [phone] Note: This document was prepared with digital dictation and possible smart phrase technology. Any transcriptional errors that result from this process are unintentional.

## 2023-05-17 ENCOUNTER — Telehealth: Payer: Self-pay | Admitting: Adult Health

## 2023-05-17 NOTE — Telephone Encounter (Signed)
Call to wife, she verbalized understanding of reason for medication change but stated she couldn't pick up due to insurance. Advised I would call pharmacy and follow up on what was needed.   3:48pm call to Yaw at [facility]. He states PA was faxed and I asked to refax to [phone].  Faxed PA to prior auth team, [employer].   Wife updated.

## 2023-05-17 NOTE — Telephone Encounter (Signed)
Pt's wife was informed by the pharmacy physician need to call insurance company why medication needed to be changed to  Brivaracetam (BRIVIACT) 50 MG TABS  . Would like a call from the nurse to know when can pick up medication

## 2023-05-17 NOTE — Telephone Encounter (Signed)
Noted! Thank you

## 2023-05-24 ENCOUNTER — Other Ambulatory Visit (HOSPITAL_COMMUNITY): Payer: Self-pay

## 2023-05-24 ENCOUNTER — Telehealth: Payer: Self-pay

## 2023-05-24 NOTE — Telephone Encounter (Signed)
Pharmacy Patient Advocate Encounter   Received notification from CoverMyMeds that prior authorization for Briviact 50MG  tablets is required/requested.   Insurance verification completed.   The patient is insured through [facility] .   Per test claim: PA required; PA submitted to [facility] via CoverMyMeds Key/confirmation #/[id number] [facility] Status is pending

## 2023-05-28 NOTE — Telephone Encounter (Signed)
Unsure why "unspecified convulsions; seizure" are listed for diagnostic codes. Should be "complex partial seizures" and "seizures, late effect of stroke". Can this please be updated and see if they approve med under those diagnostic codes?

## 2023-05-28 NOTE — Telephone Encounter (Signed)
   Tried to resubmit but unable due to denial.

## 2023-05-28 NOTE — Telephone Encounter (Signed)
Pharmacy Patient Advocate Encounter  Received notification from [facility] that Prior Authorization for Briviact 50MG  tablets has been DENIED. Please advise how you'd like to proceed. Full denial letter will be uploaded to the media tab. See denial reason below.     PA #/Case ID/Reference #: PA Case ID #: [id number]

## 2023-06-08 NOTE — Telephone Encounter (Addendum)
Pt's wife, Keyjuan Tiano (on [place]) pharmacy informed her physician needs to fill out a form and send to insurance company. Would like from the nurse. Please me because he does not always carry his phone. Can contact at [phone]

## 2023-06-13 ENCOUNTER — Encounter: Payer: Self-pay | Admitting: Neurology

## 2023-06-13 NOTE — Telephone Encounter (Signed)
The PA was completed by our PA team and it was denied. We are having to complete an appeal for the medication. Appeal letter is written and sent in as expedited request to [employer] appeals.  Received confirmation that it went through

## 2023-06-20 DIAGNOSIS — M47816 Spondylosis without myelopathy or radiculopathy, lumbar region: Secondary | ICD-10-CM | POA: Diagnosis not present

## 2023-07-05 NOTE — Telephone Encounter (Signed)
Called the [employer] rx appeal team and was advised that the medication determination was overturned.  The med is approved now but unable to tell me anymore details without transferring me to a different team.

## 2023-07-05 NOTE — Telephone Encounter (Signed)
Connected to [place] and medicare part d [id number] approved 05/24/23-10/16/2023 with current insurance.

## 2023-07-30 DIAGNOSIS — M47816 Spondylosis without myelopathy or radiculopathy, lumbar region: Secondary | ICD-10-CM | POA: Diagnosis not present

## 2023-09-04 DIAGNOSIS — Z96659 Presence of unspecified artificial knee joint: Secondary | ICD-10-CM | POA: Diagnosis not present

## 2023-09-04 DIAGNOSIS — I509 Heart failure, unspecified: Secondary | ICD-10-CM | POA: Diagnosis not present

## 2023-09-04 DIAGNOSIS — I25119 Atherosclerotic heart disease of native coronary artery with unspecified angina pectoris: Secondary | ICD-10-CM | POA: Diagnosis not present

## 2023-09-04 DIAGNOSIS — M064 Inflammatory polyarthropathy: Secondary | ICD-10-CM | POA: Diagnosis not present

## 2023-09-04 DIAGNOSIS — Z0000 Encounter for general adult medical examination without abnormal findings: Secondary | ICD-10-CM | POA: Diagnosis not present

## 2023-09-04 DIAGNOSIS — R7303 Prediabetes: Secondary | ICD-10-CM | POA: Diagnosis not present

## 2023-09-04 DIAGNOSIS — E7800 Pure hypercholesterolemia, unspecified: Secondary | ICD-10-CM | POA: Diagnosis not present

## 2023-09-04 DIAGNOSIS — I2782 Chronic pulmonary embolism: Secondary | ICD-10-CM | POA: Diagnosis not present

## 2023-09-04 DIAGNOSIS — I700 Atherosclerosis of aorta: Secondary | ICD-10-CM | POA: Diagnosis not present

## 2023-09-04 DIAGNOSIS — R569 Unspecified convulsions: Secondary | ICD-10-CM | POA: Diagnosis not present

## 2023-09-04 DIAGNOSIS — I69398 Other sequelae of cerebral infarction: Secondary | ICD-10-CM | POA: Diagnosis not present

## 2023-09-04 DIAGNOSIS — D6869 Other thrombophilia: Secondary | ICD-10-CM | POA: Diagnosis not present

## 2023-09-04 LAB — LAB REPORT - SCANNED
A1c: 6.2
Albumin, Urine POC: 0.7
Albumin/Creatinine Ratio, Urine, POC: 8.4
Creatinine, POC: 83 mg/dL
EGFR: 91

## 2023-10-11 ENCOUNTER — Other Ambulatory Visit: Payer: Self-pay | Admitting: Adult Health

## 2023-10-22 DIAGNOSIS — M47816 Spondylosis without myelopathy or radiculopathy, lumbar region: Secondary | ICD-10-CM | POA: Diagnosis not present

## 2023-11-07 DIAGNOSIS — I509 Heart failure, unspecified: Secondary | ICD-10-CM | POA: Diagnosis not present

## 2023-11-07 DIAGNOSIS — R569 Unspecified convulsions: Secondary | ICD-10-CM | POA: Diagnosis not present

## 2023-11-07 DIAGNOSIS — R55 Syncope and collapse: Secondary | ICD-10-CM | POA: Diagnosis not present

## 2023-11-07 DIAGNOSIS — I25119 Atherosclerotic heart disease of native coronary artery with unspecified angina pectoris: Secondary | ICD-10-CM | POA: Diagnosis not present

## 2023-11-22 ENCOUNTER — Other Ambulatory Visit: Payer: Self-pay | Admitting: Adult Health

## 2023-11-26 NOTE — Telephone Encounter (Signed)
 Last seen on 05/16/23 per note " Recommend starting Briviact  50mg  twice daily in place of Keppra  due to mood changes "   Follow up scheduled on 12/03/23

## 2023-11-28 DIAGNOSIS — R55 Syncope and collapse: Secondary | ICD-10-CM | POA: Diagnosis not present

## 2023-12-03 ENCOUNTER — Encounter: Payer: Self-pay | Admitting: Adult Health

## 2023-12-03 ENCOUNTER — Ambulatory Visit: Payer: Medicare Other | Admitting: Adult Health

## 2023-12-03 ENCOUNTER — Other Ambulatory Visit: Payer: Self-pay | Admitting: Adult Health

## 2023-12-03 VITALS — BP 119/66 | HR 57 | Ht 70.0 in | Wt 219.0 lb

## 2023-12-03 DIAGNOSIS — Z8673 Personal history of transient ischemic attack (TIA), and cerebral infarction without residual deficits: Secondary | ICD-10-CM

## 2023-12-03 DIAGNOSIS — R569 Unspecified convulsions: Secondary | ICD-10-CM

## 2023-12-03 DIAGNOSIS — I69398 Other sequelae of cerebral infarction: Secondary | ICD-10-CM | POA: Diagnosis not present

## 2023-12-03 DIAGNOSIS — R55 Syncope and collapse: Secondary | ICD-10-CM | POA: Diagnosis not present

## 2023-12-03 DIAGNOSIS — R42 Dizziness and giddiness: Secondary | ICD-10-CM | POA: Diagnosis not present

## 2023-12-03 NOTE — Patient Instructions (Addendum)
 Your Plan:   Please call pharmacy and try to get Brivact refilled - if they are saying a new order is needed, please let me know  Please monitor facial symptoms, please keep a log of when these events happen, if this persists, would recommend we make further adjustment to antiseizure medication  If medication is too expensive, please let me know and we can discuss alternative treatment options   Please follow up with cardiology next week to discuss dizziness episodes - if all work up normal from cardiology, please let me know and we may have to repeat an EEG to further evaluate for seizures     Follow up in 6 months or call earlier if needed      Thank you for coming to see [place] at [facility]. I hope we have been able to provide you high quality care today.  You may receive a patient satisfaction survey over the next few weeks. We would appreciate your feedback and comments so that we may continue to improve ourselves and the health of our patients.

## 2023-12-03 NOTE — Telephone Encounter (Signed)
 Last seen on 12/03/23  I called [facility] and Rx has expired, per [facility] Rx was never picked up.   Rx pending to be signed

## 2023-12-03 NOTE — Progress Notes (Signed)
 [facility] [address] [place]. [place] [address] [phone]       OFFICE FOLLOW UP NOTE  [name]. Date of Birth:  1951/07/16 Medical Record Number:  [id number]   Reason for visit: Seizure and stroke follow up    SUBJECTIVE:   CHIEF COMPLAINT:  Chief Complaint  Patient presents with   Seizures    Rm 3 with spouse  Pt is well, spouse reports he will zone out and become unresponsive for a few minutes since last visit. No new stroke concerns     HPI:   Update 12/03/2023 JM: Patient returns for 61-month follow-up accompanied by his wife who provides majority of history.  Wife reports episodes of presyncope over the past 3 months, only 1 actual syncopal event, typically will feel a sensation of lightheadedness, and sometimes tunnel vision, is able to grab on to an object or sit down and symptoms resolve in less than 1 minute. Is aware throughout and remembers episodes. Can occur about once per week. Usually occurs when going from sitting to standing or while walking. No confusion or fatigue after or any seizure-like activity. Was recently discussed with PCP who completed lab work and cardiac monitor, lab work satisfactory, cardiac monitor normal per wife (unable to view via epic), has f/u next week with cardiology.  Wife also mentions occasional episodes of abnormal mouth movements, does not respond to wife, only lasts for a few seconds, patient does not remember this occurring. No postictal type symptoms.  At prior visit, he was switched from Keppra XR to Briviact due to continued mood concerns.  Insurance initially denied Briviact but was approved upon appeal but was never picked up from pharmacy. He ran out of Keppra XR prescription about 3 weeks ago, wife found old prescription of Keppra IR 500mg  tablets and he started taking that again mid last week after wife felt he was not acting right one day ("just seemed off")but no actual seizure activity.  Wife did note some possible improvement of mood while off keppra, they are still interested in pursuing Briviact.  Otherwise, stable from stroke standpoint without new stroke/TIA symptoms.  He does report poor short-term memory which is not new.  Remains on Xarelto and simvastatin.  Routinely follows with PCP for stroke risk factor management.      History provided for reference purposes only Update 05/16/2023 JM: Patient returns for follow-up visit accompanied by his wife.  Denies any recurrent seizure activity.  Was switched from Keppra IR 500mg  BID to Keppra XR 1000mg  daily at prior visit due to changes in mood but no significant improvement. Wife does note since changing, he has been taking longer to get going in the morning.   Denies any new stroke/TIA symptoms.  Compliant on medications.  Routinely follows with PCP Dr. Clelia Croft as well [facility]   Reports continued hand pain, right greater than left, routinely followed with rheumatology for rheumatoid arthritis.  Started on methotrexate 6 weeks ago and has follow-up next month.  He does note bilateral fingertip numbness/tingling, at times can have difficulty holding objects. Per wife, [place] spoke with him about possibly needing neuropathy workup.  Update 11/15/2022 JM: Patient returns for 77-month stroke and seizure follow-up accompanied by his wife.  Reports episode back in December after they recently moved and unpacking, wife asked him a question and he responded with "I don't know, I don't understand" and then was unable to speak for 1-2 minutes after and then  returned back to baseline. No other symptoms. Did not lose consciousness, no postictal symptoms. Similar event occurred in November while in [employer]. No additional events since then. Reports compliance on Keppra 500 mg twice daily. Patient reports increased stressors as they care for their grandchildren and is constantly worrying about them. Wife mentions that he  has been more irritable, questions if this is from medications.    At prior visit, complained of coughing after swallowing foods and liquids, order placed for MBS but did not pursue as symptoms resolved, has not had any issues with this since then.   Denies any new stroke/TIA symptoms. Compliant on Xarelto and simvastatin.  Blood pressure well-controlled.  [facility]   Update 05/09/2022 JM: Patient returns for 37-month seizure and stroke follow-up accompanied by his wife.   Has been stable since prior visit without any additional seizure activity.  Remains on Keppra 500 mg twice daily, denies side effects.  Completed MRI which did not show any new or acute findings but did note redemonstrated small chronic cortical/subcortical infarct within the mid right frontal lobe which could reflect a seizure focus.  He does note over the past couple of months coughing after swallowing food and liquids. At times, can cough so bad he will vomit. Can occur with any type of food and drink.  Does not have difficulty initiating swallowing.  Denies any issues with swallowing in the past or hx of esophageal issues.  Denies any issues with acid reflux or history of acid reflux.  Denies any new or reoccurring stroke/TIA symptoms Compliant on Xarelto and simvastatin, denies side effects Blood pressure today 112/58  Reports continued left hand pain and at times right hand. He is being seen by a rheumatologist through [facility] for questionable rheumatoid arthritis who recently started him on colchicine for possible acute gout flare with some improvement of pain, has f/u in a couple weeks. He also notes chronic back pain and is scheduled to see Dr. Danielle Dess this week.  Completed sleep study 02/2022 with Dr. Mayford Knife which showed mild OSA with total AHI 13/h and recommended therapeutic CPAP titration.    No further concerns at this time  Update 01/02/2022 JM: Patient returns due to recent seizure activity and hospital  follow-up.  He is accompanied by his wife.  He has not been seen in over 10 months although requested 6 mo follow up after he was seen for TIA versus seizure activity.  He presented to ED on 3/16 with seizure-like activity and recurrent tonic-clonic seizure in ED receiving IV Ativan and IV Keppra load. Evaluated by neurologist Dr. Amada Jupiter.  He was started on Keppra 500 mg twice daily. EEG mild diffuse encephalopathy but no seizures or epileptiform discharges.  Planned on completing MR brain outpatient and discharged home on 3/18.  Of note, back in 06/2021, wife called office reporting episode of his hands starting to jerk, head fell back and unable to speak for 1-2 minutes. He did not follow up as recommended.   He has been stable since discharge, no additional seizure activity. Does report some generalized fatigue since discharge but did have some fatigue prior due to chronic low back pain. Compliant on Keppra 500 mg twice daily, denies side effects. He has not yet completed MRI - wife was under the impression this would be done today at appointment. Wife asks multiple appropriate questions regarding recent events and prior imaging.   Initial visit 02/22/2021 JM: Mr. Burdin is being seen for hospital follow-up accompanied  by his wife, Erskine Squibb  Doing well from stroke standpoint without new or reoccurring symptoms.  He does report occasional short-term memory difficulty or delayed recall but this has been on going C/o intermittent dizziness with one episode of passing out -cardiology aware who felt likely due to slight orthostasis with recent medication adjustments Also reports right leg calf and thigh pain as well as swelling that has been improving. Per wife, cardiology evaluated yesterday and no emergent concerns reported - defer ongoing monitoring to cardiology  Compliant on Xarelto and simvastatin without associated side effects Blood pressure today 114/67 - occasionally monitors at home   No further  concerns at this time  Hospitalization 01/22/2021 Mr. LUI BELLIS is a 73 y.o. male with history of of DJD of lumbar spine, frequent lower back pain, history of bilateral PE, history of DVT, glaucoma, hyperlipidemia, hypertension, impaired fasting glucose, OSA no longer on BiPAP, CAD/CABG (per patient required defibrillation during CABG) who presented  on 01/22/2021 with AMS and recent episode with dysarthria. Patient symptoms resolved by the time he presented to the ED and his AMS improved on his initial exam.  Personally reviewed hospitalization pertinent progress notes, lab work and imaging with summary provided.  CT head and MRI negative for acute stroke and symptoms likely in setting of TIA vs possible unwitnessed seizure with postictal confusion as symptoms lasted for approximately 2-2.5hrs and pt unable to recall episode.  EEG negative.  Recommended continuation of Xarelto for secondary stroke prevention with history of DVT and PE.  2D echo mild worsening of HFrEF from 40 to 45% in 2017 to now 30 to 35% -recommended outpatient follow-up with established cardiologist.  LDL 55.6 on simvastatin.  Evaluated by therapy and discharged home in stable condition without therapy needs.  Posterior circulation TIA vs unwitnessed seizure with post ictal presentation Code Stroke : CT head No acute abnormality. Small vessel disease. ASPECTS 10.  CTA head & neck: No high grade stenosis of the intracranial arteries and no Hemodynamically significant stenosis of bilateral carotids CT perfusion: negative MRI : No acute findings, chronic small vessel dx and small remote frontal lobe infarct 2D Echo: LVEF 30-35%, LV regional wall abnormalities (septal and inferior wall hypokinesis), Mild AVR, L atrium mildy enlarged, Mild MVR, EEG no evidence of seizure activity LDL 55.6 HgbA1c 5.6 VTE prophylaxis - SCDs aspirin 81 mg daily and Xarelto (rivaroxaban) daily prior to admission will restart ,Xarelto (rivaroxaban)  daily.  Additional aspirin not necessary from a stroke standpoint Therapy recommendations: none Disposition:  Home     ROS:   14 system review of systems performed and negative with exception of those listed in HPI  PMH:  Past Medical History:  Diagnosis Date   Cancer (HCC)    skin   DJD (degenerative joint disease), lumbar    DVT (deep venous thrombosis) (HCC)    Dysrhythmia    Family history of adverse reaction to anesthesia    Glaucoma    R eye diminished vision approx 50%   High cholesterol    Hyperlipidemia    Hypertension    IFG (impaired fasting glucose)    OSA (obstructive sleep apnea)    Severe w AHI 34/hr on HST no on BiPAP at 19/15cm H2O   PE (pulmonary embolism)    Second occurance,enknown etiology,lifelong anticoagulation    PSH:  Past Surgical History:  Procedure Laterality Date   CARDIAC CATHETERIZATION N/A 05/14/2016   Procedure: Left Heart Cath and Coronary Angiography;  Surgeon: Lyn Records,  MD;  Location: [facility] INVASIVE CV LAB;  Service: Cardiovascular;  Laterality: N/A;   CORONARY ARTERY BYPASS GRAFT N/A 05/14/2016   Procedure: CORONARY ARTERY BYPASS GRAFTING (CABG) times two with LIMA to LAD and right leg SVG to PDA,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;  Surgeon: Kerin Perna, MD;  Location: [facility] OR;  Service: Open Heart Surgery;  Laterality: N/A;   EXCISIONAL TOTAL KNEE ARTHROPLASTY WITH ANTIBIOTIC SPACERS Right 05/26/2021   Procedure: EXCISIONAL TOTAL KNEE ARTHROPLASTY WITH ANTIBIOTIC SPACERS;  Surgeon: Samson Frederic, MD;  Location: [facility] ORS;  Service: Orthopedics;  Laterality: Right;   KNEE SURGERY Bilateral    arthroscopic   TOTAL KNEE ARTHROPLASTY Bilateral 1996   TOTAL KNEE REVISION Right 09/22/2021   Procedure: RIGHT KNEE REMOVAL SPACER TOTAL KNEE REVISION;  Surgeon: Samson Frederic, MD;  Location: [facility] ORS;  Service: Orthopedics;  Laterality: Right;  210    Social History:  Social History   Socioeconomic History   Marital status:  Married    Spouse name: Not on file   Number of children: Not on file   Years of education: Not on file   Highest education level: Not on file  Occupational History   Not on file  Tobacco Use   Smoking status: Never   Smokeless tobacco: Never  Vaping Use   Vaping status: Never Used  Substance and Sexual Activity   Alcohol use: Yes    Comment: 1-2 per week   Drug use: Never   Sexual activity: Not on file  Other Topics Concern   Not on file  Social History Narrative   Not on file   Social Drivers of Health   Financial Resource Strain: Not on file  Food Insecurity: Not on file  Transportation Needs: Not on file  Physical Activity: Not on file  Stress: Not on file  Social Connections: Not on file  Intimate Partner Violence: Not on file    Family History:  Family History  Problem Relation Age of Onset   Osteoporosis Mother    Hypertension Father    CAD Father     Medications:   Current Outpatient Medications on File Prior to Visit  Medication Sig Dispense Refill   acetaminophen (TYLENOL) 650 MG CR tablet Take 650 mg by mouth every 8 (eight) hours as needed for pain.     allopurinol (ZYLOPRIM) 300 MG tablet Take 300 mg by mouth daily.      Brivaracetam (BRIVIACT) 50 MG TABS Take 50 mg by mouth 2 (two) times daily. 60 tablet 5   carvedilol (COREG) 3.125 MG tablet Take 1 tablet (3.125 mg total) by mouth 2 (two) times daily. 180 tablet 3   Cetirizine HCl (ZYRTEC ALLERGY) 10 MG CAPS as needed (allergies).     dapagliflozin propanediol (FARXIGA) 10 MG TABS tablet Take 1 tablet (10 mg total) by mouth daily before breakfast. 90 tablet 2   diclofenac Sodium (VOLTAREN) 1 % GEL as needed (pain).     dorzolamide-timolol (COSOPT) 2-0.5 % ophthalmic solution 2 (two) times daily.     folic acid (FOLVITE) 1 MG tablet Take by mouth.     gabapentin (NEURONTIN) 100 MG capsule Take 1 capsule by mouth daily.     losartan (COZAAR) 25 MG tablet Take 25 mg by mouth daily.     methotrexate  (RHEUMATREX) 2.5 MG tablet Take 2.5 mg by mouth once a week.     Multiple Vitamins-Minerals (PRESERVISION AREDS 2) CAPS 2 (two) times daily.     rivaroxaban (XARELTO) 20  MG TABS tablet Take 1 tablet (20 mg total) by mouth daily with supper. 30 tablet 2   sertraline (ZOLOFT) 100 MG tablet Take 150 mg by mouth daily.     simvastatin (ZOCOR) 40 MG tablet Take 1 tablet (40 mg total) by mouth at bedtime. 90 tablet 3   spironolactone (ALDACTONE) 25 MG tablet Take 0.5 tablets (12.5 mg total) by mouth daily. 45 tablet 3   traMADol (ULTRAM) 50 MG tablet Take 50 mg by mouth as needed for moderate pain or severe pain.     No current facility-administered medications on file prior to visit.    Allergies:   Allergies  Allergen Reactions   Oxycodone Nausea Only       OBJECTIVE:  Physical Exam  Vitals:   12/03/23 1048  BP: 119/66  Pulse: (!) 57  Weight: 219 lb (99.3 kg)  Height: 5\' 10"  (1.778 m)    Body mass index is 31.42 kg/m. No results found.  General: well developed, well nourished, pleasant elderly Caucasian male, seated, in no evident distress  Neurologic Exam Mental Status: Awake and fully alert.  Fluent speech and language.  Oriented to place and time. Recent memory subjectively mildly impaired and remote memory intact. Attention span, concentration and fund of knowledge appropriate although wife tends to provide history. Mood and affect appropriate.  Cranial Nerves: Pupils equal, briskly reactive to light. Extraocular movements full without nystagmus. Visual fields full to confrontation. Hearing intact. Facial sensation intact. Very slight left facial asymmetry. tongue, palate moves normally and symmetrically.  Motor: Normal bulk and tone. Normal strength in all tested extremity muscles  Sensory.: intact to touch , pinprick , position and vibratory sensation.  Coordination: Rapid alternating movements normal in all extremities slightly decreased left hand. Finger-to-nose and  heel-to-shin performed accurately bilaterally.  Gait and Station: Arises from chair without difficulty. Stance is slightly hunched. Gait demonstrates slow cautious steps with decreased stride length and step height without use of assistive device.  Tandem walk and heel toe not attempted due to safety concerns Reflexes: 1+ and symmetric. Toes downgoing.          ASSESSMENT: Patrick Fritz. is a 73 y.o. year old male with posterior circulation TIA vs unwitnessed seizure with postictal state on 01/22/2021 after presenting with altered mental status and dysarthria.  Witnessed generalized tonic-clonic seizure 12/29/2021 possibly symptomatic from old left frontal stroke as seen on [facility].  Vascular risk factors include HLD, hx of PE and DVT on Xarelto, multiple prior strokes on imaging, HTN, OSA not on BiPAP, CHF and CAD s/p CABG. Today, mentions episodes of presyncope over the past 3 months.      PLAN:  Seizure likely from prior stroke:  Intermittent oral automatisms with abnormal jaw movements and verbally unresponsive - possibly focal seizure (see below for medication rec's) Prior event in Nov and Dec 2023 which consisted of disorientation and speech arrest for 1-2 minutes possibly in setting of focal seizure but patient/family declined adjustment in keprpa dosage. Previously, recommended switching Keppra to Briviact due to mood changes but has not yet done so. Recently ran out of keppra XR rx, wife restarted old rx of keppra IR. Wife was encouraged to contact pharmacy as he should still have an active prescription for Briviact, advised wife to call if any difficulties in obtaining. He will continue keppra until able to start Briviact. If oral automatisms persist, consider dosage adjustment and/or repeat EEG. If any difficulties getting Briviact filled due to cost, consider switch to  lamotrigine for further seizure prevention and may also benefit mood. EEG 12/29/2021 diffuse encephalopathy, no seizures  or epileptiform discharges  MRI brain 12/2021 no acute findings, high cortical/subcortical infarct within the mid right frontal lobe which could reflect a seizure focus as well as chronic small vessel Shikhman changes stable since prior imaging, redemonstrated chronic infarcts in the right BG and left cerebral hemisphere and mild generalized cerebral atrophy MR brain 01/2021 chronic right frontal infarct, R BG lacunes, left cerebellar infarct and remote microhemorrhages Discussed avoidance of seizure provoking triggers/activity Advised to call office with any recurrent seizure activity  Presyncope: Associated with episodes of lightheadedness and tunnel vision, usually with position change or walking, lasts only several seconds, no postictal symptoms or seizure-like activity Suspect more cardiac related, has f/u with cardiology next week  If work up from cardiac perspective unremarkable, consider obtaining EEG to rule out seizures and/or MRI brain to rule out structural abnormalities  Hx of multiple strokes (per imaging, ?silent strokes) Continue current treatment regimen with Xarelto (hx of PE/DVT) and simvastatin managed by PCP Continue close PCP follow-up for aggressive stroke risk factor management including HTN with BP goal<130/90 and HLD with LDL goal<70     Follow up in 6 months or call earlier if needed    CC:  PCP: Lupita Raider, MD    I spent 45 minutes of face-to-face and non-face-to-face time with patient and wife.  This included previsit chart review, lab review, study review, electronic health record documentation, patient and wife education and discussion regarding above diagnoses and treatment plan and answered all other questions to patient satisfaction  Ihor Austin, [facility]  [facility] [address] [place], [place] [address]  Phone [phone] Fax [phone] Note: This document was prepared with digital dictation and possible  smart phrase technology. Any transcriptional errors that result from this process are unintentional.

## 2023-12-10 ENCOUNTER — Telehealth: Payer: Self-pay | Admitting: Cardiology

## 2023-12-10 ENCOUNTER — Ambulatory Visit: Payer: Medicare Other | Attending: Cardiology | Admitting: Cardiology

## 2023-12-10 ENCOUNTER — Encounter: Payer: Self-pay | Admitting: Cardiology

## 2023-12-10 VITALS — BP 122/72 | HR 64 | Ht 70.0 in | Wt 218.4 lb

## 2023-12-10 DIAGNOSIS — Z951 Presence of aortocoronary bypass graft: Secondary | ICD-10-CM | POA: Diagnosis not present

## 2023-12-10 DIAGNOSIS — I5022 Chronic systolic (congestive) heart failure: Secondary | ICD-10-CM | POA: Diagnosis not present

## 2023-12-10 DIAGNOSIS — Z86718 Personal history of other venous thrombosis and embolism: Secondary | ICD-10-CM

## 2023-12-10 DIAGNOSIS — Z7901 Long term (current) use of anticoagulants: Secondary | ICD-10-CM | POA: Diagnosis not present

## 2023-12-10 DIAGNOSIS — Z95828 Presence of other vascular implants and grafts: Secondary | ICD-10-CM

## 2023-12-10 NOTE — Progress Notes (Signed)
 Cardiology Office Note:  .   Date:  12/10/2023  ID:  Patrick Pester., DOB Nov 19, 1950, MRN [id number] PCP: Lupita Raider, MD  [facility] Providers Cardiologist:  Lesleigh Noe, MD (Inactive)    History of Present Illness: .   Patrick Muff. is a 73 y.o. male Discussed with the use of AI scribe  History of Present Illness   Patrick Bero. is a 73 year old male with coronary artery disease who presents for follow-up after emergency bypass surgery.  He underwent emergency bypass surgery following a right coronary artery dissection with extension into the ascending aorta, which required bypass grafting and replacement of the ascending aorta. At the time of surgery, he had ischemic cardiomyopathy with an ejection fraction of 35%, which has since improved to 50-55%.  He is on chronic anticoagulation therapy with Xarelto due to a history of deep vein thrombosis and pulmonary embolism. Regular lab work is conducted every six months to monitor hemoglobin and creatinine levels, which have been normal.  He continues to take simvastatin 40 mg without any side effects. His lab work, including hemoglobin and creatinine, is monitored every six months and has been normal.  He has a history of a mini stroke in 2021 and two separate seizures. These events are noted in his medical history but are not the primary focus of the current visit.  He does not smoke.     Been having near syncope episodes in fact has had an episode of syncope.  Sometimes occurs at a restaurant when trying to get up, can be postural sometimes can be random as well.  This has been increased in frequency recently.  Dr. Clelia Croft placed a Zio monitor which I reviewed and interpreted-no adverse arrhythmias detected he has no pauses no significant bradycardia.  He has not been on his Patrick Fritz for about 2 months.  He does wear a pain patch and takes gabapentin as well.    Studies Reviewed: Marland Kitchen   EKG  Interpretation Date/Time:  Monday December 10 2023 09:22:24 EST Ventricular Rate:  61 PR Interval:    QRS Duration:  112 QT Interval:  412 QTC Calculation: 414 R Axis:   -50  Text Interpretation: Sinus rhythm  with PAC's Left axis deviation Inferior infarct , age undetermined Possible Anterior infarct , age undetermined When compared with ECG of 29-Dec-2021 01:28, No significant change since last tracing Confirmed by Donato Schultz ([id number]) on 12/10/2023 9:27:13 AM    Results   DIAGNOSTIC Ejection fraction: 35%     Risk Assessment/Calculations:            Physical Exam:   VS:  BP 122/72   Pulse 64   Ht 5\' 10"  (1.778 m)   Wt 218 lb 6.4 oz (99.1 kg)   SpO2 94%   BMI 31.34 kg/m    Wt Readings from Last 3 Encounters:  12/10/23 218 lb 6.4 oz (99.1 kg)  12/03/23 219 lb (99.3 kg)  05/16/23 219 lb (99.3 kg)    GEN: Well nourished, well developed in no acute distress NECK: No JVD; No carotid bruits CARDIAC: RRR, no murmurs, no rubs, no gallops RESPIRATORY:  Clear to auscultation without rales, wheezing or rhonchi  ABDOMEN: Soft, non-tender, non-distended EXTREMITIES:  No edema; No deformity   ASSESSMENT AND PLAN: .    Assessment and Plan    Coronary Artery Disease (CAD) Follow-up for CAD requiring emergency bypass surgery and ascending aorta replacement. Ejection fraction improved to 50-55%  from 35% post-surgery. Condition is well-managed. - Continue current management.  Ischemic Cardiomyopathy Ischemic cardiomyopathy with improved ejection fraction at 50-55%. Condition is well-managed. - Continue current management.  Chronic Anticoagulation Therapy On chronic anticoagulation with Xarelto due to DVT and PE history. Biannual labs (hemoglobin, creatinine) are normal. - Continue Xarelto. - Continue biannual hemoglobin and creatinine labs.  Hyperlipidemia On simvastatin 40 mg without side effects. Plan to switch to pravastatin 20 mg and monitor labs in three months. Advised to  discontinue pravastatin if issues arise. - Start pravastatin 20 mg. - Monitor labs in three months. - Discontinue pravastatin if issues arise.  Mini Stroke (2021) and Seizures History of mini stroke in 2021 and two seizures. No current issues. - Continue current management.  General Health Maintenance Non-smoker. General health maintenance discussed. - Continue to avoid smoking.      Near syncope/syncope - Lets go ahead and stop the spironolactone 12.5 mg once a day.  Continue with losartan 25 mg a day as well as carvedilol 3.125 mg twice a day.  He has not taken his Patrick Fritz in about a month and a half.  Lets stay off of the [facility] for now given these episodes.  His monitor as above showed no adverse pauses. -If he is still having the symptoms, my neck step would be to stop the losartan 25 mg.         Signed, Donato Schultz, MD

## 2023-12-10 NOTE — Telephone Encounter (Signed)
 Since pt and wife were unsure of dose for Losartan, I had asked wife to double check and make [place] aware.  Pt is taking a total of 50 mg daily.  Medication list is correct.  Will forward to Dr Anne Fu for his knowledge.

## 2023-12-10 NOTE — Telephone Encounter (Signed)
 Wife called back to say the patient takes half of the 100mg  of the losartan medication. Please advise

## 2023-12-10 NOTE — Patient Instructions (Signed)
 Medication Instructions:  Please discontinue your Spironolactone. Continue all other medications as listed.   Please contact the office to verify how much Losartan you are taking.   *If you need a refill on your cardiac medications before your next appointment, please call your pharmacy*  Follow-Up: At [facility], you and your health needs are our priority.  As part of our continuing mission to provide you with exceptional heart care, we have created designated Provider Care Teams.  These Care Teams include your primary Cardiologist (physician) and Advanced Practice Providers (APPs -  Physician Assistants and Nurse Practitioners) who all work together to provide you with the care you need, when you need it.  We recommend signing up for the patient portal called "MyChart".  Sign up information is provided on this After Visit Summary.  MyChart is used to connect with patients for Virtual Visits (Telemedicine).  Patients are able to view lab/test results, encounter notes, upcoming appointments, etc.  Non-urgent messages can be sent to your provider as well.   To learn more about what you can do with MyChart, go to [email or web address].    Your next appointment:   3 month(s)  Provider:   Jari Favre, PA-C, Robin Searing, NP, Jacolyn Reedy, PA-C, Eligha Bridegroom, NP, Tereso Newcomer, PA-C, or Perlie Gold, PA-C            1st Floor: - Lobby - Registration  - Pharmacy  - Lab - Cafe  2nd Floor: - PV Lab - Diagnostic Testing (echo, CT, nuclear med)  3rd Floor: - Vacant  4th Floor: - TCTS (cardiothoracic surgery) - AFib Clinic - Structural Heart Clinic - Vascular Surgery  - Vascular Ultrasound  5th Floor: - HeartCare Cardiology (general and EP) - Clinical Pharmacy for coumadin, hypertension, lipid, weight-loss medications, and med management appointments    Valet parking services will be available as well.

## 2023-12-11 MED ORDER — LOSARTAN POTASSIUM 25 MG PO TABS: 25.0000 mg | ORAL_TABLET | Freq: Every day | ORAL | 3 refills | Status: AC

## 2023-12-11 NOTE — Telephone Encounter (Signed)
 Thanks Let's go down to 25mg  then on the losartan from 50mg  given his episodes  Stay off the [place] Just stopped the spironolactone 12.5  That leaves [place] with Coreg 3.125 bid and losartan 25mg   Donato Schultz, MD    Spoke with wife who is aware Dr Anne Fu has ordered pt to decrease to 25 mg a day.  New RX sent into [facility] as requested.

## 2023-12-28 ENCOUNTER — Other Ambulatory Visit: Payer: Self-pay | Admitting: Cardiology

## 2024-03-03 NOTE — Progress Notes (Signed)
 Cardiology Office Note:    Date:  03/04/2024  ID:  Armandina Bernard., DOB January 23, 1951, MRN [id number] PCP: Glena Landau, MD  [facility] Providers Cardiologist:  Dorothye Gathers, MD       Patient Profile:      Coronary artery disease  Inf STEMI in 04/2016 s/p PCI c/b cath induced dissection >> s/p CABG + replacement of ascending aorta HFimpEF (heart failure with improved ejection fraction)  Ischemic CM TTE 05/14/16: EF 55-60  TTE 05/19/16: EF 45-50 TTE 01/22/21: EF 30-35 TTE 10/03/21: EF 50-55, inf HK, mild LVH, Gr 2 DD, triival MR, mild AI, AV sclerosis, mild LAE  Hx of recurrent DVT, pulmonary embolism  Chronic anticoagulation  S/p TIA Syncope  Monitor (PCP) 10/2023: no sustained arrhythmias or pauses Hypertension  Hyperlipidemia   OSA  Hx of seizure           Discussed the use of AI scribe software for clinical note transcription with the patient, who gave verbal consent to proceed.  History of Present Illness Patrick Fritz. is a 73 y.o. male who returns for follow up of CAD, HTN. He was last seen by Dr. Renna Cary in 11/2023. He had had a syncopal episode. His PCP had obtained a Zio monitor that did not show any significant arrhythmias. He was already off of his Farxiga . Dr. Renna Cary stopped his Spironolactone . It was felt that his Losartan  could be stopped next if he continued to have symptoms.   He is here with his wife.  Since last seen, symptoms have improved, though he still experiences occasional dizziness, particularly when standing up from a crouched position. No recent episodes of syncope or passing out. The dizziness occurs a couple of times a week and resolves quickly. He manages it by holding onto something when standing up.  He has not had chest discomfort, heaviness, pressure, or tightness. He experiences shortness of breath occasionally, particularly when climbing stairs, but can reach the top without stopping, although he chooses to stop for safety. No  significant shortness of breath when lying flat. He notes swelling in his feet, which decreases when elevated    ROS-See HPI       Studies Reviewed:        Results LABS - Per [facility] HDL: 31 (09/04/2023) LDL: 80 (09/04/2023) Triglycerides: 163 (09/04/2023) Hb: 13 g/dL (40/98/1191)     Risk Assessment/Calculations:             Physical Exam:   VS:  BP 120/70   Pulse 68   Ht 5\' 10"  (1.778 m)   Wt 215 lb 6.4 oz (97.7 kg)   SpO2 98%   BMI 30.91 kg/m    Wt Readings from Last 3 Encounters:  03/04/24 215 lb 6.4 oz (97.7 kg)  12/10/23 218 lb 6.4 oz (99.1 kg)  12/03/23 219 lb (99.3 kg)    Constitutional:      Appearance: Healthy appearance. Not in distress.  Neck:     Vascular: JVD normal.  Pulmonary:     Breath sounds: Normal breath sounds. No wheezing. No rales.  Cardiovascular:     Normal rate. Regular rhythm.     Murmurs: There is no murmur.  Edema:    Peripheral edema present.    Ankle: trace edema of the right ankle. Abdominal:     Palpations: Abdomen is soft.        Assessment and Plan:   Assessment & Plan Coronary artery disease involving native coronary artery of native  heart without angina pectoris Status post inferior STEMI in 2017 with PCI complicated by catheter-induced dissection. Status post emergent CABG and replacement of ascending aorta. Currently asymptomatic for angina. Not on aspirin  due to Xarelto  therapy. - Continue simvastatin  40 mg daily - Continue carvedilol  3.125 mg twice daily - Continue losartan  25 mg daily Primary hypertension Orthostatic intolerance and near syncope/syncope improved with medication adjustments. Currently off spironolactone  and on reduced losartan . Minimal dizziness on standing. Advised on compression hose, slow positional changes, hydration, and compression socks during prolonged activity. - Continue carvedilol  3.125 mg twice daily - Continue losartan  25 mg daily - Advise use of compression hose - Advise getting up  slowly - Encourage hydration Pure hypercholesterolemia LDL was 80 in November 2024, above the goal of less than 70. Consider medication adjustment if LDL remains elevated. - Continue simvastatin  40 mg daily - Consider addition of ezetimibe if LDL remains above 70 Heart failure with improved ejection fraction (HFimpEF) (HCC) Ejection fraction improved from 30-35% in 2022 to 50-55% by December 2022 echocardiogram. NYHA class II. Volume status stable.  He is now off of Spironolactone  due to orthostatic intolerance. This is improved.  - Continue carvedilol  3.125 mg twice daily - Continue losartan  25 mg daily History of DVT in adulthood He remains on long-term anticoagulation with Xarelto .       Dispo:  Return in about 6 months (around 09/04/2024) for Routine Follow Up, w/ Dr. Renna Cary.  Signed, Marlyse Single, PA-C

## 2024-03-04 ENCOUNTER — Encounter: Payer: Self-pay | Admitting: Physician Assistant

## 2024-03-04 ENCOUNTER — Ambulatory Visit: Payer: Medicare Other | Attending: Physician Assistant | Admitting: Physician Assistant

## 2024-03-04 VITALS — BP 120/70 | HR 68 | Ht 70.0 in | Wt 215.4 lb

## 2024-03-04 DIAGNOSIS — I10 Essential (primary) hypertension: Secondary | ICD-10-CM | POA: Diagnosis not present

## 2024-03-04 DIAGNOSIS — I2510 Atherosclerotic heart disease of native coronary artery without angina pectoris: Secondary | ICD-10-CM | POA: Diagnosis not present

## 2024-03-04 DIAGNOSIS — E7800 Pure hypercholesterolemia, unspecified: Secondary | ICD-10-CM

## 2024-03-04 DIAGNOSIS — Z86718 Personal history of other venous thrombosis and embolism: Secondary | ICD-10-CM

## 2024-03-04 DIAGNOSIS — I5032 Chronic diastolic (congestive) heart failure: Secondary | ICD-10-CM | POA: Diagnosis not present

## 2024-03-04 NOTE — Assessment & Plan Note (Signed)
 He remains on long-term anticoagulation with Xarelto .

## 2024-03-04 NOTE — Patient Instructions (Addendum)
 Medication Instructions:  Your physician recommends that you continue on your current medications as directed. Please refer to the Current Medication list given to you today.  *If you need a refill on your cardiac medications before your next appointment, please call your pharmacy*  Lab Work: None ordered  If you have labs (blood work) drawn today and your tests are completely normal, you will receive your results only by: MyChart Message (if you have MyChart) OR A paper copy in the mail If you have any lab test that is abnormal or we need to change your treatment, we will call you to review the results.  Testing/Procedures: None ordered  Follow-Up: At [facility], you and your health needs are our priority.  As part of our continuing mission to provide you with exceptional heart care, our providers are all part of one team.  This team includes your primary Cardiologist (physician) and Advanced Practice Providers or APPs (Physician Assistants and Nurse Practitioners) who all work together to provide you with the care you need, when you need it.  Your next appointment:   6 month(s)  Provider:   Dorothye Gathers, MD or Marlyse Single, PA-C          We recommend signing up for the patient portal called "MyChart".  Sign up information is provided on this After Visit Summary.  MyChart is used to connect with patients for Virtual Visits (Telemedicine).  Patients are able to view lab/test results, encounter notes, upcoming appointments, etc.  Non-urgent messages can be sent to your provider as well.   To learn more about what you can do with MyChart, go to [email or web address].   Other Instructions Wear compression stockings when possible

## 2024-03-04 NOTE — Assessment & Plan Note (Signed)
 LDL was 80 in November 2024, above the goal of less than 70. Consider medication adjustment if LDL remains elevated. - Continue simvastatin  40 mg daily - Consider addition of ezetimibe if LDL remains above 70

## 2024-03-04 NOTE — Assessment & Plan Note (Signed)
 Status post inferior STEMI in 2017 with PCI complicated by catheter-induced dissection. Status post emergent CABG and replacement of ascending aorta. Currently asymptomatic for angina. Not on aspirin  due to Xarelto  therapy. - Continue simvastatin  40 mg daily - Continue carvedilol  3.125 mg twice daily - Continue losartan  25 mg daily

## 2024-03-04 NOTE — Assessment & Plan Note (Signed)
 Orthostatic intolerance and near syncope/syncope improved with medication adjustments. Currently off spironolactone  and on reduced losartan . Minimal dizziness on standing. Advised on compression hose, slow positional changes, hydration, and compression socks during prolonged activity. - Continue carvedilol  3.125 mg twice daily - Continue losartan  25 mg daily - Advise use of compression hose - Advise getting up slowly - Encourage hydration

## 2024-03-05 DIAGNOSIS — M47816 Spondylosis without myelopathy or radiculopathy, lumbar region: Secondary | ICD-10-CM | POA: Diagnosis not present

## 2024-05-28 DIAGNOSIS — M79641 Pain in right hand: Secondary | ICD-10-CM | POA: Diagnosis not present

## 2024-05-28 DIAGNOSIS — M47816 Spondylosis without myelopathy or radiculopathy, lumbar region: Secondary | ICD-10-CM | POA: Diagnosis not present

## 2024-05-28 DIAGNOSIS — G8929 Other chronic pain: Secondary | ICD-10-CM | POA: Diagnosis not present

## 2024-05-28 DIAGNOSIS — M19041 Primary osteoarthritis, right hand: Secondary | ICD-10-CM | POA: Diagnosis not present

## 2024-06-11 ENCOUNTER — Other Ambulatory Visit: Payer: Self-pay | Admitting: Adult Health

## 2024-06-12 ENCOUNTER — Other Ambulatory Visit: Payer: Self-pay | Admitting: Adult Health

## 2024-06-12 ENCOUNTER — Telehealth: Payer: Self-pay

## 2024-06-12 DIAGNOSIS — R569 Unspecified convulsions: Secondary | ICD-10-CM

## 2024-06-12 MED ORDER — BRIVIACT 50 MG PO TABS: 1.0000 | ORAL_TABLET | Freq: Two times a day (BID) | ORAL | 5 refills | Status: DC

## 2024-06-12 NOTE — Telephone Encounter (Signed)
 Pt's wife is asking for a call to confirm this medication will get filled for the pt today, he will not have enough to make it thru the weekend, please call

## 2024-06-12 NOTE — Telephone Encounter (Signed)
 Spoke w/Pt wife to make her aware the refill request has been sent for provider approval which should be done today.

## 2024-06-19 ENCOUNTER — Telehealth: Payer: Self-pay | Admitting: Adult Health

## 2024-06-19 NOTE — Progress Notes (Signed)
 [facility] [address] [place]. [place] [address] [phone]       OFFICE FOLLOW UP NOTE  [name]. Date of Birth:  1951-01-07 Medical Record Number:  [id number]   Reason for visit: Seizure and stroke follow up    SUBJECTIVE:   CHIEF COMPLAINT:  Chief Complaint  Patient presents with   Seizures    Rm 3 with spouse Pt is well and stable, reports no new stroke concerns.  Spouse does reports ongoing presyncopal episodes, has had two episodes since last visit.     HPI:   Update 06/23/2024 JM: Patient returns for 11-month follow-up visit accompanied by his wife. Overall stable from neurological standpoint.  He has since switched from Keppra  to Briviact  and has been tolerating well without side effects.  Wife does note partial type seizures where he moves his mouth like he wants to talk but cannot get anything out and has a blank stare that last for 1 to 2 minutes and then returns to baseline without any postictal symptoms.  He is not aware of these events happening.  Wife reports 2 of these episodes in the past 6 months with prior episode about 2 months ago. No new stroke/TIA symptoms.  Reports compliance on Xarelto  and simvastatin .  Routinely follows with PCP Dr. Loreli for stroke risk factor management. Does monitor blood pressure at home and typically stable. Previously reported presyncopal episodes, was seen by cardiology and symptoms improved after medication adjustments. Recommended f/u visit in November (last seen 02/2024).  No questions or concerns at this time.     History provided for reference purposes only Update 12/03/2023 JM: Patient returns for 51-month follow-up accompanied by his wife who provides majority of history.  Wife reports episodes of presyncope over the past 3 months, only 1 actual syncopal event, typically will feel a sensation of lightheadedness, and sometimes tunnel vision, is able to grab on to an object or sit down and  symptoms resolve in less than 1 minute. Is aware throughout and remembers episodes. Can occur about once per week. Usually occurs when going from sitting to standing or while walking. No confusion or fatigue after or any seizure-like activity. Was recently discussed with PCP who completed lab work and cardiac monitor, lab work satisfactory, cardiac monitor normal per wife (unable to view via epic), has f/u next week with cardiology.  Wife also mentions occasional episodes of abnormal mouth movements, does not respond to wife, only lasts for a few seconds, patient does not remember this occurring. No postictal type symptoms.  At prior visit, he was switched from Keppra  XR to Briviact  due to continued mood concerns.  Insurance initially denied Briviact  but was approved upon appeal but was never picked up from pharmacy. He ran out of Keppra  XR prescription about 3 weeks ago, wife found old prescription of Keppra  IR 500mg  tablets and he started taking that again mid last week after wife felt he was not acting right one day (just seemed off)but no actual seizure activity. Wife did note some possible improvement of mood while off keppra , they are still interested in pursuing Briviact .  Otherwise, stable from stroke standpoint without new stroke/TIA symptoms.  He does report poor short-term memory which is not new.  Remains on Xarelto  and simvastatin .  Routinely follows with PCP for stroke risk factor management.  Update 05/16/2023 JM: Patient returns for follow-up visit accompanied by his wife.  Denies any recurrent seizure activity.  Was switched from Keppra  IR 500mg   BID to Keppra  XR 1000mg  daily at prior visit due to changes in mood but no significant improvement. Wife does note since changing, he has been taking longer to get going in the morning.   Denies any new stroke/TIA symptoms.  Compliant on medications.  Routinely follows with PCP Dr. Loreli as well as [facility]   Reports  continued hand pain, right greater than left, routinely followed with rheumatology for rheumatoid arthritis.  Started on methotrexate 6 weeks ago and has follow-up next month.  He does note bilateral fingertip numbness/tingling, at times can have difficulty holding objects. Per wife, [place] spoke with him about possibly needing neuropathy workup.  Update 11/15/2022 JM: Patient returns for 83-month stroke and seizure follow-up accompanied by his wife.  Reports episode back in December after they recently moved and unpacking, wife asked him a question and he responded with I don't know, I don't understand and then was unable to speak for 1-2 minutes after and then returned back to baseline. No other symptoms. Did not lose consciousness, no postictal symptoms. Similar event occurred in November while in [employer]. No additional events since then. Reports compliance on Keppra  500 mg twice daily. Patient reports increased stressors as they care for their grandchildren and is constantly worrying about them. Wife mentions that he has been more irritable, questions if this is from medications.    At prior visit, complained of coughing after swallowing foods and liquids, order placed for MBS but did not pursue as symptoms resolved, has not had any issues with this since then.   Denies any new stroke/TIA symptoms. Compliant on Xarelto  and simvastatin .  Blood pressure well-controlled.  [facility]   Update 05/09/2022 JM: Patient returns for 30-month seizure and stroke follow-up accompanied by his wife.   Has been stable since prior visit without any additional seizure activity.  Remains on Keppra  500 mg twice daily, denies side effects.  Completed MRI which did not show any new or acute findings but did note redemonstrated small chronic cortical/subcortical infarct within the mid right frontal lobe which could reflect a seizure focus.  He does note over the past couple of months coughing after  swallowing food and liquids. At times, can cough so bad he will vomit. Can occur with any type of food and drink.  Does not have difficulty initiating swallowing.  Denies any issues with swallowing in the past or hx of esophageal issues.  Denies any issues with acid reflux or history of acid reflux.  Denies any new or reoccurring stroke/TIA symptoms Compliant on Xarelto  and simvastatin , denies side effects Blood pressure today 112/58  Reports continued left hand pain and at times right hand. He is being seen by a rheumatologist through [facility] for questionable rheumatoid arthritis who recently started him on colchicine for possible acute gout flare with some improvement of pain, has f/u in a couple weeks. He also notes chronic back pain and is scheduled to see Dr. Colon this week.  Completed sleep study 02/2022 with Dr. Shlomo which showed mild OSA with total AHI 13/h and recommended therapeutic CPAP titration.    No further concerns at this time  Update 01/02/2022 JM: Patient returns due to recent seizure activity and hospital follow-up.  He is accompanied by his wife.  He has not been seen in over 10 months although requested 6 mo follow up after he was seen for TIA versus seizure activity.  He presented to ED on 3/16 with seizure-like activity and recurrent  tonic-clonic seizure in ED receiving IV Ativan  and IV Keppra  load. Evaluated by neurologist Dr. Michaela.  He was started on Keppra  500 mg twice daily. EEG mild diffuse encephalopathy but no seizures or epileptiform discharges.  Planned on completing MR brain outpatient and discharged home on 3/18.  Of note, back in 06/2021, wife called office reporting episode of his hands starting to jerk, head fell back and unable to speak for 1-2 minutes. He did not follow up as recommended.   He has been stable since discharge, no additional seizure activity. Does report some generalized fatigue since discharge but did have some fatigue prior due to chronic  low back pain. Compliant on Keppra  500 mg twice daily, denies side effects. He has not yet completed MRI - wife was under the impression this would be done today at appointment. Wife asks multiple appropriate questions regarding recent events and prior imaging.   Initial visit 02/22/2021 JM: Mr. Winrow is being seen for hospital follow-up accompanied by his wife, Slater  Doing well from stroke standpoint without new or reoccurring symptoms.  He does report occasional short-term memory difficulty or delayed recall but this has been on going C/o intermittent dizziness with one episode of passing out -cardiology aware who felt likely due to slight orthostasis with recent medication adjustments Also reports right leg calf and thigh pain as well as swelling that has been improving. Per wife, cardiology evaluated yesterday and no emergent concerns reported - defer ongoing monitoring to cardiology  Compliant on Xarelto  and simvastatin  without associated side effects Blood pressure today 114/67 - occasionally monitors at home   No further concerns at this time  Hospitalization 01/22/2021 Mr. TRAPPER MEECH is a 73 y.o. male with history of of DJD of lumbar spine, frequent lower back pain, history of bilateral PE, history of DVT, glaucoma, hyperlipidemia, hypertension, impaired fasting glucose, OSA no longer on BiPAP, CAD/CABG (per patient required defibrillation during CABG) who presented  on 01/22/2021 with AMS and recent episode with dysarthria. Patient symptoms resolved by the time he presented to the ED and his AMS improved on his initial exam.  Personally reviewed hospitalization pertinent progress notes, lab work and imaging with summary provided.  CT head and MRI negative for acute stroke and symptoms likely in setting of TIA vs possible unwitnessed seizure with postictal confusion as symptoms lasted for approximately 2-2.5hrs and pt unable to recall episode.  EEG negative.  Recommended continuation of Xarelto   for secondary stroke prevention with history of DVT and PE.  2D echo mild worsening of HFrEF from 40 to 45% in 2017 to now 30 to 35% -recommended outpatient follow-up with established cardiologist.  LDL 55.6 on simvastatin .  Evaluated by therapy and discharged home in stable condition without therapy needs.  Posterior circulation TIA vs unwitnessed seizure with post ictal presentation Code Stroke : CT head No acute abnormality. Small vessel disease. ASPECTS 10.  CTA head & neck: No high grade stenosis of the intracranial arteries and no Hemodynamically significant stenosis of bilateral carotids CT perfusion: negative MRI : No acute findings, chronic small vessel dx and small remote frontal lobe infarct 2D Echo: LVEF 30-35%, LV regional wall abnormalities (septal and inferior wall hypokinesis), Mild AVR, L atrium mildy enlarged, Mild MVR, EEG no evidence of seizure activity LDL 55.6 HgbA1c 5.6 VTE prophylaxis - SCDs aspirin  81 mg daily and Xarelto  (rivaroxaban ) daily prior to admission will restart ,Xarelto  (rivaroxaban ) daily.  Additional aspirin  not necessary from a stroke standpoint Therapy recommendations: none Disposition:  Home     ROS:   14 system review of systems performed and negative with exception of those listed in HPI  PMH:  Past Medical History:  Diagnosis Date   Cancer (HCC)    skin   DJD (degenerative joint disease), lumbar    DVT (deep venous thrombosis) (HCC)    Dysrhythmia    Family history of adverse reaction to anesthesia    Glaucoma    R eye diminished vision approx 50%   High cholesterol    Hyperlipidemia    Hypertension    IFG (impaired fasting glucose)    OSA (obstructive sleep apnea)    Severe w AHI 34/hr on HST no on BiPAP at 19/15cm H2O   PE (pulmonary embolism)    Second occurance,enknown etiology,lifelong anticoagulation    PSH:  Past Surgical History:  Procedure Laterality Date   CARDIAC CATHETERIZATION N/A 05/14/2016   Procedure: Left  Heart Cath and Coronary Angiography;  Surgeon: Victory LELON Sharps, MD;  Location: [facility] INVASIVE CV LAB;  Service: Cardiovascular;  Laterality: N/A;   CORONARY ARTERY BYPASS GRAFT N/A 05/14/2016   Procedure: CORONARY ARTERY BYPASS GRAFTING (CABG) times two with LIMA to LAD and right leg SVG to PDA,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;  Surgeon: Maude Fleeta Ochoa, MD;  Location: [facility] OR;  Service: Open Heart Surgery;  Laterality: N/A;   EXCISIONAL TOTAL KNEE ARTHROPLASTY WITH ANTIBIOTIC SPACERS Right 05/26/2021   Procedure: EXCISIONAL TOTAL KNEE ARTHROPLASTY WITH ANTIBIOTIC SPACERS;  Surgeon: Fidel Rogue, MD;  Location: [facility] ORS;  Service: Orthopedics;  Laterality: Right;   KNEE SURGERY Bilateral    arthroscopic   TOTAL KNEE ARTHROPLASTY Bilateral 1996   TOTAL KNEE REVISION Right 09/22/2021   Procedure: RIGHT KNEE REMOVAL SPACER TOTAL KNEE REVISION;  Surgeon: Fidel Rogue, MD;  Location: [facility] ORS;  Service: Orthopedics;  Laterality: Right;  210    Social History:  Social History   Socioeconomic History   Marital status: Married    Spouse name: Not on file   Number of children: Not on file   Years of education: Not on file   Highest education level: Not on file  Occupational History   Not on file  Tobacco Use   Smoking status: Never   Smokeless tobacco: Never  Vaping Use   Vaping status: Never Used  Substance and Sexual Activity   Alcohol  use: Yes    Comment: 1-2 per week   Drug use: Never   Sexual activity: Not on file  Other Topics Concern   Not on file  Social History Narrative   Not on file   Social Drivers of Health   Financial Resource Strain: Not on file  Food Insecurity: Not on file  Transportation Needs: Not on file  Physical Activity: Not on file  Stress: Not on file  Social Connections: Not on file  Intimate Partner Violence: Not on file    Family History:  Family History  Problem Relation Age of Onset   Osteoporosis Mother    Hypertension Father    CAD  Father     Medications:   Current Outpatient Medications on File Prior to Visit  Medication Sig Dispense Refill   acetaminophen  (TYLENOL ) 650 MG CR tablet Take 650 mg by mouth every 8 (eight) hours as needed for pain.     albuterol (VENTOLIN HFA) 108 (90 Base) MCG/ACT inhaler SMARTSIG:1 Puff(s) By Mouth Every 6 Hours PRN     allopurinol  (ZYLOPRIM ) 300 MG tablet Take 300 mg by mouth daily.  Brivaracetam  (BRIVIACT ) 50 MG TABS Take 1 tablet by mouth 2 (two) times daily. 60 tablet 5   buprenorphine (BUTRANS) 10 MCG/HR PTWK 1 patch once a week.     carvedilol  (COREG ) 3.125 MG tablet Take 1 tablet by mouth twice daily 180 tablet 3   Cetirizine HCl (ZYRTEC ALLERGY) 10 MG CAPS as needed (allergies).     diclofenac Sodium (VOLTAREN) 1 % GEL as needed (pain).     dorzolamide-timolol (COSOPT) 2-0.5 % ophthalmic solution 2 (two) times daily.     folic acid  (FOLVITE ) 1 MG tablet Take by mouth.     ketotifen (ZADITOR) 0.035 % ophthalmic solution Apply to eye.     losartan  (COZAAR ) 25 MG tablet Take 1 tablet (25 mg total) by mouth daily. 90 tablet 3   methotrexate (RHEUMATREX) 2.5 MG tablet Take 2.5 mg by mouth once a week. (Patient taking differently: Take by mouth once a week. 4 tab AM, 4 PM)     Multiple Vitamins-Minerals (PRESERVISION AREDS 2) CAPS 2 (two) times daily.     rivaroxaban  (XARELTO ) 20 MG TABS tablet Take 1 tablet (20 mg total) by mouth daily with supper. 30 tablet 2   sertraline  (ZOLOFT ) 100 MG tablet Take 150 mg by mouth daily.     simvastatin  (ZOCOR ) 40 MG tablet Take 1 tablet (40 mg total) by mouth at bedtime. 90 tablet 3   No current facility-administered medications on file prior to visit.    Allergies:   Allergies  Allergen Reactions   Penicillins Anaphylaxis    Has patient had a PCN reaction causing immediate rash, facial/tongue/throat swelling, SOB or lightheadedness with hypotension: Yes  Has patient had a PCN reaction causing severe rash involving mucus membranes or  skin necrosis: No  Has patient had a PCN reaction that required hospitalization Yes  Has patient had a PCN reaction occurring within the last 10 years: No  If all of the above answers are NO, then may proceed with Cephalosporin use.  Has patient had a PCN reaction causing immediate rash, facial/tongue/throat swelling, SOB or lightheadedness with hypotension: Yes, Has patient had a PCN reaction causing severe rash involving mucus membranes or skin necrosis: No, Has patient had a PCN reaction that required hospitalization Yes, Has patient had a PCN reaction occurring within the last 10 years: No, If all of the above answers are NO, then may proceed with Cephalosporin use.   Oxycodone  Nausea Only       OBJECTIVE:  Physical Exam  Vitals:   06/23/24 1126  BP: (!) 146/89  Pulse: 89  Weight: 215 lb (97.5 kg)  Height: 5' 10 (1.778 m)   Body mass index is 30.85 kg/m. No results found.  General: well developed, well nourished, pleasant elderly Caucasian male, seated, in no evident distress  Neurologic Exam Mental Status: Awake and fully alert.  Fluent speech and language.  Oriented to place and time. Recent memory subjectively mildly impaired and remote memory intact. Attention span, concentration and fund of knowledge appropriate although wife tends to provide history. Mood and affect appropriate.  Cranial Nerves: Pupils equal, briskly reactive to light. Extraocular movements full without nystagmus. Visual fields full to confrontation. Hearing intact. Facial sensation intact. Very slight left facial asymmetry. tongue, palate moves normally and symmetrically.  Motor: Normal bulk and tone. Normal strength in all tested extremity muscles  Sensory.: intact to touch , pinprick , position and vibratory sensation.  Coordination: Rapid alternating movements normal in all extremities slightly decreased left hand. Finger-to-nose and heel-to-shin performed  accurately bilaterally.  Gait and  Station: Arises from chair without difficulty. Stance is slightly hunched. Gait demonstrates slow cautious steps with decreased stride length and step height without use of assistive device.  Tandem walk and heel toe not attempted due to safety concerns Reflexes: 1+ and symmetric. Toes downgoing.          ASSESSMENT: Patrick Fritz. is a 73 y.o. year old male with posterior circulation TIA vs unwitnessed seizure with postictal state on 01/22/2021 after presenting with altered mental status and dysarthria.  Witnessed generalized tonic-clonic seizure 12/29/2021 possibly symptomatic from old left frontal stroke as seen on [facility].  Vascular risk factors include HLD, hx of PE and DVT on Xarelto , multiple prior strokes on imaging, HTN, OSA not on BiPAP, CHF and CAD s/p CABG.      PLAN:  Seizure likely from prior stroke:  Intermittent oral automatisms with abnormal jaw movements and verbally unresponsive - possibly focal seizure - occurred 2x over the past 6 months, last episode 2 months ago. Advised to continue to monitor, will hold off for now on medication adjustments as stable over the past 2 months but can consider adjustment of ASM if needed Continue Briviact  50 mg twice daily -refill up-to-date. Will send in updated refill at end of the month requesting 90 day fill per wife request Intolerant to Keppra  due to mood change side effects Prior event in Nov and Dec 2023 which consisted of disorientation and speech arrest for 1-2 minutes possibly in setting of focal seizure but patient/family declined adjustment in keprpa dosage. EEG 12/29/2021 diffuse encephalopathy, no seizures or epileptiform discharges  MRI brain 12/2021 no acute findings, high cortical/subcortical infarct within the mid right frontal lobe which could reflect a seizure focus as well as chronic small vessel Shikhman changes stable since prior imaging, redemonstrated chronic infarcts in the right BG and left cerebral hemisphere and mild  generalized cerebral atrophy MR brain 01/2021 chronic right frontal infarct, R BG lacunes, left cerebellar infarct and remote microhemorrhages Discussed avoidance of seizure provoking triggers/activity Advised to call office with any recurrent seizure activity  Hx of multiple strokes (per imaging, ?silent strokes) Continue current treatment regimen with Xarelto  (hx of PE/DVT) and simvastatin  managed by PCP Continue close PCP follow-up for aggressive stroke risk factor management including HTN with BP goal<130/90 and HLD with LDL goal<70     Follow up in 1 year or call earlier if needed    CC:  PCP: Loreli Kins, MD    I personally spent a total of 30 minutes in the care of the patient today including preparing to see the patient, performing a medically appropriate exam/evaluation, counseling and educating, and documenting clinical information in the EHR.   Harlene Bogaert, AGNP-BC  [facility] [address] [place], [place] [address]  Phone [phone] Fax [phone] Note: This document was prepared with digital dictation and possible smart phrase technology. Any transcriptional errors that result from this process are unintentional.

## 2024-06-19 NOTE — Telephone Encounter (Signed)
 MYC conf

## 2024-06-23 ENCOUNTER — Encounter: Payer: Self-pay | Admitting: Adult Health

## 2024-06-23 ENCOUNTER — Ambulatory Visit (INDEPENDENT_AMBULATORY_CARE_PROVIDER_SITE_OTHER): Payer: Medicare Other | Admitting: Adult Health

## 2024-06-23 VITALS — BP 146/89 | HR 89 | Ht 70.0 in | Wt 215.0 lb

## 2024-06-23 DIAGNOSIS — R569 Unspecified convulsions: Secondary | ICD-10-CM | POA: Diagnosis not present

## 2024-06-23 DIAGNOSIS — Z8673 Personal history of transient ischemic attack (TIA), and cerebral infarction without residual deficits: Secondary | ICD-10-CM

## 2024-06-23 DIAGNOSIS — I69398 Other sequelae of cerebral infarction: Secondary | ICD-10-CM | POA: Diagnosis not present

## 2024-06-23 NOTE — Patient Instructions (Addendum)
 Your Plan:  Continue Briviact  for seizure prevention - please let me know if smaller seizures become more frequent, may need to consider increasing dosage   Continue close PCP follow-up for ongoing stroke risk factor management  Continue to follow with cardiology for presyncopal episodes and further medication adjustments if needed      Follow-up in 1 year or call earlier if needed      Thank you for coming to see us  at [facility]. I hope we have been able to provide you high quality care today.  You may receive a patient satisfaction survey over the next few weeks. We would appreciate your feedback and comments so that we may continue to improve ourselves and the health of our patients.

## 2024-08-07 MED ORDER — BRIVIACT 50 MG PO TABS
1.0000 | ORAL_TABLET | Freq: Two times a day (BID) | ORAL | 1 refills | Status: AC
Start: 2024-08-07 — End: ?

## 2024-08-07 NOTE — Addendum Note (Signed)
 Addended by: WHITFIELD RAISIN L on: 08/07/2024 09:54 AM   Modules accepted: Orders

## 2024-09-03 LAB — LAB REPORT - SCANNED
A1c: 6
Albumin, Urine POC: 1.09
Creatinine, POC: 86 mg/dL
EGFR: 91
Microalb Creat Ratio: 12.7

## 2025-06-30 ENCOUNTER — Ambulatory Visit: Admitting: Adult Health
